# Patient Record
Sex: Female | Born: 1950
Health system: Southern US, Community
[De-identification: ages and names within clinical notes are randomized; demographics above are authoritative.]

## PROBLEM LIST (undated history)

## (undated) DIAGNOSIS — W540XXA Bitten by dog, initial encounter: Secondary | ICD-10-CM

## (undated) DIAGNOSIS — M8580 Other specified disorders of bone density and structure, unspecified site: Secondary | ICD-10-CM

## (undated) DIAGNOSIS — N12 Tubulo-interstitial nephritis, not specified as acute or chronic: Secondary | ICD-10-CM

## (undated) DIAGNOSIS — Z87442 Personal history of urinary calculi: Secondary | ICD-10-CM

## (undated) DIAGNOSIS — N2889 Other specified disorders of kidney and ureter: Secondary | ICD-10-CM

## (undated) DIAGNOSIS — S61559A Open bite of unspecified wrist, initial encounter: Secondary | ICD-10-CM

## (undated) DIAGNOSIS — Z923 Personal history of irradiation: Secondary | ICD-10-CM

## (undated) DIAGNOSIS — C419 Malignant neoplasm of bone and articular cartilage, unspecified: Secondary | ICD-10-CM

## (undated) DIAGNOSIS — N189 Chronic kidney disease, unspecified: Secondary | ICD-10-CM

## (undated) DIAGNOSIS — N390 Urinary tract infection, site not specified: Secondary | ICD-10-CM

## (undated) DIAGNOSIS — N159 Renal tubulo-interstitial disease, unspecified: Secondary | ICD-10-CM

## (undated) DIAGNOSIS — IMO0001 Reserved for inherently not codable concepts without codable children: Secondary | ICD-10-CM

## (undated) DIAGNOSIS — C3490 Malignant neoplasm of unspecified part of unspecified bronchus or lung: Secondary | ICD-10-CM

## (undated) DIAGNOSIS — M1990 Unspecified osteoarthritis, unspecified site: Secondary | ICD-10-CM

## (undated) HISTORY — PX: FINGER SURGERY: SHX640

## (undated) HISTORY — DX: Chronic kidney disease, unspecified: N18.9

## (undated) HISTORY — PX: KIDNEY STONE SURGERY: SHX686

---

## 2005-11-14 HISTORY — PX: LITHOTRIPSY: SUR834

## 2006-09-19 ENCOUNTER — Ambulatory Visit (HOSPITAL_COMMUNITY): Admission: RE | Admit: 2006-09-19 | Discharge: 2006-09-19 | Payer: Self-pay | Admitting: Urology

## 2006-10-26 ENCOUNTER — Ambulatory Visit (HOSPITAL_COMMUNITY): Admission: RE | Admit: 2006-10-26 | Discharge: 2006-10-26 | Payer: Self-pay | Admitting: Urology

## 2010-08-11 ENCOUNTER — Ambulatory Visit (HOSPITAL_BASED_OUTPATIENT_CLINIC_OR_DEPARTMENT_OTHER): Admission: RE | Admit: 2010-08-11 | Discharge: 2010-08-11 | Payer: Self-pay | Admitting: Urology

## 2011-01-27 LAB — POCT HEMOGLOBIN-HEMACUE: Hemoglobin: 14.4 g/dL (ref 12.0–15.0)

## 2012-08-16 ENCOUNTER — Telehealth: Payer: Self-pay

## 2012-08-16 NOTE — Telephone Encounter (Signed)
FORMER PT OF Alison Sullivan.  SHE IN THE PAST HAD CHRONIC UTI'S.  FOR A WHILE Alison WOULD PRESCRIBE HER AN ANTIBIOTIC TO TAKE WITH HER WHEN SHE WENT OUT OF TOWN JUST IN CASE SHE WOULD NEED ONE.  SHE IS GOING OUT OF THE COUNTRY IN TWO WEEKS AND WOULD LIKE TO KNOW IF SHE COULD GET ONE TO TAKE WITH HER AGAIN.  SAYS HER LAST COUPLE OF TRIPS SHE DIDN'T EVEN NEED THE PRESCRIPTIONS, BUT NOW THEY ARE OUTDATED.  ALSO SHE WOULD LIKE TO KNOW WHAT IS NECESSARY TO GET A PRESCRIPTION FOR A SHINGLE SHOT.  USES [facility] [address].  [phone]

## 2012-08-16 NOTE — Telephone Encounter (Signed)
Please pull chart.

## 2012-08-16 NOTE — Telephone Encounter (Signed)
I will pull chart, patient not in since we have changed to Epic, She is requesting ABX for travel to prevent UTI, also she is requesting order for shingles vaccine.

## 2012-08-17 NOTE — Telephone Encounter (Signed)
Chart in pile at [facility] desk

## 2012-08-17 NOTE — Telephone Encounter (Signed)
This patient hasn't been seen here in 2 years. Needs an OV for any type of medical treatment, including Rxs.

## 2012-08-17 NOTE — Telephone Encounter (Signed)
Explained to pt that we will need to see her for a check-up to make sure she is healthy in order to prescribe any Rxs for her. Pt stated that it was only an Abx and she didn't have time for an OV. Again, I explained that it is not safe/good medical practice for our providers to Rx anything for her when it has been so long since she has been evaluated.

## 2012-08-28 ENCOUNTER — Encounter: Payer: Self-pay | Admitting: Family Medicine

## 2012-11-20 ENCOUNTER — Ambulatory Visit: Payer: BC Managed Care – PPO | Admitting: Internal Medicine

## 2012-11-20 ENCOUNTER — Ambulatory Visit: Payer: BC Managed Care – PPO

## 2012-11-20 VITALS — BP 112/76 | HR 62 | Temp 97.6°F | Resp 16 | Ht 67.5 in | Wt 134.0 lb

## 2012-11-20 DIAGNOSIS — T148XXA Other injury of unspecified body region, initial encounter: Secondary | ICD-10-CM

## 2012-11-20 DIAGNOSIS — M25539 Pain in unspecified wrist: Secondary | ICD-10-CM

## 2012-11-20 DIAGNOSIS — W540XXA Bitten by dog, initial encounter: Secondary | ICD-10-CM

## 2012-11-20 DIAGNOSIS — S61531A Puncture wound without foreign body of right wrist, initial encounter: Secondary | ICD-10-CM

## 2012-11-20 DIAGNOSIS — S61509A Unspecified open wound of unspecified wrist, initial encounter: Secondary | ICD-10-CM

## 2012-11-20 DIAGNOSIS — Z23 Encounter for immunization: Secondary | ICD-10-CM

## 2012-11-20 MED ORDER — AMOXICILLIN-POT CLAVULANATE 875-125 MG PO TABS: 1.0000 | ORAL_TABLET | Freq: Two times a day (BID) | ORAL | Status: DC

## 2012-11-20 MED ORDER — HYDROCODONE-ACETAMINOPHEN 5-325 MG PO TABS: 1.0000 | ORAL_TABLET | Freq: Four times a day (QID) | ORAL | Status: DC | PRN

## 2012-11-20 NOTE — Progress Notes (Signed)
  Subjective:    Patient ID: Alison Sullivan, female    DOB: 1951-06-20, 62 y.o.   MRN: [id number]  HPI [place] [address], dog hit by a car, seriously injured and when she helped dog out of street dog bit her wrist. Wounds on top and bottom and both bled profusely. Dog utd on shots, dog died from wounds Wrist and hand has full function TD will be given/had tdap 5 yrs ago   Review of Systems healthy    Objective:   Physical Exam  Vitals reviewed. Constitutional: She is oriented to person, place, and time. She appears well-developed and well-nourished. No distress.  Pulmonary/Chest: Effort normal.  Musculoskeletal: She exhibits edema and tenderness.       Right wrist: She exhibits tenderness, swelling and laceration. She exhibits normal range of motion and no bony tenderness.       Arms: Neurological: She is alert and oriented to person, place, and time. No cranial nerve deficit or sensory deficit. She exhibits normal muscle tone. Coordination normal.  Skin: Abrasion, bruising, ecchymosis and laceration noted. There is erythema.     Psychiatric: She has a normal mood and affect.          Assessment & Plan:  Contusion wrist Wounds doral and volar wrist. Wrist splint Augmentin 875/HC

## 2012-11-20 NOTE — Patient Instructions (Signed)
Animal Bite An animal bite can result in a scratch on the skin, deep open cut, puncture of the skin, crush injury, or tearing away of the skin or a body part. Dogs are responsible for most animal bites. Children are bitten more often than adults. An animal bite can range from very mild to more serious. A small bite from your house pet is no cause for alarm. However, some animal bites can become infected or injure a bone or other tissue. You must seek medical care if:  The skin is broken and bleeding does not slow down or stop after 15 minutes.  The puncture is deep and difficult to clean (such as a cat bite).  Pain, warmth, redness, or pus develops around the wound.  The bite is from a stray animal or rodent. There may be a risk of rabies infection.  The bite is from a snake, raccoon, skunk, fox, coyote, or bat. There may be a risk of rabies infection.  The person bitten has a chronic illness such as diabetes, liver disease, or cancer, or the person takes medicine that lowers the immune system.  There is concern about the location and severity of the bite. It is important to clean and protect an animal bite wound right away to prevent infection. Follow these steps:  Clean the wound with plenty of water and soap.  Apply an antibiotic cream.  Apply gentle pressure over the wound with a clean towel or gauze to slow or stop bleeding.  Elevate the affected area above the heart to help stop any bleeding.  Seek medical care. Getting medical care within 8 hours of the animal bite leads to the best possible outcome. DIAGNOSIS  Your caregiver will most likely:  Take a detailed history of the animal and the bite injury.  Perform a wound exam.  Take your medical history. Blood tests or X-rays may be performed. Sometimes, infected bite wounds are cultured and sent to a lab to identify the infectious bacteria.  TREATMENT  Medical treatment will depend on the location and type of animal bite as  well as the patient's medical history. Treatment may include:  Wound care, such as cleaning and flushing the wound with saline solution, bandaging, and elevating the affected area.  Antibiotics.  Tetanus immunization.  Rabies immunization.  Leaving the wound open to heal. This is often done with animal bites, due to the high risk of infection. However, in certain cases, wound closure with stitches, wound adhesive, skin adhesive strips, or staples may be used. Infected bites that are left untreated may require intravenous (IV) antibiotics and surgical treatment in the hospital. HOME CARE INSTRUCTIONS  Follow your caregiver's instructions for wound care.  Take all medicines as directed.  If your caregiver prescribes antibiotics, take them as directed. Finish them even if you start to feel better.  Follow up with your caregiver for further exams or immunizations as directed. You may need a tetanus shot if:  You cannot remember when you had your last tetanus shot.  You have never had a tetanus shot.  The injury broke your skin. If you get a tetanus shot, your arm may swell, get red, and feel warm to the touch. This is common and not a problem. If you need a tetanus shot and you choose not to have one, there is a rare chance of getting tetanus. Sickness from tetanus can be serious. SEEK MEDICAL CARE IF:  You notice warmth, redness, soreness, swelling, pus discharge, or a bad   smell coming from the wound.  You have a red line on the skin coming from the wound.  You have a fever, chills, or a general ill feeling.  You have nausea or vomiting.  You have continued or worsening pain.  You have trouble moving the injured part.  You have other questions or concerns. MAKE SURE YOU:  Understand these instructions.  Will watch your condition.  Will get help right away if you are not doing well or get worse. Document Released: 07-19-202012 Document Revised: 01/23/2012 Document  Reviewed: 07-19-202012 [facility] Patient Information 2013 [place], [place]. Wound Care Wound care helps prevent pain and infection.  You may need a tetanus shot if:  You cannot remember when you had your last tetanus shot.  You have never had a tetanus shot.  The injury broke your skin. If you need a tetanus shot and you choose not to have one, you may get tetanus. Sickness from tetanus can be serious. HOME CARE   Only take medicine as told by your doctor.  Clean the wound daily with mild soap and water.  Change any bandages (dressings) as told by your doctor.  Put medicated cream and a bandage on the wound as told by your doctor.  Change the bandage if it gets wet, dirty, or starts to smell.  Take showers. Do not take baths, swim, or do anything that puts your wound under water.  Rest and raise (elevate) the wound until the pain and puffiness (swelling) are better.  Keep all doctor visits as told. GET HELP RIGHT AWAY IF:   Yellowish-white fluid (pus) comes from the wound.  Medicine does not lessen your pain.  There is a red streak going away from the wound.  You have a fever. MAKE SURE YOU:   Understand these instructions.  Will watch your condition.  Will get help right away if you are not doing well or get worse. Document Released: 08/09/2008 Document Revised: 01/23/2012 Document Reviewed: 03/06/2011 [facility] Patient Information 2013 [place], [place].

## 2012-11-20 NOTE — Progress Notes (Signed)
VCO. Wound cleaned with soap and water.  Superficial wound to right wrist. Edges loosely approximated with #2 steri-strips. Bandaged and splint applied. Patient tolerated well.

## 2012-11-22 NOTE — Progress Notes (Signed)
  Subjective:    Patient ID: Alison Sullivan, female    DOB: 03-08-1951, 62 y.o.   MRN: [id number]  HPI    Review of Systems     Objective:   Physical Exam   [facility] reading (PRIMARY) by  Dr.Azlee Monforte NAD.       Assessment & Plan:

## 2013-08-11 ENCOUNTER — Ambulatory Visit (INDEPENDENT_AMBULATORY_CARE_PROVIDER_SITE_OTHER): Payer: BC Managed Care – PPO | Admitting: Internal Medicine

## 2013-08-11 VITALS — BP 116/80 | HR 68 | Temp 97.7°F | Resp 18 | Ht 66.75 in | Wt 140.6 lb

## 2013-08-11 DIAGNOSIS — N200 Calculus of kidney: Secondary | ICD-10-CM

## 2013-08-11 DIAGNOSIS — N390 Urinary tract infection, site not specified: Secondary | ICD-10-CM

## 2013-08-11 DIAGNOSIS — Z23 Encounter for immunization: Secondary | ICD-10-CM

## 2013-08-11 DIAGNOSIS — R350 Frequency of micturition: Secondary | ICD-10-CM

## 2013-08-11 LAB — POCT URINALYSIS DIPSTICK
Bilirubin, UA: NEGATIVE
Ketones, UA: NEGATIVE
Protein, UA: NEGATIVE

## 2013-08-11 LAB — POCT UA - MICROSCOPIC ONLY

## 2013-08-11 MED ORDER — CIPROFLOXACIN HCL 500 MG PO TABS: 500.0000 mg | ORAL_TABLET | Freq: Two times a day (BID) | ORAL | Status: DC

## 2013-08-11 NOTE — Progress Notes (Signed)
  Subjective:    Patient ID: Alison Sullivan, female    DOB: 06/21/1951, 62 y.o.   MRN: [id number]  HPI think she has a urinary tract infection because she feels sluggish No dysuria or frequency Mild flank pain/aching/left greater than right  Has a history of multiple retained kidney stones UTIs have been frequent over the last 25 years She rarely has dysuria or frequency Last UTI was 2 years ago/after prolonged therapy at that point for 6 months by Dr. Patsi Sears she has been free of infections No recent stones  No chills or fever No abdominal pain    Review of Systems     Objective:   Physical Exam BP 116/80  Pulse 68  Temp(Src) 97.7 F (36.5 C) (Oral)  Resp 18  Ht 5' 6.75" (1.695 m)  Wt 140 lb 9.6 oz (63.776 kg)  BMI 22.2 kg/m2  SpO2 99% No acute distress No abdominal tenderness No CVA tenderness   Results for orders placed in visit on 08/11/13  POCT UA - MICROSCOPIC ONLY      Result Value Range   WBC, Ur, HPF, POC tntc     RBC, urine, microscopic 1-2     Bacteria, U Microscopic 3+     Mucus, UA neg     Epithelial cells, urine per micros 0-1     Crystals, Ur, HPF, POC neg     Casts, Ur, LPF, POC neg     Yeast, UA neg    POCT URINALYSIS DIPSTICK      Result Value Range   Color, UA yellow     Clarity, UA clear     Glucose, UA neg     Bilirubin, UA neg     Ketones, UA neg     Spec Grav, UA 1.015     Blood, UA small     pH, UA 7.0     Protein, UA neg     Urobilinogen, UA 0.2     Nitrite, UA neg     Leukocytes, UA moderate (2+)         Assessment & Plan:  Frequent urination - Plan: POCT UA - Microscopic Only, POCT urinalysis dipstick  Need for prophylactic vaccination and inoculation against influenza - Plan: Flu Vaccine QUAD 36+ mos PF IM (Fluarix)  UTI (urinary tract infection) - Plan: Urine culture  Meds ordered this encounter  Medications  . ciprofloxacin (CIPRO) 500 MG tablet    Sig: Take 1 tablet (500 mg total) by mouth 2 (two) times  daily.    Dispense:  20 tablet    Refill:  0   Urine culture

## 2013-08-14 ENCOUNTER — Encounter: Payer: Self-pay | Admitting: Internal Medicine

## 2014-09-30 ENCOUNTER — Ambulatory Visit (INDEPENDENT_AMBULATORY_CARE_PROVIDER_SITE_OTHER): Payer: 59 | Admitting: Physician Assistant

## 2014-09-30 VITALS — BP 116/72 | HR 75 | Temp 98.2°F | Resp 18 | Ht 67.0 in | Wt 151.0 lb

## 2014-09-30 DIAGNOSIS — Z23 Encounter for immunization: Secondary | ICD-10-CM

## 2014-09-30 NOTE — Progress Notes (Signed)
Needs Flu vaccine.  D/w pt indications for Prevnar 13 at 63 y/o due to her good health and no co-morbidities.

## 2014-11-05 ENCOUNTER — Ambulatory Visit (INDEPENDENT_AMBULATORY_CARE_PROVIDER_SITE_OTHER): Payer: 59 | Admitting: Family Medicine

## 2014-11-05 VITALS — BP 114/68 | HR 75 | Temp 98.1°F | Resp 16 | Ht 67.0 in | Wt 154.0 lb

## 2014-11-05 DIAGNOSIS — R300 Dysuria: Secondary | ICD-10-CM

## 2014-11-05 DIAGNOSIS — N3000 Acute cystitis without hematuria: Secondary | ICD-10-CM

## 2014-11-05 LAB — POCT UA - MICROSCOPIC ONLY
CASTS, UR, LPF, POC: NEGATIVE
Crystals, Ur, HPF, POC: NEGATIVE
Mucus, UA: NEGATIVE
YEAST UA: NEGATIVE

## 2014-11-05 LAB — POCT URINALYSIS DIPSTICK
BILIRUBIN UA: NEGATIVE
GLUCOSE UA: NEGATIVE
KETONES UA: NEGATIVE
Nitrite, UA: NEGATIVE
PH UA: 6.5
Protein, UA: NEGATIVE
Spec Grav, UA: <=1.005
Urobilinogen, UA: 0.2

## 2014-11-05 MED ORDER — CIPROFLOXACIN HCL 500 MG PO TABS: 500.0000 mg | ORAL_TABLET | Freq: Two times a day (BID) | ORAL | Status: DC

## 2014-11-05 NOTE — Progress Notes (Signed)
Subjective:  This chart was scribed for Alison Ray, MD by Alison Sullivan, Medical Scribe. This patient was seen in Room 14 and the patient's care was started at 12:30 PM.    Patient ID: Alison Sullivan, female    DOB: 06/12/1951, 63 y.o.   MRN: [id number]  Chief Complaint  Patient presents with  . Urinary Tract Infection    HPI HPI Comments: Alison Sullivan is a 63 y.o. female who presents to the [facility] complaining of a possible UTI. Pt was seen for a UTI on September 2014 by Dr. Laney Sullivan. Treated at that time with Cipro. Seen by Urologist Dr. Gaynelle Sullivan in the past. Has h/o kidney stones. Last UTI was proteus in 2014. Pt states she had an UTI in the past and she always knows her body and how she feels when she is getting one. She states she began to feel like she had a UTI yesterday. She denies any fever, urinary frequency, dysuria, hematuria, flank pain or any other urinary symptoms. Pt states the last time she had issues with her frequent kidney stone problem was around 3 years ago. She states she has not had any kidney stone movement for 3-4 months  Patient Active Problem List   Diagnosis Date Noted  . Nephrolithiasis 08/11/2013   Past Medical History  Diagnosis Date  . Chronic kidney disease    No past surgical history on file. No Known Allergies Prior to Admission medications   Not on File   History   Social History  . Marital Status: Single    Spouse Name: N/A    Number of Children: N/A  . Years of Education: N/A   Occupational History  . Not on file.   Social History Main Topics  . Smoking status: Current Every Day Smoker -- 0.50 packs/day    Types: Cigarettes  . Smokeless tobacco: Not on file  . Alcohol Use: Yes  . Drug Use: No  . Sexual Activity: Not on file   Other Topics Concern  . Not on file   Social History Narrative     Review of Systems  Constitutional: Negative for fever.  Genitourinary: Negative for  dysuria, frequency, hematuria, flank pain and difficulty urinating.  All other systems reviewed and are negative.      Objective:   Physical Exam  Constitutional: She is oriented to person, place, and time. She appears well-developed and well-nourished. No distress.  HENT:  Head: Normocephalic and atraumatic.  Eyes: Conjunctivae and EOM are normal.  Neck: Neck supple. No tracheal deviation present.  Cardiovascular: Normal rate.   Pulmonary/Chest: Effort normal. No respiratory distress.  Abdominal: Soft. There is no tenderness. There is no CVA tenderness.  Musculoskeletal: Normal range of motion.  Neurological: She is alert and oriented to person, place, and time.  Skin: Skin is warm and dry.  Psychiatric: She has a normal mood and affect. Her behavior is normal.  Nursing note and vitals reviewed.    Filed Vitals:   11/05/14 1132  BP: 114/68  Pulse: 75  Temp: 98.1 F (36.7 C)  TempSrc: Oral  Resp: 16  Height: 5\' 7"  (1.702 m)  Weight: 154 lb (69.854 kg)  SpO2: 96%   Results for orders placed or performed in visit on 11/05/14  POCT UA - Microscopic Only  Result Value Ref Range   WBC, Ur, HPF, POC 15-20    RBC, urine, microscopic 0-2    Bacteria, U Microscopic trace    Mucus,  UA neg    Epithelial cells, urine per micros 1-4    Crystals, Ur, HPF, POC neg    Casts, Ur, LPF, POC neg    Yeast, UA neg   POCT urinalysis dipstick  Result Value Ref Range   Color, UA yellow    Clarity, UA clear    Glucose, UA neg    Bilirubin, UA neg    Ketones, UA neg    Spec Grav, UA <=1.005    Blood, UA small    pH, UA 6.5    Protein, UA neg    Urobilinogen, UA 0.2    Nitrite, UA neg    Leukocytes, UA moderate (2+)       Assessment & Plan:   Alison Sullivan is a 63 y.o. female Dysuria - Plan: POCT UA - Microscopic Only, POCT urinalysis dipstick  Acute cystitis without hematuria - Plan: ciprofloxacin (CIPRO) 500 MG tablet, Urine culture  -start Cipro, urine culture, push  fluids and RTC precautions discussed.   Meds ordered this encounter  Medications  . ciprofloxacin (CIPRO) 500 MG tablet    Sig: Take 1 tablet (500 mg total) by mouth 2 (two) times daily.    Dispense:  20 tablet    Refill:  0   Patient Instructions  You should receive a call or letter about your lab results within the next week to 10 days.  Return to the clinic or go to the nearest emergency room if any of your symptoms worsen or new symptoms occur.  Urinary Tract Infection Urinary tract infections (UTIs) can develop anywhere along your urinary tract. Your urinary tract is your body's drainage system for removing wastes and extra water. Your urinary tract includes two kidneys, two ureters, a bladder, and a urethra. Your kidneys are a pair of bean-shaped organs. Each kidney is about the size of your fist. They are located below your ribs, one on each side of your spine. CAUSES Infections are caused by microbes, which are microscopic organisms, including fungi, viruses, and bacteria. These organisms are so small that they can only be seen through a microscope. Bacteria are the microbes that most commonly cause UTIs. SYMPTOMS  Symptoms of UTIs may vary by age and gender of the patient and by the location of the infection. Symptoms in young women typically include a frequent and intense urge to urinate and a painful, burning feeling in the bladder or urethra during urination. Older women and men are more likely to be tired, shaky, and weak and have muscle aches and abdominal pain. A fever may mean the infection is in your kidneys. Other symptoms of a kidney infection include pain in your back or sides below the ribs, nausea, and vomiting. DIAGNOSIS To diagnose a UTI, your caregiver will ask you about your symptoms. Your caregiver also will ask to provide a urine sample. The urine sample will be tested for bacteria and white blood cells. White blood cells are made by your body to help fight  infection. TREATMENT  Typically, UTIs can be treated with medication. Because most UTIs are caused by a bacterial infection, they usually can be treated with the use of antibiotics. The choice of antibiotic and length of treatment depend on your symptoms and the type of bacteria causing your infection. HOME CARE INSTRUCTIONS  If you were prescribed antibiotics, take them exactly as your caregiver instructs you. Finish the medication even if you feel better after you have only taken some of the medication.  Drink enough water  and fluids to keep your urine clear or pale yellow.  Avoid caffeine, tea, and carbonated beverages. They tend to irritate your bladder.  Empty your bladder often. Avoid holding urine for long periods of time.  Empty your bladder before and after sexual intercourse.  After a bowel movement, women should cleanse from front to back. Use each tissue only once. SEEK MEDICAL CARE IF:   You have back pain.  You develop a fever.  Your symptoms do not begin to resolve within 3 days. SEEK IMMEDIATE MEDICAL CARE IF:   You have severe back pain or lower abdominal pain.  You develop chills.  You have nausea or vomiting.  You have continued burning or discomfort with urination. MAKE SURE YOU:   Understand these instructions.  Will watch your condition.  Will get help right away if you are not doing well or get worse. Document Released: 08/10/2005 Document Revised: 05/01/2012 Document Reviewed: 12/09/2011 [facility] Patient Information 2015 [place], [place]. This information is not intended to replace advice given to you by your health care provider. Make sure you discuss any questions you have with your health care provider.     I personally performed the services described in this documentation, which was scribed in my presence. The recorded information has been reviewed and considered, and addended by me as needed.

## 2014-11-05 NOTE — Patient Instructions (Signed)
You should receive a call or letter about your lab results within the next week to 10 days.  Return to the clinic or go to the nearest emergency room if any of your symptoms worsen or new symptoms occur.  Urinary Tract Infection Urinary tract infections (UTIs) can develop anywhere along your urinary tract. Your urinary tract is your body's drainage system for removing wastes and extra water. Your urinary tract includes two kidneys, two ureters, a bladder, and a urethra. Your kidneys are a pair of bean-shaped organs. Each kidney is about the size of your fist. They are located below your ribs, one on each side of your spine. CAUSES Infections are caused by microbes, which are microscopic organisms, including fungi, viruses, and bacteria. These organisms are so small that they can only be seen through a microscope. Bacteria are the microbes that most commonly cause UTIs. SYMPTOMS  Symptoms of UTIs may vary by age and gender of the patient and by the location of the infection. Symptoms in young women typically include a frequent and intense urge to urinate and a painful, burning feeling in the bladder or urethra during urination. Older women and men are more likely to be tired, shaky, and weak and have muscle aches and abdominal pain. A fever may mean the infection is in your kidneys. Other symptoms of a kidney infection include pain in your back or sides below the ribs, nausea, and vomiting. DIAGNOSIS To diagnose a UTI, your caregiver will ask you about your symptoms. Your caregiver also will ask to provide a urine sample. The urine sample will be tested for bacteria and white blood cells. White blood cells are made by your body to help fight infection. TREATMENT  Typically, UTIs can be treated with medication. Because most UTIs are caused by a bacterial infection, they usually can be treated with the use of antibiotics. The choice of antibiotic and length of treatment depend on your symptoms and the type  of bacteria causing your infection. HOME CARE INSTRUCTIONS  If you were prescribed antibiotics, take them exactly as your caregiver instructs you. Finish the medication even if you feel better after you have only taken some of the medication.  Drink enough water and fluids to keep your urine clear or pale yellow.  Avoid caffeine, tea, and carbonated beverages. They tend to irritate your bladder.  Empty your bladder often. Avoid holding urine for long periods of time.  Empty your bladder before and after sexual intercourse.  After a bowel movement, women should cleanse from front to back. Use each tissue only once. SEEK MEDICAL CARE IF:   You have back pain.  You develop a fever.  Your symptoms do not begin to resolve within 3 days. SEEK IMMEDIATE MEDICAL CARE IF:   You have severe back pain or lower abdominal pain.  You develop chills.  You have nausea or vomiting.  You have continued burning or discomfort with urination. MAKE SURE YOU:   Understand these instructions.  Will watch your condition.  Will get help right away if you are not doing well or get worse. Document Released: 08/10/2005 Document Revised: 05/01/2012 Document Reviewed: 12/09/2011 [facility] Patient Information 2015 [place], [place]. This information is not intended to replace advice given to you by your health care provider. Make sure you discuss any questions you have with your health care provider.

## 2014-11-08 LAB — URINE CULTURE

## 2014-12-29 ENCOUNTER — Ambulatory Visit: Payer: Self-pay | Admitting: Internal Medicine

## 2015-01-01 ENCOUNTER — Encounter: Payer: Self-pay | Admitting: Internal Medicine

## 2015-01-01 ENCOUNTER — Other Ambulatory Visit (INDEPENDENT_AMBULATORY_CARE_PROVIDER_SITE_OTHER): Payer: 59

## 2015-01-01 ENCOUNTER — Ambulatory Visit (INDEPENDENT_AMBULATORY_CARE_PROVIDER_SITE_OTHER): Payer: 59 | Admitting: Internal Medicine

## 2015-01-01 VITALS — BP 118/76 | HR 80 | Temp 97.7°F | Resp 18 | Ht 66.0 in | Wt 157.0 lb

## 2015-01-01 DIAGNOSIS — R7989 Other specified abnormal findings of blood chemistry: Secondary | ICD-10-CM

## 2015-01-01 DIAGNOSIS — Z0000 Encounter for general adult medical examination without abnormal findings: Secondary | ICD-10-CM

## 2015-01-01 DIAGNOSIS — G4700 Insomnia, unspecified: Secondary | ICD-10-CM

## 2015-01-01 DIAGNOSIS — Z23 Encounter for immunization: Secondary | ICD-10-CM

## 2015-01-01 LAB — LIPID PANEL
CHOL/HDL RATIO: 6
CHOLESTEROL: 237 mg/dL — AB (ref 0–200)
HDL: 37.1 mg/dL — ABNORMAL LOW (ref >39.00)
NonHDL: 199.9
TRIGLYCERIDES: 242 mg/dL — AB (ref 0.0–149.0)
VLDL: 48.4 mg/dL — AB (ref 0.0–40.0)

## 2015-01-01 LAB — BASIC METABOLIC PANEL
BUN: 17 mg/dL (ref 6–23)
CALCIUM: 9.4 mg/dL (ref 8.4–10.5)
CO2: 30 mEq/L (ref 19–32)
CREATININE: 0.73 mg/dL (ref 0.40–1.20)
Chloride: 105 mEq/L (ref 96–112)
GFR: 85.38 mL/min (ref >60.00)
Glucose, Bld: 113 mg/dL — ABNORMAL HIGH (ref 70–99)
Potassium: 3.7 mEq/L (ref 3.5–5.1)
SODIUM: 140 meq/L (ref 135–145)

## 2015-01-01 LAB — CBC
HCT: 48.3 % — ABNORMAL HIGH (ref 36.0–46.0)
HEMOGLOBIN: 16.6 g/dL — AB (ref 12.0–15.0)
MCHC: 34.3 g/dL (ref 30.0–36.0)
MCV: 90.2 fl (ref 78.0–100.0)
PLATELETS: 314 10*3/uL (ref 150.0–400.0)
RBC: 5.36 Mil/uL — AB (ref 3.87–5.11)
RDW: 13 % (ref 11.5–15.5)
WBC: 9.7 10*3/uL (ref 4.0–10.5)

## 2015-01-01 LAB — LDL CHOLESTEROL, DIRECT: Direct LDL: 157 mg/dL

## 2015-01-01 NOTE — Progress Notes (Signed)
Pre visit review using our clinic review tool, if applicable. No additional management support is needed unless otherwise documented below in the visit note. 

## 2015-01-01 NOTE — Patient Instructions (Addendum)
We will check your blood work today to make sure we are not missing anything.  For the sleep we recommend melatonin as the most natural thing to try first. It helps to restore natural sleep and can take 1-2 weeks for you to feel the full effects. The other herbal medicine that seems to help with sleep is valerian.   We have also given you some information about sleep including ways to naturally help restore your own sleep.   Come back in about 1 year for a physical. If you have any problems or questions before then please call our office.   Think about getting a colon cancer screening to check for colon cancer. Also think about stopping smoking as this is one of the best things you can do for your heart, lungs and body to reduce your risk of high blood pressure, heart attack, stroke.   Health Maintenance Adopting a healthy lifestyle and getting preventive care can go a long way to promote health and wellness. Talk with your health care provider about what schedule of regular examinations is right for you. This is a good chance for you to check in with your provider about disease prevention and staying healthy. In between checkups, there are plenty of things you can do on your own. Experts have done a lot of research about which lifestyle changes and preventive measures are most likely to keep you healthy. Ask your health care provider for more information. WEIGHT AND DIET  Eat a healthy diet  Be sure to include plenty of vegetables, fruits, low-fat dairy products, and lean protein.  Do not eat a lot of foods high in solid fats, added sugars, or salt.  Get regular exercise. This is one of the most important things you can do for your health.  Most adults should exercise for at least 150 minutes each week. The exercise should increase your heart rate and make you sweat (moderate-intensity exercise).  Most adults should also do strengthening exercises at least twice a week. This is in addition  to the moderate-intensity exercise.  Maintain a healthy weight  Body mass index (BMI) is a measurement that can be used to identify possible weight problems. It estimates body fat based on height and weight. Your health care provider can help determine your BMI and help you achieve or maintain a healthy weight.  For females 2 years of age and older:   A BMI below 18.5 is considered underweight.  A BMI of 18.5 to 24.9 is normal.  A BMI of 25 to 29.9 is considered overweight.  A BMI of 30 and above is considered obese.  Watch levels of cholesterol and blood lipids  You should start having your blood tested for lipids and cholesterol at 64 years of age, then have this test every 5 years.  You may need to have your cholesterol levels checked more often if:  Your lipid or cholesterol levels are high.  You are older than 64 years of age.  You are at high risk for heart disease.  CANCER SCREENING   Lung Cancer  Lung cancer screening is recommended for adults 56-61 years old who are at high risk for lung cancer because of a history of smoking.  A yearly low-dose CT scan of the lungs is recommended for people who:  Currently smoke.  Have quit within the past 15 years.  Have at least a 30-pack-year history of smoking. A pack year is smoking an average of one pack of cigarettes  a day for 1 year.  Yearly screening should continue until it has been 15 years since you quit.  Yearly screening should stop if you develop a health problem that would prevent you from having lung cancer treatment.  Breast Cancer  Practice breast self-awareness. This means understanding how your breasts normally appear and feel.  It also means doing regular breast self-exams. Let your health care provider know about any changes, no matter how small.  If you are in your 20s or 30s, you should have a clinical breast exam (CBE) by a health care provider every 1-3 years as part of a regular health  exam.  If you are 86 or older, have a CBE every year. Also consider having a breast X-ray (mammogram) every year.  If you have a family history of breast cancer, talk to your health care provider about genetic screening.  If you are at high risk for breast cancer, talk to your health care provider about having an MRI and a mammogram every year.  Breast cancer gene (BRCA) assessment is recommended for women who have family members with BRCA-related cancers. BRCA-related cancers include:  Breast.  Ovarian.  Tubal.  Peritoneal cancers.  Results of the assessment will determine the need for genetic counseling and BRCA1 and BRCA2 testing. Cervical Cancer Routine pelvic examinations to screen for cervical cancer are no longer recommended for nonpregnant women who are considered low risk for cancer of the pelvic organs (ovaries, uterus, and vagina) and who do not have symptoms. A pelvic examination may be necessary if you have symptoms including those associated with pelvic infections. Ask your health care provider if a screening pelvic exam is right for you.   The Pap test is the screening test for cervical cancer for women who are considered at risk.  If you had a hysterectomy for a problem that was not cancer or a condition that could lead to cancer, then you no longer need Pap tests.  If you are older than 65 years, and you have had normal Pap tests for the past 10 years, you no longer need to have Pap tests.  If you have had past treatment for cervical cancer or a condition that could lead to cancer, you need Pap tests and screening for cancer for at least 20 years after your treatment.  If you no longer get a Pap test, assess your risk factors if they change (such as having a new sexual partner). This can affect whether you should start being screened again.  Some women have medical problems that increase their chance of getting cervical cancer. If this is the case for you, your health  care provider may recommend more frequent screening and Pap tests.  The human papillomavirus (HPV) test is another test that may be used for cervical cancer screening. The HPV test looks for the virus that can cause cell changes in the cervix. The cells collected during the Pap test can be tested for HPV.  The HPV test can be used to screen women 76 years of age and older. Getting tested for HPV can extend the interval between normal Pap tests from three to five years.  An HPV test also should be used to screen women of any age who have unclear Pap test results.  After 64 years of age, women should have HPV testing as often as Pap tests.  Colorectal Cancer  This type of cancer can be detected and often prevented.  Routine colorectal cancer screening usually begins at  64 years of age and continues through 64 years of age.  Your health care provider may recommend screening at an earlier age if you have risk factors for colon cancer.  Your health care provider may also recommend using home test kits to check for hidden blood in the stool.  A small camera at the end of a tube can be used to examine your colon directly (sigmoidoscopy or colonoscopy). This is done to check for the earliest forms of colorectal cancer.  Routine screening usually begins at age 54.  Direct examination of the colon should be repeated every 5-10 years through 64 years of age. However, you may need to be screened more often if early forms of precancerous polyps or small growths are found. Skin Cancer  Check your skin from head to toe regularly.  Tell your health care provider about any new moles or changes in moles, especially if there is a change in a mole's shape or color.  Also tell your health care provider if you have a mole that is larger than the size of a pencil eraser.  Always use sunscreen. Apply sunscreen liberally and repeatedly throughout the day.  Protect yourself by wearing long sleeves, pants, a  wide-brimmed hat, and sunglasses whenever you are outside. HEART DISEASE, DIABETES, AND HIGH BLOOD PRESSURE   Have your blood pressure checked at least every 1-2 years. High blood pressure causes heart disease and increases the risk of stroke.  If you are between 70 years and 79 years old, ask your health care provider if you should take aspirin to prevent strokes.  Have regular diabetes screenings. This involves taking a blood sample to check your fasting blood sugar level.  If you are at a normal weight and have a low risk for diabetes, have this test once every three years after 64 years of age.  If you are overweight and have a high risk for diabetes, consider being tested at a younger age or more often. PREVENTING INFECTION  Hepatitis B  If you have a higher risk for hepatitis B, you should be screened for this virus. You are considered at high risk for hepatitis B if:  You were born in a country where hepatitis B is common. Ask your health care provider which countries are considered high risk.  Your parents were born in a high-risk country, and you have not been immunized against hepatitis B (hepatitis B vaccine).  You have HIV or AIDS.  You use needles to inject street drugs.  You live with someone who has hepatitis B.  You have had sex with someone who has hepatitis B.  You get hemodialysis treatment.  You take certain medicines for conditions, including cancer, organ transplantation, and autoimmune conditions. Hepatitis C  Blood testing is recommended for:  Everyone born from [birth year] through 1965.  Anyone with known risk factors for hepatitis C. Sexually transmitted infections (STIs)  You should be screened for sexually transmitted infections (STIs) including gonorrhea and chlamydia if:  You are sexually active and are younger than 64 years of age.  You are older than 64 years of age and your health care provider tells you that you are at risk for this type of  infection.  Your sexual activity has changed since you were last screened and you are at an increased risk for chlamydia or gonorrhea. Ask your health care provider if you are at risk.  If you do not have HIV, but are at risk, it may be recommended that  you take a prescription medicine daily to prevent HIV infection. This is called pre-exposure prophylaxis (PrEP). You are considered at risk if:  You are sexually active and do not regularly use condoms or know the HIV status of your partner(s).  You take drugs by injection.  You are sexually active with a partner who has HIV. Talk with your health care provider about whether you are at high risk of being infected with HIV. If you choose to begin PrEP, you should first be tested for HIV. You should then be tested every 3 months for as long as you are taking PrEP.  PREGNANCY   If you are premenopausal and you may become pregnant, ask your health care provider about preconception counseling.  If you may become pregnant, take 400 to 800 micrograms (mcg) of folic acid every day.  If you want to prevent pregnancy, talk to your health care provider about birth control (contraception). OSTEOPOROSIS AND MENOPAUSE   Osteoporosis is a disease in which the bones lose minerals and strength with aging. This can result in serious bone fractures. Your risk for osteoporosis can be identified using a bone density scan.  If you are 41 years of age or older, or if you are at risk for osteoporosis and fractures, ask your health care provider if you should be screened.  Ask your health care provider whether you should take a calcium or vitamin D supplement to lower your risk for osteoporosis.  Menopause may have certain physical symptoms and risks.  Hormone replacement therapy may reduce some of these symptoms and risks. Talk to your health care provider about whether hormone replacement therapy is right for you.  HOME CARE INSTRUCTIONS   Schedule regular  health, dental, and eye exams.  Stay current with your immunizations.   Do not use any tobacco products including cigarettes, chewing tobacco, or electronic cigarettes.  If you are pregnant, do not drink alcohol.  If you are breastfeeding, limit how much and how often you drink alcohol.  Limit alcohol intake to no more than 1 drink per day for nonpregnant women. One drink equals 12 ounces of beer, 5 ounces of wine, or 1 ounces of hard liquor.  Do not use street drugs.  Do not share needles.  Ask your health care provider for help if you need support or information about quitting drugs.  Tell your health care provider if you often feel depressed.  Tell your health care provider if you have ever been abused or do not feel safe at home. Document Released: 05/16/2011 Document Revised: 03/17/2014 Document Reviewed: 10/02/2013 [facility] Patient Information 2015 [place], [place]. This information is not intended to replace advice given to you by your health care provider. Make sure you discuss any questions you have with your health care provider.

## 2015-01-02 ENCOUNTER — Encounter: Payer: Self-pay | Admitting: Internal Medicine

## 2015-01-02 DIAGNOSIS — Z0000 Encounter for general adult medical examination without abnormal findings: Secondary | ICD-10-CM | POA: Insufficient documentation

## 2015-01-02 DIAGNOSIS — G4700 Insomnia, unspecified: Secondary | ICD-10-CM | POA: Insufficient documentation

## 2015-01-02 NOTE — Assessment & Plan Note (Signed)
Likely due to circadian rhythm problem. Can trial melatonin first and given suggestions for sleep hygiene.

## 2015-01-02 NOTE — Assessment & Plan Note (Addendum)
Has not had colon cancer screening and she does not want as she is not having problems. Will talk to her about it again next visit. She does not want repeat mammogram right now but asked her to think about this. She is up to date on immunizations including flu, shingles, tetanus. Check blood work. Smoker and talked to her about quitting and reminded about the risks and harms. She is trying to cut back and wants to quit. She does not want help to try quitting.

## 2015-01-02 NOTE — Progress Notes (Signed)
   Subjective:    Patient ID: Alison Sullivan, female    DOB: 01-02-1951, 64 y.o.   MRN: [id number]  HPI The patient is a 64 YO female who is coming in for wellness. She has only mild insomnia which she attributes to being a night owl for most of her life. She does sleep during the day sometimes which messes up her sleeping at night time. She takes no medicines and has no allergies. She has past history of kidney stones and has UTIs sometimes which turn in pyelonephritis quickly in the past.   PMH, [facility], social history, allergies, medications, problem list reviewed and updated.   Review of Systems  Constitutional: Negative for fever, activity change, appetite change, fatigue and unexpected weight change.  HENT: Negative.   Eyes: Negative.   Respiratory: Negative for cough, chest tightness, shortness of breath and wheezing.   Cardiovascular: Negative for chest pain, palpitations and leg swelling.  Gastrointestinal: Negative for abdominal pain, diarrhea, constipation and abdominal distention.  Endocrine: Negative.   Musculoskeletal: Negative.   Skin: Negative.   Neurological: Negative.   Psychiatric/Behavioral: Positive for sleep disturbance.      Objective:   Physical Exam  Constitutional: She is oriented to person, place, and time. She appears well-developed and well-nourished.  HENT:  Head: Normocephalic and atraumatic.  Eyes: EOM are normal.  Neck: Normal range of motion.  Cardiovascular: Normal rate and regular rhythm.   Pulmonary/Chest: Effort normal and breath sounds normal. No respiratory distress. She has no wheezes. She has no rales.  Abdominal: Soft. Bowel sounds are normal. She exhibits no distension. There is no tenderness.  Musculoskeletal: She exhibits no edema.  Neurological: She is alert and oriented to person, place, and time. Coordination normal.  Skin: Skin is warm and dry.  Psychiatric: She has a normal mood and affect. Her behavior is normal.   Filed Vitals:     01/01/15 0928  BP: 118/76  Pulse: 80  Temp: 97.7 F (36.5 C)  TempSrc: Oral  Resp: 18  Height: 5\' 6"  (1.676 m)  Weight: 157 lb (71.215 kg)  SpO2: 94%      Assessment & Plan:  Given pneumonia 23 today.

## 2015-05-11 ENCOUNTER — Telehealth: Payer: Self-pay | Admitting: Internal Medicine

## 2015-05-11 DIAGNOSIS — N200 Calculus of kidney: Secondary | ICD-10-CM

## 2015-05-11 NOTE — Telephone Encounter (Signed)
Can you make sure this gets sent as soon as possible.

## 2015-05-11 NOTE — Telephone Encounter (Signed)
Patient is in pain and is sick.  States she believes she has a kidney stone.  Tried to get in with [facility] with Dr. Gaynelle Arabian.  Patient states she regularly sees Dr. Gaynelle Arabian for kidney issues.  [facility] will not see patient until they get a referral.  Patient is requesting a referral to be sent over to [facility].

## 2015-05-11 NOTE — Telephone Encounter (Signed)
Mary completed this referral.

## 2015-05-11 NOTE — Telephone Encounter (Signed)
Placed.

## 2015-07-07 ENCOUNTER — Telehealth: Payer: Self-pay

## 2015-07-07 NOTE — Telephone Encounter (Signed)
Pt requested that I call back.

## 2015-08-18 ENCOUNTER — Encounter: Payer: Self-pay | Admitting: Internal Medicine

## 2015-08-18 ENCOUNTER — Other Ambulatory Visit (INDEPENDENT_AMBULATORY_CARE_PROVIDER_SITE_OTHER): Payer: 59

## 2015-08-18 ENCOUNTER — Ambulatory Visit (INDEPENDENT_AMBULATORY_CARE_PROVIDER_SITE_OTHER): Payer: 59 | Admitting: Internal Medicine

## 2015-08-18 VITALS — BP 130/80 | HR 85 | Temp 98.7°F | Wt 154.0 lb

## 2015-08-18 DIAGNOSIS — N390 Urinary tract infection, site not specified: Secondary | ICD-10-CM | POA: Insufficient documentation

## 2015-08-18 DIAGNOSIS — N10 Acute tubulo-interstitial nephritis: Secondary | ICD-10-CM | POA: Diagnosis not present

## 2015-08-18 LAB — URINALYSIS, ROUTINE W REFLEX MICROSCOPIC
Bilirubin Urine: NEGATIVE
Ketones, ur: NEGATIVE
Nitrite: POSITIVE — AB
SPECIFIC GRAVITY, URINE: 1.02 (ref 1.000–1.030)
Total Protein, Urine: 30 — AB
UROBILINOGEN UA: 0.2 (ref 0.0–1.0)
Urine Glucose: NEGATIVE
pH: 6 (ref 5.0–8.0)

## 2015-08-18 MED ORDER — CIPROFLOXACIN HCL 500 MG PO TABS: 500.0000 mg | ORAL_TABLET | Freq: Two times a day (BID) | ORAL | Status: DC

## 2015-08-18 NOTE — Progress Notes (Signed)
Subjective:  Patient ID: Alison Sullivan, female    DOB: [birth year]  Age: 64 y.o. MRN: [id number]  CC: Flank Pain   HPI Alison Sullivan presents for a "kidney infection". H/o UTIs, stones.  No outpatient prescriptions prior to visit.   No facility-administered medications prior to visit.    ROS Review of Systems  Constitutional: Negative for chills, activity change, appetite change, fatigue and unexpected weight change.  HENT: Negative for congestion, mouth sores and sinus pressure.   Eyes: Negative for visual disturbance.  Respiratory: Negative for cough and chest tightness.   Gastrointestinal: Negative for nausea and abdominal pain.  Genitourinary: Positive for dysuria, urgency and flank pain. Negative for frequency, difficulty urinating and vaginal pain.  Musculoskeletal: Negative for back pain and gait problem.  Skin: Negative for pallor and rash.  Neurological: Negative for dizziness, tremors, weakness, numbness and headaches.  Psychiatric/Behavioral: Negative for confusion and sleep disturbance.    Objective:  BP 130/80 mmHg  Pulse 85  Temp(Src) 98.7 F (37.1 C) (Oral)  Wt 154 lb (69.854 kg)  SpO2 95%  BP Readings from Last 3 Encounters:  08/18/15 130/80  01/01/15 118/76  11/05/14 114/68    Wt Readings from Last 3 Encounters:  08/18/15 154 lb (69.854 kg)  01/01/15 157 lb (71.215 kg)  11/05/14 154 lb (69.854 kg)    Physical Exam  Constitutional: She appears well-developed. No distress.  HENT:  Head: Normocephalic.  Right Ear: External ear normal.  Left Ear: External ear normal.  Nose: Nose normal.  Mouth/Throat: Oropharynx is clear and moist.  Eyes: Conjunctivae are normal. Pupils are equal, round, and reactive to light. Right eye exhibits no discharge. Left eye exhibits no discharge.  Neck: Normal range of motion. Neck supple. No JVD present. No tracheal deviation present. No thyromegaly present.  Cardiovascular: Normal rate, regular rhythm and  normal heart sounds.   Pulmonary/Chest: No stridor. No respiratory distress. She has no wheezes.  Abdominal: Soft. Bowel sounds are normal. She exhibits no distension and no mass. There is no tenderness. There is no rebound and no guarding.  Musculoskeletal: She exhibits no edema or tenderness.  Lymphadenopathy:    She has no cervical adenopathy.  Neurological: She displays normal reflexes. No cranial nerve deficit. She exhibits normal muscle tone. Coordination normal.  Skin: No rash noted. No erythema.  Psychiatric: She has a normal mood and affect. Her behavior is normal. Judgment and thought content normal.    Lab Results  Component Value Date   WBC 9.7 01/01/2015   HGB 16.6* 01/01/2015   HCT 48.3* 01/01/2015   PLT 314.0 01/01/2015   GLUCOSE 113* 01/01/2015   CHOL 237* 01/01/2015   TRIG 242.0* 01/01/2015   HDL 37.10* 01/01/2015   LDLDIRECT 157.0 01/01/2015   NA 140 01/01/2015   K 3.7 01/01/2015   CL 105 01/01/2015   CREATININE 0.73 01/01/2015   BUN 17 01/01/2015   CO2 30 01/01/2015    No results found.  Assessment & Plan:   Sanika was seen today for flank pain.  Diagnoses and all orders for this visit:  Acute pyelonephritis -     Urinalysis; Future -     CULTURE, URINE COMPREHENSIVE; Future  Other orders -     ciprofloxacin (CIPRO) 500 MG tablet; Take 1 tablet (500 mg total) by mouth 2 (two) times daily.  I am having [name] start on ciprofloxacin.  Meds ordered this encounter  Medications  . ciprofloxacin (CIPRO) 500 MG tablet    Sig:  Take 1 tablet (500 mg total) by mouth 2 (two) times daily.    Dispense:  20 tablet    Refill:  0     Follow-up: Return in about 6 weeks (around 09/29/2015) for a follow-up visit.  Walker Kehr, MD

## 2015-08-18 NOTE — Assessment & Plan Note (Signed)
UA Cipro po F/u w/Dr Doug Sou

## 2015-08-18 NOTE — Progress Notes (Signed)
Pre visit review using our clinic review tool, if applicable. No additional management support is needed unless otherwise documented below in the visit note. 

## 2015-08-20 LAB — CULTURE, URINE COMPREHENSIVE

## 2016-01-04 ENCOUNTER — Encounter: Payer: Self-pay | Admitting: Internal Medicine

## 2016-01-04 ENCOUNTER — Ambulatory Visit (INDEPENDENT_AMBULATORY_CARE_PROVIDER_SITE_OTHER): Payer: BLUE CROSS/BLUE SHIELD | Admitting: Internal Medicine

## 2016-01-04 ENCOUNTER — Other Ambulatory Visit (INDEPENDENT_AMBULATORY_CARE_PROVIDER_SITE_OTHER): Payer: BLUE CROSS/BLUE SHIELD

## 2016-01-04 ENCOUNTER — Telehealth: Payer: Self-pay | Admitting: Internal Medicine

## 2016-01-04 VITALS — BP 122/84 | HR 85 | Temp 98.1°F | Resp 16 | Ht 66.0 in | Wt 157.2 lb

## 2016-01-04 DIAGNOSIS — Z23 Encounter for immunization: Secondary | ICD-10-CM | POA: Diagnosis not present

## 2016-01-04 DIAGNOSIS — R7989 Other specified abnormal findings of blood chemistry: Secondary | ICD-10-CM | POA: Diagnosis not present

## 2016-01-04 DIAGNOSIS — Z0000 Encounter for general adult medical examination without abnormal findings: Secondary | ICD-10-CM

## 2016-01-04 LAB — LIPID PANEL
CHOL/HDL RATIO: 7
Cholesterol: 237 mg/dL — ABNORMAL HIGH (ref 0–200)
HDL: 32.7 mg/dL — ABNORMAL LOW (ref >39.00)
NonHDL: 204.6
Triglycerides: 302 mg/dL — ABNORMAL HIGH (ref 0.0–149.0)
VLDL: 60.4 mg/dL — AB (ref 0.0–40.0)

## 2016-01-04 LAB — COMPREHENSIVE METABOLIC PANEL
ALT: 14 U/L (ref 0–35)
AST: 17 U/L (ref 0–37)
Albumin: 4 g/dL (ref 3.5–5.2)
Alkaline Phosphatase: 83 U/L (ref 39–117)
BUN: 16 mg/dL (ref 6–23)
CO2: 27 meq/L (ref 19–32)
Calcium: 9.1 mg/dL (ref 8.4–10.5)
Chloride: 107 mEq/L (ref 96–112)
Creatinine, Ser: 0.7 mg/dL (ref 0.40–1.20)
GFR: 89.33 mL/min (ref >60.00)
GLUCOSE: 102 mg/dL — AB (ref 70–99)
POTASSIUM: 4.1 meq/L (ref 3.5–5.1)
SODIUM: 140 meq/L (ref 135–145)
Total Bilirubin: 0.5 mg/dL (ref 0.2–1.2)
Total Protein: 7.1 g/dL (ref 6.0–8.3)

## 2016-01-04 LAB — HEPATITIS C ANTIBODY: HCV AB: NEGATIVE

## 2016-01-04 LAB — LDL CHOLESTEROL, DIRECT: Direct LDL: 153 mg/dL

## 2016-01-04 LAB — CBC
HCT: 45.2 % (ref 36.0–46.0)
HEMOGLOBIN: 15.6 g/dL — AB (ref 12.0–15.0)
MCHC: 34.4 g/dL (ref 30.0–36.0)
MCV: 89.8 fl (ref 78.0–100.0)
PLATELETS: 333 10*3/uL (ref 150.0–400.0)
RBC: 5.03 Mil/uL (ref 3.87–5.11)
RDW: 13 % (ref 11.5–15.5)
WBC: 10.9 10*3/uL — ABNORMAL HIGH (ref 4.0–10.5)

## 2016-01-04 LAB — VITAMIN B12: VITAMIN B 12: 245 pg/mL (ref 211–911)

## 2016-01-04 LAB — TSH: TSH: 1.28 u[IU]/mL (ref 0.35–4.50)

## 2016-01-04 MED ORDER — TERBINAFINE HCL 250 MG PO TABS: 250.0000 mg | ORAL_TABLET | Freq: Every day | ORAL | Status: DC

## 2016-01-04 NOTE — Telephone Encounter (Signed)
Pt request to speak to the assistant concern about thing that needed to be update on her chart. Please give her a call back (she wants to speak to the assistant).

## 2016-01-04 NOTE — Patient Instructions (Signed)
We have sent in lamisil which is the anti-fungal medicine that you will start taking after the nail grows out. Take 1 pill daily for 3 months to eradicate the fungus from the nail.  We are checking the labs and will call you back with the results.   We have given you the flu shot today.

## 2016-01-04 NOTE — Progress Notes (Signed)
Pre visit review using our clinic review tool, if applicable. No additional management support is needed unless otherwise documented below in the visit note. 

## 2016-01-05 ENCOUNTER — Encounter: Payer: Self-pay | Admitting: Internal Medicine

## 2016-01-05 NOTE — Telephone Encounter (Signed)
Spoke to patient. She went home and looked in her records. She had her flu shot when she came in to see Dr. Alain Marion on 08-18-15. It was not noted in her chart and when she came in to see Dr. Sharlet Salina on 01-04-16 she got another one. She was mad that we made this mistake. She is not mad that she had two flu shots. It's her concern that something was not noted in her chart and what if it would have been something more serious. I apologized and told her I would inform Dr. Sharlet Salina.

## 2016-01-05 NOTE — Assessment & Plan Note (Addendum)
Will treat the fungus with lamisil. Checking labs and adjust as needed. Flu shot given at visit to update immunizations. Mammogram and colonoscopy up to date.

## 2016-01-05 NOTE — Progress Notes (Signed)
   Subjective:    Patient ID: Alison Sullivan, female    DOB: [birth year], 65 y.o.   MRN: [id number]  HPI The patient is a 65 YO female coming in for wellness. She is using the melatonin and doing well. No new concerns except some nail fungus.   PMH, [facility], social history reviewed and updated.   Review of Systems  Constitutional: Negative for fever, activity change, appetite change, fatigue and unexpected weight change.  HENT: Negative.   Eyes: Negative.   Respiratory: Negative for cough, chest tightness, shortness of breath and wheezing.   Cardiovascular: Negative for chest pain, palpitations and leg swelling.  Gastrointestinal: Negative for abdominal pain, diarrhea, constipation and abdominal distention.  Endocrine: Negative.   Musculoskeletal: Negative.   Skin: Positive for color change.       Nail change  Neurological: Negative.   Psychiatric/Behavioral: Positive for sleep disturbance.       Better      Objective:   Physical Exam  Constitutional: She is oriented to person, place, and time. She appears well-developed and well-nourished.  HENT:  Head: Normocephalic and atraumatic.  Eyes: EOM are normal.  Neck: Normal range of motion.  Cardiovascular: Normal rate and regular rhythm.   Pulmonary/Chest: Effort normal and breath sounds normal. No respiratory distress. She has no wheezes. She has no rales.  Abdominal: Soft. Bowel sounds are normal. She exhibits no distension. There is no tenderness.  Musculoskeletal: She exhibits no edema.  Neurological: She is alert and oriented to person, place, and time. Coordination normal.  Skin: Skin is warm and dry.  Some fungus 3rd right finger  Psychiatric: She has a normal mood and affect. Her behavior is normal.   Filed Vitals:   01/04/16 1259  BP: 122/84  Pulse: 85  Temp: 98.1 F (36.7 C)  TempSrc: Oral  Resp: 16  Height: '5\' 6"'$  (1.676 m)  Weight: 157 lb 3.2 oz (71.305 kg)  SpO2: 96%      Assessment & Plan:  Flu shot given  at visit.

## 2016-03-17 ENCOUNTER — Other Ambulatory Visit: Payer: BLUE CROSS/BLUE SHIELD

## 2016-03-17 ENCOUNTER — Ambulatory Visit (INDEPENDENT_AMBULATORY_CARE_PROVIDER_SITE_OTHER): Payer: BLUE CROSS/BLUE SHIELD | Admitting: Family

## 2016-03-17 VITALS — BP 130/80 | HR 67 | Temp 97.9°F | Ht 66.0 in | Wt 156.5 lb

## 2016-03-17 DIAGNOSIS — R300 Dysuria: Secondary | ICD-10-CM

## 2016-03-17 DIAGNOSIS — R05 Cough: Secondary | ICD-10-CM

## 2016-03-17 DIAGNOSIS — R059 Cough, unspecified: Secondary | ICD-10-CM

## 2016-03-17 DIAGNOSIS — N3000 Acute cystitis without hematuria: Secondary | ICD-10-CM

## 2016-03-17 LAB — POCT URINALYSIS DIPSTICK
BILIRUBIN UA: NEGATIVE
Glucose, UA: NEGATIVE
Ketones, UA: NEGATIVE
NITRITE UA: NEGATIVE
PH UA: 6
Protein, UA: POSITIVE
RBC UA: POSITIVE
SPEC GRAV UA: 1.025
UROBILINOGEN UA: 0.2

## 2016-03-17 MED ORDER — CIPROFLOXACIN HCL 500 MG PO TABS: 500.0000 mg | ORAL_TABLET | Freq: Every day | ORAL | Status: DC

## 2016-03-17 NOTE — Progress Notes (Signed)
Pre visit review using our clinic review tool, if applicable. No additional management support is needed unless otherwise documented below in the visit note. 

## 2016-03-17 NOTE — Patient Instructions (Signed)
Mucinex and Flonase for postnasal drip and cough.  Drink plenty of water and take antibiotic as prescribed.   We are pending the urine culture to know the organism causing the infection and our office will call you with results. If the particular organism requires a different antibiotic than the on prescribed, we will place an order for a new prescription at that time.   If you symptoms worsen or you have new symptoms, please contact our office, or return to clinic for re evaluation.

## 2016-03-17 NOTE — Progress Notes (Signed)
Subjective:    Patient ID: Alison Sullivan, female    DOB: 08-10-1951, 65 y.o.   MRN: [id number]   Alison Sullivan is a 65 y.o. female who presents today for an acute visit.    HPI Comments: Also complains of productive cough for a couple of weeks. Endorses 'sniffles and sneezing'. Denies SOB, chest pain, wheezing. Tried OTC Robitussin. NO h/o allergies.   Dysuria  This is a new problem. The current episode started in the past 7 days. The problem occurs intermittently. The problem has been gradually worsening. The quality of the pain is described as burning. The pain is mild. There has been no fever. She is not sexually active. There is a history of pyelonephritis. Associated symptoms include frequency. Pertinent negatives include no chills, flank pain, nausea or vomiting. She has tried nothing for the symptoms. There is no history of kidney stones or recurrent UTIs.   Past Medical History  Diagnosis Date  . Chronic kidney disease   . UTI (urinary tract infection)    Allergies: Review of patient's allergies indicates no known allergies. Current Outpatient Prescriptions on File Prior to Visit  Medication Sig Dispense Refill  . terbinafine (LAMISIL) 250 MG tablet Take 1 tablet (250 mg total) by mouth daily. 90 tablet 0   No current facility-administered medications on file prior to visit.    Social History  Substance Use Topics  . Smoking status: Current Every Day Smoker -- 0.50 packs/day    Types: Cigarettes  . Smokeless tobacco: Not on file  . Alcohol Use: Yes    Review of Systems  Constitutional: Negative for fever and chills.  HENT: Positive for congestion, postnasal drip and rhinorrhea. Negative for sinus pressure.   Respiratory: Positive for cough. Negative for shortness of breath and wheezing.   Cardiovascular: Negative for chest pain and palpitations.  Gastrointestinal: Negative for nausea and vomiting.  Genitourinary: Positive for dysuria and frequency. Negative  for flank pain.      Objective:    BP 130/80 mmHg  Pulse 67  Temp(Src) 97.9 F (36.6 C) (Oral)  Ht '5\' 6"'$  (1.676 m)  Wt 156 lb 8 oz (70.988 kg)  BMI 25.27 kg/m2  SpO2 97%   Physical Exam  Constitutional: She appears well-developed and well-nourished.  HENT:  Head: Normocephalic and atraumatic.  Right Ear: Hearing, tympanic membrane, external ear and ear canal normal. No drainage, swelling or tenderness. No foreign bodies. Tympanic membrane is not erythematous and not bulging. No middle ear effusion. No decreased hearing is noted.  Left Ear: Hearing, tympanic membrane, external ear and ear canal normal. No drainage, swelling or tenderness. No foreign bodies. Tympanic membrane is not erythematous and not bulging.  No middle ear effusion. No decreased hearing is noted.  Nose: Nose normal. No rhinorrhea. Right sinus exhibits no maxillary sinus tenderness and no frontal sinus tenderness. Left sinus exhibits no maxillary sinus tenderness and no frontal sinus tenderness.  Mouth/Throat: Uvula is midline and mucous membranes are normal. Posterior oropharyngeal erythema present. No oropharyngeal exudate, posterior oropharyngeal edema or tonsillar abscesses.  Eyes: Conjunctivae are normal.  Cardiovascular: Normal rate, regular rhythm, normal heart sounds and normal pulses.   Pulmonary/Chest: Effort normal and breath sounds normal. She has no wheezes. She has no rhonchi. She has no rales.  Abdominal: There is no CVA tenderness.  Lymphadenopathy:       Head (right side): No submental, no submandibular, no tonsillar, no preauricular, no posterior auricular and no occipital adenopathy present.  Head (left side): No submental, no submandibular, no tonsillar, no preauricular, no posterior auricular and no occipital adenopathy present.    She has no cervical adenopathy.  Neurological: She is alert.  Skin: Skin is warm and dry.  Psychiatric: She has a normal mood and affect. Her speech is normal  and behavior is normal. Thought content normal.  Vitals reviewed.      Assessment & Plan:   1. Dysuria UA shows positive leukocytes protein and blood. Negative for nitrites. Urine culture from 10/ 2016 shows Klebsiella which was sensitive to ciprofloxacin. Will treat with ciprofloxacin again. Pending culture - Cipro - POCT urinalysis dipstick - CULTURE, URINE COMPREHENSIVE; Future  2. Cough Working diagnosis of viral URI and post nasal drip (?Allergic) as etiology of cough. No evidence of bacterial infection at this time. Patient and I agreed on conservative measures including Mucinex and Flonase over-the-counter.    I am having [name] maintain her terbinafine.   No orders of the defined types were placed in this encounter.     Start medications as prescribed and explained to patient on After Visit Summary ( AVS). Risks, benefits, and alternatives of the medications and treatment plan prescribed today were discussed, and patient expressed understanding.   Education regarding symptom management and diagnosis given to patient.   Follow-up:Plan follow-up as discussed or as needed if any worsening symptoms or change in condition. No Follow-up on file.   Continue to follow with [name], MD for routine health maintenance.   Alison Sullivan and I agreed with plan.   Mable Paris, FNP

## 2016-03-20 LAB — CULTURE, URINE COMPREHENSIVE

## 2016-09-02 ENCOUNTER — Ambulatory Visit (INDEPENDENT_AMBULATORY_CARE_PROVIDER_SITE_OTHER): Payer: Medicare Other

## 2016-09-02 DIAGNOSIS — Z23 Encounter for immunization: Secondary | ICD-10-CM | POA: Diagnosis not present

## 2016-09-26 ENCOUNTER — Ambulatory Visit (INDEPENDENT_AMBULATORY_CARE_PROVIDER_SITE_OTHER): Payer: Medicare Other

## 2016-09-26 DIAGNOSIS — Z23 Encounter for immunization: Secondary | ICD-10-CM | POA: Diagnosis not present

## 2017-01-05 ENCOUNTER — Ambulatory Visit (INDEPENDENT_AMBULATORY_CARE_PROVIDER_SITE_OTHER)
Admission: RE | Admit: 2017-01-05 | Discharge: 2017-01-05 | Disposition: A | Discharge status: Home / Self Care | Payer: Medicare Other | Source: Ambulatory Visit | Attending: Internal Medicine | Admitting: Internal Medicine

## 2017-01-05 ENCOUNTER — Encounter: Payer: Self-pay | Admitting: Internal Medicine

## 2017-01-05 ENCOUNTER — Other Ambulatory Visit (INDEPENDENT_AMBULATORY_CARE_PROVIDER_SITE_OTHER): Payer: Medicare Other

## 2017-01-05 ENCOUNTER — Ambulatory Visit (INDEPENDENT_AMBULATORY_CARE_PROVIDER_SITE_OTHER): Payer: Medicare Other | Admitting: Internal Medicine

## 2017-01-05 VITALS — BP 110/70 | HR 85 | Temp 97.6°F | Ht 66.0 in | Wt 152.0 lb

## 2017-01-05 DIAGNOSIS — Z1231 Encounter for screening mammogram for malignant neoplasm of breast: Secondary | ICD-10-CM | POA: Diagnosis not present

## 2017-01-05 DIAGNOSIS — E2839 Other primary ovarian failure: Secondary | ICD-10-CM

## 2017-01-05 DIAGNOSIS — R7989 Other specified abnormal findings of blood chemistry: Secondary | ICD-10-CM

## 2017-01-05 DIAGNOSIS — E1169 Type 2 diabetes mellitus with other specified complication: Secondary | ICD-10-CM

## 2017-01-05 DIAGNOSIS — E785 Hyperlipidemia, unspecified: Secondary | ICD-10-CM | POA: Diagnosis not present

## 2017-01-05 DIAGNOSIS — Z0000 Encounter for general adult medical examination without abnormal findings: Secondary | ICD-10-CM

## 2017-01-05 DIAGNOSIS — Z13220 Encounter for screening for lipoid disorders: Secondary | ICD-10-CM

## 2017-01-05 LAB — LDL CHOLESTEROL, DIRECT: LDL DIRECT: 175 mg/dL

## 2017-01-05 LAB — COMPREHENSIVE METABOLIC PANEL
ALBUMIN: 4 g/dL (ref 3.5–5.2)
ALK PHOS: 94 U/L (ref 39–117)
ALT: 15 U/L (ref 0–35)
AST: 20 U/L (ref 0–37)
BUN: 15 mg/dL (ref 6–23)
CHLORIDE: 107 meq/L (ref 96–112)
CO2: 30 mEq/L (ref 19–32)
Calcium: 9.2 mg/dL (ref 8.4–10.5)
Creatinine, Ser: 0.76 mg/dL (ref 0.40–1.20)
GFR: 80.99 mL/min (ref >60.00)
GLUCOSE: 104 mg/dL — AB (ref 70–99)
POTASSIUM: 3.7 meq/L (ref 3.5–5.1)
SODIUM: 141 meq/L (ref 135–145)
TOTAL PROTEIN: 7.2 g/dL (ref 6.0–8.3)
Total Bilirubin: 0.5 mg/dL (ref 0.2–1.2)

## 2017-01-05 LAB — CBC
HEMATOCRIT: 48.6 % — AB (ref 36.0–46.0)
Hemoglobin: 16.6 g/dL — ABNORMAL HIGH (ref 12.0–15.0)
MCHC: 34.1 g/dL (ref 30.0–36.0)
MCV: 90.2 fl (ref 78.0–100.0)
Platelets: 347 10*3/uL (ref 150.0–400.0)
RBC: 5.38 Mil/uL — AB (ref 3.87–5.11)
RDW: 13 % (ref 11.5–15.5)
WBC: 10 10*3/uL (ref 4.0–10.5)

## 2017-01-05 LAB — LIPID PANEL
CHOL/HDL RATIO: 8
CHOLESTEROL: 255 mg/dL — AB (ref 0–200)
HDL: 33.8 mg/dL — AB (ref >39.00)
NonHDL: 221.41
Triglycerides: 272 mg/dL — ABNORMAL HIGH (ref 0.0–149.0)
VLDL: 54.4 mg/dL — AB (ref 0.0–40.0)

## 2017-01-05 NOTE — Patient Instructions (Signed)
We have put in the the bone density and the mammogram.   Health Maintenance, Female Introduction Adopting a healthy lifestyle and getting preventive care can go a long way to promote health and wellness. Talk with your health care provider about what schedule of regular examinations is right for you. This is a good chance for you to check in with your provider about disease prevention and staying healthy. In between checkups, there are plenty of things you can do on your own. Experts have done a lot of research about which lifestyle changes and preventive measures are most likely to keep you healthy. Ask your health care provider for more information. Weight and diet Eat a healthy diet  Be sure to include plenty of vegetables, fruits, low-fat dairy products, and lean protein.  Do not eat a lot of foods high in solid fats, added sugars, or salt.  Get regular exercise. This is one of the most important things you can do for your health.  Most adults should exercise for at least 150 minutes each week. The exercise should increase your heart rate and make you sweat (moderate-intensity exercise).  Most adults should also do strengthening exercises at least twice a week. This is in addition to the moderate-intensity exercise. Maintain a healthy weight  Body mass index (BMI) is a measurement that can be used to identify possible weight problems. It estimates body fat based on height and weight. Your health care provider can help determine your BMI and help you achieve or maintain a healthy weight.  For females 32 years of age and older:  A BMI below 18.5 is considered underweight.  A BMI of 18.5 to 24.9 is normal.  A BMI of 25 to 29.9 is considered overweight.  A BMI of 30 and above is considered obese. Watch levels of cholesterol and blood lipids  You should start having your blood tested for lipids and cholesterol at 66 years of age, then have this test every 5 years.  You may need to  have your cholesterol levels checked more often if:  Your lipid or cholesterol levels are high.  You are older than 66 years of age.  You are at high risk for heart disease. Cancer screening Lung Cancer  Lung cancer screening is recommended for adults 57-67 years old who are at high risk for lung cancer because of a history of smoking.  A yearly low-dose CT scan of the lungs is recommended for people who:  Currently smoke.  Have quit within the past 15 years.  Have at least a 30-pack-year history of smoking. A pack year is smoking an average of one pack of cigarettes a day for 1 year.  Yearly screening should continue until it has been 15 years since you quit.  Yearly screening should stop if you develop a health problem that would prevent you from having lung cancer treatment. Breast Cancer  Practice breast self-awareness. This means understanding how your breasts normally appear and feel.  It also means doing regular breast self-exams. Let your health care provider know about any changes, no matter how small.  If you are in your 20s or 30s, you should have a clinical breast exam (CBE) by a health care provider every 1-3 years as part of a regular health exam.  If you are 64 or older, have a CBE every year. Also consider having a breast X-ray (mammogram) every year.  If you have a family history of breast cancer, talk to your health care provider  about genetic screening.  If you are at high risk for breast cancer, talk to your health care provider about having an MRI and a mammogram every year.  Breast cancer gene (BRCA) assessment is recommended for women who have family members with BRCA-related cancers. BRCA-related cancers include:  Breast.  Ovarian.  Tubal.  Peritoneal cancers.  Results of the assessment will determine the need for genetic counseling and BRCA1 and BRCA2 testing. Cervical Cancer  Your health care provider may recommend that you be screened  regularly for cancer of the pelvic organs (ovaries, uterus, and vagina). This screening involves a pelvic examination, including checking for microscopic changes to the surface of your cervix (Pap test). You may be encouraged to have this screening done every 3 years, beginning at age 78.  For women ages 50-65, health care providers may recommend pelvic exams and Pap testing every 3 years, or they may recommend the Pap and pelvic exam, combined with testing for human papilloma virus (HPV), every 5 years. Some types of HPV increase your risk of cervical cancer. Testing for HPV may also be done on women of any age with unclear Pap test results.  Other health care providers may not recommend any screening for nonpregnant women who are considered low risk for pelvic cancer and who do not have symptoms. Ask your health care provider if a screening pelvic exam is right for you.  If you have had past treatment for cervical cancer or a condition that could lead to cancer, you need Pap tests and screening for cancer for at least 20 years after your treatment. If Pap tests have been discontinued, your risk factors (such as having a new sexual partner) need to be reassessed to determine if screening should resume. Some women have medical problems that increase the chance of getting cervical cancer. In these cases, your health care provider may recommend more frequent screening and Pap tests. Colorectal Cancer  This type of cancer can be detected and often prevented.  Routine colorectal cancer screening usually begins at 66 years of age and continues through 66 years of age.  Your health care provider may recommend screening at an earlier age if you have risk factors for colon cancer.  Your health care provider may also recommend using home test kits to check for hidden blood in the stool.  A small camera at the end of a tube can be used to examine your colon directly (sigmoidoscopy or colonoscopy). This is  done to check for the earliest forms of colorectal cancer.  Routine screening usually begins at age 68.  Direct examination of the colon should be repeated every 5-10 years through 67 years of age. However, you may need to be screened more often if early forms of precancerous polyps or small growths are found. Skin Cancer  Check your skin from head to toe regularly.  Tell your health care provider about any new moles or changes in moles, especially if there is a change in a mole's shape or color.  Also tell your health care provider if you have a mole that is larger than the size of a pencil eraser.  Always use sunscreen. Apply sunscreen liberally and repeatedly throughout the day.  Protect yourself by wearing long sleeves, pants, a wide-brimmed hat, and sunglasses whenever you are outside. Heart disease, diabetes, and high blood pressure  High blood pressure causes heart disease and increases the risk of stroke. High blood pressure is more likely to develop in:  People who have  blood pressure in the high end of the normal range (130-139/85-89 mm Hg).  People who are overweight or obese.  People who are African American.  If you are 29-72 years of age, have your blood pressure checked every 3-5 years. If you are 88 years of age or older, have your blood pressure checked every year. You should have your blood pressure measured twice-once when you are at a hospital or clinic, and once when you are not at a hospital or clinic. Record the average of the two measurements. To check your blood pressure when you are not at a hospital or clinic, you can use:  An automated blood pressure machine at a pharmacy.  A home blood pressure monitor.  If you are between 55 years and 45 years old, ask your health care provider if you should take aspirin to prevent strokes.  Have regular diabetes screenings. This involves taking a blood sample to check your fasting blood sugar level.  If you are at a  normal weight and have a low risk for diabetes, have this test once every three years after 66 years of age.  If you are overweight and have a high risk for diabetes, consider being tested at a younger age or more often. Preventing infection Hepatitis B  If you have a higher risk for hepatitis B, you should be screened for this virus. You are considered at high risk for hepatitis B if:  You were born in a country where hepatitis B is common. Ask your health care provider which countries are considered high risk.  Your parents were born in a high-risk country, and you have not been immunized against hepatitis B (hepatitis B vaccine).  You have HIV or AIDS.  You use needles to inject street drugs.  You live with someone who has hepatitis B.  You have had sex with someone who has hepatitis B.  You get hemodialysis treatment.  You take certain medicines for conditions, including cancer, organ transplantation, and autoimmune conditions. Hepatitis C  Blood testing is recommended for:  Everyone born from [birth year] through 1965.  Anyone with known risk factors for hepatitis C. Sexually transmitted infections (STIs)  You should be screened for sexually transmitted infections (STIs) including gonorrhea and chlamydia if:  You are sexually active and are younger than 66 years of age.  You are older than 66 years of age and your health care provider tells you that you are at risk for this type of infection.  Your sexual activity has changed since you were last screened and you are at an increased risk for chlamydia or gonorrhea. Ask your health care provider if you are at risk.  If you do not have HIV, but are at risk, it may be recommended that you take a prescription medicine daily to prevent HIV infection. This is called pre-exposure prophylaxis (PrEP). You are considered at risk if:  You are sexually active and do not regularly use condoms or know the HIV status of your partner(s).  You  take drugs by injection.  You are sexually active with a partner who has HIV. Talk with your health care provider about whether you are at high risk of being infected with HIV. If you choose to begin PrEP, you should first be tested for HIV. You should then be tested every 3 months for as long as you are taking PrEP. Pregnancy  If you are premenopausal and you may become pregnant, ask your health care provider about preconception counseling.  If you may become pregnant, take 400 to 800 micrograms (mcg) of folic acid every day.  If you want to prevent pregnancy, talk to your health care provider about birth control (contraception). Osteoporosis and menopause  Osteoporosis is a disease in which the bones lose minerals and strength with aging. This can result in serious bone fractures. Your risk for osteoporosis can be identified using a bone density scan.  If you are 50 years of age or older, or if you are at risk for osteoporosis and fractures, ask your health care provider if you should be screened.  Ask your health care provider whether you should take a calcium or vitamin D supplement to lower your risk for osteoporosis.  Menopause may have certain physical symptoms and risks.  Hormone replacement therapy may reduce some of these symptoms and risks. Talk to your health care provider about whether hormone replacement therapy is right for you. Follow these instructions at home:  Schedule regular health, dental, and eye exams.  Stay current with your immunizations.  Do not use any tobacco products including cigarettes, chewing tobacco, or electronic cigarettes.  If you are pregnant, do not drink alcohol.  If you are breastfeeding, limit how much and how often you drink alcohol.  Limit alcohol intake to no more than 1 drink per day for nonpregnant women. One drink equals 12 ounces of beer, 5 ounces of wine, or 1 ounces of hard liquor.  Do not use street drugs.  Do not share  needles.  Ask your health care provider for help if you need support or information about quitting drugs.  Tell your health care provider if you often feel depressed.  Tell your health care provider if you have ever been abused or do not feel safe at home. This information is not intended to replace advice given to you by your health care provider. Make sure you discuss any questions you have with your health care provider. Document Released: 05/16/2011 Document Revised: 04/07/2016 Document Reviewed: 08/04/2015  2017 [employer]vier

## 2017-01-05 NOTE — Progress Notes (Signed)
Pre visit review using our clinic review tool, if applicable. No additional management support is needed unless otherwise documented below in the visit note. 

## 2017-01-05 NOTE — Progress Notes (Signed)
   Subjective:    Patient ID: Alison Sullivan, female    DOB: 10/21/1951, 66 y.o.   MRN: [id number]  HPI Here for new medicare physical, no new complaints. Please see A/P for status and treatment of chronic medical problems.   Diet: heart healthy Physical activity: sedentary Depression/mood screen: negative Hearing: intact to whispered voice Visual acuity: grossly normal with lens, performs annual eye exam  ADLs: capable Fall risk: none Home safety: good Cognitive evaluation: intact to orientation, naming, recall and repetition EOL planning: adv directives discussed  I have personally reviewed and have noted 1. The patient's medical and social history - reviewed today no changes 2. Their use of alcohol, tobacco or illicit drugs 3. Their current medications and supplements 4. The patient's functional ability including ADL's, fall risks, home safety risks and hearing or visual impairment. 5. Diet and physical activities 6. Evidence for depression or mood disorders 7. Care team reviewed and updated (available in snapshot)  Review of Systems  Constitutional: Negative.   HENT: Negative.   Eyes: Negative.   Respiratory: Negative for cough, chest tightness and shortness of breath.   Cardiovascular: Negative for chest pain, palpitations and leg swelling.  Gastrointestinal: Negative for abdominal distention, abdominal pain, constipation, diarrhea, nausea and vomiting.  Musculoskeletal: Negative.   Skin: Negative.   Neurological: Negative.   Psychiatric/Behavioral: Negative.       Objective:   Physical Exam  Constitutional: She is oriented to person, place, and time. She appears well-developed and well-nourished.  HENT:  Head: Normocephalic and atraumatic.  Eyes: EOM are normal.  Neck: Normal range of motion.  Cardiovascular: Normal rate and regular rhythm.   Pulmonary/Chest: Effort normal and breath sounds normal. No respiratory distress. She has no wheezes. She has no rales.    Abdominal: Soft. Bowel sounds are normal. She exhibits no distension. There is no tenderness. There is no rebound.  Musculoskeletal: She exhibits no edema.  Neurological: She is alert and oriented to person, place, and time. Coordination normal.  Skin: Skin is warm and dry.  Psychiatric: She has a normal mood and affect.   Vitals:   01/05/17 1259  BP: 110/70  Pulse: 85  Temp: 97.6 F (36.4 C)  TempSrc: Oral  SpO2: 96%  Weight: 152 lb (68.9 kg)  Height: '5\' 6"'$  (1.676 m)      Assessment & Plan:

## 2017-01-05 NOTE — Assessment & Plan Note (Signed)
Ordered mammogram and bone density. Cologuard ordered at today's visit. Needs labs today for screening. Counseled about sun safety and mole surveillance. Pneumonia series up to date. Flu shot done. Declines the need for HIV screening. Hep c screening done. Counseled about advanced directives. Given 10 year screening recommendations.

## 2017-01-11 ENCOUNTER — Other Ambulatory Visit: Payer: Self-pay | Admitting: Internal Medicine

## 2017-01-11 ENCOUNTER — Telehealth: Payer: Self-pay

## 2017-01-11 MED ORDER — SIMVASTATIN 20 MG PO TABS: 20.0000 mg | ORAL_TABLET | Freq: Every day | ORAL | 3 refills | Status: DC

## 2017-01-11 NOTE — Telephone Encounter (Signed)
Medication sent in to [facility]

## 2017-01-12 ENCOUNTER — Other Ambulatory Visit: Payer: Self-pay | Admitting: Internal Medicine

## 2017-01-12 DIAGNOSIS — Z1231 Encounter for screening mammogram for malignant neoplasm of breast: Secondary | ICD-10-CM

## 2017-01-12 MED ORDER — SIMVASTATIN 20 MG PO TABS: 20.0000 mg | ORAL_TABLET | Freq: Every day | ORAL | 3 refills | Status: DC

## 2017-01-12 MED ORDER — SIMVASTATIN 20 MG PO TABS
20.0000 mg | ORAL_TABLET | Freq: Every day | ORAL | 3 refills | Status: DC
Start: 2017-01-12 — End: 2018-01-25

## 2017-01-12 NOTE — Telephone Encounter (Signed)
Sent again

## 2017-01-12 NOTE — Telephone Encounter (Signed)
Patient states she called over to [facility] today and they do not have medication.  Can you please resend and follow back up with patient.

## 2017-01-12 NOTE — Telephone Encounter (Signed)
Alison Sullivan left msg on triage stating pt states we have been trying to send her cholesterol medication, and pharmacy not receiving. Per chart MD assistant refilled the pravastatin rx was printed did not go through electronically. Resending rx electronically to [facility]...[name]

## 2017-01-12 NOTE — Addendum Note (Signed)
Addended by: Loren Racer B on: 01/12/2017 01:24 PM   Modules accepted: Orders

## 2017-01-12 NOTE — Addendum Note (Signed)
Addended by: Earnstine Regal on: 01/12/2017 03:40 PM   Modules accepted: Orders

## 2017-01-31 ENCOUNTER — Ambulatory Visit
Admission: RE | Admit: 2017-01-31 | Discharge: 2017-01-31 | Disposition: A | Discharge status: Home / Self Care | Payer: Medicare Other | Source: Ambulatory Visit | Attending: Internal Medicine | Admitting: Internal Medicine

## 2017-01-31 DIAGNOSIS — Z1231 Encounter for screening mammogram for malignant neoplasm of breast: Secondary | ICD-10-CM | POA: Diagnosis not present

## 2017-06-02 ENCOUNTER — Ambulatory Visit (INDEPENDENT_AMBULATORY_CARE_PROVIDER_SITE_OTHER): Payer: Medicare Other | Admitting: Family Medicine

## 2017-06-02 ENCOUNTER — Encounter: Payer: Self-pay | Admitting: Family Medicine

## 2017-06-02 ENCOUNTER — Other Ambulatory Visit: Payer: Medicare Other

## 2017-06-02 VITALS — BP 128/86 | HR 86 | Temp 97.8°F | Resp 12 | Ht 66.0 in | Wt 153.0 lb

## 2017-06-02 DIAGNOSIS — M545 Low back pain: Secondary | ICD-10-CM | POA: Diagnosis not present

## 2017-06-02 DIAGNOSIS — N3091 Cystitis, unspecified with hematuria: Secondary | ICD-10-CM | POA: Diagnosis not present

## 2017-06-02 DIAGNOSIS — R300 Dysuria: Secondary | ICD-10-CM

## 2017-06-02 LAB — POC URINALSYSI DIPSTICK (AUTOMATED)
BILIRUBIN UA: NEGATIVE
GLUCOSE UA: NEGATIVE
Ketones, UA: NEGATIVE
NITRITE UA: NEGATIVE
Spec Grav, UA: 1.025 (ref 1.010–1.025)
Urobilinogen, UA: 0.2 E.U./dL
pH, UA: 6 (ref 5.0–8.0)

## 2017-06-02 MED ORDER — CIPROFLOXACIN HCL 250 MG PO TABS: 250.0000 mg | ORAL_TABLET | Freq: Two times a day (BID) | ORAL | 0 refills | Status: DC

## 2017-06-02 NOTE — Patient Instructions (Signed)
I have sent in a 10 day prescription of cipro- if you are better in 7 days, can stop Ciprofloxacin tablets What is this medicine? CIPROFLOXACIN (sip roe FLOX a sin) is a quinolone antibiotic. It is used to treat certain kinds of bacterial infections. It will not work for colds, flu, or other viral infections. This medicine may be used for other purposes; ask your health care provider or pharmacist if you have questions. COMMON BRAND NAME(S): Cipro What should I tell my health care provider before I take this medicine? They need to know if you have any of these conditions: -bone problems -history of low levels of potassium in the blood -joint problems -irregular heartbeat -kidney disease -myasthenia gravis -seizures -tendon problems -tingling of the fingers or toes, or other nerve disorder -an unusual or allergic reaction to ciprofloxacin, other antibiotics or medicines, foods, dyes, or preservatives -pregnant or trying to get pregnant -breast-feeding How should I use this medicine? Take this medicine by mouth with a glass of water. Follow the directions on the prescription label. Take your medicine at regular intervals. Do not take your medicine more often than directed. Take all of your medicine as directed even if you think your are better. Do not skip doses or stop your medicine early. You can take this medicine with food or on an empty stomach. It can be taken with a meal that contains dairy or calcium, but do not take it alone with a dairy product, like milk or yogurt or calcium-fortified juice. A special MedGuide will be given to you by the pharmacist with each prescription and refill. Be sure to read this information carefully each time. Talk to your pediatrician regarding the use of this medicine in children. Special care may be needed. Overdosage: If you think you have taken too much of this medicine contact a poison control center or emergency room at once. NOTE: This medicine is  only for you. Do not share this medicine with others. What if I miss a dose? If you miss a dose, take it as soon as you can. If it is almost time for your next dose, take only that dose. Do not take double or extra doses. What may interact with this medicine? Do not take this medicine with any of the following medications: -cisapride -dofetilide -dronedarone -flibanserin -lomitapide -pimozide -thioridazine -tizanidine -ziprasidone This medicine may also interact with the following medications: -antacids -birth control pills -caffeine -certain medicines for diabetes, like glipizide or glyburide -certain medicines that treat or prevent blood clots like warfarin -clozapine -cyclosporine -didanosine (ddI) buffered tablets or powder -duloxetine -lanthanum carbonate -lidocaine -methotrexate -multivitamins -NSAIDS, medicines for pain and inflammation, like ibuprofen or naproxen -olanzapine -omeprazole -other medicines that prolong the QT interval (cause an abnormal heart rhythm) -phenytoin -probenecid -ropinirole -sevelamer -sildenafil -sucralfate -theophylline -zolpidem This list may not describe all possible interactions. Give your health care provider a list of all the medicines, herbs, non-prescription drugs, or dietary supplements you use. Also tell them if you smoke, drink alcohol, or use illegal drugs. Some items may interact with your medicine. What should I watch for while using this medicine? Tell your doctor or health care professional if your symptoms do not improve. Do not treat diarrhea with over the counter products. Contact your doctor if you have diarrhea that lasts more than 2 days or if it is severe and watery. You may get drowsy or dizzy. Do not drive, use machinery, or do anything that needs mental alertness until you know how this  medicine affects you. Do not stand or sit up quickly, especially if you are an older patient. This reduces the risk of dizzy or  fainting spells. This medicine can make you more sensitive to the sun. Keep out of the sun. If you cannot avoid being in the sun, wear protective clothing and use sunscreen. Do not use sun lamps or tanning beds/booths. Avoid antacids, aluminum, calcium, iron, magnesium, and zinc products for 6 hours before and 2 hours after taking a dose of this medicine. What side effects may I notice from receiving this medicine? Side effects that you should report to your doctor or health care professional as soon as possible: -allergic reactions like skin rash or hives, swelling of the face, lips, or tongue -anxious -confusion -depressed mood -diarrhea -fast, irregular heartbeat -hallucination, loss of contact with reality -joint, muscle, or tendon pain or swelling -pain, tingling, numbness in the hands or feet -suicidal thoughts or other mood changes -sunburn -unusually weak or tired Side effects that usually do not require medical attention (report to your doctor or health care professional if they continue or are bothersome): -dry mouth -headache -nausea -trouble sleeping This list may not describe all possible side effects. Call your doctor for medical advice about side effects. You may report side effects to FDA at 1-800-FDA-1088. Where should I keep my medicine? Keep out of the reach of children. Store at room temperature below 30 degrees C (86 degrees F). Keep container tightly closed. Throw away any unused medicine after the expiration date. NOTE: This sheet is a summary. It may not cover all possible information. If you have questions about this medicine, talk to your doctor, pharmacist, or health care provider.  2018 Elsevier/Gold Standard (2016-06-10 14:42:02)   Urinary Tract Infection, Adult A urinary tract infection (UTI) is an infection of any part of the urinary tract, which includes the kidneys, ureters, bladder, and urethra. These organs make, store, and get rid of urine in the  body. UTI can be a bladder infection (cystitis) or kidney infection (pyelonephritis). What are the causes? This infection may be caused by fungi, viruses, or bacteria. Bacteria are the most common cause of UTIs. This condition can also be caused by repeated incomplete emptying of the bladder during urination. What increases the risk? This condition is more likely to develop if:  You ignore your need to urinate or hold urine for long periods of time.  You do not empty your bladder completely during urination.  You wipe back to front after urinating or having a bowel movement, if you are female.  You are uncircumcised, if you are female.  You are constipated.  You have a urinary catheter that stays in place (indwelling).  You have a weak defense (immune) system.  You have a medical condition that affects your bowels, kidneys, or bladder.  You have diabetes.  You take antibiotic medicines frequently or for long periods of time, and the antibiotics no longer work well against certain types of infections (antibiotic resistance).  You take medicines that irritate your urinary tract.  You are exposed to chemicals that irritate your urinary tract.  You are female.  What are the signs or symptoms? Symptoms of this condition include:  Fever.  Frequent urination or passing small amounts of urine frequently.  Needing to urinate urgently.  Pain or burning with urination.  Urine that smells bad or unusual.  Cloudy urine.  Pain in the lower abdomen or back.  Trouble urinating.  Blood in the urine.  Vomiting or being less hungry than normal.  Diarrhea or abdominal pain.  Vaginal discharge, if you are female.  How is this diagnosed? This condition is diagnosed with a medical history and physical exam. You will also need to provide a urine sample to test your urine. Other tests may be done, including:  Blood tests.  Sexually transmitted disease (STD) testing.  If you  have had more than one UTI, a cystoscopy or imaging studies may be done to determine the cause of the infections. How is this treated? Treatment for this condition often includes a combination of two or more of the following:  Antibiotic medicine.  Other medicines to treat less common causes of UTI.  Over-the-counter medicines to treat pain.  Drinking enough water to stay hydrated.  Follow these instructions at home:  Take over-the-counter and prescription medicines only as told by your health care provider.  If you were prescribed an antibiotic, take it as told by your health care provider. Do not stop taking the antibiotic even if you start to feel better.  Avoid alcohol, caffeine, tea, and carbonated beverages. They can irritate your bladder.  Drink enough fluid to keep your urine clear or pale yellow.  Keep all follow-up visits as told by your health care provider. This is important.  Make sure to: ? Empty your bladder often and completely. Do not hold urine for long periods of time. ? Empty your bladder before and after sex. ? Wipe from front to back after a bowel movement if you are female. Use each tissue one time when you wipe. Contact a health care provider if:  You have back pain.  You have a fever.  You feel nauseous or vomit.  Your symptoms do not get better after 3 days.  Your symptoms go away and then return. Get help right away if:  You have severe back pain or lower abdominal pain.  You are vomiting and cannot keep down any medicines or water. This information is not intended to replace advice given to you by your health care provider. Make sure you discuss any questions you have with your health care provider. Document Released: 08/10/2005 Document Revised: 04/13/2016 Document Reviewed: 09/21/2015 Elsevier Interactive Patient Education  2017 [employer].

## 2017-06-02 NOTE — Progress Notes (Signed)
Subjective:    Patient ID: Alison Sullivan, female    DOB: 09-17-1951, 66 y.o.   MRN: [id number]  HPI This is a 66 yo female who presents today with low left side back pain x 2 days. No fever or chills, never has burning with UTI. Had last UTI 4/17. No nausea or vomiting. Has been worked up by urology in the past. Has always used cipro. Has history of frequent UTI and kidney stones- this does not feel like her kidney stones.   Long time smoker.   Past Medical History:  Diagnosis Date  . Chronic kidney disease   . UTI (urinary tract infection)    Past Surgical History:  Procedure Laterality Date  . KIDNEY STONE SURGERY  2012  . LITHOTRIPSY  2007   Family History  Problem Relation Age of Onset  . Cancer Father        pancreatic   Social History  Substance Use Topics  . Smoking status: Current Every Day Smoker    Packs/day: 0.50    Types: Cigarettes  . Smokeless tobacco: Never Used  . Alcohol use Yes      Review of Systems Per HPI    Objective:   Physical Exam  Constitutional: She is oriented to person, place, and time. She appears well-developed and well-nourished. No distress.  HENT:  Head: Normocephalic and atraumatic.  Eyes: Conjunctivae are normal.  Cardiovascular: Normal rate, regular rhythm and normal heart sounds.   Pulmonary/Chest: Effort normal and breath sounds normal.  Abdominal: Soft. She exhibits no distension. There is no tenderness. There is no rebound, no guarding and no CVA tenderness.  Neurological: She is alert and oriented to person, place, and time.  Skin: No rash noted. She is not diaphoretic.  Psychiatric: She has a normal mood and affect. Her behavior is normal. Judgment and thought content normal.  Vitals reviewed.     BP 128/86 (BP Location: Left Arm, Patient Position: Sitting, Cuff Size: Normal)   Pulse 86   Temp 97.8 F (36.6 C) (Oral)   Resp 12   Ht 5\' 6"  (1.676 m)   Wt 153 lb (69.4 kg)   SpO2 98%   BMI 24.69 kg/m  Wt  Readings from Last 3 Encounters:  06/02/17 153 lb (69.4 kg)  01/05/17 152 lb (68.9 kg)  03/17/16 156 lb 8 oz (71 kg)   Results for orders placed or performed in visit on 06/02/17  POCT Urinalysis Dipstick (Automated)  Result Value Ref Range   Color, UA dark yellow    Clarity, UA cloudy    Glucose, UA neg    Bilirubin, UA neg    Ketones, UA neg    Spec Grav, UA 1.025 1.010 - 1.025   Blood, UA 1+    pH, UA 6.0 5.0 - 8.0   Protein, UA 1+    Urobilinogen, UA 0.2 0.2 or 1.0 E.U./dL   Nitrite, UA neg    Leukocytes, UA Large (3+) (A) Negative       Assessment & Plan:  1. Acute left-sided low back pain, with sciatica presence unspecified - POCT Urinalysis Dipstick (Automated)  2. Dysuria - Urine Culture; Future  3. Cystitis with hematuria - Provided written and verbal information regarding diagnosis and treatment. - provided cipro prescription, discussed potential side effects, she can stop if symptoms resolved in 5-7 days - Urine Culture; Future - ciprofloxacin (CIPRO) 250 MG tablet; Take 1 tablet (250 mg total) by mouth 2 (two) times daily.  Dispense: 20  tablet; Refill: 0 - Urinalysis, Routine w reflex microscopic; Future - follow up urinalysis in two weeks to see if hematuria resolved (she is at increased risk bladder cancer with smoking history)  Clarene Reamer, FNP-BC  [facility]  06/02/2017 2:08 PM

## 2017-06-04 LAB — URINE CULTURE

## 2017-07-05 ENCOUNTER — Encounter: Payer: Self-pay | Admitting: Family Medicine

## 2017-07-05 ENCOUNTER — Other Ambulatory Visit (INDEPENDENT_AMBULATORY_CARE_PROVIDER_SITE_OTHER): Payer: Medicare Other

## 2017-07-05 ENCOUNTER — Telehealth: Payer: Self-pay | Admitting: Family Medicine

## 2017-07-05 ENCOUNTER — Ambulatory Visit (INDEPENDENT_AMBULATORY_CARE_PROVIDER_SITE_OTHER): Payer: Medicare Other | Admitting: Family Medicine

## 2017-07-05 VITALS — BP 110/80 | HR 88 | Temp 97.5°F | Ht 66.0 in | Wt 153.0 lb

## 2017-07-05 DIAGNOSIS — N898 Other specified noninflammatory disorders of vagina: Secondary | ICD-10-CM | POA: Insufficient documentation

## 2017-07-05 DIAGNOSIS — L298 Other pruritus: Secondary | ICD-10-CM

## 2017-07-05 DIAGNOSIS — R300 Dysuria: Secondary | ICD-10-CM

## 2017-07-05 LAB — URINALYSIS, ROUTINE W REFLEX MICROSCOPIC
Bilirubin Urine: NEGATIVE
Ketones, ur: NEGATIVE
Nitrite: NEGATIVE
PH: 6 (ref 5.0–8.0)
SPECIFIC GRAVITY, URINE: 1.015 (ref 1.000–1.030)
TOTAL PROTEIN, URINE-UPE24: 100 — AB
Urine Glucose: NEGATIVE
Urobilinogen, UA: 0.2 (ref 0.0–1.0)

## 2017-07-05 MED ORDER — CIPROFLOXACIN HCL 500 MG PO TABS: 500.0000 mg | ORAL_TABLET | Freq: Two times a day (BID) | ORAL | 0 refills | Status: DC

## 2017-07-05 MED ORDER — FLUCONAZOLE 150 MG PO TABS: 150.0000 mg | ORAL_TABLET | Freq: Once | ORAL | 0 refills | Status: AC

## 2017-07-05 NOTE — Telephone Encounter (Signed)
Discussed UA results with patient.   Rosemarie Ax, MD [facility] 07/05/2017, 2:22 PM

## 2017-07-05 NOTE — Patient Instructions (Addendum)
Thank you for coming in,   We will call with results from today. I sent in Diflucan. If the urine is suspect for an infection then I can send an antibiotic. Have also obtaining a urine culture.   Please feel free to call with any questions or concerns at any time, at [phone]. --Dr. Raeford Razor

## 2017-07-05 NOTE — Progress Notes (Signed)
Alison Sullivan - 66 y.o. female MRN [id number]  Date of birth: [birth year]  SUBJECTIVE:  Including CC & ROS.  Chief Complaint  Patient presents with  . Urinary Tract Infection    Patient states slight discomfort when she urinates and has a pain in lower abdomen    Alison Sullivan is a 66 year old female is presenting with some dysuria. She has a significant history for previous cystitis, pyelonephritis, and nephrolithiasis. She is followed by urology. She reports frequent urinary problems. She reports her symptoms started recently. She denies any fever or chills. She denies any flank pain. She has some abdominal discomfort but nothing severe. She denies any chance of having an STD. The symptoms are mild in nature currently. She has been prescribed ciprofloxacin previously for previous infections. She will also reports taking antibiotics in July. She has had some vaginal itching has been associated with antibiotic use. She is not currently taking anything for this. She reports a prolonged course of ciprofloxacin and had some yeast in her mouth.   She has a history of a urine culture from 10/16 that shows growth of Klebsiella. Urine culture from 03/17/16 shows a growth of Escherichia coli.  Review of Systems  Constitutional: Negative for fever.  Endocrine: Negative for polyuria.  Genitourinary: Positive for dysuria and frequency. Negative for hematuria.  Skin: Negative for rash.  Neurological: Negative for weakness and numbness.    HISTORY: Past Medical, Surgical, Social, and Family History Reviewed & Updated per EMR.   Pertinent Historical Findings include:  Past Medical History:  Diagnosis Date  . Chronic kidney disease   . UTI (urinary tract infection)     Past Surgical History:  Procedure Laterality Date  . KIDNEY STONE SURGERY  2012  . LITHOTRIPSY  2007    No Known Allergies  Family History  Problem Relation Age of Onset  . Cancer Father        pancreatic     Social History     Social History  . Marital status: Single    Spouse name: N/A  . Number of children: N/A  . Years of education: N/A   Occupational History  . Not on file.   Social History Main Topics  . Smoking status: Current Every Day Smoker    Packs/day: 0.50    Types: Cigarettes  . Smokeless tobacco: Never Used  . Alcohol use Yes  . Drug use: No  . Sexual activity: Not on file   Other Topics Concern  . Not on file   Social History Narrative  . No narrative on file     PHYSICAL EXAM:  VS: BP 110/80 (BP Location: Left Arm, Patient Position: Sitting, Cuff Size: Normal)   Pulse 88   Temp (!) 97.5 F (36.4 C) (Oral)   Ht 5\' 6"  (1.676 m)   Wt 153 lb (69.4 kg)   SpO2 99%   BMI 24.69 kg/m  Physical Exam Gen: NAD, alert, cooperative with exam, well-appearing ENT: normal lips, normal nasal mucosa,  Eye: normal EOM, normal conjunctiva and lids CV:  no edema, +2 pedal pulses, regular rate and rhythm, S1-S2   Resp: no accessory muscle use, non-labored, clear to auscultation bilaterally GI: no masses or tenderness, no hernia, soft, nontender, nondistended, no suprapubic tenderness  Skin: no rashes, no areas of induration  Neuro: normal tone, normal sensation to touch Psych:  normal insight, alert and oriented MSK: Normal gait, normal strength      ASSESSMENT & PLAN:   Dysuria Significant history  of cystitis and pyelonephritis. Was to catch it before there is a chance of having worsening infection. - Urinalysis and urine culture. - If urinalysis suspect of an infection then we'll send in Cipro  Vaginal itching Reports symptoms suggestive of yeast infection. - Diflucan sent that she can take if antibiotics are needed area

## 2017-07-05 NOTE — Assessment & Plan Note (Signed)
Reports symptoms suggestive of yeast infection. - Diflucan sent that she can take if antibiotics are needed area

## 2017-07-05 NOTE — Assessment & Plan Note (Signed)
Significant history of cystitis and pyelonephritis. Was to catch it before there is a chance of having worsening infection. - Urinalysis and urine culture. - If urinalysis suspect of an infection then we'll send in Cipro

## 2017-07-06 LAB — URINE CULTURE

## 2017-09-11 ENCOUNTER — Ambulatory Visit (INDEPENDENT_AMBULATORY_CARE_PROVIDER_SITE_OTHER): Payer: Medicare Other

## 2017-09-11 DIAGNOSIS — Z23 Encounter for immunization: Secondary | ICD-10-CM | POA: Diagnosis not present

## 2017-12-09 ENCOUNTER — Ambulatory Visit (INDEPENDENT_AMBULATORY_CARE_PROVIDER_SITE_OTHER): Payer: Medicare Other | Admitting: Adult Health

## 2017-12-09 ENCOUNTER — Other Ambulatory Visit (HOSPITAL_COMMUNITY)
Admit: 2017-12-09 | Discharge: 2017-12-09 | Disposition: A | Discharge status: Home / Self Care | Payer: Medicare Other | Source: Other Acute Inpatient Hospital | Attending: Adult Health | Admitting: Adult Health

## 2017-12-09 ENCOUNTER — Encounter: Payer: Self-pay | Admitting: Adult Health

## 2017-12-09 VITALS — BP 116/84 | HR 76 | Temp 97.8°F | Resp 16 | Wt 158.0 lb

## 2017-12-09 DIAGNOSIS — N3001 Acute cystitis with hematuria: Secondary | ICD-10-CM | POA: Diagnosis not present

## 2017-12-09 LAB — POCT URINALYSIS DIPSTICK
Bilirubin, UA: NEGATIVE
Glucose, UA: NEGATIVE
KETONES UA: NEGATIVE
Nitrite, UA: NEGATIVE
PH UA: 6 (ref 5.0–8.0)
Spec Grav, UA: 1.025 (ref 1.010–1.025)
UROBILINOGEN UA: 0.2 U/dL

## 2017-12-09 MED ORDER — CIPROFLOXACIN HCL 500 MG PO TABS: 500.0000 mg | ORAL_TABLET | Freq: Two times a day (BID) | ORAL | 0 refills | Status: DC

## 2017-12-09 NOTE — Progress Notes (Signed)
Subjective:    Patient ID: Alison Sullivan, female    DOB: [birth year], 67 y.o.   MRN: [id number]  Urinary Tract Infection   This is a recurrent problem. The current episode started yesterday. The quality of the pain is described as shooting and burning. The pain is moderate. There has been no fever. Associated symptoms include flank pain (left ). Pertinent negatives include no chills, frequency, hematuria, nausea, urgency or vomiting. She has tried nothing for the symptoms. Her past medical history is significant for kidney stones and recurrent UTIs.     Review of Systems  Constitutional: Negative for chills.  Gastrointestinal: Negative for nausea and vomiting.  Genitourinary: Positive for decreased urine volume, difficulty urinating and flank pain (left ). Negative for frequency, hematuria and urgency.   Past Medical History:  Diagnosis Date  . Chronic kidney disease   . UTI (urinary tract infection)     Social History   Socioeconomic History  . Marital status: Single    Spouse name: Not on file  . Number of children: Not on file  . Years of education: Not on file  . Highest education level: Not on file  Social Needs  . Financial resource strain: Not on file  . Food insecurity - worry: Not on file  . Food insecurity - inability: Not on file  . Transportation needs - medical: Not on file  . Transportation needs - non-medical: Not on file  Occupational History  . Not on file  Tobacco Use  . Smoking status: Current Every Day Smoker    Packs/day: 0.50    Types: Cigarettes  . Smokeless tobacco: Never Used  Substance and Sexual Activity  . Alcohol use: Yes  . Drug use: No  . Sexual activity: Not on file  Other Topics Concern  . Not on file  Social History Narrative  . Not on file    Past Surgical History:  Procedure Laterality Date  . KIDNEY STONE SURGERY  2012  . LITHOTRIPSY  2007    Family History  Problem Relation Age of Onset  . Cancer Father    pancreatic    No Known Allergies  Current Outpatient Medications on File Prior to Visit  Medication Sig Dispense Refill  . simvastatin (ZOCOR) 20 MG tablet Take 1 tablet (20 mg total) by mouth at bedtime. 90 tablet 3   No current facility-administered medications on file prior to visit.     BP 116/84   Pulse 76   Temp 97.8 F (36.6 C) (Oral)   Resp 16   Wt 158 lb (71.7 kg)   SpO2 97%   BMI 25.50 kg/m       Objective:   Physical Exam  Constitutional: She is oriented to person, place, and time. She appears well-developed and well-nourished. No distress.  Cardiovascular: Normal rate, regular rhythm, normal heart sounds and intact distal pulses. Exam reveals no gallop and no friction rub.  No murmur heard. Pulmonary/Chest: Effort normal and breath sounds normal. No respiratory distress. She has no wheezes. She has no rales. She exhibits no tenderness.  Abdominal: Normal appearance and bowel sounds are normal. There is no hepatosplenomegaly, splenomegaly or hepatomegaly. There is no tenderness. There is CVA tenderness.  Neurological: She is alert and oriented to person, place, and time.  Skin: Skin is warm and dry. No rash noted. She is not diaphoretic. No erythema. No pallor.  Psychiatric: She has a normal mood and affect. Her behavior is normal. Judgment and thought content normal.  Nursing note and vitals reviewed.      Assessment & Plan:  1. Acute cystitis with hematuria - POCT urinalysis dipstick + leuks, blood, protein - Urine Culture - ciprofloxacin (CIPRO) 500 MG tablet; Take 1 tablet (500 mg total) by mouth 2 (two) times daily.  Dispense: 6 tablet; Refill: 0 - stay hydrated  - Follow up with PCP if needed  Dorothyann Peng, NP

## 2017-12-09 NOTE — Addendum Note (Signed)
Addended by: Terence Lux B on: 12/09/2017 12:55 PM   Modules accepted: Orders

## 2017-12-09 NOTE — Addendum Note (Signed)
Addended by: Terence Lux B on: 12/09/2017 12:56 PM   Modules accepted: Orders

## 2017-12-10 LAB — URINE CULTURE: Culture: >=100000 — AB

## 2017-12-18 ENCOUNTER — Telehealth: Payer: Self-pay | Admitting: Internal Medicine

## 2017-12-18 NOTE — Telephone Encounter (Unsigned)
Copied from [place] [id number]. Topic: Inquiry >> Dec 18, 2017  3:56 PM Neva Seat wrote: [facility] -  att:  Dr. Irving Burton - Fax  [phone]  They need visit notes, lab and culture from 12-09-17 visit before pt's appt tomorrow morning at 10 am

## 2017-12-18 NOTE — Telephone Encounter (Signed)
Information faxed to [facility]

## 2017-12-19 DIAGNOSIS — R8271 Bacteriuria: Secondary | ICD-10-CM | POA: Diagnosis not present

## 2017-12-19 DIAGNOSIS — R3121 Asymptomatic microscopic hematuria: Secondary | ICD-10-CM | POA: Diagnosis not present

## 2017-12-19 DIAGNOSIS — N201 Calculus of ureter: Secondary | ICD-10-CM | POA: Diagnosis not present

## 2017-12-19 DIAGNOSIS — N132 Hydronephrosis with renal and ureteral calculous obstruction: Secondary | ICD-10-CM | POA: Diagnosis not present

## 2017-12-20 ENCOUNTER — Other Ambulatory Visit: Payer: Self-pay | Admitting: Urology

## 2017-12-22 ENCOUNTER — Encounter (HOSPITAL_BASED_OUTPATIENT_CLINIC_OR_DEPARTMENT_OTHER): Payer: Self-pay

## 2017-12-25 ENCOUNTER — Other Ambulatory Visit: Payer: Self-pay

## 2017-12-25 ENCOUNTER — Encounter (HOSPITAL_BASED_OUTPATIENT_CLINIC_OR_DEPARTMENT_OTHER): Payer: Self-pay

## 2017-12-25 NOTE — Progress Notes (Signed)
Spoke with:  Alison Sullivan  NPO:  After Midnight, no gum, candy, or mints   Arrival time: 0530AM Labs:  Hemoglobin AM medications: None Pre op orders: Yes Ride home: Shea Stakes (son) [phone]

## 2017-12-27 ENCOUNTER — Other Ambulatory Visit: Payer: Self-pay | Admitting: Urology

## 2017-12-27 DIAGNOSIS — N201 Calculus of ureter: Secondary | ICD-10-CM | POA: Diagnosis not present

## 2017-12-27 NOTE — Anesthesia Preprocedure Evaluation (Addendum)
Anesthesia Evaluation  Patient identified by MRN, date of birth, ID band Patient awake    Reviewed: Allergy & Precautions, NPO status , Patient's Chart, lab work & pertinent test results  Airway Mallampati: II  TM Distance: >3 FB Neck ROM: Full    Dental no notable dental hx. (+) Implants   Pulmonary neg pulmonary ROS, Current Smoker,    Pulmonary exam normal breath sounds clear to auscultation       Cardiovascular negative cardio ROS Normal cardiovascular exam Rhythm:Regular Rate:Normal     Neuro/Psych negative neurological ROS  negative psych ROS   GI/Hepatic negative GI ROS, Neg liver ROS,   Endo/Other  negative endocrine ROS  Renal/GU negative Renal ROS  negative genitourinary   Musculoskeletal negative musculoskeletal ROS (+)   Abdominal   Peds  Hematology negative hematology ROS (+)   Anesthesia Other Findings   Reproductive/Obstetrics                            Anesthesia Physical Anesthesia Plan  ASA: II  Anesthesia Plan: General   Post-op Pain Management:    Induction:   PONV Risk Score and Plan: Treatment may vary due to age or medical condition, Ondansetron and Dexamethasone  Airway Management Planned: LMA  Additional Equipment:   Intra-op Plan:   Post-operative Plan:   Informed Consent: I have reviewed the patients History and Physical, chart, labs and discussed the procedure including the risks, benefits and alternatives for the proposed anesthesia with the patient or authorized representative who has indicated his/her understanding and acceptance.     Plan Discussed with: CRNA and Anesthesiologist  Anesthesia Plan Comments:         Anesthesia Quick Evaluation

## 2017-12-27 NOTE — H&P (Signed)
Office Visit Report     12/19/2017   --------------------------------------------------------------------------------   Jobe Marker. Silvera  MRN: [id number]  PRIMARY CARE:  Pricilla Holm  DOB: December 17, 1950, 67 year old Female  REFERRING:    SSN: [id number]  PROVIDER:  Festus Aloe, M.D.    LOCATION:  [facility] [phone]   --------------------------------------------------------------------------------   CC: I have kidney stones.  HPI: Alison Sullivan is a 67 year-old female established patient who is here for renal calculi.  The problem is on the left side. She first stated noticing pain on 12/05/2017. This is not her first kidney stone. She is currently having flank pain and groin pain. She denies having back pain, nausea, vomiting, fever, and chills. She has not caught a stone in her urine strainer since her symptoms began.   She has had ureteroscopy for treatment of her stones in the past.   Patient has a history of calcium phosphate and calcium oxalate stones. She underwent ureteroscopy in 2011. Her last imaging was a CT in 2016 which revealed 12 right renal stones (2-6 mm), and 9 left stones (2-7 mm). She was never able to take potassium citrate due to the size of the pill.   She developed left flank pain December 09, 2017. This seemed to be a stone on the move. She now has discomfort LLQ. Abx have not changed this pain. She had cysts in the kidneys.   She underwent CT scan today and unofficially this shows a 4 mm distal stone, above this about 11 mm conglomerate of stones, and in the left proximal ureter conglomerate of about 2.1 cm of stone and then 8 mm stone in the left lower pole with severe left hydronephrosis. Right kidney has multiple stones the largest of which is 6 mm in the right lower pole.     CC: Urinary Tract Infections  HPI: The patient occasionally gets a number of urinary tract infections. When the patient has a UTI she reports having the  following symptoms: back pain and cloudy urine. When asked how many UTI's the patient gets per year her response was 1-2. The patient's symptoms do respond favorably to antibiotics.   She took Cipro prophylaxis. She took Cipro recently and urine cleared.      ALLERGIES: Vitamin C TABS - Skin Rash    MEDICATIONS: Simvastatin 20 mg tablet     GU PSH: Cysto Uretero Lithotripsy - 2011 Cystoscopy Insert Stent - 2011 ESWL - 2011      [place] Notes: Cystoscopy With Ureteroscopy With Lithotripsy, Cystoscopy With Insertion Of Ureteral Stent Bilateral, Lithotripsy   NON-GU PSH: None   GU PMH: Abdominal Pain Unspec, Acute left flank pain - 2016 Hemorrhagic cystitis, Acute cystitis with hematuria - 2016 Hemorrhagic cystitis (w/o hematuria), Cystitis without hematuria - 2016 Other microscopic hematuria, Microscopic hematuria - 2016 History of urolithiasis, Nephrolithiasis - 2014 Renal calculus, Bilateral kidney stones - 2014 Ureteral calculus, Calculus of right ureter - 2014, Calculus of left ureter, - 2014    NON-GU PMH: Encounter for general adult medical examination without abnormal findings, Encounter for preventive health examination - 2016    FAMILY HISTORY: Cancer - Father Family Health Status - Mother's Age - Runs In Family Father Deceased At Age35 ___ - Runs In Family Urologic Disorder - Father   SOCIAL HISTORY: Marital Status: Single Preferred Language: English; Ethnicity: Not Hispanic Or Latino; Race: White Current Smoking Status: Patient smokes.   Tobacco Use Assessment Completed: Used Tobacco in last 30 days?  REVIEW OF SYSTEMS:    GU Review Female:   Patient denies frequent urination, hard to postpone urination, burning /pain with urination, get up at night to urinate, leakage of urine, stream starts and stops, trouble starting your stream, have to strain to urinate, and being pregnant.  Gastrointestinal (Upper):   Patient denies nausea, vomiting, and indigestion/  heartburn.  Gastrointestinal (Lower):   Patient denies diarrhea and constipation.  Constitutional:   Patient denies fever, night sweats, weight loss, and fatigue.  Skin:   Patient denies skin rash/ lesion and itching.  Eyes:   Patient denies blurred vision and double vision.  Ears/ Nose/ Throat:   Patient denies sore throat and sinus problems.  Hematologic/Lymphatic:   Patient denies swollen glands and easy bruising.  Cardiovascular:   Patient denies leg swelling and chest pains.  Respiratory:   Patient denies shortness of breath and cough.  Endocrine:   Patient denies excessive thirst.  Musculoskeletal:   Patient reports back pain and joint pain.   Neurological:   Patient denies headaches and dizziness.  Psychologic:   Patient denies depression and anxiety.   VITAL SIGNS:      12/19/2017 10:18 AM  Weight 156 lb / 70.76 kg  Height 66 in / 167.64 cm  BP 144/98 mmHg  Pulse 100 /min  BMI 25.2 kg/m   MULTI-SYSTEM PHYSICAL EXAMINATION:    Constitutional: Well-nourished. No physical deformities. Normally developed. Good grooming.  Neck: Neck symmetrical, not swollen. Normal tracheal position.  Respiratory: No labored breathing, no use of accessory muscles.   Cardiovascular: Normal temperature, normal extremity pulses, no swelling, no varicosities.  Neurologic / Psychiatric: Oriented to time, oriented to place, oriented to person. No depression, no anxiety, no agitation.  Gastrointestinal: No mass, no tenderness, no rigidity, non obese abdomen.     PAST DATA REVIEWED:  Source Of History:  Patient  X-Ray Review: C.T. Abdomen/Pelvis: Reviewed Films. today and 05/2015    PROCEDURES:         C.T. Urogram - [id number]               Urinalysis w/Scope Dipstick Dipstick Cont'd Micro  Color: Yellow Bilirubin: Neg WBC/hpf: >60/hpf  Appearance: Cloudy Ketones: Neg RBC/hpf: 3 - 10/hpf  Specific Gravity: 1.025 Blood: 2+ Bacteria: Rare (0-9/hpf)  pH: 6.0 Protein: 1+ Cystals: NS (Not Seen)   Glucose: Neg Urobilinogen: 0.2 Casts: NS (Not Seen)    Nitrites: Neg Trichomonas: Not Present    Leukocyte Esterase: 3+ Mucous: Present      Epithelial Cells: 0 - 5/hpf      Yeast: NS (Not Seen)      Sperm: Not Present         Ketoralac 30mg  - 24268, [id number] Qty: 30 Adm. By: Geoffery Spruce  Unit: mg Lot No [id number]  Route: IM Exp. Date 05/15/2019  Freq: None Mfgr.:   Site: Right Buttock   ASSESSMENT:      ICD-10 Details  1 GU:   Renal calculus - N20.0   2   Microscopic hematuria - R31.21   4   Ureteral calculus - N20.1   3 NON-GU:   Bacteriuria - R82.71    PLAN:            Medications New Meds: Oxycodone-Acetaminophen 5 mg-325 mg tablet 1 tablet PO Q 6 H   #15  0 Refill(s)            Orders Labs Urine Culture  X-Rays: C.T. Stone Protocol Without Contrast  X-Ray Notes: History:  Hematuria: Yes/No  Patient to see MD after exam: Yes/No  Previous exam: CT / IVP/ US/ KUB/ None  When:  Where:  Diabetic: Yes/ No  BUN/ Creatinine:  Date of last BUN Creatinine:  Weight in pounds:  Allergy- IV Contrast: Yes/ No  Conflicting diabetic meds: Yes/ No  Diabetic Meds:  Prior Authorization #: [id number]           Schedule Return Visit/Planned Activity: Next Available Appointment - Schedule Surgery          Document Letter(s):  Created for Patient: Clinical Summary         Notes:   left ureteral stones - discussed significant stone burden and hydronephrosis and the nature risks and benefits of continued stone passage, off label use of alpha blockers, shockwave lithotripsy, PCNL or ureteroscopy. All questions answered. She'll proceed with LEFT URS and understands she may need a staged procedure.   cc: Dr. Sharlet Salina     ** Signed by Festus Aloe, M.D. on 12/19/17 at 5:06 PM (EST)**     The information contained in this medical record document is considered private and confidential patient information. This information can only be used for the medical diagnosis and/or  medical services that are being provided by the patient's selected caregivers. This information can only be distributed outside of the patient's care if the patient agrees and signs waivers of authorization for this information to be sent to an outside source or route.  Addendum: Her urine culture grew mixed growth.  She was pain-free and came back in for a KUB.  KUB shows several distal left stones are stable and continued left proximal stone.

## 2017-12-28 ENCOUNTER — Ambulatory Visit (HOSPITAL_BASED_OUTPATIENT_CLINIC_OR_DEPARTMENT_OTHER): Payer: Medicare Other | Admitting: Anesthesiology

## 2017-12-28 ENCOUNTER — Encounter (HOSPITAL_BASED_OUTPATIENT_CLINIC_OR_DEPARTMENT_OTHER): Payer: Self-pay

## 2017-12-28 ENCOUNTER — Ambulatory Visit (HOSPITAL_BASED_OUTPATIENT_CLINIC_OR_DEPARTMENT_OTHER)
Admission: RE | Admit: 2017-12-28 | Discharge: 2017-12-28 | Disposition: A | Discharge status: Home / Self Care | Payer: Medicare Other | Source: Ambulatory Visit | Attending: Urology | Admitting: Urology

## 2017-12-28 ENCOUNTER — Encounter (HOSPITAL_BASED_OUTPATIENT_CLINIC_OR_DEPARTMENT_OTHER)
Admission: RE | Disposition: A | Discharge status: Home / Self Care | Payer: Self-pay | Source: Ambulatory Visit | Attending: Urology

## 2017-12-28 DIAGNOSIS — Z87440 Personal history of urinary (tract) infections: Secondary | ICD-10-CM | POA: Insufficient documentation

## 2017-12-28 DIAGNOSIS — R300 Dysuria: Secondary | ICD-10-CM | POA: Diagnosis not present

## 2017-12-28 DIAGNOSIS — R8271 Bacteriuria: Secondary | ICD-10-CM | POA: Insufficient documentation

## 2017-12-28 DIAGNOSIS — R3129 Other microscopic hematuria: Secondary | ICD-10-CM | POA: Insufficient documentation

## 2017-12-28 DIAGNOSIS — N201 Calculus of ureter: Secondary | ICD-10-CM | POA: Diagnosis not present

## 2017-12-28 DIAGNOSIS — N132 Hydronephrosis with renal and ureteral calculous obstruction: Secondary | ICD-10-CM | POA: Diagnosis not present

## 2017-12-28 DIAGNOSIS — Z841 Family history of disorders of kidney and ureter: Secondary | ICD-10-CM | POA: Diagnosis not present

## 2017-12-28 DIAGNOSIS — N281 Cyst of kidney, acquired: Secondary | ICD-10-CM | POA: Insufficient documentation

## 2017-12-28 DIAGNOSIS — F17200 Nicotine dependence, unspecified, uncomplicated: Secondary | ICD-10-CM | POA: Insufficient documentation

## 2017-12-28 DIAGNOSIS — N200 Calculus of kidney: Secondary | ICD-10-CM

## 2017-12-28 HISTORY — DX: Renal tubulo-interstitial disease, unspecified: N15.9

## 2017-12-28 HISTORY — DX: Open bite of unspecified wrist, initial encounter: S61.559A

## 2017-12-28 HISTORY — DX: Unspecified osteoarthritis, unspecified site: M19.90

## 2017-12-28 HISTORY — DX: Other specified disorders of bone density and structure, unspecified site: M85.80

## 2017-12-28 HISTORY — DX: Urinary tract infection, site not specified: N39.0

## 2017-12-28 HISTORY — PX: HOLMIUM LASER APPLICATION: SHX5852

## 2017-12-28 HISTORY — DX: Tubulo-interstitial nephritis, not specified as acute or chronic: N12

## 2017-12-28 HISTORY — PX: CYSTOSCOPY WITH RETROGRADE PYELOGRAM, URETEROSCOPY AND STENT PLACEMENT: SHX5789

## 2017-12-28 HISTORY — DX: Bitten by dog, initial encounter: W54.0XXA

## 2017-12-28 HISTORY — DX: Personal history of urinary calculi: Z87.442

## 2017-12-28 HISTORY — DX: Other specified disorders of kidney and ureter: N28.89

## 2017-12-28 SURGERY — CYSTOURETEROSCOPY, WITH RETROGRADE PYELOGRAM AND STENT INSERTION
Anesthesia: General | Laterality: Left

## 2017-12-28 MED ORDER — ONDANSETRON HCL 4 MG/2ML IJ SOLN
INTRAMUSCULAR | Status: AC
  Filled 2017-12-28: qty 2

## 2017-12-28 MED ORDER — PROMETHAZINE HCL 25 MG/ML IJ SOLN
6.2500 mg | INTRAMUSCULAR | Status: DC | PRN
  Filled 2017-12-28: qty 1

## 2017-12-28 MED ORDER — HYDROCODONE-ACETAMINOPHEN 7.5-325 MG PO TABS
1.0000 | ORAL_TABLET | Freq: Once | ORAL | Status: DC | PRN
  Filled 2017-12-28: qty 1

## 2017-12-28 MED ORDER — LACTATED RINGERS IV SOLN
INTRAVENOUS | Status: DC | PRN
  Administered 2017-12-28: 08:00:00 via INTRAVENOUS

## 2017-12-28 MED ORDER — KETOROLAC TROMETHAMINE 30 MG/ML IJ SOLN
INTRAMUSCULAR | Status: DC | PRN
  Administered 2017-12-28: 15 mg via INTRAVENOUS

## 2017-12-28 MED ORDER — PHENYLEPHRINE 40 MCG/ML (10ML) SYRINGE FOR IV PUSH (FOR BLOOD PRESSURE SUPPORT)
PREFILLED_SYRINGE | INTRAVENOUS | Status: AC
  Filled 2017-12-28: qty 10

## 2017-12-28 MED ORDER — LIDOCAINE 2% (20 MG/ML) 5 ML SYRINGE
INTRAMUSCULAR | Status: AC
  Filled 2017-12-28: qty 5

## 2017-12-28 MED ORDER — ACETAMINOPHEN 10 MG/ML IV SOLN
1000.0000 mg | Freq: Once | INTRAVENOUS | Status: DC | PRN
  Filled 2017-12-28: qty 100

## 2017-12-28 MED ORDER — SODIUM CHLORIDE 0.9 % IV SOLN
INTRAVENOUS | Status: DC
  Administered 2017-12-28: 900 mL via INTRAVENOUS
  Administered 2017-12-28: 1000 mL via INTRAVENOUS
  Filled 2017-12-28: qty 1000

## 2017-12-28 MED ORDER — KETOROLAC TROMETHAMINE 30 MG/ML IJ SOLN
INTRAMUSCULAR | Status: AC
  Filled 2017-12-28: qty 1

## 2017-12-28 MED ORDER — MEPERIDINE HCL 25 MG/ML IJ SOLN
6.2500 mg | INTRAMUSCULAR | Status: DC | PRN
  Filled 2017-12-28: qty 1

## 2017-12-28 MED ORDER — FENTANYL CITRATE (PF) 100 MCG/2ML IJ SOLN
INTRAMUSCULAR | Status: DC | PRN
  Administered 2017-12-28 (×2): 50 ug via INTRAVENOUS

## 2017-12-28 MED ORDER — HYDROMORPHONE HCL 1 MG/ML IJ SOLN
0.2500 mg | INTRAMUSCULAR | Status: DC | PRN
  Filled 2017-12-28: qty 0.5

## 2017-12-28 MED ORDER — WHITE PETROLATUM EX OINT
TOPICAL_OINTMENT | CUTANEOUS | Status: AC
  Filled 2017-12-28: qty 5

## 2017-12-28 MED ORDER — IOHEXOL 300 MG/ML  SOLN
INTRAMUSCULAR | Status: DC | PRN
  Administered 2017-12-28: 15 mL via INTRAVENOUS

## 2017-12-28 MED ORDER — PROPOFOL 10 MG/ML IV BOLUS
INTRAVENOUS | Status: DC | PRN
  Administered 2017-12-28: 150 mg via INTRAVENOUS
  Administered 2017-12-28: 50 mg via INTRAVENOUS

## 2017-12-28 MED ORDER — FENTANYL CITRATE (PF) 100 MCG/2ML IJ SOLN
INTRAMUSCULAR | Status: AC
  Filled 2017-12-28: qty 2

## 2017-12-28 MED ORDER — ONDANSETRON HCL 4 MG/2ML IJ SOLN
INTRAMUSCULAR | Status: DC | PRN
  Administered 2017-12-28: 4 mg via INTRAVENOUS

## 2017-12-28 MED ORDER — SODIUM CHLORIDE 0.9 % IR SOLN
Status: DC | PRN
  Administered 2017-12-28: 1000 mL via INTRAVESICAL
  Administered 2017-12-28: 3000 mL via INTRAVESICAL

## 2017-12-28 MED ORDER — CEFAZOLIN SODIUM-DEXTROSE 2-4 GM/100ML-% IV SOLN
INTRAVENOUS | Status: AC
  Filled 2017-12-28: qty 100

## 2017-12-28 MED ORDER — DEXAMETHASONE SODIUM PHOSPHATE 10 MG/ML IJ SOLN
INTRAMUSCULAR | Status: DC | PRN
  Administered 2017-12-28: 10 mg via INTRAVENOUS

## 2017-12-28 MED ORDER — PROPOFOL 10 MG/ML IV BOLUS
INTRAVENOUS | Status: AC
  Filled 2017-12-28: qty 40

## 2017-12-28 MED ORDER — PHENYLEPHRINE 40 MCG/ML (10ML) SYRINGE FOR IV PUSH (FOR BLOOD PRESSURE SUPPORT)
PREFILLED_SYRINGE | INTRAVENOUS | Status: DC | PRN
  Administered 2017-12-28: 40 ug via INTRAVENOUS
  Administered 2017-12-28 (×3): 80 ug via INTRAVENOUS

## 2017-12-28 MED ORDER — CEPHALEXIN 500 MG PO CAPS: 500.0000 mg | ORAL_CAPSULE | Freq: Two times a day (BID) | ORAL | 0 refills | Status: DC

## 2017-12-28 MED ORDER — CEFAZOLIN SODIUM-DEXTROSE 2-4 GM/100ML-% IV SOLN
2.0000 g | Freq: Once | INTRAVENOUS | Status: AC
  Administered 2017-12-28: 2 g via INTRAVENOUS
  Filled 2017-12-28: qty 100

## 2017-12-28 MED ORDER — LIDOCAINE 2% (20 MG/ML) 5 ML SYRINGE
INTRAMUSCULAR | Status: DC | PRN
  Administered 2017-12-28: 100 mg via INTRAVENOUS

## 2017-12-28 MED ORDER — DEXAMETHASONE SODIUM PHOSPHATE 10 MG/ML IJ SOLN
INTRAMUSCULAR | Status: AC
  Filled 2017-12-28: qty 1

## 2017-12-28 SURGICAL SUPPLY — 41 items
BAG DRAIN URO-CYSTO SKYTR STRL (DRAIN) ×3 IMPLANT
BAG DRN UROCATH (DRAIN) ×1
BASKET LASER NITINOL 1.9FR (BASKET) IMPLANT
BASKET STNLS GEMINI 4WIRE 3FR (BASKET) IMPLANT
BASKET ZERO TIP NITINOL 2.4FR (BASKET) ×2 IMPLANT
BSKT STON RTRVL 120 1.9FR (BASKET)
BSKT STON RTRVL GEM 120X11 3FR (BASKET)
BSKT STON RTRVL ZERO TP 2.4FR (BASKET) ×1
CATH INTERMIT  6FR 70CM (CATHETERS) IMPLANT
CATH URET 5FR 28IN CONE TIP (BALLOONS)
CATH URET 5FR 28IN OPEN ENDED (CATHETERS) ×2 IMPLANT
CATH URET 5FR 70CM CONE TIP (BALLOONS) IMPLANT
CATH URET DUAL LUMEN 6-10FR 50 (CATHETERS) IMPLANT
CLOTH BEACON ORANGE TIMEOUT ST (SAFETY) ×3 IMPLANT
ELECT REM PT RETURN 9FT ADLT (ELECTROSURGICAL)
ELECTRODE REM PT RTRN 9FT ADLT (ELECTROSURGICAL) IMPLANT
FIBER LASER TRAC TIP (UROLOGICAL SUPPLIES) ×2 IMPLANT
GLOVE BIO SURGEON STRL SZ7.5 (GLOVE) ×3 IMPLANT
GOWN STRL REUS W/ TWL LRG LVL3 (GOWN DISPOSABLE) ×1 IMPLANT
GOWN STRL REUS W/ TWL XL LVL3 (GOWN DISPOSABLE) ×1 IMPLANT
GOWN STRL REUS W/TWL LRG LVL3 (GOWN DISPOSABLE) ×3
GOWN STRL REUS W/TWL XL LVL3 (GOWN DISPOSABLE) ×3
GUIDEWIRE 0.038 PTFE COATED (WIRE) ×3 IMPLANT
GUIDEWIRE ANG ZIPWIRE 038X150 (WIRE) ×2 IMPLANT
GUIDEWIRE STR DUAL SENSOR (WIRE) ×3 IMPLANT
INFUSOR MANOMETER BAG 3000ML (MISCELLANEOUS) ×3 IMPLANT
IV NS IRRIG 3000ML ARTHROMATIC (IV SOLUTION) ×6 IMPLANT
KIT BALLIN UROMAX 15FX10 (LABEL) IMPLANT
KIT BALLN UROMAX 15FX4 (MISCELLANEOUS) IMPLANT
KIT BALLN UROMAX 26 75X4 (MISCELLANEOUS)
KIT RM TURNOVER CYSTO AR (KITS) ×3 IMPLANT
MANIFOLD NEPTUNE II (INSTRUMENTS) IMPLANT
NS IRRIG 500ML POUR BTL (IV SOLUTION) ×3 IMPLANT
PACK CYSTO (CUSTOM PROCEDURE TRAY) ×3 IMPLANT
SET HIGH PRES BAL DIL (LABEL)
SHEATH ACCESS URETERAL 38CM (SHEATH) IMPLANT
SHEATH URETERAL 12FRX35CM (MISCELLANEOUS) ×2 IMPLANT
STENT URET 6FRX26 CONTOUR (STENTS) ×2 IMPLANT
SYRINGE IRR TOOMEY STRL 70CC (SYRINGE) IMPLANT
TUBE CONNECTING 12'X1/4 (SUCTIONS)
TUBE CONNECTING 12X1/4 (SUCTIONS) IMPLANT

## 2017-12-28 NOTE — Discharge Instructions (Signed)
Ureteral Stent Implantation, Care After Refer to this sheet in the next few weeks. These instructions provide you with information about caring for yourself after your procedure. Your health care provider may also give you more specific instructions. Your treatment has been planned according to current medical practices, but problems sometimes occur. Call your health care provider if you have any problems or questions after your procedure.  Removal of the stent: Remove the stent by pulling the string Monday morning, January 01, 2018.  What can I expect after the procedure? After the procedure, it is common to have:  Nausea.  Mild pain when you urinate. You may feel this pain in your lower back or lower abdomen. Pain should stop within a few minutes after you urinate. This may last for up to 1 week.  A small amount of blood in your urine for several days.  Follow these instructions at home:  Medicines  Take over-the-counter and prescription medicines only as told by your health care provider.  If you were prescribed an antibiotic medicine, take it as told by your health care provider. Do not stop taking the antibiotic even if you start to feel better.  Do not drive for 24 hours if you received a sedative.  Do not drive or operate heavy machinery while taking prescription pain medicines.  Do not take any nonsteroidal anti inflammatories (Ibuprofen, Advil, Aloeve, Motrin) until after 1:30 pm. Activity  Return to your normal activities as told by your health care provider. Ask your health care provider what activities are safe for you.  Do not lift anything that is heavier than 10 lb (4.5 kg). Follow this limit for 1 week after your procedure, or for as long as told by your health care provider. General instructions  Watch for any blood in your urine. Call your health care provider if the amount of blood in your urine increases.  If you have a catheter: ? Follow instructions from  your health care provider about taking care of your catheter and collection bag. ? Do not take baths, swim, or use a hot tub until your health care provider approves.  Drink enough fluid to keep your urine clear or pale yellow.  Keep all follow-up visits as told by your health care provider. This is important. Contact a health care provider if:  You have pain that gets worse or does not get better with medicine, especially pain when you urinate.  You have difficulty urinating.  You feel nauseous or you vomit repeatedly during a period of more than 2 days after the procedure. Get help right away if:  Your urine is dark red or has blood clots in it.  You are leaking urine (have incontinence).  The end of the stent comes out of your urethra.  You cannot urinate.  You have sudden, sharp, or severe pain in your abdomen or lower back.  You have a fever. This information is not intended to replace advice given to you by your health care provider. Make sure you discuss any questions you have with your health care provider. Document Released: 07/03/2013 Document Revised: 04/07/2016 Document Reviewed: 05/15/2015 Elsevier Interactive Patient Education  2018 [facility] Care Instructions  Activity: Get plenty of rest for the remainder of the day. A responsible individual must stay with you for 24 hours following the procedure.  For the next 24 hours, DO NOT: -Drive a car -Paediatric nurse -Drink alcoholic beverages -Take any medication unless  instructed by your physician -Make any legal decisions or sign important papers.  Meals: Start with liquid foods such as gelatin or soup. Progress to regular foods as tolerated. Avoid greasy, spicy, heavy foods. If nausea and/or vomiting occur, drink only clear liquids until the nausea and/or vomiting subsides. Call your physician if vomiting continues.  Special Instructions/Symptoms: Your throat may feel dry or  sore from the anesthesia or the breathing tube placed in your throat during surgery. If this causes discomfort, gargle with warm salt water. The discomfort should disappear within 24 hours.

## 2017-12-28 NOTE — Progress Notes (Signed)
Dr. Junious Silk by to speak with patient and see red splotches.  Pt instructed to watch red splotches on left upper arm and report any worsening.

## 2017-12-28 NOTE — Anesthesia Postprocedure Evaluation (Signed)
Anesthesia Post Note  Patient: Alison Sullivan  Procedure(s) Performed: CYSTOSCOPY WITH RETROGRADE PYELOGRAM, URETEROSCOPY AND STENT PLACEMENT, STONE BASKETRY (Left ) HOLMIUM LASER APPLICATION (Left )     Patient location during evaluation: PACU Anesthesia Type: General Level of consciousness: awake and alert Pain management: pain level controlled Vital Signs Assessment: post-procedure vital signs reviewed and stable Respiratory status: spontaneous breathing, nonlabored ventilation, respiratory function stable and patient connected to nasal cannula oxygen Cardiovascular status: blood pressure returned to baseline and stable Postop Assessment: no apparent nausea or vomiting Anesthetic complications: no    Last Vitals:  Vitals:   12/28/17 1041 12/28/17 1120  BP: (!) 108/59 (!) 107/59  Pulse: 74 76  Resp: 17 16  Temp:  36.6 C  SpO2: 94% 94%    Last Pain:  Vitals:   12/28/17 1120  TempSrc: Oral  PainSc:                  Barnet Glasgow

## 2017-12-28 NOTE — Op Note (Signed)
Preoperative diagnosis: Left ureteral and renal stones Postoperative diagnosis: Same  Procedure: Cystoscopy, left retrograde pyelogram, left ureteroscopy with holmium laser lithotripsy, stone basket extraction and left ureteral stent placement  Surgeon: Junious Silk  Anesthesia: General  Indication for procedure: 67 year old female with multiple left ureteral stones, renal stones and severe left hydroureteronephrosis.  Findings: On cystoscopy there was no stone or foreign body in the bladder.  There were stones crowning at the left ureteral orifice.  Cystoscopy otherwise unremarkable.  On left ureteroscopy there were 3 stones in the left distal ureter which were fragmented and removed, one large stone in the left proximal ureter fragmented and removed, 2 smaller stones above this removed and a tortuous dilated ureter just under the renal pelvis, a small upper and mid pole stone removed from the kidney, 2 larger stone fragments from the lower pole removed.  There was quite a bit of edema of the distal ureter and proximal ureter where the stones had been sitting.  Left retrograde pyelogram-this outlined a filling defect in the left distal ureter consistent with the stones and the filling defect in the left proximal ureter a few centimeters above the iliac consistent with the known stone in the ureter with severe dilation of the renal pelvis and calyces.   Description of procedure: After consent was obtained the patient was brought to the operating room.  After adequate anesthesia she was placed in lithotomy position and prepped and draped in the usual sterile fashion.  A timeout was performed to confirm the patient and procedure.  The cystoscope was passed per urethra and the bladder inspected.  Irrigated the bladder several times to clear it.  The urine was clear but concentrated.  I slipped a 5 [place] open-ended catheter adjacent to the stone at the left ureteral orifice and retrograde injection of  contrast was performed.  I then passed a sensor wire and coiled this in the upper pole.  I then passed the 6 French up into the proximal ureter and removed the wire there was a brisk hydronephrotic drip.  Gentle retrograde continued to ensure opacification of the collecting system which was confirmed.  The sensor wire was then coiled in the upper pole and the 5 [place] open-ended catheter removed.  A semirigid ureteroscope was passed after the bladder was drained.  The intramural tunnel was quite dilated on the left and the stone was encountered and fragmented and removed.  I used a 200 m laser fiber with the left paddle at 0.4 and 53 in the right pedicle at 0.8 and 8.  I then encountered 2 more stones in the left distal ureter which were fragmented and sequentially removed.  I then made my way up toward the proximal stone but resistance developed is then made the turn over the iliacs therefore thought it was best to place a ureteral access sheath which would also provide some drainage.  The semirigid was backed out and the cystoscope replaced guiding a Glidewire into the left ureteral orifice and coiled this in the collecting system as well.  Now over the sensor wire ureteral access sheath went without difficulty to the proximal stone.  The digital ureteroscope was advanced to the stone and the stone was fragmented nicely.  The pieces washed up into the dilated area under the left UPJ where 2 other small stones were located.  All the fragments were sequentially removed as well as the stones.  If the stone was too large I dropped it back and continued lithotripsy.  Also  it worked well to rotate the stone in the basket and laser it while holding it at the end of the ureteroscope.  Having cleared the ureter I was able to advance the ureteroscope through the tortuosity up into the collecting system which was massively dilated.  I then removed the ureteroscope and reinserted the ureteral access sheath inner cannula and  passed the sensor wire back through it.  I then advanced the ureter access sheath up to the ureteropelvic junction and remove the wire and inner sheath.  The collecting system drained through the sheath like a bladder.  The ureteroscope was then advanced in the collecting system inspected.  Because it was so massively dilated it was difficult to get the scope to advance and not simply flex and slide around the collecting system.  I was able to get to the lower pole stones but they were off to the edge of the calyx but were able to grab 2 large pieces and remove the sequentially fragmenting when necessary.  No other stones were noted there.  A small stone was noted adherent to a calyx in the midpole and one in the upper pole.  Further inspection noted there to be no other stones although the system was a dilated and there was a lot of debris.  The urine was not cloudy but a lot of debris.  Having sufficiently inspected and clear the collecting system I checked the renal pelvis which was normal as well as the UPJ normal without injury but dilated.  The tortuosity and dilation in the proximal ureter was inspected and noted to be without injury or other stone fragment as I backed out the sheath and the ureteroscope together.  The remainder of the ureter inspected and noted to be without significant fragment or injury.  The Glidewire was backloaded on the cystoscope and a 626 stent was advanced.  The wire was removed with a good coil seen in the kidney and a good coil in the bladder.  Her bladder was drained and the scope removed.  She was awakened taken the recovery room in stable condition.  Complications: None  Blood loss: Minimal  Specimens: Stone fragments to patient  Drains: 6 x 26 cm left ureteral stent with string  Disposition: Patient stable to PACU

## 2017-12-28 NOTE — Transfer of Care (Signed)
Immediate Anesthesia Transfer of Care Note  Patient: Alison Sullivan  Procedure(s) Performed: CYSTOSCOPY WITH RETROGRADE PYELOGRAM, URETEROSCOPY AND STENT PLACEMENT, STONE BASKETRY (Left ) HOLMIUM LASER APPLICATION (Left )  Patient Location: PACU  Anesthesia Type:General  Level of Consciousness: drowsy  Airway & Oxygen Therapy: Patient Spontanous Breathing and Patient connected to nasal cannula oxygen  Post-op Assessment: Report given to RN  Post vital signs: Reviewed and stable  Last Vitals:  Vitals:   12/28/17 0533 12/28/17 0925  BP: 124/72 (!) (P) 105/58  Pulse: 100 80  Resp: 16 11  Temp: 36.7 C (P) 36.8 C  SpO2: 96% 95%    Last Pain:  Vitals:   12/28/17 0533  TempSrc: Oral      Patients Stated Pain Goal: 7 (16/10/96 0454)  Complications: No apparent anesthesia complications

## 2017-12-28 NOTE — Interval H&P Note (Signed)
History and Physical Interval Note:  12/28/2017 7:25 AM  [name]  has presented today for surgery, with the diagnosis of LEFT RENAL AND URETERAL STONES  The various methods of treatment have been discussed with the patient and family. After consideration of risks, benefits and other options for treatment, the patient has consented to  Procedure(s): CYSTOSCOPY WITH RETROGRADE PYELOGRAM, URETEROSCOPY AND STENT PLACEMENT (Left) HOLMIUM LASER APPLICATION (Left) as a surgical intervention . Pt has not passed a stone. No fever, dysuria or gross hematuria. I discussed with the patient and her son the nature, potential benefits, risks and alternatives to cystoscopy, left RGP, left URS and laser lithotripsy with stent placement, including side effects of the proposed treatment, the likelihood of the patient achieving the goals of the procedure, and any potential problems that might occur during the procedure or recuperation. Discussed she may need a staged procedure. The patient's history has been reviewed, patient examined, no change in status, stable for surgery.  I have reviewed the patient's chart and labs.  Questions were answered to the patient's satisfaction. She elects to proceed.    Festus Aloe

## 2017-12-28 NOTE — Anesthesia Procedure Notes (Signed)
Procedure Name: LMA Insertion Date/Time: 12/28/2017 7:30 AM Performed by: Bonney Aid, CRNA Pre-anesthesia Checklist: Patient identified, Emergency Drugs available, Suction available and Patient being monitored Patient Re-evaluated:Patient Re-evaluated prior to induction Oxygen Delivery Method: Circle system utilized Preoxygenation: Pre-oxygenation with 100% oxygen Induction Type: IV induction Ventilation: Mask ventilation without difficulty LMA: LMA inserted LMA Size: 4.0 Number of attempts: 1 Airway Equipment and Method: Bite block Placement Confirmation: positive ETCO2 Tube secured with: Tape Dental Injury: Teeth and Oropharynx as per pre-operative assessment

## 2017-12-29 ENCOUNTER — Encounter (HOSPITAL_BASED_OUTPATIENT_CLINIC_OR_DEPARTMENT_OTHER): Payer: Self-pay | Admitting: Urology

## 2017-12-29 LAB — POCT HEMOGLOBIN-HEMACUE: Hemoglobin: 15.5 g/dL — ABNORMAL HIGH (ref 12.0–15.0)

## 2017-12-29 NOTE — Progress Notes (Signed)
Describes constant leak of urine. Instructed to call Dr. Junious Silk for potential stent dislodgement and removal.

## 2018-01-02 ENCOUNTER — Ambulatory Visit (HOSPITAL_BASED_OUTPATIENT_CLINIC_OR_DEPARTMENT_OTHER): Admission: RE | Admit: 2018-01-02 | Payer: Medicare Other | Source: Ambulatory Visit | Admitting: Urology

## 2018-01-02 ENCOUNTER — Encounter (HOSPITAL_BASED_OUTPATIENT_CLINIC_OR_DEPARTMENT_OTHER): Admission: RE | Payer: Self-pay | Source: Ambulatory Visit

## 2018-01-02 SURGERY — CYSTOSCOPY/URETEROSCOPY/HOLMIUM LASER/STENT PLACEMENT
Anesthesia: General | Laterality: Left

## 2018-01-05 ENCOUNTER — Telehealth: Payer: Self-pay | Admitting: Internal Medicine

## 2018-01-05 NOTE — Telephone Encounter (Unsigned)
Copied from [place]. Topic: Quick Communication - See Telephone Encounter >> Jan 05, 2018  3:09 PM Hewitt Shorts wrote: CRM for notification. See Telephone encounter for:   01/05/18.pt has been given antibiotics a lot lately for surgery and a recent ov and is needing something for a yeast infection   Best number [phone] [facility] [address]

## 2018-01-06 NOTE — Telephone Encounter (Signed)
LOV with Dr. Sharlet Salina on02/22/18.  Patient has had some acute visits with other providers / Patient is requesting something for yeast d/t antibiotic use /

## 2018-01-08 ENCOUNTER — Encounter: Payer: Self-pay | Admitting: Internal Medicine

## 2018-01-08 ENCOUNTER — Ambulatory Visit (INDEPENDENT_AMBULATORY_CARE_PROVIDER_SITE_OTHER): Payer: Medicare Other | Admitting: Internal Medicine

## 2018-01-08 ENCOUNTER — Other Ambulatory Visit (INDEPENDENT_AMBULATORY_CARE_PROVIDER_SITE_OTHER): Payer: Medicare Other

## 2018-01-08 VITALS — BP 110/80 | HR 78 | Temp 97.9°F | Ht 67.0 in | Wt 151.0 lb

## 2018-01-08 DIAGNOSIS — E785 Hyperlipidemia, unspecified: Secondary | ICD-10-CM

## 2018-01-08 DIAGNOSIS — Z0000 Encounter for general adult medical examination without abnormal findings: Secondary | ICD-10-CM | POA: Diagnosis not present

## 2018-01-08 DIAGNOSIS — F5101 Primary insomnia: Secondary | ICD-10-CM

## 2018-01-08 LAB — COMPREHENSIVE METABOLIC PANEL
ALK PHOS: 104 U/L (ref 39–117)
ALT: 15 U/L (ref 0–35)
AST: 18 U/L (ref 0–37)
Albumin: 3.8 g/dL (ref 3.5–5.2)
BUN: 19 mg/dL (ref 6–23)
CHLORIDE: 105 meq/L (ref 96–112)
CO2: 26 mEq/L (ref 19–32)
Calcium: 9.7 mg/dL (ref 8.4–10.5)
Creatinine, Ser: 0.84 mg/dL (ref 0.40–1.20)
GFR: 71.94 mL/min (ref >60.00)
GLUCOSE: 123 mg/dL — AB (ref 70–99)
POTASSIUM: 3.9 meq/L (ref 3.5–5.1)
SODIUM: 142 meq/L (ref 135–145)
TOTAL PROTEIN: 7.5 g/dL (ref 6.0–8.3)
Total Bilirubin: 0.7 mg/dL (ref 0.2–1.2)

## 2018-01-08 LAB — LIPID PANEL
CHOL/HDL RATIO: 6
Cholesterol: 166 mg/dL (ref 0–200)
HDL: 28.7 mg/dL — AB (ref >39.00)
NONHDL: 137.24
TRIGLYCERIDES: 259 mg/dL — AB (ref 0.0–149.0)
VLDL: 51.8 mg/dL — AB (ref 0.0–40.0)

## 2018-01-08 LAB — CBC
HCT: 47.1 % — ABNORMAL HIGH (ref 36.0–46.0)
HEMOGLOBIN: 15.8 g/dL — AB (ref 12.0–15.0)
MCHC: 33.7 g/dL (ref 30.0–36.0)
MCV: 89.8 fl (ref 78.0–100.0)
PLATELETS: 457 10*3/uL — AB (ref 150.0–400.0)
RBC: 5.24 Mil/uL — AB (ref 3.87–5.11)
RDW: 13.1 % (ref 11.5–15.5)
WBC: 12.8 10*3/uL — AB (ref 4.0–10.5)

## 2018-01-08 LAB — LDL CHOLESTEROL, DIRECT: Direct LDL: 99 mg/dL

## 2018-01-08 MED ORDER — FLUCONAZOLE 150 MG PO TABS: 150.0000 mg | ORAL_TABLET | Freq: Once | ORAL | 0 refills | Status: AC

## 2018-01-08 NOTE — Patient Instructions (Addendum)
We will check the labs today and call you back about the results.  The diflucan for the yeast infection is at your pharmacy.  We will check the circulation in a couple of months just to make sure that this is not causing the numbness.   Health Maintenance, Female Adopting a healthy lifestyle and getting preventive care can go a long way to promote health and wellness. Talk with your health care provider about what schedule of regular examinations is right for you. This is a good chance for you to check in with your provider about disease prevention and staying healthy. In between checkups, there are plenty of things you can do on your own. Experts have done a lot of research about which lifestyle changes and preventive measures are most likely to keep you healthy. Ask your health care provider for more information. Weight and diet Eat a healthy diet  Be sure to include plenty of vegetables, fruits, low-fat dairy products, and lean protein.  Do not eat a lot of foods high in solid fats, added sugars, or salt.  Get regular exercise. This is one of the most important things you can do for your health. ? Most adults should exercise for at least 150 minutes each week. The exercise should increase your heart rate and make you sweat (moderate-intensity exercise). ? Most adults should also do strengthening exercises at least twice a week. This is in addition to the moderate-intensity exercise.  Maintain a healthy weight  Body mass index (BMI) is a measurement that can be used to identify possible weight problems. It estimates body fat based on height and weight. Your health care provider can help determine your BMI and help you achieve or maintain a healthy weight.  For females 48 years of age and older: ? A BMI below 18.5 is considered underweight. ? A BMI of 18.5 to 24.9 is normal. ? A BMI of 25 to 29.9 is considered overweight. ? A BMI of 30 and above is considered obese.  Watch levels of  cholesterol and blood lipids  You should start having your blood tested for lipids and cholesterol at 67 years of age, then have this test every 5 years.  You may need to have your cholesterol levels checked more often if: ? Your lipid or cholesterol levels are high. ? You are older than 67 years of age. ? You are at high risk for heart disease.  Cancer screening Lung Cancer  Lung cancer screening is recommended for adults 69-53 years old who are at high risk for lung cancer because of a history of smoking.  A yearly low-dose CT scan of the lungs is recommended for people who: ? Currently smoke. ? Have quit within the past 15 years. ? Have at least a 30-pack-year history of smoking. A pack year is smoking an average of one pack of cigarettes a day for 1 year.  Yearly screening should continue until it has been 15 years since you quit.  Yearly screening should stop if you develop a health problem that would prevent you from having lung cancer treatment.  Breast Cancer  Practice breast self-awareness. This means understanding how your breasts normally appear and feel.  It also means doing regular breast self-exams. Let your health care provider know about any changes, no matter how small.  If you are in your 20s or 30s, you should have a clinical breast exam (CBE) by a health care provider every 1-3 years as part of a regular health  exam.  If you are 40 or older, have a CBE every year. Also consider having a breast X-ray (mammogram) every year.  If you have a family history of breast cancer, talk to your health care provider about genetic screening.  If you are at high risk for breast cancer, talk to your health care provider about having an MRI and a mammogram every year.  Breast cancer gene (BRCA) assessment is recommended for women who have family members with BRCA-related cancers. BRCA-related cancers include: ? Breast. ? Ovarian. ? Tubal. ? Peritoneal cancers.  Results  of the assessment will determine the need for genetic counseling and BRCA1 and BRCA2 testing.  Cervical Cancer Your health care provider may recommend that you be screened regularly for cancer of the pelvic organs (ovaries, uterus, and vagina). This screening involves a pelvic examination, including checking for microscopic changes to the surface of your cervix (Pap test). You may be encouraged to have this screening done every 3 years, beginning at age 69.  For women ages 14-65, health care providers may recommend pelvic exams and Pap testing every 3 years, or they may recommend the Pap and pelvic exam, combined with testing for human papilloma virus (HPV), every 5 years. Some types of HPV increase your risk of cervical cancer. Testing for HPV may also be done on women of any age with unclear Pap test results.  Other health care providers may not recommend any screening for nonpregnant women who are considered low risk for pelvic cancer and who do not have symptoms. Ask your health care provider if a screening pelvic exam is right for you.  If you have had past treatment for cervical cancer or a condition that could lead to cancer, you need Pap tests and screening for cancer for at least 20 years after your treatment. If Pap tests have been discontinued, your risk factors (such as having a new sexual partner) need to be reassessed to determine if screening should resume. Some women have medical problems that increase the chance of getting cervical cancer. In these cases, your health care provider may recommend more frequent screening and Pap tests.  Colorectal Cancer  This type of cancer can be detected and often prevented.  Routine colorectal cancer screening usually begins at 67 years of age and continues through 67 years of age.  Your health care provider may recommend screening at an earlier age if you have risk factors for colon cancer.  Your health care provider may also recommend using  home test kits to check for hidden blood in the stool.  A small camera at the end of a tube can be used to examine your colon directly (sigmoidoscopy or colonoscopy). This is done to check for the earliest forms of colorectal cancer.  Routine screening usually begins at age 20.  Direct examination of the colon should be repeated every 5-10 years through 67 years of age. However, you may need to be screened more often if early forms of precancerous polyps or small growths are found.  Skin Cancer  Check your skin from head to toe regularly.  Tell your health care provider about any new moles or changes in moles, especially if there is a change in a mole's shape or color.  Also tell your health care provider if you have a mole that is larger than the size of a pencil eraser.  Always use sunscreen. Apply sunscreen liberally and repeatedly throughout the day.  Protect yourself by wearing long sleeves, pants, a wide-brimmed  hat, and sunglasses whenever you are outside.  Heart disease, diabetes, and high blood pressure  High blood pressure causes heart disease and increases the risk of stroke. High blood pressure is more likely to develop in: ? People who have blood pressure in the high end of the normal range (130-139/85-89 mm Hg). ? People who are overweight or obese. ? People who are African American.  If you are 75-31 years of age, have your blood pressure checked every 3-5 years. If you are 62 years of age or older, have your blood pressure checked every year. You should have your blood pressure measured twice-once when you are at a hospital or clinic, and once when you are not at a hospital or clinic. Record the average of the two measurements. To check your blood pressure when you are not at a hospital or clinic, you can use: ? An automated blood pressure machine at a pharmacy. ? A home blood pressure monitor.  If you are between 53 years and 66 years old, ask your health care provider  if you should take aspirin to prevent strokes.  Have regular diabetes screenings. This involves taking a blood sample to check your fasting blood sugar level. ? If you are at a normal weight and have a low risk for diabetes, have this test once every three years after 67 years of age. ? If you are overweight and have a high risk for diabetes, consider being tested at a younger age or more often. Preventing infection Hepatitis B  If you have a higher risk for hepatitis B, you should be screened for this virus. You are considered at high risk for hepatitis B if: ? You were born in a country where hepatitis B is common. Ask your health care provider which countries are considered high risk. ? Your parents were born in a high-risk country, and you have not been immunized against hepatitis B (hepatitis B vaccine). ? You have HIV or AIDS. ? You use needles to inject street drugs. ? You live with someone who has hepatitis B. ? You have had sex with someone who has hepatitis B. ? You get hemodialysis treatment. ? You take certain medicines for conditions, including cancer, organ transplantation, and autoimmune conditions.  Hepatitis C  Blood testing is recommended for: ? Everyone born from [birth year] through 1965. ? Anyone with known risk factors for hepatitis C.  Sexually transmitted infections (STIs)  You should be screened for sexually transmitted infections (STIs) including gonorrhea and chlamydia if: ? You are sexually active and are younger than 67 years of age. ? You are older than 67 years of age and your health care provider tells you that you are at risk for this type of infection. ? Your sexual activity has changed since you were last screened and you are at an increased risk for chlamydia or gonorrhea. Ask your health care provider if you are at risk.  If you do not have HIV, but are at risk, it may be recommended that you take a prescription medicine daily to prevent HIV infection. This  is called pre-exposure prophylaxis (PrEP). You are considered at risk if: ? You are sexually active and do not regularly use condoms or know the HIV status of your partner(s). ? You take drugs by injection. ? You are sexually active with a partner who has HIV.  Talk with your health care provider about whether you are at high risk of being infected with HIV. If you choose to begin  PrEP, you should first be tested for HIV. You should then be tested every 3 months for as long as you are taking PrEP. Pregnancy  If you are premenopausal and you may become pregnant, ask your health care provider about preconception counseling.  If you may become pregnant, take 400 to 800 micrograms (mcg) of folic acid every day.  If you want to prevent pregnancy, talk to your health care provider about birth control (contraception). Osteoporosis and menopause  Osteoporosis is a disease in which the bones lose minerals and strength with aging. This can result in serious bone fractures. Your risk for osteoporosis can be identified using a bone density scan.  If you are 47 years of age or older, or if you are at risk for osteoporosis and fractures, ask your health care provider if you should be screened.  Ask your health care provider whether you should take a calcium or vitamin D supplement to lower your risk for osteoporosis.  Menopause may have certain physical symptoms and risks.  Hormone replacement therapy may reduce some of these symptoms and risks. Talk to your health care provider about whether hormone replacement therapy is right for you. Follow these instructions at home:  Schedule regular health, dental, and eye exams.  Stay current with your immunizations.  Do not use any tobacco products including cigarettes, chewing tobacco, or electronic cigarettes.  If you are pregnant, do not drink alcohol.  If you are breastfeeding, limit how much and how often you drink alcohol.  Limit alcohol intake  to no more than 1 drink per day for nonpregnant women. One drink equals 12 ounces of beer, 5 ounces of wine, or 1 ounces of hard liquor.  Do not use street drugs.  Do not share needles.  Ask your health care provider for help if you need support or information about quitting drugs.  Tell your health care provider if you often feel depressed.  Tell your health care provider if you have ever been abused or do not feel safe at home. This information is not intended to replace advice given to you by your health care provider. Make sure you discuss any questions you have with your health care provider. Document Released: 05/16/2011 Document Revised: 04/07/2016 Document Reviewed: 08/04/2015 Elsevier Interactive Patient Education  [employer].

## 2018-01-08 NOTE — Telephone Encounter (Signed)
Have sent in diflucan to take.

## 2018-01-08 NOTE — Progress Notes (Signed)
   Subjective:    Patient ID: Alison Sullivan, female    DOB: 1951/07/21, 67 y.o.   MRN: [id number]  HPI Here for medicare wellness, no new complaints. Please see A/P for status and treatment of chronic medical problems.   HPI #2: Here for follow up of cholesterol (taking simvastatin daily, denies side effects, denies chest pains or SOB or muscle aches) and her kidney stones (has had some flares recently and following with urology for stones, currently doing okay, she did have a poor experience in the office with Saturday clinic and felt dismissed).  Diet: heart healthy Physical activity: sedentary Depression/mood screen: negative Hearing: intact to whispered voice Visual acuity: grossly normal, performs annual eye exam  ADLs: capable Fall risk: none Home safety: good Cognitive evaluation: intact to orientation, naming, recall and repetition EOL planning: adv directives discussed  I have personally reviewed and have noted 1. The patient's medical and social history - reviewed today no changes 2. Their use of alcohol, tobacco or illicit drugs 3. Their current medications and supplements 4. The patient's functional ability including ADL's, fall risks, home safety risks and hearing or visual impairment. 5. Diet and physical activities 6. Evidence for depression or mood disorders 7. Care team reviewed and updated (available in snapshot)  Review of Systems  Constitutional: Negative.   HENT: Negative.   Eyes: Negative.   Respiratory: Negative for cough, chest tightness and shortness of breath.   Cardiovascular: Negative for chest pain, palpitations and leg swelling.  Gastrointestinal: Negative for abdominal distention, abdominal pain, constipation, diarrhea, nausea and vomiting.  Musculoskeletal: Negative.   Skin: Negative.   Neurological: Negative.   Psychiatric/Behavioral: Negative.       Objective:   Physical Exam  Constitutional: She is oriented to person, place, and time.  She appears well-developed and well-nourished.  HENT:  Head: Normocephalic and atraumatic.  Eyes: EOM are normal.  Neck: Normal range of motion.  Cardiovascular: Normal rate and regular rhythm.  Pulmonary/Chest: Effort normal and breath sounds normal. No respiratory distress. She has no wheezes. She has no rales.  Abdominal: Soft. Bowel sounds are normal. She exhibits no distension. There is no tenderness. There is no rebound.  Musculoskeletal: She exhibits no edema.  Neurological: She is alert and oriented to person, place, and time. Coordination normal.  Skin: Skin is warm and dry.  Psychiatric: She has a normal mood and affect.   Vitals:   01/08/18 1014  BP: 110/80  Pulse: 78  Temp: 97.9 F (36.6 C)  TempSrc: Oral  SpO2: 96%  Weight: 151 lb (68.5 kg)  Height: 5\' 7"  (1.702 m)      Assessment & Plan:

## 2018-01-08 NOTE — Telephone Encounter (Signed)
LVM informing patient Rx was sent to [facility]

## 2018-01-10 DIAGNOSIS — E785 Hyperlipidemia, unspecified: Secondary | ICD-10-CM | POA: Insufficient documentation

## 2018-01-10 NOTE — Assessment & Plan Note (Signed)
Taking simvastatin and checking lipid panel and adjust as needed.

## 2018-01-10 NOTE — Assessment & Plan Note (Signed)
Mammogram and pneumonia up to date. Tetanus and flu up to date. Counseled about shingrix and she wants to think about it. With recent illness has not gotten around to colon cancer screening and declines today. Counseled about sun safety and mole surveillance. Given 10 year screening recommendations.

## 2018-01-10 NOTE — Assessment & Plan Note (Signed)
Taking melatonin and doing well.

## 2018-01-23 ENCOUNTER — Telehealth: Payer: Self-pay | Admitting: Internal Medicine

## 2018-01-23 NOTE — Telephone Encounter (Signed)
Fine with me

## 2018-01-23 NOTE — Telephone Encounter (Signed)
Spoke to pt regarding her Vascular US and she would like to wait till she has recovered from her surgery. Are you ok with this? Please advise

## 2018-01-25 ENCOUNTER — Other Ambulatory Visit: Payer: Self-pay | Admitting: Internal Medicine

## 2018-02-09 ENCOUNTER — Ambulatory Visit (INDEPENDENT_AMBULATORY_CARE_PROVIDER_SITE_OTHER)
Admission: RE | Admit: 2018-02-09 | Discharge: 2018-02-09 | Disposition: A | Discharge status: Home / Self Care | Payer: Medicare Other | Source: Ambulatory Visit | Attending: Internal Medicine | Admitting: Internal Medicine

## 2018-02-09 ENCOUNTER — Ambulatory Visit (INDEPENDENT_AMBULATORY_CARE_PROVIDER_SITE_OTHER): Payer: Medicare Other | Admitting: Internal Medicine

## 2018-02-09 ENCOUNTER — Encounter: Payer: Self-pay | Admitting: Internal Medicine

## 2018-02-09 ENCOUNTER — Telehealth: Payer: Self-pay | Admitting: Internal Medicine

## 2018-02-09 VITALS — BP 118/68 | HR 106 | Temp 98.5°F | Ht 67.0 in

## 2018-02-09 DIAGNOSIS — M1611 Unilateral primary osteoarthritis, right hip: Secondary | ICD-10-CM | POA: Diagnosis not present

## 2018-02-09 DIAGNOSIS — S99912A Unspecified injury of left ankle, initial encounter: Secondary | ICD-10-CM | POA: Diagnosis not present

## 2018-02-09 DIAGNOSIS — W19XXXA Unspecified fall, initial encounter: Secondary | ICD-10-CM | POA: Insufficient documentation

## 2018-02-09 DIAGNOSIS — G8929 Other chronic pain: Secondary | ICD-10-CM

## 2018-02-09 DIAGNOSIS — S82832A Other fracture of upper and lower end of left fibula, initial encounter for closed fracture: Secondary | ICD-10-CM

## 2018-02-09 DIAGNOSIS — S99911A Unspecified injury of right ankle, initial encounter: Secondary | ICD-10-CM | POA: Diagnosis not present

## 2018-02-09 DIAGNOSIS — M25572 Pain in left ankle and joints of left foot: Secondary | ICD-10-CM

## 2018-02-09 DIAGNOSIS — M545 Low back pain, unspecified: Secondary | ICD-10-CM | POA: Insufficient documentation

## 2018-02-09 DIAGNOSIS — M25571 Pain in right ankle and joints of right foot: Secondary | ICD-10-CM | POA: Diagnosis not present

## 2018-02-09 DIAGNOSIS — M7989 Other specified soft tissue disorders: Secondary | ICD-10-CM | POA: Diagnosis not present

## 2018-02-09 MED ORDER — TRAMADOL HCL 50 MG PO TABS: 50.0000 mg | ORAL_TABLET | Freq: Four times a day (QID) | ORAL | 0 refills | Status: DC | PRN

## 2018-02-09 NOTE — Telephone Encounter (Signed)
Spoke with pt, informed her Dr. Jenny Reichmann was still in clinic and has not had a chance to review results yet. I told her I will call her once I have an update.

## 2018-02-09 NOTE — Telephone Encounter (Signed)
Copied from [place] [phone] [phone]. Topic: Inquiry >> Feb 09, 2018  3:37 PM Margot Ables wrote: Reason for CRM: pt calling about xray results. She said xray tech told her to call if she hadn't heard back by 4pm. Pt wants results before the weekend.

## 2018-02-09 NOTE — Progress Notes (Signed)
Subjective:    Patient ID: Alison Sullivan, female    DOB: 19-Feb-1951, 67 y.o.   MRN: [id number]  HPI  Here to fu with c/o bilat ankle pain; 2 days ago had a sudden kind of unexplained fall as "something went out" with the right leg and fell in parking lot walking out of the store, she thinks more of an "impact injury" to the right, but more of a serious sprain on the left ankle that she used to try to "catch herself" by twisting.  Right ankle already improved, just sore and achy, but left ankle swelling and pain still severe, and ambulates in severe pain, and both wrapped for support. Tried rest, ice, elevate  Had right ankle swelling as well but now much improved, so stopped the ACE to the right today  Could not walk yesterday except to let the dog out, decided she needed to be looked at. Also has recurring right hip pain for a year that is now worse with the fall as well but not clear if related, and no clear RLE numb/weakness/radicular pain. Currently lives alone, but son coming to her today.  Though she managed to walk in, she needs wheelchair to get to xray and car after.  No other hx or new complaints Past Medical History:  Diagnosis Date  . Arthritis   . Chronic kidney disease   . Dog bite of wrist   . History of kidney stones   . Kidney infection   . Left renal mass   . Osteopenia   . Pyelonephritis   . Recurrent UTI (urinary tract infection)    Past Surgical History:  Procedure Laterality Date  . CYSTOSCOPY WITH RETROGRADE PYELOGRAM, URETEROSCOPY AND STENT PLACEMENT Left 12/28/2017   Procedure: CYSTOSCOPY WITH RETROGRADE PYELOGRAM, URETEROSCOPY AND STENT PLACEMENT, STONE BASKETRY;  Surgeon: Festus Aloe, MD;  Location: [facility];  Service: Urology;  Laterality: Left;  . FINGER SURGERY     index, growth removal  . HOLMIUM LASER APPLICATION Left [id number]/[id number]   Procedure: HOLMIUM LASER APPLICATION;  Surgeon: Festus Aloe, MD;  Location: [facility];  Service: Urology;  Laterality: Left;  . KIDNEY STONE SURGERY  2011 or 2012  . LITHOTRIPSY  2007    reports that she has been smoking cigarettes.  She has a 15.00 pack-year smoking history. She has never used smokeless tobacco. She reports that she drinks alcohol. She reports that she does not use drugs. family history includes Cancer in her father. No Known Allergies Current Outpatient Medications on File Prior to Visit  Medication Sig Dispense Refill  . Melatonin 10 MG TABS Take by mouth at bedtime.    . simvastatin (ZOCOR) 20 MG tablet TAKE 1 TABLET(20 MG) BY MOUTH AT BEDTIME 90 tablet 3   No current facility-administered medications on file prior to visit.    Review of Systems  Constitutional: Negative for other unusual diaphoresis or sweats HENT: Negative for ear discharge or swelling Eyes: Negative for other worsening visual disturbances Respiratory: Negative for stridor or other swelling  Gastrointestinal: Negative for worsening distension or other blood Genitourinary: Negative for retention or other urinary change Musculoskeletal: Negative for other MSK pain or swelling Skin: Negative for color change or other new lesions Neurological: Negative for worsening tremors and other numbness  Psychiatric/Behavioral: Negative for worsening agitation or other fatigue All other system neg per pt    Objective:   Physical Exam BP 118/68   Pulse (!) 106   Temp 98.5  F (36.9 C) (Oral)   Ht 5\' 7"  (1.702 m)   SpO2 96%   BMI 23.65 kg/m  VS noted,  Constitutional: Pt appears in NAD HENT: Head: NCAT.  Right Ear: External ear normal.  Left Ear: External ear normal.  Eyes: . Pupils are equal, round, and reactive to light. Conjunctivae and EOM are normal Nose: without d/c or deformity Neck: Neck supple. Gross normal ROM Cardiovascular: Normal rate and regular rhythm.   Pulmonary/Chest: Effort normal and breath sounds without rales or wheezing.  Right ankle with mild to mod  tender soft tissue swelling about the right lateral malleous without bruising and foot o/w neurovascular intact Left ankle with rather dramatic severe tender swelling to area just proximal to the left lateral malleolus with marked bruising to the area and distal lateral foot, has what seems to be distal leg lateral bony deformity, leg and foot o/w neurovasc intact Neurological: Pt is alert. At baseline orientation, motor grossly intact Skin: Skin is warm. No rashes, other new lesions, no LE edema Psychiatric: Pt behavior is normal without agitation  No other exam findings    Assessment & Plan:

## 2018-02-09 NOTE — Telephone Encounter (Signed)
The xrays turned out just as we expected  Right hip has some mild arthritis  Right ankle is OK  Left ankle area shows a distal fibular fracture (mild to mod)  Pt was counseled at visit to avoid wt bearing for the weekend; I would buy a crutch to use from a pharmacy or medical supply store  I will refer orthopedic - urgent

## 2018-02-09 NOTE — Telephone Encounter (Signed)
Pt has been informed and expressed understanding.  

## 2018-02-09 NOTE — Patient Instructions (Signed)
Please take all new medication as prescribed - the tramadol for pain  Please continue all other medications as before, and refills have been done if requested.  Please have the pharmacy call with any other refills you may need.  Please keep your appointments with your specialists as you may have planned  Please go to the XRAY Department in the Basement (go straight as you get off the elevator) for the x-ray testing  You will be contacted by phone if any changes need to be made immediately.  Otherwise, you will receive a letter about your results with an explanation, but please check with MyChart first.  Please remember to sign up for MyChart if you have not done so, as this will be important to you in the future with finding out test results, communicating by private email, and scheduling acute appointments online when needed.  If the films show fracture, you will need to see orthopedic soon  Otherwise, we will refer to Sports Medicine in this office regarding the right back and hip pain

## 2018-02-11 NOTE — Assessment & Plan Note (Signed)
Also for lumbar films given fall, asked her to see sports medicine or ortho as well for this

## 2018-02-11 NOTE — Assessment & Plan Note (Signed)
Etiology unclear - ? RLE giveaway due to lumbar disease

## 2018-02-11 NOTE — Assessment & Plan Note (Signed)
Very high suspicion fracture present, for film today, pain control, non wt bearing only, though she is allowed to drive home to be helped with son there, will likely need ortho attention

## 2018-02-11 NOTE — Assessment & Plan Note (Signed)
With lateral malleolar tender swelling only, for film but much less likely fracture

## 2018-02-13 ENCOUNTER — Ambulatory Visit (INDEPENDENT_AMBULATORY_CARE_PROVIDER_SITE_OTHER): Payer: Medicare Other

## 2018-02-13 ENCOUNTER — Ambulatory Visit (INDEPENDENT_AMBULATORY_CARE_PROVIDER_SITE_OTHER): Payer: Medicare Other | Admitting: Orthopaedic Surgery

## 2018-02-13 ENCOUNTER — Encounter (INDEPENDENT_AMBULATORY_CARE_PROVIDER_SITE_OTHER): Payer: Self-pay | Admitting: Orthopaedic Surgery

## 2018-02-13 DIAGNOSIS — S8265XA Nondisplaced fracture of lateral malleolus of left fibula, initial encounter for closed fracture: Secondary | ICD-10-CM

## 2018-02-13 DIAGNOSIS — M25572 Pain in left ankle and joints of left foot: Secondary | ICD-10-CM | POA: Insufficient documentation

## 2018-02-13 MED ORDER — HYDROCODONE-ACETAMINOPHEN 5-325 MG PO TABS: 1.0000 | ORAL_TABLET | Freq: Three times a day (TID) | ORAL | 0 refills | Status: DC | PRN

## 2018-02-13 NOTE — Progress Notes (Signed)
Office Visit Note   Patient: Alison Sullivan           Date of Birth: January 01, 1951           MRN: [id number] Visit Date: 02/13/2018              Requested by: Biagio Borg, MD [place], [place] [address] PCP: [name], MD   Assessment & Plan: Visit Diagnoses:  1. Closed nondisplaced fracture of lateral malleolus of left fibula, initial encounter     Plan: At this point, I believe this fracture is amenable to conservative treatment.  We will place the patient in a Cam Walker nonweightbearing for the next 5 weeks.  A prescription for a knee scooter was given as she has crutches at home.  She is really unsure that she will be able to be nonweightbearing.  I have given her prescription for Norco.  She will follow-up with [place] in 3 weeks time for repeat evaluation and x-ray.  She will call with concerns or questions in the meantime. Total face to face encounter time was greater than 45 minutes and over half of this time was spent in counseling and/or coordination of care.  Follow-Up Instructions: Return in about 3 weeks (around 03/06/2018).   Orders:  Orders Placed This Encounter  Procedures  . XR Ankle 2 Views Left   Meds ordered this encounter  Medications  . HYDROcodone-acetaminophen (NORCO) 5-325 MG tablet    Sig: Take 1 tablet by mouth 3 (three) times daily as needed for moderate pain.    Dispense:  15 tablet    Refill:  0      Procedures: No procedures performed   Clinical Data: No additional findings.   Subjective: Chief Complaint  Patient presents with  . Left Ankle - Pain    HPI patient is a pleasant 67 year old female who presents our clinic today with a left ankle fracture.  On 02/07/2018 she tripped over a curb twisting her left ankle.  She waited a few days as she felt she just had a sprain.  She was then seen by her primary care on 02/09/2018 where x-rays were obtained.  This showed Weber B left ankle fracture.  She was told to  minimize the weight she put on the left leg.  She comes in today for follow-up.  She is wearing an Ace bandage around her ankle and is weightbearing.  She has moderate pain to the lateral aspect.  Worse with weightbearing and inversion of the ankle.  She is taking over-the-counter medications for this.  Review of Systems as detailed in HPI.  All others reviewed and are negative.   Objective: Vital Signs: There were no vitals taken for this visit.  Physical Exam well-developed well-nourished female in no acute distress.  Alert and oriented x3.  Ortho Exam examination of the left ankle reveals moderate swelling.  Minimal ecchymosis.  Marked tenderness over the distal fibula.  Negative anterior drawer and talar tilt.  Specialty Comments:  No specialty comments available.  Imaging: Xr Ankle 2 Views Left  Result Date: 02/13/2018 X-rays show oblique Weber B fracture to the distal fibula.  Stress view shows no widening     PMFS History: Patient Active Problem List   Diagnosis Date Noted  . Pain in left ankle and joints of left foot 02/13/2018  . Right low back pain 02/09/2018  . Fall 02/09/2018  . Right ankle pain 02/09/2018  . Left ankle  pain 02/09/2018  . Hyperlipidemia 01/10/2018  . Routine general medical examination at a health care facility 01/02/2015  . Insomnia 01/02/2015  . Nephrolithiasis 08/11/2013   Past Medical History:  Diagnosis Date  . Arthritis   . Chronic kidney disease   . Dog bite of wrist   . History of kidney stones   . Kidney infection   . Left renal mass   . Osteopenia   . Pyelonephritis   . Recurrent UTI (urinary tract infection)     Family History  Problem Relation Age of Onset  . Cancer Father        pancreatic    Past Surgical History:  Procedure Laterality Date  . CYSTOSCOPY WITH RETROGRADE PYELOGRAM, URETEROSCOPY AND STENT PLACEMENT Left 12/28/2017   Procedure: CYSTOSCOPY WITH RETROGRADE PYELOGRAM, URETEROSCOPY AND STENT PLACEMENT, STONE  BASKETRY;  Surgeon: Festus Aloe, MD;  Location: [facility];  Service: Urology;  Laterality: Left;  . FINGER SURGERY     index, growth removal  . HOLMIUM LASER APPLICATION Left 4/50/3888   Procedure: HOLMIUM LASER APPLICATION;  Surgeon: Festus Aloe, MD;  Location: [facility];  Service: Urology;  Laterality: Left;  . KIDNEY STONE SURGERY  2011 or 2012  . LITHOTRIPSY  2007   Social History   Occupational History  . Not on file  Tobacco Use  . Smoking status: Current Every Day Smoker    Packs/day: 0.50    Years: 30.00    Pack years: 15.00    Types: Cigarettes  . Smokeless tobacco: Never Used  Substance and Sexual Activity  . Alcohol use: Yes    Comment: occ  . Drug use: No  . Sexual activity: Not on file

## 2018-03-06 ENCOUNTER — Ambulatory Visit (INDEPENDENT_AMBULATORY_CARE_PROVIDER_SITE_OTHER): Payer: Medicare Other | Admitting: Physician Assistant

## 2018-03-06 ENCOUNTER — Ambulatory Visit (INDEPENDENT_AMBULATORY_CARE_PROVIDER_SITE_OTHER): Payer: Medicare Other

## 2018-03-06 ENCOUNTER — Encounter (INDEPENDENT_AMBULATORY_CARE_PROVIDER_SITE_OTHER): Payer: Self-pay | Admitting: Physician Assistant

## 2018-03-06 DIAGNOSIS — S8265XA Nondisplaced fracture of lateral malleolus of left fibula, initial encounter for closed fracture: Secondary | ICD-10-CM | POA: Diagnosis not present

## 2018-03-06 MED ORDER — VITAMIN D (ERGOCALCIFEROL) 1.25 MG (50000 UNIT) PO CAPS: 50000.0000 [IU] | ORAL_CAPSULE | ORAL | 0 refills | Status: AC

## 2018-03-06 NOTE — Progress Notes (Signed)
Post-Op Visit Note   Patient: Alison Sullivan           Date of Birth: 07-19-1951           MRN: [id number] Visit Date: 03/06/2018 PCP: [name], MD   Assessment & Plan:  Chief Complaint:  Chief Complaint  Patient presents with  . Left Ankle - Pain, Follow-up   Visit Diagnoses:  1. Closed nondisplaced fracture of lateral malleolus of left fibula, initial encounter     Plan: patient is about 4 weeks status post nondisplaced fracture lateral malleolus of the left ankle.  She has been noncompliant with nonweightbearing in her cam walker.  She does state that she has been elevating as much as possible.  Minimal pain.  Examination of the left ankle reveals minimal swelling.  Marked tenderness over the lateral malleolus.  She is neurovascularly intact distally.  This point, reinforced to her that she really needs to be nonweightbearing for another 2 weeks.  She is to wear the cam walker at all times.  She will continue to elevate for swelling.  Follow-up with [place] in 2 weeks time for repeat evaluation and x-ray.  Hopefully at that point, we will be able to send her to outpatient physical therapy to work on range of motion.  Call with concerns or questions in the meantime.  Follow-Up Instructions: Return in about 2 weeks (around 03/20/2018).   Orders:  Orders Placed This Encounter  Procedures  . XR Ankle Complete Left   Meds ordered this encounter  Medications  . Vitamin D, Ergocalciferol, (DRISDOL) 50000 units CAPS capsule    Sig: Take 1 capsule (50,000 Units total) by mouth every 7 (seven) days for 5 doses.    Dispense:  5 capsule    Refill:  0    Imaging: Xr Ankle Complete Left  Result Date: 03/06/2018 Stable alignment of the fracture   PMFS History: Patient Active Problem List   Diagnosis Date Noted  . Closed nondisplaced fracture of lateral malleolus of left fibula 03/06/2018  . Pain in left ankle and joints of left foot 02/13/2018  . Right low back pain  02/09/2018  . Fall 02/09/2018  . Right ankle pain 02/09/2018  . Left ankle pain 02/09/2018  . Hyperlipidemia 01/10/2018  . Routine general medical examination at a health care facility 01/02/2015  . Insomnia 01/02/2015  . Nephrolithiasis 08/11/2013   Past Medical History:  Diagnosis Date  . Arthritis   . Chronic kidney disease   . Dog bite of wrist   . History of kidney stones   . Kidney infection   . Left renal mass   . Osteopenia   . Pyelonephritis   . Recurrent UTI (urinary tract infection)     Family History  Problem Relation Age of Onset  . Cancer Father        pancreatic    Past Surgical History:  Procedure Laterality Date  . CYSTOSCOPY WITH RETROGRADE PYELOGRAM, URETEROSCOPY AND STENT PLACEMENT Left 12/28/2017   Procedure: CYSTOSCOPY WITH RETROGRADE PYELOGRAM, URETEROSCOPY AND STENT PLACEMENT, STONE BASKETRY;  Surgeon: Festus Aloe, MD;  Location: [facility];  Service: Urology;  Laterality: Left;  . FINGER SURGERY     index, growth removal  . HOLMIUM LASER APPLICATION Left 6/38/9373   Procedure: HOLMIUM LASER APPLICATION;  Surgeon: Festus Aloe, MD;  Location: [facility];  Service: Urology;  Laterality: Left;  . KIDNEY STONE SURGERY  2011 or 2012  . LITHOTRIPSY  2007  Social History   Occupational History  . Not on file  Tobacco Use  . Smoking status: Current Every Day Smoker    Packs/day: 0.50    Years: 30.00    Pack years: 15.00    Types: Cigarettes  . Smokeless tobacco: Never Used  Substance and Sexual Activity  . Alcohol use: Yes    Comment: occ  . Drug use: No  . Sexual activity: Not on file

## 2018-03-12 DIAGNOSIS — N200 Calculus of kidney: Secondary | ICD-10-CM | POA: Diagnosis not present

## 2018-03-16 ENCOUNTER — Ambulatory Visit (INDEPENDENT_AMBULATORY_CARE_PROVIDER_SITE_OTHER): Payer: Medicare Other | Admitting: Internal Medicine

## 2018-03-16 ENCOUNTER — Encounter: Payer: Self-pay | Admitting: Internal Medicine

## 2018-03-16 ENCOUNTER — Other Ambulatory Visit: Payer: Medicare Other

## 2018-03-16 VITALS — BP 112/74 | HR 100 | Temp 97.9°F | Ht 67.0 in

## 2018-03-16 DIAGNOSIS — R399 Unspecified symptoms and signs involving the genitourinary system: Secondary | ICD-10-CM

## 2018-03-16 DIAGNOSIS — S8265XS Nondisplaced fracture of lateral malleolus of left fibula, sequela: Secondary | ICD-10-CM | POA: Diagnosis not present

## 2018-03-16 DIAGNOSIS — N200 Calculus of kidney: Secondary | ICD-10-CM

## 2018-03-16 LAB — POCT URINALYSIS DIPSTICK
Bilirubin, UA: NEGATIVE
Glucose, UA: NEGATIVE
Ketones, UA: NEGATIVE
Spec Grav, UA: 1.02
Urobilinogen, UA: 0.2 U/dL
pH, UA: 6

## 2018-03-16 MED ORDER — CIPROFLOXACIN HCL 500 MG PO TABS: 500.0000 mg | ORAL_TABLET | Freq: Two times a day (BID) | ORAL | 0 refills | Status: AC

## 2018-03-16 NOTE — Patient Instructions (Signed)
Please take all new medication as prescribed - the antibiotic  Please continue all other medications as before, and refills have been done if requested.  Please have the pharmacy call with any other refills you may need.  Please keep your appointments with your specialists as you may have planned   

## 2018-03-16 NOTE — Progress Notes (Signed)
Subjective:    Patient ID: Alison Sullivan, female    DOB: 1950/12/24, 67 y.o.   MRN: [id number]  HPI  Here to f/u with acute visit with long hx of recurrent UTI and pyelonephritis, c/o 2-3 days onset rapidly worsening UTI symptoms of cloudy, bad odor, and frequency.  Denies urinary symptoms such as urgency, flank pain, hematuria or n/v, though has had recent renal stones as well 2 /2019, followed by [facility].  Pt denies chest pain, increased sob or doe, wheezing, orthopnea, PND, increased LE swelling, palpitations, dizziness or syncope.   Pt denies polydipsia, polyuria. Due for f/u ortho next wk for LLE which is slow healing per pt.   No other new complaints or interval change Past Medical History:  Diagnosis Date  . Arthritis   . Chronic kidney disease   . Dog bite of wrist   . History of kidney stones   . Kidney infection   . Left renal mass   . Osteopenia   . Pyelonephritis   . Recurrent UTI (urinary tract infection)    Past Surgical History:  Procedure Laterality Date  . CYSTOSCOPY WITH RETROGRADE PYELOGRAM, URETEROSCOPY AND STENT PLACEMENT Left 12/28/2017   Procedure: CYSTOSCOPY WITH RETROGRADE PYELOGRAM, URETEROSCOPY AND STENT PLACEMENT, STONE BASKETRY;  Surgeon: Festus Aloe, MD;  Location: [facility];  Service: Urology;  Laterality: Left;  . FINGER SURGERY     index, growth removal  . HOLMIUM LASER APPLICATION Left 04/10/7823   Procedure: HOLMIUM LASER APPLICATION;  Surgeon: Festus Aloe, MD;  Location: [facility];  Service: Urology;  Laterality: Left;  . KIDNEY STONE SURGERY  2011 or 2012  . LITHOTRIPSY  2007    reports that she has been smoking cigarettes.  She has a 15.00 pack-year smoking history. She has never used smokeless tobacco. She reports that she drinks alcohol. She reports that she does not use drugs. family history includes Cancer in her father. No Known Allergies Current Outpatient Medications on File Prior  to Visit  Medication Sig Dispense Refill  . HYDROcodone-acetaminophen (NORCO) 5-325 MG tablet Take 1 tablet by mouth 3 (three) times daily as needed for moderate pain. 15 tablet 0  . Melatonin 10 MG TABS Take by mouth at bedtime.    . simvastatin (ZOCOR) 20 MG tablet TAKE 1 TABLET(20 MG) BY MOUTH AT BEDTIME 90 tablet 3  . traMADol (ULTRAM) 50 MG tablet Take 1 tablet (50 mg total) by mouth every 6 (six) hours as needed. 60 tablet 0  . Vitamin D, Ergocalciferol, (DRISDOL) 50000 units CAPS capsule Take 1 capsule (50,000 Units total) by mouth every 7 (seven) days for 5 doses. 5 capsule 0   No current facility-administered medications on file prior to visit.    Review of Systems  Constitutional: Negative for other unusual diaphoresis or sweats HENT: Negative for ear discharge or swelling Eyes: Negative for other worsening visual disturbances Respiratory: Negative for stridor or other swelling  Gastrointestinal: Negative for worsening distension or other blood Genitourinary: Negative for retention or other urinary change Musculoskeletal: Negative for other MSK pain or swelling Skin: Negative for color change or other new lesions Neurological: Negative for worsening tremors and other numbness  Psychiatric/Behavioral: Negative for worsening agitation or other fatigue All other system neg per pt    Objective:   Physical Exam BP 112/74   Pulse 100   Temp 97.9 F (36.6 C) (Oral)   Ht 5\' 7"  (1.702 m)   SpO2 96%   BMI 23.65  kg/m  VS noted, mild ill appearing Constitutional: Pt appears in NAD HENT: Head: NCAT.  Right Ear: External ear normal.  Left Ear: External ear normal.  Eyes: . Pupils are equal, round, and reactive to light. Conjunctivae and EOM are normal Nose: without d/c or deformity Neck: Neck supple. Gross normal ROM Cardiovascular: Normal rate and regular rhythm.   Pulmonary/Chest: Effort normal and breath sounds without rales or wheezing.  Abd:  Soft, ND, + BS, no  organomegaly, with mild to mod low mid abd tender, no guarding or rebound Neurological: Pt is alert. At baseline orientation, motor grossly intact Skin: Skin is warm. No rashes, other new lesions, no LE edema Psychiatric: Pt behavior is normal without agitation  No other exam findings  POCT Urinalysis Dipstick  Order: [id number]  Status:  Final result Visible to patient:  No (Not Released) Dx:  UTI symptoms   Ref Range & Units 15:30 63mo ago  Color, UA  yellow  yellow   Clarity, UA  cloudy  cloudy   Glucose, UA  neg  neg   Bilirubin, UA  neg  neg   Ketones, UA  neg  neg   Spec Grav, UA 1.010 - 1.025 1.020  1.025   Blood, UA  1+  2+   pH, UA 5.0 - 8.0 6.0  6.0   Protein, UA  1+  1+   Urobilinogen, UA 0.2 or 1.0 E.U./dL 0.2  0.2   Nitrite, UA  +  neg   Leukocytes, UA Negative Moderate (2+)Abnormal   Large (3+)Abnormal    Appearance     Odor  strong              Assessment & Plan:

## 2018-03-17 DIAGNOSIS — R399 Unspecified symptoms and signs involving the genitourinary system: Secondary | ICD-10-CM | POA: Insufficient documentation

## 2018-03-17 NOTE — Assessment & Plan Note (Signed)
Mild to mod, for urine studies, also for antibx course pending culture,  to f/u any worsening symptoms or concerns

## 2018-03-17 NOTE — Assessment & Plan Note (Signed)
Improving per pt though very slow, for ortho f/u as planned

## 2018-03-17 NOTE — Assessment & Plan Note (Addendum)
Will hold on imaging for now but low threshold for doing so if not quickly improved, to f/u urology as planned

## 2018-03-19 ENCOUNTER — Encounter: Payer: Self-pay | Admitting: Internal Medicine

## 2018-03-19 LAB — URINE CULTURE
MICRO NUMBER:: 90542814
SPECIMEN QUALITY:: ADEQUATE

## 2018-03-20 ENCOUNTER — Ambulatory Visit (INDEPENDENT_AMBULATORY_CARE_PROVIDER_SITE_OTHER): Payer: Medicare Other | Admitting: Orthopaedic Surgery

## 2018-03-20 ENCOUNTER — Encounter (INDEPENDENT_AMBULATORY_CARE_PROVIDER_SITE_OTHER): Payer: Self-pay | Admitting: Orthopaedic Surgery

## 2018-03-20 ENCOUNTER — Ambulatory Visit (INDEPENDENT_AMBULATORY_CARE_PROVIDER_SITE_OTHER): Payer: Medicare Other

## 2018-03-20 DIAGNOSIS — S8265XA Nondisplaced fracture of lateral malleolus of left fibula, initial encounter for closed fracture: Secondary | ICD-10-CM

## 2018-03-20 NOTE — Addendum Note (Signed)
Addended by: Marlyne Beards on: 03/20/2018 02:42 PM   Modules accepted: Orders

## 2018-03-20 NOTE — Progress Notes (Signed)
   Post-Op Visit Note   Patient: Alison Sullivan           Date of Birth: 06/18/1951           MRN: [id number] Visit Date: 03/20/2018 PCP: [name], MD   Assessment & Plan:  Chief Complaint:  Chief Complaint  Patient presents with  . Left Ankle - Pain   Visit Diagnoses:  1. Closed nondisplaced fracture of lateral malleolus of left fibula, initial encounter     Plan: Patient is 6 weeks status post nonoperative treatment of nondisplaced fibula fracture.  She has been Weightbearing and ambulating in a Cam walker.  Her exam is essentially unremarkable.  She has minimal swelling.  X-rays demonstrate good bony healing and callus formation.  At this point we will discontinue cam walker and transition her to an ASO brace.  Referral for physical therapy was made today.  Follow-up in 6 weeks with three-view x-rays of the left ankle.  Follow-Up Instructions: Return in about 6 weeks (around 05/01/2018).   Orders:  Orders Placed This Encounter  Procedures  . XR Ankle Complete Left   No orders of the defined types were placed in this encounter.   Imaging: Xr Ankle Complete Left  Result Date: 03/20/2018 Disuse osteopenia.  Evidence of callus formation.  Alignment of fibula fracture is anatomic.   PMFS History: Patient Active Problem List   Diagnosis Date Noted  . UTI symptoms 03/17/2018  . Closed nondisplaced fracture of lateral malleolus of left fibula 03/06/2018  . Pain in left ankle and joints of left foot 02/13/2018  . Right low back pain 02/09/2018  . Fall 02/09/2018  . Right ankle pain 02/09/2018  . Left ankle pain 02/09/2018  . Hyperlipidemia 01/10/2018  . Routine general medical examination at a health care facility 01/02/2015  . Insomnia 01/02/2015  . Nephrolithiasis 08/11/2013   Past Medical History:  Diagnosis Date  . Arthritis   . Chronic kidney disease   . Dog bite of wrist   . History of kidney stones   . Kidney infection   . Left renal mass   .  Osteopenia   . Pyelonephritis   . Recurrent UTI (urinary tract infection)     Family History  Problem Relation Age of Onset  . Cancer Father        pancreatic    Past Surgical History:  Procedure Laterality Date  . CYSTOSCOPY WITH RETROGRADE PYELOGRAM, URETEROSCOPY AND STENT PLACEMENT Left 12/28/2017   Procedure: CYSTOSCOPY WITH RETROGRADE PYELOGRAM, URETEROSCOPY AND STENT PLACEMENT, STONE BASKETRY;  Surgeon: Festus Aloe, MD;  Location: [facility];  Service: Urology;  Laterality: Left;  . FINGER SURGERY     index, growth removal  . HOLMIUM LASER APPLICATION Left 3/74/8270   Procedure: HOLMIUM LASER APPLICATION;  Surgeon: Festus Aloe, MD;  Location: [facility];  Service: Urology;  Laterality: Left;  . KIDNEY STONE SURGERY  2011 or 2012  . LITHOTRIPSY  2007   Social History   Occupational History  . Not on file  Tobacco Use  . Smoking status: Current Every Day Smoker    Packs/day: 0.50    Years: 30.00    Pack years: 15.00    Types: Cigarettes  . Smokeless tobacco: Never Used  Substance and Sexual Activity  . Alcohol use: Yes    Comment: occ  . Drug use: No  . Sexual activity: Not on file

## 2018-03-27 DIAGNOSIS — M25572 Pain in left ankle and joints of left foot: Secondary | ICD-10-CM | POA: Diagnosis not present

## 2018-03-27 DIAGNOSIS — M25672 Stiffness of left ankle, not elsewhere classified: Secondary | ICD-10-CM | POA: Diagnosis not present

## 2018-03-27 DIAGNOSIS — Z4789 Encounter for other orthopedic aftercare: Secondary | ICD-10-CM | POA: Diagnosis not present

## 2018-03-27 DIAGNOSIS — R262 Difficulty in walking, not elsewhere classified: Secondary | ICD-10-CM | POA: Diagnosis not present

## 2018-03-29 DIAGNOSIS — R262 Difficulty in walking, not elsewhere classified: Secondary | ICD-10-CM | POA: Diagnosis not present

## 2018-03-29 DIAGNOSIS — M25672 Stiffness of left ankle, not elsewhere classified: Secondary | ICD-10-CM | POA: Diagnosis not present

## 2018-03-29 DIAGNOSIS — Z4789 Encounter for other orthopedic aftercare: Secondary | ICD-10-CM | POA: Diagnosis not present

## 2018-03-29 DIAGNOSIS — M25572 Pain in left ankle and joints of left foot: Secondary | ICD-10-CM | POA: Diagnosis not present

## 2018-04-02 DIAGNOSIS — R262 Difficulty in walking, not elsewhere classified: Secondary | ICD-10-CM | POA: Diagnosis not present

## 2018-04-02 DIAGNOSIS — M25572 Pain in left ankle and joints of left foot: Secondary | ICD-10-CM | POA: Diagnosis not present

## 2018-04-02 DIAGNOSIS — Z4789 Encounter for other orthopedic aftercare: Secondary | ICD-10-CM | POA: Diagnosis not present

## 2018-04-02 DIAGNOSIS — M25672 Stiffness of left ankle, not elsewhere classified: Secondary | ICD-10-CM | POA: Diagnosis not present

## 2018-04-04 DIAGNOSIS — M25672 Stiffness of left ankle, not elsewhere classified: Secondary | ICD-10-CM | POA: Diagnosis not present

## 2018-04-04 DIAGNOSIS — R262 Difficulty in walking, not elsewhere classified: Secondary | ICD-10-CM | POA: Diagnosis not present

## 2018-04-04 DIAGNOSIS — M25572 Pain in left ankle and joints of left foot: Secondary | ICD-10-CM | POA: Diagnosis not present

## 2018-04-04 DIAGNOSIS — Z4789 Encounter for other orthopedic aftercare: Secondary | ICD-10-CM | POA: Diagnosis not present

## 2018-04-06 DIAGNOSIS — Z4789 Encounter for other orthopedic aftercare: Secondary | ICD-10-CM | POA: Diagnosis not present

## 2018-04-06 DIAGNOSIS — M25572 Pain in left ankle and joints of left foot: Secondary | ICD-10-CM | POA: Diagnosis not present

## 2018-04-06 DIAGNOSIS — R262 Difficulty in walking, not elsewhere classified: Secondary | ICD-10-CM | POA: Diagnosis not present

## 2018-04-06 DIAGNOSIS — M25672 Stiffness of left ankle, not elsewhere classified: Secondary | ICD-10-CM | POA: Diagnosis not present

## 2018-04-10 DIAGNOSIS — Z4789 Encounter for other orthopedic aftercare: Secondary | ICD-10-CM | POA: Diagnosis not present

## 2018-04-10 DIAGNOSIS — R262 Difficulty in walking, not elsewhere classified: Secondary | ICD-10-CM | POA: Diagnosis not present

## 2018-04-10 DIAGNOSIS — M25672 Stiffness of left ankle, not elsewhere classified: Secondary | ICD-10-CM | POA: Diagnosis not present

## 2018-04-10 DIAGNOSIS — M25572 Pain in left ankle and joints of left foot: Secondary | ICD-10-CM | POA: Diagnosis not present

## 2018-04-11 DIAGNOSIS — R262 Difficulty in walking, not elsewhere classified: Secondary | ICD-10-CM | POA: Diagnosis not present

## 2018-04-11 DIAGNOSIS — M25572 Pain in left ankle and joints of left foot: Secondary | ICD-10-CM | POA: Diagnosis not present

## 2018-04-11 DIAGNOSIS — Z4789 Encounter for other orthopedic aftercare: Secondary | ICD-10-CM | POA: Diagnosis not present

## 2018-04-11 DIAGNOSIS — M25672 Stiffness of left ankle, not elsewhere classified: Secondary | ICD-10-CM | POA: Diagnosis not present

## 2018-04-13 DIAGNOSIS — M25672 Stiffness of left ankle, not elsewhere classified: Secondary | ICD-10-CM | POA: Diagnosis not present

## 2018-04-13 DIAGNOSIS — Z4789 Encounter for other orthopedic aftercare: Secondary | ICD-10-CM | POA: Diagnosis not present

## 2018-04-13 DIAGNOSIS — M25572 Pain in left ankle and joints of left foot: Secondary | ICD-10-CM | POA: Diagnosis not present

## 2018-04-13 DIAGNOSIS — R262 Difficulty in walking, not elsewhere classified: Secondary | ICD-10-CM | POA: Diagnosis not present

## 2018-04-16 ENCOUNTER — Telehealth (INDEPENDENT_AMBULATORY_CARE_PROVIDER_SITE_OTHER): Payer: Self-pay | Admitting: Orthopaedic Surgery

## 2018-04-16 DIAGNOSIS — M25672 Stiffness of left ankle, not elsewhere classified: Secondary | ICD-10-CM | POA: Diagnosis not present

## 2018-04-16 DIAGNOSIS — M25572 Pain in left ankle and joints of left foot: Secondary | ICD-10-CM | POA: Diagnosis not present

## 2018-04-16 DIAGNOSIS — Z4789 Encounter for other orthopedic aftercare: Secondary | ICD-10-CM | POA: Diagnosis not present

## 2018-04-16 DIAGNOSIS — R262 Difficulty in walking, not elsewhere classified: Secondary | ICD-10-CM | POA: Diagnosis not present

## 2018-04-16 NOTE — Telephone Encounter (Signed)
Patient called asked if she is suppose to continue taking the vitamin D tabs. Patient said she finished the Rx on May 22nd. Patient said there were only 5 tabs in the Rx. Patient said [facility] is calling to refill the Rx. The number to contact patient is [phone]

## 2018-04-16 NOTE — Telephone Encounter (Signed)
Called patient to let her know

## 2018-04-16 NOTE — Telephone Encounter (Signed)
She doesn't need to take it anymore.

## 2018-04-16 NOTE — Telephone Encounter (Signed)
See message below °

## 2018-04-18 DIAGNOSIS — Z4789 Encounter for other orthopedic aftercare: Secondary | ICD-10-CM | POA: Diagnosis not present

## 2018-04-18 DIAGNOSIS — M25672 Stiffness of left ankle, not elsewhere classified: Secondary | ICD-10-CM | POA: Diagnosis not present

## 2018-04-18 DIAGNOSIS — M25572 Pain in left ankle and joints of left foot: Secondary | ICD-10-CM | POA: Diagnosis not present

## 2018-04-18 DIAGNOSIS — R262 Difficulty in walking, not elsewhere classified: Secondary | ICD-10-CM | POA: Diagnosis not present

## 2018-04-20 DIAGNOSIS — M25672 Stiffness of left ankle, not elsewhere classified: Secondary | ICD-10-CM | POA: Diagnosis not present

## 2018-04-20 DIAGNOSIS — R262 Difficulty in walking, not elsewhere classified: Secondary | ICD-10-CM | POA: Diagnosis not present

## 2018-04-20 DIAGNOSIS — Z4789 Encounter for other orthopedic aftercare: Secondary | ICD-10-CM | POA: Diagnosis not present

## 2018-04-20 DIAGNOSIS — M25572 Pain in left ankle and joints of left foot: Secondary | ICD-10-CM | POA: Diagnosis not present

## 2018-04-24 DIAGNOSIS — R262 Difficulty in walking, not elsewhere classified: Secondary | ICD-10-CM | POA: Diagnosis not present

## 2018-04-24 DIAGNOSIS — Z4789 Encounter for other orthopedic aftercare: Secondary | ICD-10-CM | POA: Diagnosis not present

## 2018-04-24 DIAGNOSIS — M25572 Pain in left ankle and joints of left foot: Secondary | ICD-10-CM | POA: Diagnosis not present

## 2018-04-24 DIAGNOSIS — M25672 Stiffness of left ankle, not elsewhere classified: Secondary | ICD-10-CM | POA: Diagnosis not present

## 2018-04-26 DIAGNOSIS — R262 Difficulty in walking, not elsewhere classified: Secondary | ICD-10-CM | POA: Diagnosis not present

## 2018-04-26 DIAGNOSIS — M25572 Pain in left ankle and joints of left foot: Secondary | ICD-10-CM | POA: Diagnosis not present

## 2018-04-26 DIAGNOSIS — Z4789 Encounter for other orthopedic aftercare: Secondary | ICD-10-CM | POA: Diagnosis not present

## 2018-04-26 DIAGNOSIS — M25672 Stiffness of left ankle, not elsewhere classified: Secondary | ICD-10-CM | POA: Diagnosis not present

## 2018-05-01 DIAGNOSIS — M25572 Pain in left ankle and joints of left foot: Secondary | ICD-10-CM | POA: Diagnosis not present

## 2018-05-01 DIAGNOSIS — Z4789 Encounter for other orthopedic aftercare: Secondary | ICD-10-CM | POA: Diagnosis not present

## 2018-05-01 DIAGNOSIS — M25672 Stiffness of left ankle, not elsewhere classified: Secondary | ICD-10-CM | POA: Diagnosis not present

## 2018-05-01 DIAGNOSIS — R262 Difficulty in walking, not elsewhere classified: Secondary | ICD-10-CM | POA: Diagnosis not present

## 2018-05-03 DIAGNOSIS — R262 Difficulty in walking, not elsewhere classified: Secondary | ICD-10-CM | POA: Diagnosis not present

## 2018-05-03 DIAGNOSIS — M25572 Pain in left ankle and joints of left foot: Secondary | ICD-10-CM | POA: Diagnosis not present

## 2018-05-03 DIAGNOSIS — M25672 Stiffness of left ankle, not elsewhere classified: Secondary | ICD-10-CM | POA: Diagnosis not present

## 2018-05-03 DIAGNOSIS — Z4789 Encounter for other orthopedic aftercare: Secondary | ICD-10-CM | POA: Diagnosis not present

## 2018-05-08 ENCOUNTER — Ambulatory Visit (INDEPENDENT_AMBULATORY_CARE_PROVIDER_SITE_OTHER): Payer: Medicare Other | Admitting: Orthopaedic Surgery

## 2018-05-08 ENCOUNTER — Ambulatory Visit (INDEPENDENT_AMBULATORY_CARE_PROVIDER_SITE_OTHER): Payer: Medicare Other

## 2018-05-08 DIAGNOSIS — S8265XA Nondisplaced fracture of lateral malleolus of left fibula, initial encounter for closed fracture: Secondary | ICD-10-CM | POA: Diagnosis not present

## 2018-05-08 DIAGNOSIS — Z4789 Encounter for other orthopedic aftercare: Secondary | ICD-10-CM | POA: Diagnosis not present

## 2018-05-08 DIAGNOSIS — M25672 Stiffness of left ankle, not elsewhere classified: Secondary | ICD-10-CM | POA: Diagnosis not present

## 2018-05-08 DIAGNOSIS — M25572 Pain in left ankle and joints of left foot: Secondary | ICD-10-CM | POA: Diagnosis not present

## 2018-05-08 DIAGNOSIS — R262 Difficulty in walking, not elsewhere classified: Secondary | ICD-10-CM | POA: Diagnosis not present

## 2018-05-08 NOTE — Progress Notes (Signed)
Post-Op Visit Note   Patient: Alison Sullivan           Date of Birth: [birth year]           MRN: [id number] Visit Date: 05/08/2018 PCP: [name], MD   Assessment & Plan:  Chief Complaint:  Chief Complaint  Patient presents with  . Left Ankle - Follow-up    02/07/18 closed nondisplaced fracture lateral malleolus   Visit Diagnoses:  1. Closed nondisplaced fracture of lateral malleolus of left fibula, initial encounter     Plan: Patient is a pleasant 67 year old female who presents to our clinic today 3 months status post nondisplaced left fibula fracture, date of injury 02/07/2018.  She has recently finished physical therapy where she has regained near full range of motion and strength.  She is still getting occasional morning stiffness but this is resolved with activity.  Overall doing well.  Examination of her left ankle reveals very minimal tenderness to the distal fibula.  Full range of motion and strength throughout.  She is neurovascularly intact distally.  At this point, she is clinically healed and we will release her to full activity.  She will follow-up with [place] as needed.  Call with concerns or questions in the meantime.  Follow-Up Instructions: Return if symptoms worsen or fail to improve.   Orders:  Orders Placed This Encounter  Procedures  . XR Ankle Complete Left   No orders of the defined types were placed in this encounter.   Imaging: Xr Ankle Complete Left  Result Date: 05/08/2018 X-rays of the left ankle show well aligned fibula fracture with great callus formation   PMFS History: Patient Active Problem List   Diagnosis Date Noted  . UTI symptoms 03/17/2018  . Closed nondisplaced fracture of lateral malleolus of left fibula 03/06/2018  . Pain in left ankle and joints of left foot 02/13/2018  . Right low back pain 02/09/2018  . Fall 02/09/2018  . Right ankle pain 02/09/2018  . Left ankle pain 02/09/2018  . Hyperlipidemia 01/10/2018  .  Routine general medical examination at a health care facility 01/02/2015  . Insomnia 01/02/2015  . Nephrolithiasis 08/11/2013   Past Medical History:  Diagnosis Date  . Arthritis   . Chronic kidney disease   . Dog bite of wrist   . History of kidney stones   . Kidney infection   . Left renal mass   . Osteopenia   . Pyelonephritis   . Recurrent UTI (urinary tract infection)     Family History  Problem Relation Age of Onset  . Cancer Father        pancreatic    Past Surgical History:  Procedure Laterality Date  . CYSTOSCOPY WITH RETROGRADE PYELOGRAM, URETEROSCOPY AND STENT PLACEMENT Left 12/28/2017   Procedure: CYSTOSCOPY WITH RETROGRADE PYELOGRAM, URETEROSCOPY AND STENT PLACEMENT, STONE BASKETRY;  Surgeon: Festus Aloe, MD;  Location: [facility];  Service: Urology;  Laterality: Left;  . FINGER SURGERY     index, growth removal  . HOLMIUM LASER APPLICATION Left 02/20/8118   Procedure: HOLMIUM LASER APPLICATION;  Surgeon: Festus Aloe, MD;  Location: [facility];  Service: Urology;  Laterality: Left;  . KIDNEY STONE SURGERY  2011 or 2012  . LITHOTRIPSY  2007   Social History   Occupational History  . Not on file  Tobacco Use  . Smoking status: Current Every Day Smoker    Packs/day: 0.50    Years: 30.00    Pack years:  15.00    Types: Cigarettes  . Smokeless tobacco: Never Used  Substance and Sexual Activity  . Alcohol use: Yes    Comment: occ  . Drug use: No  . Sexual activity: Not on file

## 2018-06-26 DIAGNOSIS — R1084 Generalized abdominal pain: Secondary | ICD-10-CM | POA: Diagnosis not present

## 2018-06-26 DIAGNOSIS — N200 Calculus of kidney: Secondary | ICD-10-CM | POA: Diagnosis not present

## 2018-07-05 ENCOUNTER — Ambulatory Visit (INDEPENDENT_AMBULATORY_CARE_PROVIDER_SITE_OTHER): Payer: Medicare Other | Admitting: Internal Medicine

## 2018-07-05 ENCOUNTER — Encounter: Payer: Self-pay | Admitting: Internal Medicine

## 2018-07-05 DIAGNOSIS — M546 Pain in thoracic spine: Secondary | ICD-10-CM

## 2018-07-05 MED ORDER — PREDNISONE 20 MG PO TABS
40.0000 mg | ORAL_TABLET | Freq: Every day | ORAL | 0 refills | Status: DC
Start: 2018-07-05 — End: 2018-08-06

## 2018-07-05 NOTE — Assessment & Plan Note (Signed)
Rx for prednisone burst and counseled about anti-inflammatories for pain. No known trigger. Kidney stones ruled out by urology.

## 2018-07-05 NOTE — Progress Notes (Signed)
   Subjective:    Patient ID: Alison Sullivan, female    DOB: June 06, 1951, 67 y.o.   MRN: [id number]  HPI The patient is a 67 YO female coming in for left mid back pain. Started with urologist as she has had a long history of urinary infections and kidney stones. She did have [place], urine culture and CT renal without findings of new stones or hydronephrosis. She did not have infection. She is still having the pain. Worse with bending sideways and direct pressure. She is not able to sleep through this due to pain. Denies fevers or chills. Denies diarrhea or constipation. Pain does not radiate. Pain is 7/10. Has taken hydrocodone left over from broken ankle and this helped her sleep but she only took it 2-3 nights. Started about 3 weeks ago and not improving. Not worsening. Denies trying anything else for pain. Not worse with walking. Feels like a deep bruise or like she was kicked but no injury or overuse.   Review of Systems  Constitutional: Positive for activity change.  HENT: Negative.   Eyes: Negative.   Respiratory: Negative for cough, chest tightness and shortness of breath.   Cardiovascular: Negative for chest pain, palpitations and leg swelling.  Gastrointestinal: Negative for abdominal distention, abdominal pain, constipation, diarrhea, nausea and vomiting.  Musculoskeletal: Positive for back pain and myalgias.  Skin: Negative.   Neurological: Negative.   Psychiatric/Behavioral: Negative.       Objective:   Physical Exam  Constitutional: She is oriented to person, place, and time. She appears well-developed and well-nourished.  HENT:  Head: Normocephalic and atraumatic.  Eyes: EOM are normal.  Neck: Normal range of motion.  Cardiovascular: Normal rate and regular rhythm.  Pulmonary/Chest: Effort normal and breath sounds normal. No respiratory distress. She has no wheezes. She has no rales.  Abdominal: Soft. Bowel sounds are normal. She exhibits no distension. There is no tenderness.  There is no rebound.  Musculoskeletal: She exhibits tenderness. She exhibits no edema.  Pain left thoracic paraspinal region, worse with twisting, not worse with deep breathing.  Neurological: She is alert and oriented to person, place, and time. Coordination normal.  Skin: Skin is warm and dry.   Vitals:   07/05/18 0756  BP: 104/80  Pulse: 82  Temp: 97.8 F (36.6 C)  TempSrc: Oral  SpO2: 96%  Weight: 152 lb (68.9 kg)  Height: 5\' 7"  (1.702 m)      Assessment & Plan:

## 2018-07-05 NOTE — Patient Instructions (Signed)
We have sent in prednisone to take 2 pills daily for 4 days.

## 2018-08-06 ENCOUNTER — Ambulatory Visit (INDEPENDENT_AMBULATORY_CARE_PROVIDER_SITE_OTHER): Payer: Medicare Other | Admitting: Internal Medicine

## 2018-08-06 ENCOUNTER — Ambulatory Visit (INDEPENDENT_AMBULATORY_CARE_PROVIDER_SITE_OTHER)
Admission: RE | Admit: 2018-08-06 | Discharge: 2018-08-06 | Disposition: A | Discharge status: Home / Self Care | Payer: Medicare Other | Source: Ambulatory Visit | Attending: Internal Medicine | Admitting: Internal Medicine

## 2018-08-06 ENCOUNTER — Encounter: Payer: Self-pay | Admitting: Internal Medicine

## 2018-08-06 VITALS — BP 122/78 | HR 82 | Temp 98.9°F | Resp 16 | Ht 67.0 in | Wt 151.8 lb

## 2018-08-06 DIAGNOSIS — M549 Dorsalgia, unspecified: Secondary | ICD-10-CM

## 2018-08-06 DIAGNOSIS — M5136 Other intervertebral disc degeneration, lumbar region: Secondary | ICD-10-CM | POA: Diagnosis not present

## 2018-08-06 DIAGNOSIS — R918 Other nonspecific abnormal finding of lung field: Secondary | ICD-10-CM | POA: Diagnosis not present

## 2018-08-06 DIAGNOSIS — R0781 Pleurodynia: Secondary | ICD-10-CM | POA: Insufficient documentation

## 2018-08-06 MED ORDER — PREDNISONE 10 MG PO TABS: ORAL_TABLET | ORAL | 0 refills | Status: DC

## 2018-08-06 NOTE — Progress Notes (Signed)
Subjective:    Patient ID: Alison Sullivan, female    DOB: 04/24/1951, 67 y.o.   MRN: [id number]  HPI The patient is here for an acute visit.  Left sided back pain:  She saw her PCP last month for this and was diagnosed with acute thoracic back pain for unknown reason.  She was prescribed prednisone 40 mg daily for 4 days  The pain resolved, but recurred after a week.  The pain has been intense for the past week.  The past few days she has not been able to sleep or lay down.  It is only on the left side of her back.  It initially started in the lower back and is now more centered in the middle back and it come around along the rib cage to the left lateral side.    She denies radiation to the hips or legs.  She denies new numbness/tingling from this pain, but does have some chronic tingling in her toes.  She denies any cough, wheeze or shortness of breath.  She does use a heating pad at night and that helps.  Medications and allergies reviewed with patient and updated if appropriate.  Patient Active Problem List   Diagnosis Date Noted  . Left-sided back pain 08/06/2018  . Rib pain on left side 08/06/2018  . Acute thoracic back pain 07/05/2018  . UTI symptoms 03/17/2018  . Closed nondisplaced fracture of lateral malleolus of left fibula 03/06/2018  . Pain in left ankle and joints of left foot 02/13/2018  . Right low back pain 02/09/2018  . Fall 02/09/2018  . Right ankle pain 02/09/2018  . Left ankle pain 02/09/2018  . Hyperlipidemia 01/10/2018  . Routine general medical examination at a health care facility 01/02/2015  . Insomnia 01/02/2015  . Nephrolithiasis 08/11/2013    Current Outpatient Medications on File Prior to Visit  Medication Sig Dispense Refill  . Melatonin 10 MG TABS Take by mouth at bedtime.    . simvastatin (ZOCOR) 20 MG tablet TAKE 1 TABLET(20 MG) BY MOUTH AT BEDTIME 90 tablet 3   No current facility-administered medications on file prior to visit.      Past Medical History:  Diagnosis Date  . Arthritis   . Chronic kidney disease   . Dog bite of wrist   . History of kidney stones   . Kidney infection   . Left renal mass   . Osteopenia   . Pyelonephritis   . Recurrent UTI (urinary tract infection)     Past Surgical History:  Procedure Laterality Date  . CYSTOSCOPY WITH RETROGRADE PYELOGRAM, URETEROSCOPY AND STENT PLACEMENT Left 12/28/2017   Procedure: CYSTOSCOPY WITH RETROGRADE PYELOGRAM, URETEROSCOPY AND STENT PLACEMENT, STONE BASKETRY;  Surgeon: Festus Aloe, MD;  Location: [facility];  Service: Urology;  Laterality: Left;  . FINGER SURGERY     index, growth removal  . HOLMIUM LASER APPLICATION Left 2/70/3500   Procedure: HOLMIUM LASER APPLICATION;  Surgeon: Festus Aloe, MD;  Location: [facility];  Service: Urology;  Laterality: Left;  . KIDNEY STONE SURGERY  2011 or 2012  . LITHOTRIPSY  2007    Social History   Socioeconomic History  . Marital status: Single    Spouse name: Not on file  . Number of children: Not on file  . Years of education: Not on file  . Highest education level: Not on file  Occupational History  . Not on file  Social Needs  . Emergency planning/management officer  strain: Not on file  . Food insecurity:    Worry: Not on file    Inability: Not on file  . Transportation needs:    Medical: Not on file    Non-medical: Not on file  Tobacco Use  . Smoking status: Current Every Day Smoker    Packs/day: 0.50    Years: 30.00    Pack years: 15.00    Types: Cigarettes  . Smokeless tobacco: Never Used  Substance and Sexual Activity  . Alcohol use: Yes    Comment: occ  . Drug use: No  . Sexual activity: Not on file  Lifestyle  . Physical activity:    Days per week: Not on file    Minutes per session: Not on file  . Stress: Not on file  Relationships  . Social connections:    Talks on phone: Not on file    Gets together: Not on file    Attends religious service: Not  on file    Active member of club or organization: Not on file    Attends meetings of clubs or organizations: Not on file    Relationship status: Not on file  Other Topics Concern  . Not on file  Social History Narrative  . Not on file    Family History  Problem Relation Age of Onset  . Cancer Father        pancreatic    Review of Systems  Constitutional: Negative for chills and fever.  Respiratory: Negative for cough, shortness of breath and wheezing.   Musculoskeletal: Positive for back pain (Left mid-lower).       Left posterior and lateral rib pain  Neurological: Negative for weakness and numbness (No new numbness/tingling-has chronic tingling in toes-unchanged).       Objective:   Vitals:   08/06/18 1335  BP: 122/78  Pulse: 82  Resp: 16  Temp: 98.9 F (37.2 C)  SpO2: 95%   BP Readings from Last 3 Encounters:  08/06/18 122/78  07/05/18 104/80  03/16/18 112/74   Wt Readings from Last 3 Encounters:  08/06/18 151 lb 12.8 oz (68.9 kg)  07/05/18 152 lb (68.9 kg)  01/08/18 151 lb (68.5 kg)   Body mass index is 23.78 kg/m.   Physical Exam  Constitutional: She appears well-developed and well-nourished. No distress.  HENT:  Head: Normocephalic and atraumatic.  Musculoskeletal:  Left posterior and lateral rib pain with palpation, no obvious deformity; no thoracic or lumbar spine tenderness, mild left SI joint tenderness and left lower back tenderness with palpation  Neurological: No sensory deficit.  Skin: Skin is warm and dry. No rash noted. She is not diaphoretic.           Assessment & Plan:    See Problem List for Assessment and Plan of chronic medical problems.

## 2018-08-06 NOTE — Assessment & Plan Note (Signed)
Pain left lower lateral and posterior ribs with palpation, no obvious deformity, lungs clear Rib x-ray, thoracic spine x-ray ?  Need for adjustment Will refer to sports medicine for further evaluation She had very good response to prednisone-we do not want to keep treating her with this, but given severity of pain and will prescribe a prednisone taper Continue heat

## 2018-08-06 NOTE — Patient Instructions (Addendum)
Have x-rays done today.  Tests ordered today. Your results will be released to [place] (or called to you) after review, usually within 72hours after test completion. If any changes need to be made, you will be notified at that same time.   Medications reviewed and updated.  Changes include :   Prednisone taper  Your prescription(s) have been submitted to your pharmacy. Please take as directed and contact our office if you believe you are having problem(s) with the medication(s).  A referral was ordered for sports medicine

## 2018-08-06 NOTE — Assessment & Plan Note (Signed)
Left lower-mid back pain Occurred 1 month ago and improved/resolved with steroids X-rays of lumbar spine, thoracic spine and ribs Given severity of pain will do a prednisone taper to give her some relief and this was effective last month Referral to sports medicine for further evaluation and treatment

## 2018-08-07 ENCOUNTER — Telehealth: Payer: Self-pay | Admitting: Internal Medicine

## 2018-08-07 DIAGNOSIS — R911 Solitary pulmonary nodule: Secondary | ICD-10-CM

## 2018-08-07 NOTE — Telephone Encounter (Signed)
Opal Sidles form [facility] called report: Report read back and verified with caller. Routing to provider.  IMPRESSION: 1. Interval development of an approximately 1.3 cm nodular opacity within the left mid lung with progressive linear heterogeneous opacities within the right upper lung. Correlation with more recent outside examinations (if available) is recommended. Otherwise, further evaluation with contrast-enhanced chest CT is recommended. 2. No definite displaced left-sided rib fractures special attention paid to the area demarcated by the radiopaque BB  Report called to [place].

## 2018-08-07 NOTE — Telephone Encounter (Signed)
Please call her regarding recently xrays   Lower back xray shows mild arthritis.  No significant findings in middle back xray or ribs.   Her chest xray shows a nodule in the left mid lung.  - this should be evaluated further by a ct scan. If she agrees I will order this.

## 2018-08-07 NOTE — Telephone Encounter (Signed)
Please advise per Dr. Crawford's absence. Thank you  

## 2018-08-08 NOTE — Telephone Encounter (Signed)
Dr. Quay Burow addressed- recommended CT;

## 2018-08-08 NOTE — Telephone Encounter (Signed)
Pt aware of results. She is ok with doing the CT scan.

## 2018-08-08 NOTE — Telephone Encounter (Signed)
Ct scan ordered.

## 2018-08-09 ENCOUNTER — Ambulatory Visit (INDEPENDENT_AMBULATORY_CARE_PROVIDER_SITE_OTHER): Payer: Medicare Other | Admitting: Family Medicine

## 2018-08-09 ENCOUNTER — Encounter: Payer: Self-pay | Admitting: Family Medicine

## 2018-08-09 VITALS — BP 126/77 | HR 66 | Ht 67.0 in | Wt 151.0 lb

## 2018-08-09 DIAGNOSIS — M546 Pain in thoracic spine: Secondary | ICD-10-CM

## 2018-08-09 DIAGNOSIS — Z23 Encounter for immunization: Secondary | ICD-10-CM | POA: Diagnosis not present

## 2018-08-09 NOTE — Patient Instructions (Signed)
Nice to meet you  Please finish the steroids  Please try heat on the area  Please rub aspercreme with lidocaine on the area  Physical therapy will call you to set up an appointment.

## 2018-08-09 NOTE — Assessment & Plan Note (Signed)
Possible that she does have some pain associated with facet disease.  Has a history of kidney stones but was seen by her urologist and found to have no findings on imaging. -Continue the prednisone. -We will refer to physical therapy to help with any strengthening and imbalance. -Could consider trigger point injections or further imaging to consider facet injections.

## 2018-08-09 NOTE — Progress Notes (Signed)
Alison Sullivan - 67 y.o. female MRN [id number]  Date of birth: 04-02-1951  SUBJECTIVE:  Including CC & ROS.  Chief Complaint  Patient presents with  . Back Pain    Alison Sullivan is a 67 y.o. female that is presenting with back pain.  She was seen on 08/05/18 for same symptoms, completed a course of prednisone with no improvement. Located on her left lateral side. Pain is worse laying down or putting pressure on her back. Denies radiation to the hips or legs.  She denies new numbness or tingling from this pain. Denies prior surgeries or trauma. Currently taking Prednisone 10mg .She has been applying heat daily.   Independent review of the lumbar x-ray from 9/22 shows facet hypertrophy of the L4-L5.  Does show loss of disc height between L5-S1. Review of the ribs from 9/23 does not show any fracture. Independent review of the thoracic spine x-ray from 9/23 shows mild scoliotic curvature.   Review of Systems  Constitutional: Negative for fever.  HENT: Negative for congestion.   Respiratory: Negative for cough.   Cardiovascular: Negative for chest pain.  Gastrointestinal: Negative for abdominal pain.  Musculoskeletal: Positive for back pain.  Skin: Negative for color change.  Hematological: Negative for adenopathy.  Psychiatric/Behavioral: Negative for agitation.    HISTORY: Past Medical, Surgical, Social, and Family History Reviewed & Updated per EMR.   Pertinent Historical Findings include:  Past Medical History:  Diagnosis Date  . Arthritis   . Chronic kidney disease   . Dog bite of wrist   . History of kidney stones   . Kidney infection   . Left renal mass   . Osteopenia   . Pyelonephritis   . Recurrent UTI (urinary tract infection)     Past Surgical History:  Procedure Laterality Date  . CYSTOSCOPY WITH RETROGRADE PYELOGRAM, URETEROSCOPY AND STENT PLACEMENT Left 12/28/2017   Procedure: CYSTOSCOPY WITH RETROGRADE PYELOGRAM, URETEROSCOPY AND STENT PLACEMENT, STONE  BASKETRY;  Surgeon: Festus Aloe, MD;  Location: [facility];  Service: Urology;  Laterality: Left;  . FINGER SURGERY     index, growth removal  . HOLMIUM LASER APPLICATION Left 2/42/3536   Procedure: HOLMIUM LASER APPLICATION;  Surgeon: Festus Aloe, MD;  Location: [facility];  Service: Urology;  Laterality: Left;  . KIDNEY STONE SURGERY  2011 or 2012  . LITHOTRIPSY  2007    No Known Allergies  Family History  Problem Relation Age of Onset  . Cancer Father        pancreatic     Social History   Socioeconomic History  . Marital status: Single    Spouse name: Not on file  . Number of children: Not on file  . Years of education: Not on file  . Highest education level: Not on file  Occupational History  . Not on file  Social Needs  . Financial resource strain: Not on file  . Food insecurity:    Worry: Not on file    Inability: Not on file  . Transportation needs:    Medical: Not on file    Non-medical: Not on file  Tobacco Use  . Smoking status: Current Every Day Smoker    Packs/day: 0.50    Years: 30.00    Pack years: 15.00    Types: Cigarettes  . Smokeless tobacco: Never Used  Substance and Sexual Activity  . Alcohol use: Yes    Comment: occ  . Drug use: No  . Sexual activity: Not on  file  Lifestyle  . Physical activity:    Days per week: Not on file    Minutes per session: Not on file  . Stress: Not on file  Relationships  . Social connections:    Talks on phone: Not on file    Gets together: Not on file    Attends religious service: Not on file    Active member of club or organization: Not on file    Attends meetings of clubs or organizations: Not on file    Relationship status: Not on file  . Intimate partner violence:    Fear of current or ex partner: Not on file    Emotionally abused: Not on file    Physically abused: Not on file    Forced sexual activity: Not on file  Other Topics Concern  . Not on file    Social History Narrative  . Not on file     PHYSICAL EXAM:  VS: BP 126/77   Pulse 66   Ht 5\' 7"  (1.702 m)   Wt 151 lb (68.5 kg)   SpO2 98%   BMI 23.65 kg/m  Physical Exam Gen: NAD, alert, cooperative with exam, well-appearing ENT: normal lips, normal nasal mucosa,  Eye: normal EOM, normal conjunctiva and lids CV:  no edema, +2 pedal pulses   Resp: no accessory muscle use, non-labored,  Skin: no rashes, no areas of induration  Neuro: normal tone, normal sensation to touch Psych:  normal insight, alert and oriented MSK:  Back: No tenderness to palpation of the midline lumbar spine or thoracic spine. Some tenderness palpation over the lumbar left-sided paraspinal muscles. Normal internal and external rotation of the hips. Normal strength resistance with hip flexion, knee flexion extension. Negative straight leg raise bilaterally. Has an imbalance with single leg standing. Neurovascular intact     ASSESSMENT & PLAN:   Acute thoracic back pain Possible that she does have some pain associated with facet disease.  Has a history of kidney stones but was seen by her urologist and found to have no findings on imaging. -Continue the prednisone. -We will refer to physical therapy to help with any strengthening and imbalance. -Could consider trigger point injections or further imaging to consider facet injections.

## 2018-08-10 ENCOUNTER — Other Ambulatory Visit: Payer: Self-pay | Admitting: Internal Medicine

## 2018-08-10 DIAGNOSIS — E785 Hyperlipidemia, unspecified: Secondary | ICD-10-CM

## 2018-08-13 ENCOUNTER — Other Ambulatory Visit (INDEPENDENT_AMBULATORY_CARE_PROVIDER_SITE_OTHER): Payer: Medicare Other

## 2018-08-13 DIAGNOSIS — E785 Hyperlipidemia, unspecified: Secondary | ICD-10-CM | POA: Diagnosis not present

## 2018-08-13 LAB — BASIC METABOLIC PANEL
BUN: 20 mg/dL (ref 6–23)
CALCIUM: 9.3 mg/dL (ref 8.4–10.5)
CO2: 27 mEq/L (ref 19–32)
Chloride: 104 mEq/L (ref 96–112)
Creatinine, Ser: 0.79 mg/dL (ref 0.40–1.20)
GFR: 77.08 mL/min (ref >60.00)
GLUCOSE: 111 mg/dL — AB (ref 70–99)
Potassium: 3.7 mEq/L (ref 3.5–5.1)
SODIUM: 140 meq/L (ref 135–145)

## 2018-08-21 ENCOUNTER — Ambulatory Visit (INDEPENDENT_AMBULATORY_CARE_PROVIDER_SITE_OTHER)
Admission: RE | Admit: 2018-08-21 | Discharge: 2018-08-21 | Disposition: A | Discharge status: Home / Self Care | Payer: Medicare Other | Source: Ambulatory Visit | Attending: Internal Medicine | Admitting: Internal Medicine

## 2018-08-21 DIAGNOSIS — J439 Emphysema, unspecified: Secondary | ICD-10-CM | POA: Diagnosis not present

## 2018-08-21 DIAGNOSIS — J189 Pneumonia, unspecified organism: Secondary | ICD-10-CM | POA: Diagnosis not present

## 2018-08-21 DIAGNOSIS — R911 Solitary pulmonary nodule: Secondary | ICD-10-CM

## 2018-08-21 MED ORDER — IOPAMIDOL (ISOVUE-300) INJECTION 61%
80.0000 mL | Freq: Once | INTRAVENOUS | Status: AC | PRN
  Administered 2018-08-21: 80 mL via INTRAVENOUS

## 2018-08-22 ENCOUNTER — Telehealth: Payer: Self-pay | Admitting: *Deleted

## 2018-08-22 DIAGNOSIS — R918 Other nonspecific abnormal finding of lung field: Secondary | ICD-10-CM

## 2018-08-22 NOTE — Telephone Encounter (Signed)
'  Alison Sullivan' from [facility] calling with results of Chest CT:  IMPRESSION: 1. Spiculated left upper lobe nodule with ipsilateral mediastinal adenopathy, findings most consistent with primary bronchogenic carcinoma. 2. Patchy ground-glass, consolidation, bronchiectasis and volume loss in the right upper lobe, and to a lesser extent right lower lobe, are likely due to pneumonia. Follow-up CT chest without contrast in 3-4 weeks is recommended in further evaluation, as a synchronous primary bronchogenic carcinoma cannot be excluded. 3. These results will be called to the ordering clinician or representative by the Radiologist Assistant, and communication documented in the PACS or zVision Dashboard. 4. Small left adrenal nodule, indeterminate. 5.  Aortic atherosclerosis (ICD10-170.0). 6.  Emphysema (ICD10-J43.9).  NT alerted practice; spoke with '[name].'

## 2018-08-22 NOTE — Telephone Encounter (Signed)
Pt aware of results and expressed understanding.  

## 2018-08-22 NOTE — Telephone Encounter (Signed)
Please call Alison Sullivan regarding her Ct scan.  She does have a nodule in her lung that is concerning for lung cancer.  I have referred her to a lung doctor upstairs to help evaluate this further - they should call her in the next couple of days to schedule an appointment.  She may need a biopsy.  They will review her ct scan and further evaluation and treatment with her when they see her.

## 2018-08-22 NOTE — Addendum Note (Signed)
Addended by: Binnie Rail on: 08/22/2018 12:44 PM   Modules accepted: Orders

## 2018-08-28 DIAGNOSIS — M546 Pain in thoracic spine: Secondary | ICD-10-CM | POA: Diagnosis not present

## 2018-09-05 DIAGNOSIS — M546 Pain in thoracic spine: Secondary | ICD-10-CM | POA: Diagnosis not present

## 2018-09-11 ENCOUNTER — Encounter: Payer: Self-pay | Admitting: Internal Medicine

## 2018-09-11 ENCOUNTER — Ambulatory Visit (INDEPENDENT_AMBULATORY_CARE_PROVIDER_SITE_OTHER): Payer: Medicare Other | Admitting: Internal Medicine

## 2018-09-11 ENCOUNTER — Other Ambulatory Visit (INDEPENDENT_AMBULATORY_CARE_PROVIDER_SITE_OTHER): Payer: Medicare Other

## 2018-09-11 VITALS — BP 118/76 | HR 110 | Ht 67.0 in | Wt 152.0 lb

## 2018-09-11 DIAGNOSIS — R911 Solitary pulmonary nodule: Secondary | ICD-10-CM | POA: Insufficient documentation

## 2018-09-11 DIAGNOSIS — J479 Bronchiectasis, uncomplicated: Secondary | ICD-10-CM

## 2018-09-11 DIAGNOSIS — J449 Chronic obstructive pulmonary disease, unspecified: Secondary | ICD-10-CM | POA: Diagnosis not present

## 2018-09-11 DIAGNOSIS — F17210 Nicotine dependence, cigarettes, uncomplicated: Secondary | ICD-10-CM | POA: Diagnosis not present

## 2018-09-11 LAB — CBC WITH DIFFERENTIAL/PLATELET
Basophils Absolute: 0.1 10*3/uL (ref 0.0–0.1)
Basophils Relative: 0.9 % (ref 0.0–3.0)
EOS PCT: 2.2 % (ref 0.0–5.0)
Eosinophils Absolute: 0.2 10*3/uL (ref 0.0–0.7)
HCT: 49.6 % — ABNORMAL HIGH (ref 36.0–46.0)
HEMOGLOBIN: 16.9 g/dL — AB (ref 12.0–15.0)
LYMPHS PCT: 25.6 % (ref 12.0–46.0)
Lymphs Abs: 2.5 10*3/uL (ref 0.7–4.0)
MCHC: 34.1 g/dL (ref 30.0–36.0)
MCV: 91.2 fl (ref 78.0–100.0)
MONOS PCT: 10.2 % (ref 3.0–12.0)
Monocytes Absolute: 1 10*3/uL (ref 0.1–1.0)
Neutro Abs: 6.1 10*3/uL (ref 1.4–7.7)
Neutrophils Relative %: 61.1 % (ref 43.0–77.0)
Platelets: 383 10*3/uL (ref 150.0–400.0)
RBC: 5.44 Mil/uL — AB (ref 3.87–5.11)
RDW: 13.4 % (ref 11.5–15.5)
WBC: 9.9 10*3/uL (ref 4.0–10.5)

## 2018-09-11 LAB — SEDIMENTATION RATE: SED RATE: 35 mm/h — AB (ref 0–30)

## 2018-09-11 NOTE — Progress Notes (Signed)
Alison Sullivan, female    DOB: 1950/12/17,    MRN: [id number]   Brief patient profile:  35 yowf active smoker with abrupt onset  of L lower/ back/ above L pelvis onset mid July 2019 "felt like a kidney stone"   > migrated up to the L post rib cage so had L rib cxr and found to have LUL nodule on plain film > CT 08/22/18>  1. Spiculated left upper lobe nodule with ipsilateral mediastinal adenopathy, findings most consistent with primary bronchogenic carcinoma. 2. Patchy ground-glass, consolidation, bronchiectasis and volume loss in the right upper lobe, and to a lesser extent right lower lobe, are likely due to pneumonia.      09/11/2018  Pulmonary / 1st office eval  Chief Complaint  Patient presents with  . Pulnmonary Consult    Referred by Dr. Billey Gosling for incidental pulmonary nodule. Ct done to eval back pain that started in July 2019.    Dyspnea:  Lots of hiking/ hills Cough: none nor fever, ns/ wt loss and no longer having any chest or rib pain  Sleep: on side typically on R/ horizontal SABA use:  Prednisone helped pain temporarily in the L lower back   Has h/o hiking in [place] 2009/ [place] and returned 2013 hiking again but never felt sick while there     No   obvious day to day or daytime variability or assoc excess/ purulent sputum or mucus plugs or hemoptysis or   chest tightness, subjective wheeze or overt sinus or hb symptoms.   Sleeping as above  without nocturnal  or early am exacerbation  of respiratory  c/o's or need for noct saba. Also denies any obvious fluctuation of symptoms with weather or environmental changes or other aggravating or alleviating factors except as outlined above   No unusual exposure hx or h/o childhood pna/ asthma or knowledge of premature birth.  Current Allergies, Complete Past Medical History, Past Surgical History, Family History, and Social History were reviewed in [employer] record.  ROS  The following are not  active complaints unless bolded Hoarseness, sore throat, dysphagia, dental problems, itching, sneezing,  nasal congestion or discharge of excess mucus or purulent secretions, ear ache,   fever, chills, sweats, unintended wt loss or wt gain, classically pleuritic or exertional cp,  orthopnea pnd or arm/hand swelling  or leg swelling, presyncope, palpitations, abdominal pain, anorexia, nausea, vomiting, diarrhea  or change in bowel habits or change in bladder habits, change in stools or change in urine, dysuria, hematuria,  rash, arthralgias/positional Low back pain on L persist, better p naprosyn, visual complaints, headache, numbness, weakness or ataxia or problems with walking or coordination,  change in mood or  memory.             Past Medical History:  Diagnosis Date  . Arthritis   . Chronic kidney disease   . Dog bite of wrist   . History of kidney stones   . Kidney infection   . Left renal mass   . Osteopenia   . Pyelonephritis   . Recurrent UTI (urinary tract infection)     Outpatient Medications Prior to Visit  Medication Sig Dispense Refill  . Melatonin 10 MG TABS Take by mouth at bedtime.    . simvastatin (ZOCOR) 20 MG tablet TAKE 1 TABLET(20 MG) BY MOUTH AT BEDTIME 90 tablet 3  . predniSONE (DELTASONE) 10 MG tablet Take 4 tabs po qd x 3 days, then 3 tabs po  qd x 3 days, then 2 tabs po qd x 3 days, then 1 tab po qd x 3 days 30 tablet 0              Objective:     BP 118/76 (BP Location: Left Arm, Cuff Size: Normal)   Pulse (!) 110   Ht '5\' 7"'  (1.702 m)   Wt 152 lb (68.9 kg)   SpO2 94%   BMI 23.81 kg/m   SpO2: 94 %  RA   HEENT: nl dentition, turbinates bilaterally, and oropharynx. Nl external ear canals without cough reflex   NECK :  without JVD/Nodes/TM/ nl carotid upstrokes bilaterally   LUNGS: no acc muscle use,  Nl contour chest which is clear to A and P bilaterally without cough on insp or exp maneuvers   CV:  RRR  no s3 or murmur or increase in  P2, and no edema   ABD:  soft and nontender with nl inspiratory excursion in the supine position. No bruits or organomegaly appreciated, bowel sounds nl  MS:  Nl gait/ ext warm without deformities, calf tenderness, cyanosis or clubbing No obvious joint restrictions   SKIN: warm and dry without lesions    NEURO:  alert, approp, nl sensorium with  no motor or cerebellar deficits apparent.       Assessment   Pulmonary nodule CT 08/22/18 1. Spiculated left upper lobe nodule with ipsilateral mediastinal adenopathy, findings most consistent with primary bronchogenic Carcinoma. - Cocci titers sent 09/11/2018 as exp to [place]/ [place] hiking x 2 last in 2014    Given smoking hx, despite tantalizing hx of cocci exposure and bronchiectatic changes on R (see sep a/p) this is bronchogenic ca until proven o/w and I fear the pain in the upper pelvis, lower back may be met dz in absence of alternative explanation.  >>>> rec w/u with PET then either bx the most accessible lesion or proceed to excisional bx if resectable (AP window nodes are not usually accessed by ebus and are quite smalle)   Discussed in detail all the  indications, usual  risks and alternatives  relative to the benefits with patient who agrees to proceed with w/u as outlined.       Bronchiectasis without complication ([name]) Ct 25/3/66 Patchy ground-glass, consolidation, bronchiectasis and volume loss in the right upper lobe, and to a lesser extent right lower Lobe .    >>> Most likely this is low grade MAI but will send serology for cocci since the has been exposed in remote past.   The former  is an extremely common benign condition in the elderly and does not warrant aggressive eval/ rx at this point unless there is a clinical correlation suggesting unaddressed pulmonary infection (purulent sputum, night sweats, unintended wt loss, doe) or evolution of  obvious changes on plain cxr (as opposed to serial CT, which is way over  sensitive to make clinical decisions re intervention and treatment in the elderly, who tend to tolerate both dx and treatment poorly) .    If she does undergo fob then the airway can be sampled at that time but no need to eval for now and certainly has no symptoms related to it.    COPD GOLD ? II  Still smoking Spirometry 09/11/2018  FEV1 1.6 (61%)  Ratio 63 with poor f/v loop s prior rx    >>> No symptoms at present, will need to stop smoking if at all prior to any surgery but no need for  prior rx and could undergo LULobectomy if needed     Cigarette smoker 4-5 min discussion re active cigarette smoking in addition to office E&M  Ask about tobacco use:   ongoing Advise quitting    I reviewed the Fletcher curve with the patient that basically indicates  if you quit smoking when your best day FEV1 is still well preserved (as is clearly  the case here)  it is highly unlikely you will progress to severe disease and informed the patient there was  no medication on the market that has proven to alter the curve/ its downward trajectory  or the likelihood of progression of their disease(unlike other chronic medical conditions such as atheroclerosis where we do think we can change the natural hx with risk reducing meds)    Therefore stopping smoking and maintaining abstinence are  the most important aspects of her care, not choice of inhalers or for that matter, doctors.  >>>Treatment other than smoking cessation  is entirely directed by severity of symptoms and focused also on reducing exacerbations, not attempting to change the natural history of the disease. Since she has no symptoms or tendency to aecod no rx needed   Assess willingness:  Not committed at this point Assist in quit attempt:  Per PCP when ready Arrange follow up:   Follow up per Primary Care planned       Total time devoted to counseling  > 50 % of initial 60 min office visit:  review case with pt/ discussion of  options/alternatives/ personally creating written customized instructions  in presence of pt  then going over those specific  Instructions directly with the pt including how to use all of the meds but in particular covering each new medication in detail and the difference between the maintenance= "automatic" meds and the prns using an action plan format for the latter (If this problem/symptom => do that organization reading Left to right).  Please see AVS from this visit for a full list of these instructions which I personally wrote for this pt and  are unique to this visit.          Christinia Gully, MD 09/11/2018

## 2018-09-11 NOTE — Patient Instructions (Addendum)
Stop smoking if at all possible    Please see patient coordinator before you leave today  to schedule PET scan and I will call results   Please remember to go to the lab department downstairs in the basement  for your tests - we will call you with the results when they are available.

## 2018-09-12 ENCOUNTER — Encounter: Payer: Self-pay | Admitting: Internal Medicine

## 2018-09-12 DIAGNOSIS — J479 Bronchiectasis, uncomplicated: Secondary | ICD-10-CM | POA: Insufficient documentation

## 2018-09-12 DIAGNOSIS — F17210 Nicotine dependence, cigarettes, uncomplicated: Secondary | ICD-10-CM | POA: Insufficient documentation

## 2018-09-12 DIAGNOSIS — J449 Chronic obstructive pulmonary disease, unspecified: Secondary | ICD-10-CM | POA: Insufficient documentation

## 2018-09-12 DIAGNOSIS — M546 Pain in thoracic spine: Secondary | ICD-10-CM | POA: Diagnosis not present

## 2018-09-12 NOTE — Assessment & Plan Note (Addendum)
4-5 min discussion re active cigarette smoking in addition to office E&M  Ask about tobacco use:   ongoing Advise quitting    I reviewed the Fletcher curve with the patient that basically indicates  if you quit smoking when your best day FEV1 is still well preserved (as is clearly  the case here)  it is highly unlikely you will progress to severe disease and informed the patient there was  no medication on the market that has proven to alter the curve/ its downward trajectory  or the likelihood of progression of their disease(unlike other chronic medical conditions such as atheroclerosis where we do think we can change the natural hx with risk reducing meds)    Therefore stopping smoking and maintaining abstinence are  the most important aspects of her care, not choice of inhalers or for that matter, doctors.  >>>Treatment other than smoking cessation  is entirely directed by severity of symptoms and focused also on reducing exacerbations, not attempting to change the natural history of the disease. Since she has no symptoms or tendency to aecod no rx needed   Assess willingness:  Not committed at this point Assist in quit attempt:  Per PCP when ready Arrange follow up:   Follow up per Primary Care planned       Total time devoted to counseling  > 50 % of initial 60 min office visit:  review case with pt/ discussion of options/alternatives/ personally creating written customized instructions  in presence of pt  then going over those specific  Instructions directly with the pt including how to use all of the meds but in particular covering each new medication in detail and the difference between the maintenance= "automatic" meds and the prns using an action plan format for the latter (If this problem/symptom => do that organization reading Left to right).  Please see AVS from this visit for a full list of these instructions which I personally wrote for this pt and  are unique to this visit.

## 2018-09-12 NOTE — Assessment & Plan Note (Addendum)
Ct 08/22/18 Patchy ground-glass, consolidation, bronchiectasis and volume loss in the right upper lobe, and to a lesser extent right lower Lobe .    >>> Most likely this is low grade MAI but will send serology for cocci since the has been exposed in remote past.   The former  is an extremely common benign condition in the elderly and does not warrant aggressive eval/ rx at this point unless there is a clinical correlation suggesting unaddressed pulmonary infection (purulent sputum, night sweats, unintended wt loss, doe) or evolution of  obvious changes on plain cxr (as opposed to serial CT, which is way over sensitive to make clinical decisions re intervention and treatment in the elderly, who tend to tolerate both dx and treatment poorly) .    If she does undergo fob then the airway can be sampled at that time but no need to eval for now and certainly has no symptoms related to it.

## 2018-09-12 NOTE — Assessment & Plan Note (Addendum)
Spirometry 09/11/2018  FEV1 1.6 (61%)  Ratio 63 with poor f/v loop s prior rx    >>> No symptoms at present, will need to stop smoking if at all prior to any surgery but no need for prior rx and could undergo LULobectomy if needed

## 2018-09-12 NOTE — Assessment & Plan Note (Signed)
CT 08/22/18 1. Spiculated left upper lobe nodule with ipsilateral mediastinal adenopathy, findings most consistent with primary bronchogenic Carcinoma. - Cocci titers sent 09/11/2018 as exp to [place]/ [place] x 2 last in 2014    Given smoking hx, despite tantalizing hx of cocci exposure and bronchiectatic changes on R (see sep a/p) this is bronchogenic ca until proven o/w and I fear the pain in the upper pelvis, lower back may be met dz in absence of alternative explanation.  >>>> rec w/u with PET then either bx the most accessible lesion or proceed to excisional bx if resectable (AP window nodes are not usually accessed by ebus and are quite smalle)   Discussed in detail all the  indications, usual  risks and alternatives  relative to the benefits with patient who agrees to proceed with w/u as outlined.

## 2018-09-14 DIAGNOSIS — N200 Calculus of kidney: Secondary | ICD-10-CM | POA: Diagnosis not present

## 2018-09-15 LAB — COCCIDIOIDES ANTIBODIES

## 2018-09-18 ENCOUNTER — Encounter (HOSPITAL_COMMUNITY)
Admission: RE | Admit: 2018-09-18 | Discharge: 2018-09-18 | Disposition: A | Discharge status: Home / Self Care | Payer: Medicare Other | Source: Ambulatory Visit | Attending: Internal Medicine | Admitting: Internal Medicine

## 2018-09-18 DIAGNOSIS — R911 Solitary pulmonary nodule: Secondary | ICD-10-CM | POA: Diagnosis not present

## 2018-09-18 LAB — GLUCOSE, CAPILLARY: GLUCOSE-CAPILLARY: 113 mg/dL — AB (ref 70–99)

## 2018-09-18 MED ORDER — FLUDEOXYGLUCOSE F - 18 (FDG) INJECTION
7.5300 | Freq: Once | INTRAVENOUS | Status: AC | PRN
  Administered 2018-09-18: 7.53 via INTRAVENOUS

## 2018-09-20 ENCOUNTER — Other Ambulatory Visit: Payer: Self-pay | Admitting: Internal Medicine

## 2018-09-20 DIAGNOSIS — R911 Solitary pulmonary nodule: Secondary | ICD-10-CM

## 2018-09-20 NOTE — Progress Notes (Signed)
Ct bx ord

## 2018-09-24 ENCOUNTER — Other Ambulatory Visit: Payer: Self-pay | Admitting: Radiology

## 2018-09-25 ENCOUNTER — Encounter (HOSPITAL_COMMUNITY): Payer: Self-pay

## 2018-09-25 ENCOUNTER — Ambulatory Visit (HOSPITAL_COMMUNITY)
Admission: RE | Admit: 2018-09-25 | Discharge: 2018-09-25 | Disposition: A | Discharge status: Home / Self Care | Payer: Medicare Other | Source: Ambulatory Visit | Attending: Internal Medicine | Admitting: Internal Medicine

## 2018-09-25 ENCOUNTER — Telehealth: Payer: Self-pay | Admitting: Internal Medicine

## 2018-09-25 DIAGNOSIS — Z79899 Other long term (current) drug therapy: Secondary | ICD-10-CM | POA: Diagnosis not present

## 2018-09-25 DIAGNOSIS — Z87442 Personal history of urinary calculi: Secondary | ICD-10-CM | POA: Diagnosis not present

## 2018-09-25 DIAGNOSIS — C3490 Malignant neoplasm of unspecified part of unspecified bronchus or lung: Secondary | ICD-10-CM | POA: Diagnosis not present

## 2018-09-25 DIAGNOSIS — I700 Atherosclerosis of aorta: Secondary | ICD-10-CM | POA: Insufficient documentation

## 2018-09-25 DIAGNOSIS — J479 Bronchiectasis, uncomplicated: Secondary | ICD-10-CM | POA: Insufficient documentation

## 2018-09-25 DIAGNOSIS — N189 Chronic kidney disease, unspecified: Secondary | ICD-10-CM | POA: Diagnosis not present

## 2018-09-25 DIAGNOSIS — R911 Solitary pulmonary nodule: Secondary | ICD-10-CM

## 2018-09-25 DIAGNOSIS — F17210 Nicotine dependence, cigarettes, uncomplicated: Secondary | ICD-10-CM | POA: Insufficient documentation

## 2018-09-25 DIAGNOSIS — M8580 Other specified disorders of bone density and structure, unspecified site: Secondary | ICD-10-CM | POA: Diagnosis not present

## 2018-09-25 DIAGNOSIS — C7951 Secondary malignant neoplasm of bone: Secondary | ICD-10-CM | POA: Insufficient documentation

## 2018-09-25 DIAGNOSIS — R222 Localized swelling, mass and lump, trunk: Secondary | ICD-10-CM | POA: Diagnosis not present

## 2018-09-25 DIAGNOSIS — C7A1 Malignant poorly differentiated neuroendocrine tumors: Secondary | ICD-10-CM | POA: Diagnosis not present

## 2018-09-25 DIAGNOSIS — M1990 Unspecified osteoarthritis, unspecified site: Secondary | ICD-10-CM | POA: Insufficient documentation

## 2018-09-25 DIAGNOSIS — C7B8 Other secondary neuroendocrine tumors: Secondary | ICD-10-CM | POA: Diagnosis not present

## 2018-09-25 LAB — CBC WITH DIFFERENTIAL/PLATELET
ABS IMMATURE GRANULOCYTES: 0.04 10*3/uL (ref 0.00–0.07)
BASOS ABS: 0.1 10*3/uL (ref 0.0–0.1)
BASOS PCT: 1 %
EOS ABS: 0.4 10*3/uL (ref 0.0–0.5)
Eosinophils Relative: 5 %
HEMATOCRIT: 49.8 % — AB (ref 36.0–46.0)
Hemoglobin: 16.3 g/dL — ABNORMAL HIGH (ref 12.0–15.0)
IMMATURE GRANULOCYTES: 0 %
LYMPHS ABS: 2.3 10*3/uL (ref 0.7–4.0)
LYMPHS PCT: 24 %
MCH: 30.8 pg (ref 26.0–34.0)
MCHC: 32.7 g/dL (ref 30.0–36.0)
MCV: 94.1 fL (ref 80.0–100.0)
MONOS PCT: 10 %
Monocytes Absolute: 0.9 10*3/uL (ref 0.1–1.0)
Neutro Abs: 5.9 10*3/uL (ref 1.7–7.7)
Neutrophils Relative %: 60 %
PLATELETS: 342 10*3/uL (ref 150–400)
RBC: 5.29 MIL/uL — ABNORMAL HIGH (ref 3.87–5.11)
RDW: 13.1 % (ref 11.5–15.5)
WBC: 9.7 10*3/uL (ref 4.0–10.5)
nRBC: 0 % (ref 0.0–0.2)

## 2018-09-25 LAB — PROTIME-INR
INR: 0.9
Prothrombin Time: 12.1 seconds (ref 11.4–15.2)

## 2018-09-25 LAB — BASIC METABOLIC PANEL
Anion gap: 10 (ref 5–15)
BUN: 17 mg/dL (ref 8–23)
CHLORIDE: 109 mmol/L (ref 98–111)
CO2: 23 mmol/L (ref 22–32)
Calcium: 9 mg/dL (ref 8.9–10.3)
Creatinine, Ser: 0.79 mg/dL (ref 0.44–1.00)
GFR calc Af Amer: >60 mL/min (ref >60)
Glucose, Bld: 125 mg/dL — ABNORMAL HIGH (ref 70–99)
Potassium: 3.7 mmol/L (ref 3.5–5.1)
SODIUM: 142 mmol/L (ref 135–145)

## 2018-09-25 MED ORDER — SODIUM CHLORIDE 0.9 % IV SOLN
INTRAVENOUS | Status: DC
  Administered 2018-09-25: 08:00:00 via INTRAVENOUS

## 2018-09-25 MED ORDER — MIDAZOLAM HCL 2 MG/2ML IJ SOLN
INTRAMUSCULAR | Status: AC
  Filled 2018-09-25: qty 4

## 2018-09-25 MED ORDER — TRAMADOL HCL 50 MG PO TABS: ORAL_TABLET | ORAL | 2 refills | Status: DC

## 2018-09-25 MED ORDER — FENTANYL CITRATE (PF) 100 MCG/2ML IJ SOLN
INTRAMUSCULAR | Status: DC | PRN
  Administered 2018-09-25 (×2): 50 ug via INTRAVENOUS

## 2018-09-25 MED ORDER — FENTANYL CITRATE (PF) 100 MCG/2ML IJ SOLN
INTRAMUSCULAR | Status: AC
  Filled 2018-09-25: qty 2

## 2018-09-25 MED ORDER — LIDOCAINE HCL (PF) 1 % IJ SOLN
INTRAMUSCULAR | Status: DC | PRN
  Administered 2018-09-25: 5 mL

## 2018-09-25 MED ORDER — MIDAZOLAM HCL 2 MG/2ML IJ SOLN
INTRAMUSCULAR | Status: DC | PRN
  Administered 2018-09-25 (×2): 1 mg via INTRAVENOUS

## 2018-09-25 NOTE — Discharge Instructions (Signed)
Leave dressing to left flank area intact for 24 hours.  May remove at 10 am 09/26/18.   Per MD: okay to shower tomorrow after dressing is removed and do not submerge area in water for 7 days.   Results will take 2-3 days.   Moderate Conscious Sedation, Adult, Care After These instructions provide you with information about caring for yourself after your procedure. Your health care provider may also give you more specific instructions. Your treatment has been planned according to current medical practices, but problems sometimes occur. Call your health care provider if you have any problems or questions after your procedure. What can I expect after the procedure? After your procedure, it is common:  To feel sleepy for several hours.  To feel clumsy and have poor balance for several hours.  To have poor judgment for several hours.  To vomit if you eat too soon.  Follow these instructions at home: For at least 24 hours after the procedure:   Do not: ? Participate in activities where you could fall or become injured. ? Drive. ? Use heavy machinery. ? Drink alcohol. ? Take sleeping pills or medicines that cause drowsiness. ? Make important decisions or sign legal documents. ? Take care of children on your own.  Rest. Eating and drinking  Follow the diet recommended by your health care provider.  If you vomit: ? Drink water, juice, or soup when you can drink without vomiting. ? Make sure you have little or no nausea before eating solid foods. General instructions  Have a responsible adult stay with you until you are awake and alert.  Take over-the-counter and prescription medicines only as told by your health care provider.  If you smoke, do not smoke without supervision.  Keep all follow-up visits as told by your health care provider. This is important. Contact a health care provider if:  You keep feeling nauseous or you keep vomiting.  You feel light-headed.  You  develop a rash.  You have a fever. Get help right away if:  You have trouble breathing. This information is not intended to replace advice given to you by your health care provider. Make sure you discuss any questions you have with your health care provider. Document Released: 08/21/2013 Document Revised: 04/04/2016 Document Reviewed: 02/20/2016 Elsevier Interactive Patient Education  2018 [employer]. Needle Biopsy of the Bone, Care After Refer to this sheet in the next few weeks. These instructions provide you with information about caring for yourself after your procedure. Your health care provider may also give you more specific instructions. Your treatment has been planned according to current medical practices, but problems sometimes occur. Call your health care provider if you have any problems or questions after your procedure. What can I expect after the procedure? After your procedure, it is common to have soreness or tenderness at the puncture site. Follow these instructions at home:  Take over-the-counter and prescription medicines only as told by your health care provider.  Bathe and shower as told by your health care provider.  Follow instructions from your health care provider about: ? How to take care of your puncture site. ? When and how you should change your bandage (dressing). ? When you should remove your dressing.  Check your puncture site every day for signs of infection. Watch for: ? Redness, swelling, or worsening pain. ? Fluid, blood, or pus.  Return to your normal activities as told by your health care provider.  Keep all follow-up visits as  told by your health care provider. This is important. Contact a health care provider if:  You have redness, swelling, or worsening pain at the site of your puncture.  You have fluid, blood, or pus coming from your puncture site.  You have a fever.  You have persistent nausea or vomiting. Get help right away  if:  You develop a rash.  You have difficulty breathing. This information is not intended to replace advice given to you by your health care provider. Make sure you discuss any questions you have with your health care provider. Document Released: 05/20/2005 Document Revised: 04/07/2016 Document Reviewed: 12/08/2014 Elsevier Interactive Patient Education  [employer].

## 2018-09-25 NOTE — Telephone Encounter (Signed)
Called and spoke with patient regarding increase of pain in left side of back. Pt had a biopsy this morning, she feels it is not related She is requesting something be called in for her increase pain.  MW please advise.

## 2018-09-25 NOTE — Telephone Encounter (Signed)
Called and spoke with pt letting her know that MW said it would be okay for [place] to send Rx of tramadol to pt's pharmacy. Pt expressed understanding.   Called pt's pharmacy and gave a verbal Rx of pt's tramadol to [place]. Nothing further needed.

## 2018-09-25 NOTE — Consult Note (Signed)
Chief Complaint: Patient was seen in consultation today for CT-guided left 10th rib lesion biopsy  Referring Physician(s): Wert,Michael B  Supervising Physician: Corrie Mckusick  Patient Status: Alison Sullivan  History of Present Illness: Alison Sullivan is a 67 y.o. female smoker with history of nephrolithiasis and persistent left-sided flank/abdominal discomfort and recent imaging revealing left upper lobe lung nodule with ipsilateral mediastinal adenopathy concerning for primary bronchogenic carcinoma.  Also noted was a small left adrenal nodule,pleural/extrapleural/hemidiaphragm nodules and pathologic left 10th rib fracture/soft tissue mass.  She presents today for CT-guided left 10th rib mass biopsy for further evaluation.   Past Medical History:  Diagnosis Date  . Arthritis   . Chronic kidney disease   . Dog bite of wrist   . History of kidney stones   . Kidney infection   . Left renal mass   . Osteopenia   . Pyelonephritis   . Recurrent UTI (urinary tract infection)     Past Surgical History:  Procedure Laterality Date  . CYSTOSCOPY WITH RETROGRADE PYELOGRAM, URETEROSCOPY AND STENT PLACEMENT Left 12/28/2017   Procedure: CYSTOSCOPY WITH RETROGRADE PYELOGRAM, URETEROSCOPY AND STENT PLACEMENT, STONE BASKETRY;  Surgeon: Festus Aloe, MD;  Location: [facility];  Service: Urology;  Laterality: Left;  . FINGER SURGERY     index, growth removal  . HOLMIUM LASER APPLICATION Left 9/56/3875   Procedure: HOLMIUM LASER APPLICATION;  Surgeon: Festus Aloe, MD;  Location: [facility];  Service: Urology;  Laterality: Left;  . KIDNEY STONE SURGERY  2011 or 2012  . LITHOTRIPSY  2007    Allergies: Patient has no known allergies.  Medications: Prior to Admission medications   Medication Sig Start Date End Date Taking? Authorizing Provider  Melatonin 10 MG TABS Take by mouth at bedtime.   Yes [provider]  simvastatin (ZOCOR) 20  MG tablet TAKE 1 TABLET(20 MG) BY MOUTH AT BEDTIME 01/25/18  Yes [name], MD     Family History  Problem Relation Age of Onset  . Cancer Father        pancreatic    Social History   Socioeconomic History  . Marital status: Single    Spouse name: Not on file  . Number of children: Not on file  . Years of education: Not on file  . Highest education level: Not on file  Occupational History  . Not on file  Social Needs  . Financial resource strain: Not on file  . Food insecurity:    Worry: Not on file    Inability: Not on file  . Transportation needs:    Medical: Not on file    Non-medical: Not on file  Tobacco Use  . Smoking status: Current Every Day Smoker    Packs/day: 0.50    Years: 30.00    Pack years: 15.00    Types: Cigarettes  . Smokeless tobacco: Never Used  Substance and Sexual Activity  . Alcohol use: Yes    Comment: occ  . Drug use: No  . Sexual activity: Not on file  Lifestyle  . Physical activity:    Days per week: Not on file    Minutes per session: Not on file  . Stress: Not on file  Relationships  . Social connections:    Talks on phone: Not on file    Gets together: Not on file    Attends religious service: Not on file    Active member of club or organization: Not on file  Attends meetings of clubs or organizations: Not on file    Relationship status: Not on file  Other Topics Concern  . Not on file  Social History Narrative  . Not on file      Review of Systems see above; denies fever, headache, chest pain, dyspnea, cough, nausea, vomiting or bleeding  Vital Signs: Blood pressure 130/81, temperature 98.4, heart rate 90, respirations 18, O2 sat 93% RA   Physical Exam awake, alert.  Chest with distant but clear breath sounds bilaterally.  Heart with regular rate and rhythm.  Abdomen soft, positive bowel sounds, tender left lateral abdominal/ left flank region; no lower extremity edema  Imaging: Nm Pet Image Initial (pi)  Skull Base To Thigh  Addendum Date: 09/19/2018   ADDENDUM REPORT: 09/19/2018 10:06 ADDENDUM: The following was annotated on CT PET images but not included in the original report: Prepericardiac lymph node measures 1.7 cm with an SUV max of 16.3. Electronically Signed   By: Lorin Picket M.D.   On: 09/19/2018 10:06   Result Date: 09/19/2018 CLINICAL DATA:  Initial treatment strategy for pulmonary nodule. EXAM: NUCLEAR MEDICINE PET SKULL BASE TO THIGH TECHNIQUE: 7.5 mCi F-18 FDG was injected intravenously. Full-ring PET imaging was performed from the skull base to thigh after the radiotracer. CT data was obtained and used for attenuation correction and anatomic localization. Fasting blood glucose: 113 mg/dl COMPARISON:  CT chest 08/21/2018, CT abdomen pelvis 06/26/2018. FINDINGS: Mediastinal blood pool activity: SUV max 2.7 NECK: No abnormal hypermetabolism. Incidental CT findings: Negative. CHEST: Prevascular lymph nodes measure up to 1.3 cm (CT image 58) with an SUV max 20.4. No hypermetabolic hilar or axillary adenopathy. Spiculated left upper lobe nodule measures 1.8 x 1.9 cm (series 8, image 25) with an SUV max of 19.0. There is amorphous ground-glass in the subpleural aspect of the superior segment right lower lobe (series 8, image 21), with an SUV max 12.3. Low level hypermetabolism within mixed ground-glass and consolidation in the apical segment right upper lobe (SUV max 2.7). There is associated bronchiectasis and volume loss. A 1.0 x 1.8 cm extrapleural nodule in the inferomedial left hemithorax (series 4, image 96) has an SUV max of 11.2. Additional hypermetabolic nodules are seen along the inferior left pleura/left hemidiaphragm. Incidental CT findings: Atherosclerotic calcification of the arterial vasculature. No pericardial or pleural effusion. Centrilobular emphysema. ABDOMEN/PELVIS: No abnormal hypermetabolism in the liver, adrenal glands, spleen or pancreas. No hypermetabolic lymph nodes.  Incidental CT findings: Subcentimeter low-attenuation lesions in the liver are too small to characterize. Liver, gallbladder and adrenal glands are otherwise unremarkable. Multiple stones in the kidneys bilaterally. Low-attenuation lesions in the kidneys are difficult to further characterize without post-contrast imaging. Visualized portions of the spleen, pancreas, stomach and bowel are grossly unremarkable. Atherosclerotic calcification of the arterial vasculature without abdominal aortic aneurysm. SKELETON: Intense hypermetabolism associated with the left tenth posterolateral rib, where there is a fracture and soft tissue mass, measuring 2.8 x 5.0 cm (series 4, image 113), with an SUV max of 19 6. No additional abnormal osseous hypermetabolism. Hypermetabolism is seen in association with the cartilage of the anterior left sixth rib (image 97) with a soft tissue nodule measuring 9 x 14 mm. Lucency within the lateral aspect of the left second rib (image 53) does not show abnormal hypermetabolism. Incidental CT findings: None. IMPRESSION: 1. Findings are most consistent with stage IV lung cancer as evidenced by hypermetabolism associated with a left upper lobe nodule, prevascular adenopathy, pleural/extrapleural/hemidiaphragm nodules and pathologic left rib  fracture. 2. Amorphous ground-glass in the superior segment right lower lobe persists from 08/21/2018 and shows abnormal hypermetabolism. While this may be infectious or inflammatory in etiology, underlying adenocarcinoma cannot be excluded. Continued attention on follow-up exams is warranted. 3. Bilateral renal stones. 4.  Aortic atherosclerosis (ICD10-170.0). Electronically Signed: By: Lorin Picket M.D. On: 09/19/2018 08:30    Labs:  CBC: Recent Labs    12/28/17 0643 01/08/18 1050 09/11/18 1703 09/25/18 0730  WBC  --  12.8* 9.9 9.7  HGB 15.5* 15.8* 16.9* 16.3*  HCT  --  47.1* 49.6* 49.8*  PLT  --  457.0* 383.0 342    COAGS: Recent Labs     09/25/18 0730  INR 0.90    BMP: Recent Labs    01/08/18 1050 08/13/18 1257 09/25/18 0730  NA 142 140 142  K 3.9 3.7 3.7  CL 105 104 109  CO2 26 27 23   GLUCOSE 123* 111* 125*  BUN 19 20 17   CALCIUM 9.7 9.3 9.0  CREATININE 0.84 0.79 0.79  GFRNONAA  --   --  >60  GFRAA  --   --  >60    LIVER FUNCTION TESTS: Recent Labs    01/08/18 1050  BILITOT 0.7  AST 18  ALT 15  ALKPHOS 104  PROT 7.5  ALBUMIN 3.8    TUMOR MARKERS: No results for input(s): AFPTM, CEA, CA199, CHROMGRNA in the last 8760 hours.  Assessment and Plan:  67 y.o. female smoker with history of nephrolithiasis and persistent left-sided flank/abdominal discomfort and recent imaging revealing left upper lobe lung nodule with ipsilateral mediastinal adenopathy concerning for primary bronchogenic carcinoma.  Also noted was a small left adrenal nodule,pleural/extrapleural/hemidiaphragm nodules and pathologic left 10th rib fracture/soft tissue mass.  She presents today for CT-guided left 10th rib mass biopsy for further evaluation.Risks and benefits discussed with the patient including, but not limited to bleeding, infection, damage to adjacent structures or low yield requiring additional tests.  All of the patient's questions were answered, patient is agreeable to proceed. Consent signed and in chart.     Thank you for this interesting consult.  I greatly enjoyed meeting Alison Sullivan and look forward to participating in their care.  A copy of this report was sent to the requesting provider on this date.  Electronically Signed: D. Rowe Robert, PA-C 09/25/2018, 8:30 AM   I spent a total of  25 minutes   in face to face in clinical consultation, greater than 50% of which was counseling/coordinating care for CT-guided left 10th rib lesion biopsy

## 2018-09-25 NOTE — Progress Notes (Signed)
Spoke with Son, Hadley Pen via phone.  Son is at pt's home awaiting call to come pick pt up.  Informed pt's son it would probably be around 7 when we call him back.  Son voiced understanding.

## 2018-09-25 NOTE — Telephone Encounter (Signed)
Tramadol 50 mg 1-2 every 4 hours if needed  #40  Refill x 2

## 2018-09-25 NOTE — Procedures (Signed)
Interventional Radiology Procedure Note  Procedure: Image guided left rib mass. Mx core biopsy. .  Complications: None Recommendations:  - Ok to shower tomorrow - Do not submerge for 7 days - Routine care   Signed,  Dulcy Fanny. Earleen Newport, DO

## 2018-10-01 ENCOUNTER — Ambulatory Visit (HOSPITAL_COMMUNITY): Payer: Medicare Other

## 2018-10-02 ENCOUNTER — Other Ambulatory Visit: Payer: Self-pay | Admitting: Internal Medicine

## 2018-10-02 ENCOUNTER — Telehealth: Payer: Self-pay | Admitting: Internal Medicine

## 2018-10-02 DIAGNOSIS — R911 Solitary pulmonary nodule: Secondary | ICD-10-CM

## 2018-10-02 MED ORDER — OXYCODONE HCL 5 MG PO TABS: ORAL_TABLET | ORAL | 0 refills | Status: DC

## 2018-10-02 NOTE — Progress Notes (Incomplete)
Back pain related to met ca, not better on tramadol

## 2018-10-02 NOTE — Progress Notes (Signed)
See telephone note dated 10/02/18

## 2018-10-02 NOTE — Telephone Encounter (Signed)
Can't do on epic, sending oxyir 5 mg 1-2 q4h for pain from met ca

## 2018-10-02 NOTE — Addendum Note (Signed)
Addended by: Christinia Gully B on: 10/02/2018 05:21 PM   Modules accepted: Orders

## 2018-10-02 NOTE — Telephone Encounter (Signed)
Spoke with pt, she advised me that Dr. Melvyn Novas just called her to give the results. Pt was upset about her calls not coming through but I explained to her Friday that we moved into the new building and that may have held up the calls. Pt understood and nothing further is needed.

## 2018-10-02 NOTE — Telephone Encounter (Signed)
Per result note you wanted her to take Oxy IR Can you send to her pharm with the thumbprint app or will she have to come and pick this up  This med can not be called in or faxed I went ahead and referred her to oncology

## 2018-10-04 ENCOUNTER — Ambulatory Visit: Payer: Medicare Other | Admitting: Internal Medicine

## 2018-10-04 NOTE — Progress Notes (Signed)
Thoracic Location of Tumor / Histology:  PET 09/18/18 hypermetabolism associated with a left upper lobe nodule, prevascular adenopathy, pleural/extrapleural/hemidiaphragm nodules and pathologic left rib fracture Amorphous ground-glass in the superior segment right lower lobe persists from 08/21/2018 and shows abnormal hypermetabolism. While this may be infectious or inflammatory in etiology, underlying adenocarcinoma cannot be excluded.  Patient presented months ago with symptoms of: She reported left sided lower back back for 2-3 weeks and imaging was performed.   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 She is smoking about 3-5 cigarettes daily. She is decreasing her cigarette use.   Past/Anticipated interventions by cardiothoracic surgery, if any: N/A  Past/Anticipated interventions by medical oncology, if any:  Dr. Julien Nordmann 10/18/18 Thoracic clinic  Sites of Visceral and Bony Metastatic Disease: left lower rib.   Signs/Symptoms  Weight changes, if any: She has gained about 3-4 lbs since diagnosis   Respiratory complaints, if any: She denies.   Hemoptysis, if any: She denies.   Pain issues, if any:  Yes, she reports pain to her mid to lower back. She is taking oxycodone for pain, but has many questions regarding its use.   SAFETY ISSUES:  Prior radiation? No  Pacemaker/ICD? No  Possible current pregnancy? No  Is the patient on methotrexate? No  Current Complaints / other details:    BP 114/69 (BP Location: Left Arm, Patient Position: Sitting)   Pulse 77   Temp 98.3 F (36.8 C) (Oral)   Resp 20   Ht 5\' 7"  (1.702 m)   Wt 152 lb 9.6 oz (69.2 kg)   SpO2 96%   BMI 23.90 kg/m    Wt Readings from Last 3 Encounters:  10/10/18 152 lb 9.6 oz (69.2 kg)   09/11/18 152 lb (68.9 kg)  08/09/18 151 lb (68.5 kg)

## 2018-10-05 ENCOUNTER — Telehealth: Payer: Self-pay | Admitting: *Deleted

## 2018-10-05 DIAGNOSIS — R918 Other nonspecific abnormal finding of lung field: Secondary | ICD-10-CM

## 2018-10-05 NOTE — Telephone Encounter (Signed)
Oncology Nurse Navigator Documentation  Oncology Nurse Navigator Flowsheets 10/05/2018  Navigator Location [facility]  Referral date to RadOnc/MedOnc 10/05/2018  Navigator Encounter Type Telephone/I received referral on Ms. Blasingame today.  I called and spoke with her.  She is scheduled to see Dr. Julien Nordmann on 10/18/18 at thoracic clinic.  She verbalized understanding of appt time and place.   Telephone Outgoing Call  Treatment Phase Pre-Tx/Tx Discussion  Barriers/Navigation Needs Education;Coordination of Care  Education Other  Interventions Coordination of Care;Education  Coordination of Care Appts  Education Method Verbal  Acuity Level 2  Time Spent with Patient 30

## 2018-10-10 ENCOUNTER — Ambulatory Visit
Admission: RE | Admit: 2018-10-10 | Discharge: 2018-10-10 | Disposition: A | Discharge status: Home / Self Care | Payer: Medicare Other | Source: Ambulatory Visit | Attending: Radiation Oncology | Admitting: Radiation Oncology

## 2018-10-10 ENCOUNTER — Encounter: Payer: Self-pay | Admitting: Radiation Oncology

## 2018-10-10 ENCOUNTER — Other Ambulatory Visit: Payer: Self-pay

## 2018-10-10 VITALS — BP 114/69 | HR 77 | Temp 98.3°F | Resp 20 | Ht 67.0 in | Wt 152.6 lb

## 2018-10-10 DIAGNOSIS — C7A00 Malignant carcinoid tumor of unspecified site: Secondary | ICD-10-CM | POA: Diagnosis not present

## 2018-10-10 DIAGNOSIS — Z79899 Other long term (current) drug therapy: Secondary | ICD-10-CM | POA: Diagnosis not present

## 2018-10-10 DIAGNOSIS — Z716 Tobacco abuse counseling: Secondary | ICD-10-CM | POA: Diagnosis not present

## 2018-10-10 DIAGNOSIS — R911 Solitary pulmonary nodule: Secondary | ICD-10-CM | POA: Diagnosis not present

## 2018-10-10 DIAGNOSIS — C7B8 Other secondary neuroendocrine tumors: Principal | ICD-10-CM

## 2018-10-10 DIAGNOSIS — F17210 Nicotine dependence, cigarettes, uncomplicated: Secondary | ICD-10-CM | POA: Diagnosis not present

## 2018-10-10 DIAGNOSIS — C7951 Secondary malignant neoplasm of bone: Secondary | ICD-10-CM | POA: Diagnosis not present

## 2018-10-10 DIAGNOSIS — C7A8 Other malignant neuroendocrine tumors: Secondary | ICD-10-CM

## 2018-10-10 NOTE — Progress Notes (Signed)
Radiation Oncology         [phone]) [phone] ________________________________  Initial outpatient Consultation  Name: Alison Sullivan MRN: [id number]  Date: 10/10/2018  DOB: 12/09/1950  [id number], Real Cons, MD  Tanda Rockers, MD   REFERRING PHYSICIAN: Tanda Rockers, MD  DIAGNOSIS:    ICD-10-CM   1. Pulmonary nodule R91.1 Ambulatory referral to Social Work  2. 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 female who presented with  left sided lower back back 4 months ago. It took a while to arrive at a diagnosis.     I've reviewed her CT 08-21-18 that showed a left upper lobe nodule with ipsilateral mediastinal adenopathy, findings most consistent with primary bronchogenic Carcinoma. I reviewed her PET 09/18/18 showing hypermetabolism associated with a left upper lobe nodule, prevascular adenopathy, pleural/extrapleural/hemidiaphragm nodules and pathologic left rib fracture amorphous ground-glass in the superior segment right lower lobe persists from 08/21/2018 and shows abnormal hypermetabolism.   Biopsies of left lower rib mass revealed:   09/25/18 Diagnosis Soft Tissue Needle Core Biopsy, chest wall mass, left lower rib - METASTATIC HIGH GRADE NEUROENDOCRINE CARCINOMA WITH NECROSIS, CONSISTENT WITH LUNG PRIMARY  Tobacco/Marijuana/Snuff/ETOH use: She is a current smoker. She is smoking about 3-5 cigarettes daily. She is decreasing her cigarette use.   Past/Anticipated interventions by cardiothoracic surgery, if any: N/A  Past/Anticipated interventions by medical oncology, if any:  Dr. Julien Nordmann 10/18/18 Thoracic clinic   Weight changes, if any: She has gained about 3-4 lbs since diagnosis   Respiratory complaints, if any: She denies.   Hemoptysis, if any: She denies.   Pain issues, if any:  Yes, she  reports pain to her mid to lower back. She is taking oxycodone for pain, but has many questions regarding its use.   SAFETY ISSUES:  Prior radiation? No  Pacemaker/ICD? No  Possible current pregnancy? No  Is the patient on methotrexate? No  She has a slight occasional cough.  PREVIOUS RADIATION THERAPY: No  PAST MEDICAL HISTORY:  has a past medical history of Arthritis, Chronic kidney disease, Dog bite of wrist, History of kidney stones, Kidney infection, Left renal mass, Osteopenia, Pyelonephritis, and Recurrent UTI (urinary tract infection).    PAST SURGICAL HISTORY: Past Surgical History:  Procedure Laterality Date  . CYSTOSCOPY WITH RETROGRADE PYELOGRAM, URETEROSCOPY AND STENT PLACEMENT Left 12/28/2017   Procedure: CYSTOSCOPY WITH RETROGRADE PYELOGRAM, URETEROSCOPY AND STENT PLACEMENT, STONE BASKETRY;  Surgeon: Festus Aloe, MD;  Location: [facility];  Service: Urology;  Laterality: Left;  . FINGER SURGERY     index, growth removal  . HOLMIUM LASER APPLICATION Left 5/88/5027   Procedure: HOLMIUM LASER APPLICATION;  Surgeon: Festus Aloe, MD;  Location: [facility];  Service: Urology;  Laterality: Left;  . KIDNEY STONE SURGERY  2011 or 2012  . LITHOTRIPSY  2007    FAMILY HISTORY: family history includes Cancer in her father.  SOCIAL HISTORY:  reports that she has been smoking cigarettes. She has a 7.50 pack-year smoking history. She has never used smokeless tobacco. She reports that she drinks alcohol. She reports that she does not use drugs.  ALLERGIES: Patient has no known allergies.  MEDICATIONS:  Current Outpatient Medications  Medication Sig Dispense Refill  . Melatonin 10 MG TABS Take by mouth at bedtime.    Marland Kitchen oxyCODONE (OXY IR/ROXICODONE)  5 MG immediate release tablet 1-2 every 4 hours if needed for pain 60 tablet 0  . simvastatin (ZOCOR) 20 MG tablet TAKE 1 TABLET(20 MG) BY MOUTH AT BEDTIME 90 tablet 3  . traMADol (ULTRAM)  50 MG tablet Take 1-2 tabs every 4 hours if needed. (Patient not taking: Reported on 10/10/2018) 40 tablet 2   No current facility-administered medications for this encounter.     REVIEW OF SYSTEMS:  A 10+ POINT REVIEW OF SYSTEMS WAS OBTAINED including neurology, dermatology, psychiatry, cardiac, respiratory, lymph, extremities, GI, GU, Musculoskeletal, constitutional, breasts, reproductive, HEENT.  All pertinent positives are noted in the HPI.  All others are negative.   PHYSICAL EXAM:  height is 5\' 7"  (1.702 m) and weight is 152 lb 9.6 oz (69.2 kg). Her oral temperature is 98.3 F (36.8 C). Her blood pressure is 114/69 and her pulse is 77. Her respiration is 20 and oxygen saturation is 96%.   General: Alert and oriented, in no acute distress HEENT: Head is normocephalic. Extraocular movements are intact. Oropharynx is clear. Neck: Neck is supple, no palpable cervical or supraclavicular lymphadenopathy. Heart: Regular in rate and rhythm with no murmurs, rubs, or gallops. Chest: Clear to auscultation bilaterally, with no rhonchi, wheezes, or rales. Abdomen: Soft, nontender, nondistended, with no rigidity or guarding. Extremities: No cyanosis or edema. Lymphatics: see Neck Exam Skin: No concerning lesions. Musculoskeletal: symmetric strength and muscle tone throughout.  Swelling in region of left lower rib mass.  Tenderness to palpation in this area. Neurologic: Cranial nerves II through XII are grossly intact. No obvious focalities. Speech is fluent. Coordination is intact. Psychiatric: Judgment and insight are intact. Affect is appropriate.   ECOG = 1  0 - Asymptomatic (Fully active, able to carry on all predisease activities without restriction)  1 - Symptomatic but completely ambulatory (Restricted in physically strenuous activity but ambulatory and able to carry out work of a light or sedentary nature. For example, light housework, office work)  2 - Symptomatic, <50% in bed during  the day (Ambulatory and capable of all self care but unable to carry out any work activities. Up and about more than 50% of waking hours)  3 - Symptomatic, >50% in bed, but not bedbound (Capable of only limited self-care, confined to bed or chair 50% or more of waking hours)  4 - Bedbound (Completely disabled. Cannot carry on any self-care. Totally confined to bed or chair)  5 - Death   Eustace Pen MM, Creech RH, Tormey DC, et al. [phone]). "Toxicity and response criteria of the [facility]". [place] Oncol. 5 (6): 649-55   LABORATORY DATA:  Lab Results  Component Value Date   WBC 9.7 09/25/2018   HGB 16.3 (H) 09/25/2018   HCT 49.8 (H) 09/25/2018   MCV 94.1 09/25/2018   PLT 342 09/25/2018   CMP     Component Value Date/Time   NA 142 09/25/2018 0730   K 3.7 09/25/2018 0730   CL 109 09/25/2018 0730   CO2 23 09/25/2018 0730   GLUCOSE 125 (H) 09/25/2018 0730   BUN 17 09/25/2018 0730   CREATININE 0.79 09/25/2018 0730   CALCIUM 9.0 09/25/2018 0730   PROT 7.5 01/08/2018 1050   ALBUMIN 3.8 01/08/2018 1050   AST 18 01/08/2018 1050   ALT 15 01/08/2018 1050   ALKPHOS 104 01/08/2018 1050   BILITOT 0.7 01/08/2018 1050   GFRNONAA >60 09/25/2018 0730   GFRAA >60 09/25/2018 0730  RADIOGRAPHY: Nm Pet Image Initial (pi) Skull Base To Thigh  Addendum Date: 09/19/2018   ADDENDUM REPORT: 09/19/2018 10:06 ADDENDUM: The following was annotated on CT PET images but not included in the original report: Prepericardiac lymph node measures 1.7 cm with an SUV max of 16.3. Electronically Signed   By: Lorin Picket M.D.   On: 09/19/2018 10:06   Result Date: 09/19/2018 CLINICAL DATA:  Initial treatment strategy for pulmonary nodule. EXAM: NUCLEAR MEDICINE PET SKULL BASE TO THIGH TECHNIQUE: 7.5 mCi F-18 FDG was injected intravenously. Full-ring PET imaging was performed from the skull base to thigh after the radiotracer. CT data was obtained and used for attenuation  correction and anatomic localization. Fasting blood glucose: 113 mg/dl COMPARISON:  CT chest 08/21/2018, CT abdomen pelvis 06/26/2018. FINDINGS: Mediastinal blood pool activity: SUV max 2.7 NECK: No abnormal hypermetabolism. Incidental CT findings: Negative. CHEST: Prevascular lymph nodes measure up to 1.3 cm (CT image 58) with an SUV max 20.4. No hypermetabolic hilar or axillary adenopathy. Spiculated left upper lobe nodule measures 1.8 x 1.9 cm (series 8, image 25) with an SUV max of 19.0. There is amorphous ground-glass in the subpleural aspect of the superior segment right lower lobe (series 8, image 21), with an SUV max 12.3. Low level hypermetabolism within mixed ground-glass and consolidation in the apical segment right upper lobe (SUV max 2.7). There is associated bronchiectasis and volume loss. A 1.0 x 1.8 cm extrapleural nodule in the inferomedial left hemithorax (series 4, image 96) has an SUV max of 11.2. Additional hypermetabolic nodules are seen along the inferior left pleura/left hemidiaphragm. Incidental CT findings: Atherosclerotic calcification of the arterial vasculature. No pericardial or pleural effusion. Centrilobular emphysema. ABDOMEN/PELVIS: No abnormal hypermetabolism in the liver, adrenal glands, spleen or pancreas. No hypermetabolic lymph nodes. Incidental CT findings: Subcentimeter low-attenuation lesions in the liver are too small to characterize. Liver, gallbladder and adrenal glands are otherwise unremarkable. Multiple stones in the kidneys bilaterally. Low-attenuation lesions in the kidneys are difficult to further characterize without post-contrast imaging. Visualized portions of the spleen, pancreas, stomach and bowel are grossly unremarkable. Atherosclerotic calcification of the arterial vasculature without abdominal aortic aneurysm. SKELETON: Intense hypermetabolism associated with the left tenth posterolateral rib, where there is a fracture and soft tissue mass, measuring 2.8 x  5.0 cm (series 4, image 113), with an SUV max of 19 6. No additional abnormal osseous hypermetabolism. Hypermetabolism is seen in association with the cartilage of the anterior left sixth rib (image 97) with a soft tissue nodule measuring 9 x 14 mm. Lucency within the lateral aspect of the left second rib (image 53) does not show abnormal hypermetabolism. Incidental CT findings: None. IMPRESSION: 1. Findings are most consistent with stage IV lung cancer as evidenced by hypermetabolism associated with a left upper lobe nodule, prevascular adenopathy, pleural/extrapleural/hemidiaphragm nodules and pathologic left rib fracture. 2. Amorphous ground-glass in the superior segment right lower lobe persists from 08/21/2018 and shows abnormal hypermetabolism. While this may be infectious or inflammatory in etiology, underlying adenocarcinoma cannot be excluded. Continued attention on follow-up exams is warranted. 3. Bilateral renal stones. 4.  Aortic atherosclerosis (ICD10-170.0). Electronically Signed: By: Lorin Picket M.D. On: 09/19/2018 08:30   Ct Biopsy  Result Date: 09/25/2018 INDICATION: 67 year old female with a new lung mass concerning for primary lung carcinoma and multiple regions of metastases. She presents today for biopsy of chest wall mass. EXAM: CT BIOPSY MEDICATIONS: None. ANESTHESIA/SEDATION: Moderate (conscious) sedation was employed during this procedure. A total of Versed 2.0 mg  and Fentanyl 100 mcg was administered intravenously. Moderate Sedation Time: 10 minutes. The patient's level of consciousness and vital signs were monitored continuously by radiology nursing throughout the procedure under my direct supervision. FLUOROSCOPY TIME:  CT COMPLICATIONS: None PROCEDURE: Informed written consent was obtained from the patient after a thorough discussion of the procedural risks, benefits and alternatives. All questions were addressed. Maximal Sterile Barrier Technique was utilized including caps,  mask, sterile gowns, sterile gloves, sterile drape, hand hygiene and skin antiseptic. A timeout was performed prior to the initiation of the procedure. Patient position prone position on the CT gantry. Scout CT images were acquired. The patient was then prepped and draped in the usual sterile fashion. 1% lidocaine was used for local anesthesia. Using CT guidance, guide needle was advanced into the soft tissue mass of the left lower rib, rib 10. Multiple 18 gauge core biopsy were acquired. Patient tolerated the procedure well and remained hemodynamically stable throughout. No complications were encountered and no significant blood loss. IMPRESSION: Status post CT-guided biopsy of left tenth rib soft tissue lesion. Tissue specimen sent to pathology for complete histopathologic analysis. Signed, Dulcy Fanny. Dellia Nims, RPVI [facility] Radiology Electronically Signed   By: Corrie Mckusick D.O.   On: 09/25/2018 11:51      IMPRESSION/PLAN: Metastatic lung cancer  Today, I talked to the patient and her son about the findings and work-up thus far.We reviewed her imaging together We discussed the patient's diagnosis of metastatic lung cancer to bone and general treatment for this, highlighting the role of radiotherapy in the management. We discussed the available radiation techniques, and focused on the details of logistics and delivery.  They understand systemic therapy will be necessary to treat most of the lesions on her imaging.  Radiotherapy to the painful left lower rib mass could provide pain alleviation.  We discussed a 1 fraction vs 10 fraction course - including pros and cons of each. She would like a 10 fraction course of palliative RT.  We discussed the risks, benefits, and side effects of radiotherapy. Side effects may include but not necessarily be limited to: skin irritation, fatigue, rare kidney injury which I'll take care to decrease the risk of with my  techniques. No guarantees of treatment were given. A consent form was signed and placed in the patient's medical record. The patient was encouraged to ask questions that I answered to the best of my ability.   Simulation will occur next week.  MRI brain will be ordered for staging.   I asked the patient today about tobacco use. The patient uses tobacco.  I advised the patient to quit. Services were offered by me today including outpatient counseling and pharmacotherapy. I assessed for the willingness to attempt to quit and provided encouragement and demonstrated willingness to make referrals and/or prescriptions to help the patient attempt to quit. The patient has follow-up with the oncologic team to touch base on their tobacco use and /or cessation efforts.  Over 3 minutes were spent on this issue. She will continue to taper on her own, and plans a quit date of 11-14-18.   __________________________________________   [name], MD

## 2018-10-12 ENCOUNTER — Encounter: Payer: Self-pay | Admitting: General Practice

## 2018-10-12 NOTE — Progress Notes (Signed)
[place] Psychosocial Distress Screening Clinical Social Work  Clinical Social Work was referred by distress screening protocol.  The patient scored a 8 on the Psychosocial Distress Thermometer which indicates moderate distress. Clinical Social Worker contacted patient by phone to assess for distress and other psychosocial needs. "Im OK, the consultation w Dr Isidore Moos helped a lot."  Worries were heightened due to lack of information, appreciated consultation and chance to ask questions and learn more about her diagnosis and treatment plan.  No needs for help at this time, but "I could forsee issues in the future."  May need help w transportation.  Does not have family in town, son lives out of town (hour away).  Is an avid hiker, has completed hikes in 3/4 of the state parks in [place].  Likes to hike on her own, is used to being self sufficient and independent in all aspects of life.  CSW will mail information packet on resources available through [facility], encouraged patient to contact as needed.     ONCBCN DISTRESS SCREENING 10/10/2018  Screening Type Initial Screening  Distress experienced in past week (1-10) 8  Emotional problem type Depression;Nervousness/Anxiety  Information Concerns Type Lack of info about diagnosis;Lack of info about treatment;Lack of info about complementary therapy choices;Lack of info about maintaining fitness  Physical Problem type Pain;Sleep/insomnia    Clinical Social Worker follow up needed: No.  If yes, follow up plan:  Beverely Pace, [place], LCSW Clinical Social Worker Phone:  [phone]

## 2018-10-15 ENCOUNTER — Ambulatory Visit: Payer: Medicare Other | Admitting: Radiation Oncology

## 2018-10-16 ENCOUNTER — Other Ambulatory Visit: Payer: Self-pay | Admitting: Radiation Therapy

## 2018-10-16 ENCOUNTER — Other Ambulatory Visit: Payer: Self-pay | Admitting: Radiation Oncology

## 2018-10-16 ENCOUNTER — Telehealth: Payer: Self-pay | Admitting: Radiation Therapy

## 2018-10-16 ENCOUNTER — Ambulatory Visit
Admission: RE | Admit: 2018-10-16 | Discharge: 2018-10-16 | Disposition: A | Discharge status: Home / Self Care | Payer: Medicare Other | Source: Ambulatory Visit | Attending: Radiation Oncology | Admitting: Radiation Oncology

## 2018-10-16 DIAGNOSIS — C7951 Secondary malignant neoplasm of bone: Secondary | ICD-10-CM

## 2018-10-16 DIAGNOSIS — C3490 Malignant neoplasm of unspecified part of unspecified bronchus or lung: Secondary | ICD-10-CM

## 2018-10-16 DIAGNOSIS — Z923 Personal history of irradiation: Secondary | ICD-10-CM | POA: Insufficient documentation

## 2018-10-16 MED ORDER — OXYCODONE HCL 5 MG PO TABS: ORAL_TABLET | ORAL | 0 refills | Status: DC

## 2018-10-16 NOTE — Telephone Encounter (Signed)
Called to let Alison Sullivan know about her upcoming brain MRI on 12/9. While on the phone she said that she was waiting on a response for a pain med request as well. I let Anderson Malta, Dr. Pearlie Oyster nurse know. She will follow-up with Dr. Isidore Moos about this.   Mont Dutton R.T.(R)(T)

## 2018-10-17 DIAGNOSIS — C7951 Secondary malignant neoplasm of bone: Secondary | ICD-10-CM | POA: Diagnosis not present

## 2018-10-17 DIAGNOSIS — Z923 Personal history of irradiation: Secondary | ICD-10-CM | POA: Diagnosis not present

## 2018-10-17 NOTE — Progress Notes (Signed)
  Radiation Oncology         [phone] ________________________________  Name: Alison Sullivan MRN: [id number]  Date: 10/16/2018  DOB: March 02, 1951  SIMULATION AND TREATMENT PLANNING NOTE  Outpatient  DIAGNOSIS:     ICD-10-CM   1. Bone metastases ([place]) C79.51     NARRATIVE:  The patient was brought to the [facility].  Identity was confirmed.  All relevant records and images related to the planned course of therapy were reviewed.  The patient freely provided informed written consent to proceed with treatment after reviewing the details related to the planned course of therapy. The consent form was witnessed and verified by the simulation staff.    Then, the patient was set-up in a stable reproducible supine position for radiation therapy.  CT images were obtained.  Surface markings were placed.  The CT images were loaded into the planning software.    TREATMENT PLANNING NOTE: Treatment planning then occurred.  The radiation prescription was entered and confirmed.   A total of 2 medically necessary complex treatment devices were fabricated and supervised by me, in the form of 2 fields with MLCs to block left kidney and spinal cord. MORE FIELDS WITH MLCs MAY BE ADDED IN DOSIMETRY for dose homogeneity.  I have requested : 3D Simulation  I have requested a DVH of the following structures: cord, kidney, CTV/target.    The patient will receive 30 Gy in 10 fractions to the dominant disease in the left lower rib cage. -----------------------------------  [name], MD

## 2018-10-18 ENCOUNTER — Inpatient Hospital Stay: Payer: Medicare Other | Attending: Internal Medicine | Admitting: Internal Medicine

## 2018-10-18 ENCOUNTER — Inpatient Hospital Stay: Payer: Medicare Other

## 2018-10-18 ENCOUNTER — Encounter: Payer: Self-pay | Admitting: Internal Medicine

## 2018-10-18 VITALS — BP 132/86 | HR 99 | Temp 98.3°F | Resp 18 | Ht 67.0 in | Wt 150.0 lb

## 2018-10-18 DIAGNOSIS — R918 Other nonspecific abnormal finding of lung field: Secondary | ICD-10-CM

## 2018-10-18 DIAGNOSIS — Z79899 Other long term (current) drug therapy: Secondary | ICD-10-CM | POA: Insufficient documentation

## 2018-10-18 DIAGNOSIS — R5382 Chronic fatigue, unspecified: Secondary | ICD-10-CM

## 2018-10-18 DIAGNOSIS — N390 Urinary tract infection, site not specified: Secondary | ICD-10-CM | POA: Diagnosis not present

## 2018-10-18 DIAGNOSIS — C7A1 Malignant poorly differentiated neuroendocrine tumors: Secondary | ICD-10-CM | POA: Insufficient documentation

## 2018-10-18 DIAGNOSIS — Z7189 Other specified counseling: Secondary | ICD-10-CM

## 2018-10-18 DIAGNOSIS — R5383 Other fatigue: Secondary | ICD-10-CM | POA: Insufficient documentation

## 2018-10-18 DIAGNOSIS — N189 Chronic kidney disease, unspecified: Secondary | ICD-10-CM | POA: Diagnosis not present

## 2018-10-18 DIAGNOSIS — I700 Atherosclerosis of aorta: Secondary | ICD-10-CM | POA: Insufficient documentation

## 2018-10-18 DIAGNOSIS — C7B09 Secondary carcinoid tumors of other sites: Secondary | ICD-10-CM | POA: Diagnosis not present

## 2018-10-18 DIAGNOSIS — Z5111 Encounter for antineoplastic chemotherapy: Secondary | ICD-10-CM | POA: Insufficient documentation

## 2018-10-18 DIAGNOSIS — Z5112 Encounter for antineoplastic immunotherapy: Secondary | ICD-10-CM | POA: Insufficient documentation

## 2018-10-18 DIAGNOSIS — G8929 Other chronic pain: Secondary | ICD-10-CM

## 2018-10-18 DIAGNOSIS — M8589 Other specified disorders of bone density and structure, multiple sites: Secondary | ICD-10-CM | POA: Diagnosis not present

## 2018-10-18 DIAGNOSIS — F17210 Nicotine dependence, cigarettes, uncomplicated: Secondary | ICD-10-CM | POA: Diagnosis not present

## 2018-10-18 DIAGNOSIS — M545 Low back pain: Secondary | ICD-10-CM

## 2018-10-18 DIAGNOSIS — C3492 Malignant neoplasm of unspecified part of left bronchus or lung: Secondary | ICD-10-CM | POA: Insufficient documentation

## 2018-10-18 LAB — CBC WITH DIFFERENTIAL (CANCER CENTER ONLY)
Abs Immature Granulocytes: 0.03 10*3/uL (ref 0.00–0.07)
BASOS PCT: 1 %
Basophils Absolute: 0.1 10*3/uL (ref 0.0–0.1)
EOS ABS: 0.4 10*3/uL (ref 0.0–0.5)
EOS PCT: 4 %
HCT: 48 % — ABNORMAL HIGH (ref 36.0–46.0)
Hemoglobin: 16.2 g/dL — ABNORMAL HIGH (ref 12.0–15.0)
Immature Granulocytes: 0 %
Lymphocytes Relative: 31 %
Lymphs Abs: 3.3 10*3/uL (ref 0.7–4.0)
MCH: 30.5 pg (ref 26.0–34.0)
MCHC: 33.8 g/dL (ref 30.0–36.0)
MCV: 90.2 fL (ref 80.0–100.0)
MONO ABS: 0.8 10*3/uL (ref 0.1–1.0)
MONOS PCT: 7 %
Neutro Abs: 6.1 10*3/uL (ref 1.7–7.7)
Neutrophils Relative %: 57 %
Platelet Count: 362 10*3/uL (ref 150–400)
RBC: 5.32 MIL/uL — ABNORMAL HIGH (ref 3.87–5.11)
RDW: 12.8 % (ref 11.5–15.5)
WBC Count: 10.7 10*3/uL — ABNORMAL HIGH (ref 4.0–10.5)
nRBC: 0 % (ref 0.0–0.2)

## 2018-10-18 LAB — CMP (CANCER CENTER ONLY)
ALK PHOS: 94 U/L (ref 38–126)
ALT: 16 U/L (ref 0–44)
AST: 22 U/L (ref 15–41)
Albumin: 3.7 g/dL (ref 3.5–5.0)
Anion gap: 13 (ref 5–15)
BILIRUBIN TOTAL: 0.6 mg/dL (ref 0.3–1.2)
BUN: 13 mg/dL (ref 8–23)
CALCIUM: 9.4 mg/dL (ref 8.9–10.3)
CO2: 21 mmol/L — ABNORMAL LOW (ref 22–32)
CREATININE: 0.86 mg/dL (ref 0.44–1.00)
Chloride: 108 mmol/L (ref 98–111)
GFR, Est AFR Am: >60 mL/min (ref >60)
GFR, Estimated: >60 mL/min (ref >60)
Glucose, Bld: 128 mg/dL — ABNORMAL HIGH (ref 70–99)
Potassium: 3.5 mmol/L (ref 3.5–5.1)
Sodium: 142 mmol/L (ref 135–145)
TOTAL PROTEIN: 7.3 g/dL (ref 6.5–8.1)

## 2018-10-18 MED ORDER — PROCHLORPERAZINE MALEATE 10 MG PO TABS: 10.0000 mg | ORAL_TABLET | Freq: Four times a day (QID) | ORAL | 0 refills | Status: DC | PRN

## 2018-10-18 MED ORDER — LIDOCAINE-PRILOCAINE 2.5-2.5 % EX CREA: 1.0000 "application " | TOPICAL_CREAM | CUTANEOUS | 0 refills | Status: AC | PRN

## 2018-10-18 MED ORDER — METHYLPREDNISOLONE 4 MG PO TBPK: ORAL_TABLET | ORAL | 0 refills | Status: DC

## 2018-10-18 NOTE — Progress Notes (Signed)
[place] Telephone:([phone]) [phone]   [phone] Multidisciplinary thoracic oncology clinic  CONSULT NOTE  REFERRING PHYSICIAN: Dr. Christinia Gully  REASON FOR CONSULTATION:  67 years old white female recently diagnosed with lung cancer.  HPI CERINITY ZYNDA is a 67 y.o. female with past medical history significant for osteoarthritis, recurrent urinary tract infection, kidney stone, osteoporosis as well as chronic kidney disease, osteoarthritis and long history of smoking.  The patient mentioned that since July 2019 she has been complaining of pain and the lower and mid back more towards the left side.  She was seen initially by her primary care physician for evaluation and she was treated with prednisone for muscle spasm.  Her pain did not significantly improved and she had x-ray of the left ribs on 08/06/2018 that showed interval development of 1.3 cm nodular opacity in the left midlung as well as opacities in the right upper lobe.  This was followed by CT scan of the chest on 08/21/2018 and that showed spiculated peripheral left upper lobe nodule measuring 1.3 x 1.9 cm.  There was patchy consolidation, groundglass and bronchiectasis in the right upper lobe with associated volume loss.  The scan also showed mediastinal lymphadenopathy measuring up to 1.3 cm in the AP window as well as 0.8 cm left hilar lymph node.  There was also a left peri-pericardiac lymph node measuring 1.6 cm.  The patient was referred to Dr. Melvyn Novas and a PET scan was performed on September 18, 2018 and that showed spiculated left upper lobe nodule measuring 1.8 x 1.9 cm with an SUV max of 19.0.  There was amorphus groundglass in the subpleural aspect of the superior segment right lower lobe with SUV max of 12.3.  There was also 1.0 x 1.8 cm extrapleural nodule in the inferior medial left hemithorax with SUV max of 11.2 and additional hypermetabolic nodule seen along the inferior left pleural/left  hemidiaphragm.  The scan also showed prevascular lymph node measuring 1.3 cm with SUV max of 20.4 and no hypermetabolic hilar or axillary lymph node.  There was also intense hypermetabolism associated with the left 10th posterior lateral rib where there is a fracture and soft tissue mass measuring 2.8 x 5.0 cm with an SUV max of 19.  There was also hypermetabolism associated with the cartilage of the anterior left sixth rib with a soft tissue nodule measuring 0.9 x 1.4 cm and lucency within the lateral aspect of the left second rib with no abnormal hypermetabolism. On 09/25/2018 the patient underwent CT-guided core biopsy of the left dense rib soft tissue lesion by interventional radiology. 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  The immunohistochemical stains showed the metastatic carcinoma cells are positive for synaptophysin, CD 56 and TTF-1 and they are negative for p63, CK 5/6 and Napsin A.  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 Dr. were kindly referred the patient to the multidisciplinary thoracic oncology clinic today for evaluation and recommendation regarding treatment of her condition.  The patient was also referred to Dr. Isidore Moos for consideration of palliative radiotherapy to the painful left 10th rib. When seen today the patient is feeling fine except for the pain on the left side of the chest and she is currently on  oxycodone every 4 hours.  She denied having any significant shortness of breath, cough or hemoptysis.  She denied having any nausea, vomiting, diarrhea or constipation.  She has no weight loss or night sweats.  She has no headache or visual changes. Family history  significant for father died from pancreatic cancer at age 27 and mother had congestive heart failure. The patient is single and has 1 son.  She was accompanied by her son Shea Stakes.  She used to work as a Radio broadcast assistant and currently retired.  She has a history for smoking more than 0.5 pack/day for around 47 years and unfortunately she continues to smoke few cigarettes every day.  She drinks alcohol occasionally and no history of drug abuse.  HPI  Past Medical History:  Diagnosis Date  . Arthritis   . Chronic kidney disease   . Dog bite of wrist   . History of kidney stones   . Kidney infection   . Left renal mass   . Osteopenia   . Pyelonephritis   . Recurrent UTI (urinary tract infection)     Past Surgical History:  Procedure Laterality Date  . CYSTOSCOPY WITH RETROGRADE PYELOGRAM, URETEROSCOPY AND STENT PLACEMENT Left 12/28/2017   Procedure: CYSTOSCOPY WITH RETROGRADE PYELOGRAM, URETEROSCOPY AND STENT PLACEMENT, STONE BASKETRY;  Surgeon: Festus Aloe, MD;  Location: [facility];  Service: Urology;  Laterality: Left;  . FINGER SURGERY     index, growth removal  . HOLMIUM LASER APPLICATION Left 6/38/7564   Procedure: HOLMIUM LASER APPLICATION;  Surgeon: Festus Aloe, MD;  Location: [facility];  Service: Urology;  Laterality: Left;  . KIDNEY STONE SURGERY  2011 or 2012  . LITHOTRIPSY  2007    Family History  Problem Relation Age of Onset  . Cancer Father        pancreatic    Social History Social History   Tobacco Use  . Smoking status: Current Every Day Smoker    Packs/day: 0.25    Years: 30.00    Pack years: 7.50    Types: Cigarettes  . Smokeless tobacco: Never Used  . Tobacco comment: she is smoking 3-6 cigarrettes daily- 10/10/18  Substance Use Topics  . Alcohol use: Yes    Comment: occ  . Drug use: No    No Known Allergies  Current Outpatient Medications  Medication Sig Dispense Refill  . Melatonin 10 MG TABS Take by mouth  at bedtime.    Marland Kitchen oxyCODONE (OXY IR/ROXICODONE) 5 MG immediate release tablet 1-2 every 4 hours if needed for pain 60 tablet 0  . simvastatin (ZOCOR) 20 MG tablet TAKE 1 TABLET(20 MG) BY MOUTH AT BEDTIME 90 tablet 3  . traMADol (ULTRAM) 50 MG tablet Take 1-2 tabs every 4 hours if needed. (Patient not taking: Reported on 10/10/2018) 40 tablet 2   No current facility-administered medications for this visit.     Review of Systems  Constitutional: positive for fatigue Eyes: negative Ears, nose, mouth, throat, and face: negative Respiratory: positive for pleurisy/chest pain Cardiovascular: negative Gastrointestinal: negative Genitourinary:negative Integument/breast: negative Hematologic/lymphatic: negative Musculoskeletal:positive for bone pain Neurological: negative Behavioral/Psych: negative Endocrine: negative Allergic/Immunologic: negative  Physical Exam  PPI:RJJOA, healthy, no distress, well nourished, well developed and anxious SKIN: skin color, texture, turgor are normal, no rashes or significant lesions HEAD: Normocephalic, No masses, lesions, tenderness or abnormalities EYES: normal, PERRLA, Conjunctiva are pink and non-injected EARS: External ears normal, Canals clear OROPHARYNX:no exudate, no erythema and lips, buccal mucosa, and tongue normal  NECK:  supple, no adenopathy, no JVD LYMPH:  no palpable lymphadenopathy, no hepatosplenomegaly BREAST:not examined LUNGS: clear to auscultation , and palpation HEART: regular rate & rhythm, no murmurs and no gallops ABDOMEN:abdomen soft, non-tender, normal bowel sounds and no masses or organomegaly BACK: Back symmetric, no curvature., No CVA tenderness EXTREMITIES:no joint deformities, effusion, or inflammation, no edema  NEURO: alert & oriented x 3 with fluent speech, no focal motor/sensory deficits  PERFORMANCE STATUS: ECOG 1  LABORATORY DATA: Lab Results  Component Value Date   WBC 10.7 (H) 10/18/2018   HGB 16.2 (H)  10/18/2018   HCT 48.0 (H) 10/18/2018   MCV 90.2 10/18/2018   PLT 362 10/18/2018      Chemistry      Component Value Date/Time   NA 142 09/25/2018 0730   K 3.7 09/25/2018 0730   CL 109 09/25/2018 0730   CO2 23 09/25/2018 0730   BUN 17 09/25/2018 0730   CREATININE 0.79 09/25/2018 0730      Component Value Date/Time   CALCIUM 9.0 09/25/2018 0730   ALKPHOS 104 01/08/2018 1050   AST 18 01/08/2018 1050   ALT 15 01/08/2018 1050   BILITOT 0.7 01/08/2018 1050       RADIOGRAPHIC STUDIES: Nm Pet Image Initial (pi) Skull Base To Thigh  Addendum Date: 09/19/2018   ADDENDUM REPORT: 09/19/2018 10:06 ADDENDUM: The following was annotated on CT PET images but not included in the original report: Prepericardiac lymph node measures 1.7 cm with an SUV max of 16.3. Electronically Signed   By: Lorin Picket M.D.   On: 09/19/2018 10:06   Result Date: 09/19/2018 CLINICAL DATA:  Initial treatment strategy for pulmonary nodule. EXAM: NUCLEAR MEDICINE PET SKULL BASE TO THIGH TECHNIQUE: 7.5 mCi F-18 FDG was injected intravenously. Full-ring PET imaging was performed from the skull base to thigh after the radiotracer. CT data was obtained and used for attenuation correction and anatomic localization. Fasting blood glucose: 113 mg/dl COMPARISON:  CT chest 08/21/2018, CT abdomen pelvis 06/26/2018. FINDINGS: Mediastinal blood pool activity: SUV max 2.7 NECK: No abnormal hypermetabolism. Incidental CT findings: Negative. CHEST: Prevascular lymph nodes measure up to 1.3 cm (CT image 58) with an SUV max 20.4. No hypermetabolic hilar or axillary adenopathy. Spiculated left upper lobe nodule measures 1.8 x 1.9 cm (series 8, image 25) with an SUV max of 19.0. There is amorphous ground-glass in the subpleural aspect of the superior segment right lower lobe (series 8, image 21), with an SUV max 12.3. Low level hypermetabolism within mixed ground-glass and consolidation in the apical segment right upper lobe (SUV max 2.7).  There is associated bronchiectasis and volume loss. A 1.0 x 1.8 cm extrapleural nodule in the inferomedial left hemithorax (series 4, image 96) has an SUV max of 11.2. Additional hypermetabolic nodules are seen along the inferior left pleura/left hemidiaphragm. Incidental CT findings: Atherosclerotic calcification of the arterial vasculature. No pericardial or pleural effusion. Centrilobular emphysema. ABDOMEN/PELVIS: No abnormal hypermetabolism in the liver, adrenal glands, spleen or pancreas. No hypermetabolic lymph nodes. Incidental CT findings: Subcentimeter low-attenuation lesions in the liver are too small to characterize. Liver, gallbladder and adrenal glands are otherwise unremarkable. Multiple stones in the kidneys bilaterally. Low-attenuation lesions in the kidneys are difficult to further characterize without post-contrast imaging. Visualized portions of the spleen, pancreas, stomach and bowel are grossly unremarkable. Atherosclerotic calcification of the arterial vasculature without abdominal aortic aneurysm. SKELETON: Intense hypermetabolism associated with the left tenth posterolateral rib, where there is a fracture and soft tissue mass, measuring 2.8  x 5.0 cm (series 4, image 113), with an SUV max of 19 6. No additional abnormal osseous hypermetabolism. Hypermetabolism is seen in association with the cartilage of the anterior left sixth rib (image 97) with a soft tissue nodule measuring 9 x 14 mm. Lucency within the lateral aspect of the left second rib (image 53) does not show abnormal hypermetabolism. Incidental CT findings: None. IMPRESSION: 1. Findings are most consistent with stage IV lung cancer as evidenced by hypermetabolism associated with a left upper lobe nodule, prevascular adenopathy, pleural/extrapleural/hemidiaphragm nodules and pathologic left rib fracture. 2. Amorphous ground-glass in the superior segment right lower lobe persists from 08/21/2018 and shows abnormal hypermetabolism.  While this may be infectious or inflammatory in etiology, underlying adenocarcinoma cannot be excluded. Continued attention on follow-up exams is warranted. 3. Bilateral renal stones. 4.  Aortic atherosclerosis (ICD10-170.0). Electronically Signed: By: Lorin Picket M.D. On: 09/19/2018 08:30   Ct Biopsy  Result Date: 09/25/2018 INDICATION: 67 year old female with a new lung mass concerning for primary lung carcinoma and multiple regions of metastases. She presents today for biopsy of chest wall mass. EXAM: CT BIOPSY MEDICATIONS: None. ANESTHESIA/SEDATION: Moderate (conscious) sedation was employed during this procedure. A total of Versed 2.0 mg and Fentanyl 100 mcg was administered intravenously. Moderate Sedation Time: 10 minutes. The patient's level of consciousness and vital signs were monitored continuously by radiology nursing throughout the procedure under my direct supervision. FLUOROSCOPY TIME:  CT COMPLICATIONS: None PROCEDURE: Informed written consent was obtained from the patient after a thorough discussion of the procedural risks, benefits and alternatives. All questions were addressed. Maximal Sterile Barrier Technique was utilized including caps, mask, sterile gowns, sterile gloves, sterile drape, hand hygiene and skin antiseptic. A timeout was performed prior to the initiation of the procedure. Patient position prone position on the CT gantry. Scout CT images were acquired. The patient was then prepped and draped in the usual sterile fashion. 1% lidocaine was used for local anesthesia. Using CT guidance, guide needle was advanced into the soft tissue mass of the left lower rib, rib 10. Multiple 18 gauge core biopsy were acquired. Patient tolerated the procedure well and remained hemodynamically stable throughout. No complications were encountered and no significant blood loss. IMPRESSION: Status post CT-guided biopsy of left tenth rib soft tissue lesion. Tissue specimen sent to pathology for  complete histopathologic analysis. Signed, Dulcy Fanny. Dellia Nims, RPVI [facility] Radiology Electronically Signed   By: Corrie Mckusick D.O.   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  She presented with left upper lobe lung nodule in addition to mediastinal lymphadenopathy as well as right upper lobe as well as metastatic bone disease to the sixth and 10th rib.  The patient has significant pain from her metastatic rib lesions.   PLAN: I had a lengthy discussion with the patient and her son today about her current disease stage, prognosis and treatment options.  I personally and independently reviewed the scan images and discussed the result with the patient and her son. I explained to the patient that she has incurable condition and all the treatment will be of palliative nature. I give the patient the option of palliative care versus palliative systemic chemotherapy with a small cell regimen because of the high-grade neuroendocrine carcinoma.  The patient is interested  in proceeding with systemic therapy. She will be treated with systemic chemotherapy in the form of carboplatin for AUC of 5 on day 1, etoposide 100 mg/M2 on days 1, 2 and 3 as well as Tecentriq 1200 mg IV every 3 weeks with Neulasta support. She is expected to start the first cycle of this treatment on October 29, 2018. I discussed with the patient the adverse effect of this treatment including but not limited to alopecia, myelosuppression, nausea and vomiting, peripheral neuropathy, liver or renal dysfunction as well as the adverse effect of the immunotherapy. The patient will have a chemotherapy education class before the first dose of her treatment. For pain management she is expected to start  palliative radiotherapy to the metastatic rib lesions on October 23, 2018.  She will continue her current treatment with oxycodone and I also started the patient on Medrol Dosepak for pain relief for now. She is also scheduled for MRI of the brain on October 22, 2018 to complete the staging work-up of her disease. I will also refer the patient to interventional radiology for Port-A-Cath placement. I will call her pharmacy with prescription for Compazine 10 mg p.o. every 6 hours as needed for nausea, EMLA cream to be applied to the Port-A-Cath site before treatment in addition to the [place]. For smoking cessation, I strongly encouraged the patient to quit smoking and she will try to get nicotine patch from 1 800 quit now. The patient will come back for follow-up visit in 1 week after her treatment for evaluation and management of any adverse effect of her treatment. She was advised to call immediately if she has any concerning symptoms in the interval. The patient voices understanding of current disease status and treatment options and is in agreement with the current care plan.  All questions were answered. The patient knows to call the clinic with any problems, questions or concerns. We can certainly see the patient much sooner if necessary.  Thank you so much for allowing me to participate in the care of [name]. I will continue to follow up the patient with you and assist in her care.  I spent 55 minutes counseling the patient face to face. The total time spent in the appointment was 80 minutes.  Disclaimer: This note was dictated with voice recognition software. Similar sounding words can inadvertently be transcribed and may not be corrected upon review.   Eilleen Kempf October 18, 2018, 3:37 PM

## 2018-10-18 NOTE — Progress Notes (Signed)
START ON PATHWAY REGIMEN - Small Cell Lung     Cycles 1 through 4, every 21 days:     Atezolizumab      Carboplatin      Etoposide    Cycles 5 and beyond, every 21 days:     Atezolizumab   **Always confirm dose/schedule in your pharmacy ordering system**  Patient Characteristics: Newly Diagnosed, Preoperative or Nonsurgical Candidate (Clinical Staging), First Line, Extensive Stage Therapeutic Status: Newly Diagnosed, Preoperative or Nonsurgical Candidate (Clinical Staging) AJCC T Category: cT1c AJCC N Category: cN2 AJCC M Category: pM1c AJCC 8 Stage Grouping: IVB Stage Classification: Extensive Intent of Therapy: Non-Curative / Palliative Intent, Discussed with Patient

## 2018-10-19 ENCOUNTER — Other Ambulatory Visit: Payer: Self-pay | Admitting: *Deleted

## 2018-10-19 NOTE — Progress Notes (Signed)
Thoracic cancer conference 10/18/18 discussion documented for communication purpose.  Patient was not seen or examine at cancer conference.

## 2018-10-22 ENCOUNTER — Ambulatory Visit (HOSPITAL_COMMUNITY)
Admission: RE | Admit: 2018-10-22 | Discharge: 2018-10-22 | Disposition: A | Discharge status: Home / Self Care | Payer: Medicare Other | Source: Ambulatory Visit | Attending: Radiation Oncology | Admitting: Radiation Oncology

## 2018-10-22 DIAGNOSIS — C3490 Malignant neoplasm of unspecified part of unspecified bronchus or lung: Secondary | ICD-10-CM

## 2018-10-22 MED ORDER — GADOBUTROL 1 MMOL/ML IV SOLN
7.0000 mL | Freq: Once | INTRAVENOUS | Status: AC | PRN
  Administered 2018-10-22: 7 mL via INTRAVENOUS

## 2018-10-23 ENCOUNTER — Ambulatory Visit
Admission: RE | Admit: 2018-10-23 | Discharge: 2018-10-23 | Disposition: A | Discharge status: Home / Self Care | Payer: Medicare Other | Source: Ambulatory Visit | Attending: Radiation Oncology | Admitting: Radiation Oncology

## 2018-10-23 ENCOUNTER — Telehealth: Payer: Self-pay | Admitting: Internal Medicine

## 2018-10-23 ENCOUNTER — Inpatient Hospital Stay: Payer: Medicare Other

## 2018-10-23 DIAGNOSIS — C7951 Secondary malignant neoplasm of bone: Secondary | ICD-10-CM | POA: Diagnosis not present

## 2018-10-23 DIAGNOSIS — Z923 Personal history of irradiation: Secondary | ICD-10-CM | POA: Diagnosis not present

## 2018-10-23 NOTE — Telephone Encounter (Signed)
Added on appts per 12/5 los - pt to get an updated schedule after rad onc appt today -

## 2018-10-24 ENCOUNTER — Ambulatory Visit
Admission: RE | Admit: 2018-10-24 | Discharge: 2018-10-24 | Disposition: A | Discharge status: Home / Self Care | Payer: Medicare Other | Source: Ambulatory Visit | Attending: Radiation Oncology | Admitting: Radiation Oncology

## 2018-10-24 ENCOUNTER — Encounter: Payer: Self-pay | Admitting: Radiation Oncology

## 2018-10-24 ENCOUNTER — Other Ambulatory Visit: Payer: Self-pay | Admitting: Radiation Oncology

## 2018-10-24 DIAGNOSIS — C7951 Secondary malignant neoplasm of bone: Secondary | ICD-10-CM

## 2018-10-24 DIAGNOSIS — Z923 Personal history of irradiation: Secondary | ICD-10-CM | POA: Diagnosis not present

## 2018-10-24 MED ORDER — RADIAPLEXRX EX GEL
Freq: Once | CUTANEOUS | Status: AC
  Administered 2018-10-24: 16:00:00 via TOPICAL

## 2018-10-24 MED ORDER — IBUPROFEN 800 MG PO TABS: 800.0000 mg | ORAL_TABLET | Freq: Three times a day (TID) | ORAL | 0 refills | Status: DC | PRN

## 2018-10-24 MED ORDER — GABAPENTIN 300 MG PO CAPS: 300.0000 mg | ORAL_CAPSULE | Freq: Three times a day (TID) | ORAL | 0 refills | Status: DC

## 2018-10-24 MED ORDER — OXYCODONE HCL 5 MG PO TABS: ORAL_TABLET | ORAL | 0 refills | Status: DC

## 2018-10-24 NOTE — Progress Notes (Addendum)
Ms. Whipkey is a pleasant patient of Dr. Lanell Persons with a known history of large cell lung cancer who is currently being treated for a metastatic bone lesion in her left rib.  Based on her prior pet imaging this area was approximately 5 cm in greatest dimension.  She is undergone biopsy to confirm disease several weeks ago, and is receiving palliative radiation.  He her plan is to receive 30 Gy in 10 fractions and she is completed 2 fractions to date.  She has been on steroid medication to help alleviate her symptoms of pain in this location with minimal relief, and was restarted on another Dosepak recently.  She was also given a prescription for oxycodone 5 mg 1 to 2 tablets every 4-6 hours.  She has been using her oxycodone without significant improvement, and describes persistent dull aching and sometimes shooting pain in her left mid back radiating around towards her flank and occasionally radiating down the left chest wall.  In reviewing her recent blood work from last week, her creatinine is within normal range.  I discussed options to add ibuprofen 800 mg every 8 hours as needed, and gave her GI prophylaxis instructions.  We also discussed the utility of Neurontin off label for neuralgia, and discussed the side effect profile as well.  She is interested in trying both, I encouraged her to go home and try using ibuprofen for the next 8 hours to see if this would improve her symptoms, and then if this has not significantly helped, to consider the Neurontin.  I believe this would be helpful for her to achieve some rest as she has not been able to sleep well at all for the last week.  She will continue her radiation which ultimately should be beneficial in terms of treating her bone pain, and be followed by Dr. Isidore Moos through the remainder of her course.  She is also been using a heating pad which I encouraged her to avoid as well as avoiding hot baths.  We have given her radioplex cream to use on her skin if she  wants to try and use light massage to alleviate some of her discomfort as well.     Carola Rhine, PAC

## 2018-10-25 ENCOUNTER — Inpatient Hospital Stay: Payer: Medicare Other

## 2018-10-25 ENCOUNTER — Ambulatory Visit
Admission: RE | Admit: 2018-10-25 | Discharge: 2018-10-25 | Disposition: A | Discharge status: Home / Self Care | Payer: Medicare Other | Source: Ambulatory Visit | Attending: Radiation Oncology | Admitting: Radiation Oncology

## 2018-10-25 ENCOUNTER — Encounter: Payer: Self-pay | Admitting: Internal Medicine

## 2018-10-25 DIAGNOSIS — C7951 Secondary malignant neoplasm of bone: Secondary | ICD-10-CM | POA: Diagnosis not present

## 2018-10-25 DIAGNOSIS — Z923 Personal history of irradiation: Secondary | ICD-10-CM | POA: Diagnosis not present

## 2018-10-25 NOTE — Progress Notes (Signed)
Met with patient after chemo ed class to introduce myself as Arboriculturist and to offer available resources.  Patient has 2 insurances therefore copay assistance will not be needed.  Discussed the one-time $700 Engineer, drilling. Advised of what is needed to apply. She verbalized understanding.   She has no additional questions or concerns at this time. She has my card for any additional financial questions or concerns.

## 2018-10-26 ENCOUNTER — Ambulatory Visit
Admission: RE | Admit: 2018-10-26 | Discharge: 2018-10-26 | Disposition: A | Discharge status: Home / Self Care | Payer: Medicare Other | Source: Ambulatory Visit | Attending: Radiation Oncology | Admitting: Radiation Oncology

## 2018-10-26 ENCOUNTER — Telehealth: Payer: Self-pay | Admitting: *Deleted

## 2018-10-26 DIAGNOSIS — C7951 Secondary malignant neoplasm of bone: Secondary | ICD-10-CM | POA: Diagnosis not present

## 2018-10-26 DIAGNOSIS — Z923 Personal history of irradiation: Secondary | ICD-10-CM | POA: Diagnosis not present

## 2018-10-26 NOTE — Telephone Encounter (Signed)
Oncology Nurse Navigator Documentation  Oncology Nurse Navigator Flowsheets 10/26/2018  Navigator Location [facility]  Navigator Encounter Type Telephone/I followed up with Ms. Alison Sullivan schedule.  She is set for her 1st chemo on Monday.  I called to see if she had any questions or concerns about her upcoming chemo appt but she states she did not at this time.  I offered support and encouragement.    Telephone Outgoing Call  Abnormal Finding Date 08/21/2018  Confirmed Diagnosis Date 09/25/2018  Treatment Initiated Date 10/16/2018  Treatment Phase Treatment  Barriers/Navigation Needs Education  Education Other  Interventions Education  Education Method Verbal  Acuity Level 1  Time Spent with Patient 15

## 2018-10-29 ENCOUNTER — Other Ambulatory Visit: Payer: Self-pay | Admitting: Radiology

## 2018-10-29 ENCOUNTER — Ambulatory Visit
Admission: RE | Admit: 2018-10-29 | Discharge: 2018-10-29 | Disposition: A | Discharge status: Home / Self Care | Payer: Medicare Other | Source: Ambulatory Visit | Attending: Radiation Oncology | Admitting: Radiation Oncology

## 2018-10-29 ENCOUNTER — Inpatient Hospital Stay: Payer: Medicare Other

## 2018-10-29 VITALS — BP 116/67 | HR 72 | Temp 98.0°F | Resp 18

## 2018-10-29 DIAGNOSIS — Z5111 Encounter for antineoplastic chemotherapy: Secondary | ICD-10-CM | POA: Diagnosis not present

## 2018-10-29 DIAGNOSIS — C3492 Malignant neoplasm of unspecified part of left bronchus or lung: Secondary | ICD-10-CM

## 2018-10-29 DIAGNOSIS — R5382 Chronic fatigue, unspecified: Secondary | ICD-10-CM

## 2018-10-29 DIAGNOSIS — N189 Chronic kidney disease, unspecified: Secondary | ICD-10-CM | POA: Diagnosis not present

## 2018-10-29 DIAGNOSIS — C7951 Secondary malignant neoplasm of bone: Secondary | ICD-10-CM | POA: Diagnosis not present

## 2018-10-29 DIAGNOSIS — F17210 Nicotine dependence, cigarettes, uncomplicated: Secondary | ICD-10-CM | POA: Diagnosis not present

## 2018-10-29 DIAGNOSIS — Z5112 Encounter for antineoplastic immunotherapy: Secondary | ICD-10-CM | POA: Diagnosis not present

## 2018-10-29 DIAGNOSIS — Z923 Personal history of irradiation: Secondary | ICD-10-CM | POA: Diagnosis not present

## 2018-10-29 DIAGNOSIS — C7A1 Malignant poorly differentiated neuroendocrine tumors: Secondary | ICD-10-CM | POA: Diagnosis not present

## 2018-10-29 DIAGNOSIS — C7B09 Secondary carcinoid tumors of other sites: Secondary | ICD-10-CM | POA: Diagnosis not present

## 2018-10-29 LAB — CBC WITH DIFFERENTIAL (CANCER CENTER ONLY)
Abs Immature Granulocytes: 0.05 10*3/uL (ref 0.00–0.07)
Basophils Absolute: 0.1 10*3/uL (ref 0.0–0.1)
Basophils Relative: 1 %
EOS ABS: 0.3 10*3/uL (ref 0.0–0.5)
Eosinophils Relative: 3 %
HCT: 46.2 % — ABNORMAL HIGH (ref 36.0–46.0)
Hemoglobin: 15.5 g/dL — ABNORMAL HIGH (ref 12.0–15.0)
IMMATURE GRANULOCYTES: 1 %
Lymphocytes Relative: 20 %
Lymphs Abs: 2 10*3/uL (ref 0.7–4.0)
MCH: 30.5 pg (ref 26.0–34.0)
MCHC: 33.5 g/dL (ref 30.0–36.0)
MCV: 90.9 fL (ref 80.0–100.0)
Monocytes Absolute: 0.9 10*3/uL (ref 0.1–1.0)
Monocytes Relative: 9 %
NEUTROS PCT: 66 %
Neutro Abs: 6.5 10*3/uL (ref 1.7–7.7)
Platelet Count: 299 10*3/uL (ref 150–400)
RBC: 5.08 MIL/uL (ref 3.87–5.11)
RDW: 12.7 % (ref 11.5–15.5)
WBC Count: 9.8 10*3/uL (ref 4.0–10.5)
nRBC: 0 % (ref 0.0–0.2)

## 2018-10-29 LAB — CMP (CANCER CENTER ONLY)
ALT: 13 U/L (ref 0–44)
ANION GAP: 12 (ref 5–15)
AST: 15 U/L (ref 15–41)
Albumin: 3.4 g/dL — ABNORMAL LOW (ref 3.5–5.0)
Alkaline Phosphatase: 79 U/L (ref 38–126)
BUN: 18 mg/dL (ref 8–23)
CO2: 21 mmol/L — ABNORMAL LOW (ref 22–32)
Calcium: 9.2 mg/dL (ref 8.9–10.3)
Chloride: 107 mmol/L (ref 98–111)
Creatinine: 0.93 mg/dL (ref 0.44–1.00)
GFR, Est AFR Am: >60 mL/min (ref >60)
GFR, Estimated: >60 mL/min (ref >60)
Glucose, Bld: 121 mg/dL — ABNORMAL HIGH (ref 70–99)
Potassium: 3.5 mmol/L (ref 3.5–5.1)
Sodium: 140 mmol/L (ref 135–145)
TOTAL PROTEIN: 6.6 g/dL (ref 6.5–8.1)
Total Bilirubin: 0.4 mg/dL (ref 0.3–1.2)

## 2018-10-29 LAB — TSH: TSH: 0.995 u[IU]/mL (ref 0.308–3.960)

## 2018-10-29 MED ORDER — PALONOSETRON HCL INJECTION 0.25 MG/5ML
INTRAVENOUS | Status: AC
  Filled 2018-10-29: qty 5

## 2018-10-29 MED ORDER — SODIUM CHLORIDE 0.9 % IV SOLN
1200.0000 mg | Freq: Once | INTRAVENOUS | Status: AC
  Administered 2018-10-29: 1200 mg via INTRAVENOUS
  Filled 2018-10-29: qty 20

## 2018-10-29 MED ORDER — SODIUM CHLORIDE 0.9 % IV SOLN
Freq: Once | INTRAVENOUS | Status: AC
  Administered 2018-10-29: 14:00:00 via INTRAVENOUS
  Filled 2018-10-29: qty 250

## 2018-10-29 MED ORDER — SODIUM CHLORIDE 0.9 % IV SOLN
Freq: Once | INTRAVENOUS | Status: AC
  Administered 2018-10-29: 14:00:00 via INTRAVENOUS
  Filled 2018-10-29: qty 5

## 2018-10-29 MED ORDER — SODIUM CHLORIDE 0.9 % IV SOLN
418.0000 mg | Freq: Once | INTRAVENOUS | Status: AC
  Administered 2018-10-29: 420 mg via INTRAVENOUS
  Filled 2018-10-29: qty 42

## 2018-10-29 MED ORDER — SODIUM CHLORIDE 0.9 % IV SOLN
100.0000 mg/m2 | Freq: Once | INTRAVENOUS | Status: AC
  Administered 2018-10-29: 180 mg via INTRAVENOUS
  Filled 2018-10-29: qty 9

## 2018-10-29 MED ORDER — PALONOSETRON HCL INJECTION 0.25 MG/5ML
0.2500 mg | Freq: Once | INTRAVENOUS | Status: AC
  Administered 2018-10-29: 0.25 mg via INTRAVENOUS

## 2018-10-29 NOTE — Patient Instructions (Signed)
[facility] Discharge Instructions for Patients Receiving Chemotherapy  Today you received the following chemotherapy agents atezolizumab (Tecentriq), carboplatin (Paraplatin), etoposide (Vepesid)  To help prevent nausea and vomiting after your treatment, we encourage you to take your nausea medication as directed by your doctor.   If you develop nausea and vomiting that is not controlled by your nausea medication, call the clinic.   BELOW ARE SYMPTOMS THAT SHOULD BE REPORTED IMMEDIATELY:  *FEVER GREATER THAN 100.5 F  *CHILLS WITH OR WITHOUT FEVER  NAUSEA AND VOMITING THAT IS NOT CONTROLLED WITH YOUR NAUSEA MEDICATION  *UNUSUAL SHORTNESS OF BREATH  *UNUSUAL BRUISING OR BLEEDING  TENDERNESS IN MOUTH AND THROAT WITH OR WITHOUT PRESENCE OF ULCERS  *URINARY PROBLEMS  *BOWEL PROBLEMS  UNUSUAL RASH Items with * indicate a potential emergency and should be followed up as soon as possible.  Feel free to call the clinic should you have any questions or concerns. The clinic phone number is [phone].  Please show the [facility] at check-in to the Emergency Department and triage nurse.  Atezolizumab injection What is this medicine? ATEZOLIZUMAB (a te zoe LIZ ue mab) is a monoclonal antibody. It is used to treat bladder cancer (urothelial cancer) and non-small cell lung cancer. This medicine may be used for other purposes; ask your health care provider or pharmacist if you have questions. COMMON BRAND NAME(S): Tecentriq What should I tell my health care provider before I take this medicine? They need to know if you have any of these conditions: -diabetes -immune system problems -infection -inflammatory bowel disease -liver disease -lung or breathing disease -lupus -nervous system problems like myasthenia gravis or Guillain-Barre syndrome -organ transplant -an unusual or allergic reaction to atezolizumab, other medicines, foods, dyes, or  preservatives -pregnant or trying to get pregnant -breast-feeding How should I use this medicine? This medicine is for infusion into a vein. It is given by a health care professional in a hospital or clinic setting. A special MedGuide will be given to you before each treatment. Be sure to read this information carefully each time. Talk to your pediatrician regarding the use of this medicine in children. Special care may be needed. Overdosage: If you think you have taken too much of this medicine contact a poison control center or emergency room at once. NOTE: This medicine is only for you. Do not share this medicine with others. What if I miss a dose? It is important not to miss your dose. Call your doctor or health care professional if you are unable to keep an appointment. What may interact with this medicine? Interactions have not been studied. This list may not describe all possible interactions. Give your health care provider a list of all the medicines, herbs, non-prescription drugs, or dietary supplements you use. Also tell them if you smoke, drink alcohol, or use illegal drugs. Some items may interact with your medicine. What should I watch for while using this medicine? Your condition will be monitored carefully while you are receiving this medicine. You may need blood work done while you are taking this medicine. Do not become pregnant while taking this medicine or for at least 5 months after stopping it. Women should inform their doctor if they wish to become pregnant or think they might be pregnant. There is a potential for serious side effects to an unborn child. Talk to your health care professional or pharmacist for more information. Do not breast-feed an infant while taking this medicine or for at least  5 months after the last dose. What side effects may I notice from receiving this medicine? Side effects that you should report to your doctor or health care professional as soon as  possible: -allergic reactions like skin rash, itching or hives, swelling of the face, lips, or tongue -black, tarry stools -bloody or watery diarrhea -breathing problems -changes in vision -chest pain or chest tightness -chills -facial flushing -fever -headache -signs and symptoms of high blood sugar such as dizziness; dry mouth; dry skin; fruity breath; nausea; stomach pain; increased hunger or thirst; increased urination -signs and symptoms of liver injury like dark yellow or brown urine; general ill feeling or flu-like symptoms; light-colored stools; loss of appetite; nausea; right upper belly pain; unusually weak or tired; yellowing of the eyes or skin -stomach pain -trouble passing urine or change in the amount of urine Side effects that usually do not require medical attention (report to your doctor or health care professional if they continue or are bothersome): -cough -diarrhea -joint pain -muscle pain -muscle weakness -tiredness -weight loss This list may not describe all possible side effects. Call your doctor for medical advice about side effects. You may report side effects to FDA at 1-800-FDA-1088. Where should I keep my medicine? This drug is given in a hospital or clinic and will not be stored at home. NOTE: This sheet is a summary. It may not cover all possible information. If you have questions about this medicine, talk to your doctor, pharmacist, or health care provider.  2018 Elsevier/Gold Standard (2015-12-02 17:54:14)  Carboplatin injection What is this medicine? CARBOPLATIN (KAR boe pla tin) is a chemotherapy drug. It targets fast dividing cells, like cancer cells, and causes these cells to die. This medicine is used to treat ovarian cancer and many other cancers. This medicine may be used for other purposes; ask your health care provider or pharmacist if you have questions. COMMON BRAND NAME(S): Paraplatin What should I tell my health care provider before I  take this medicine? They need to know if you have any of these conditions: -blood disorders -hearing problems -kidney disease -recent or ongoing radiation therapy -an unusual or allergic reaction to carboplatin, cisplatin, other chemotherapy, other medicines, foods, dyes, or preservatives -pregnant or trying to get pregnant -breast-feeding How should I use this medicine? This drug is usually given as an infusion into a vein. It is administered in a hospital or clinic by a specially trained health care professional. Talk to your pediatrician regarding the use of this medicine in children. Special care may be needed. Overdosage: If you think you have taken too much of this medicine contact a poison control center or emergency room at once. NOTE: This medicine is only for you. Do not share this medicine with others. What if I miss a dose? It is important not to miss a dose. Call your doctor or health care professional if you are unable to keep an appointment. What may interact with this medicine? -medicines for seizures -medicines to increase blood counts like filgrastim, pegfilgrastim, sargramostim -some antibiotics like amikacin, gentamicin, neomycin, streptomycin, tobramycin -vaccines Talk to your doctor or health care professional before taking any of these medicines: -acetaminophen -aspirin -ibuprofen -ketoprofen -naproxen This list may not describe all possible interactions. Give your health care provider a list of all the medicines, herbs, non-prescription drugs, or dietary supplements you use. Also tell them if you smoke, drink alcohol, or use illegal drugs. Some items may interact with your medicine. What should I watch for while  using this medicine? Your condition will be monitored carefully while you are receiving this medicine. You will need important blood work done while you are taking this medicine. This drug may make you feel generally unwell. This is not uncommon, as  chemotherapy can affect healthy cells as well as cancer cells. Report any side effects. Continue your course of treatment even though you feel ill unless your doctor tells you to stop. In some cases, you may be given additional medicines to help with side effects. Follow all directions for their use. Call your doctor or health care professional for advice if you get a fever, chills or sore throat, or other symptoms of a cold or flu. Do not treat yourself. This drug decreases your body's ability to fight infections. Try to avoid being around people who are sick. This medicine may increase your risk to bruise or bleed. Call your doctor or health care professional if you notice any unusual bleeding. Be careful brushing and flossing your teeth or using a toothpick because you may get an infection or bleed more easily. If you have any dental work done, tell your dentist you are receiving this medicine. Avoid taking products that contain aspirin, acetaminophen, ibuprofen, naproxen, or ketoprofen unless instructed by your doctor. These medicines may hide a fever. Do not become pregnant while taking this medicine. Women should inform their doctor if they wish to become pregnant or think they might be pregnant. There is a potential for serious side effects to an unborn child. Talk to your health care professional or pharmacist for more information. Do not breast-feed an infant while taking this medicine. What side effects may I notice from receiving this medicine? Side effects that you should report to your doctor or health care professional as soon as possible: -allergic reactions like skin rash, itching or hives, swelling of the face, lips, or tongue -signs of infection - fever or chills, cough, sore throat, pain or difficulty passing urine -signs of decreased platelets or bleeding - bruising, pinpoint red spots on the skin, black, tarry stools, nosebleeds -signs of decreased red blood cells - unusually weak or  tired, fainting spells, lightheadedness -breathing problems -changes in hearing -changes in vision -chest pain -high blood pressure -low blood counts - This drug may decrease the number of white blood cells, red blood cells and platelets. You may be at increased risk for infections and bleeding. -nausea and vomiting -pain, swelling, redness or irritation at the injection site -pain, tingling, numbness in the hands or feet -problems with balance, talking, walking -trouble passing urine or change in the amount of urine Side effects that usually do not require medical attention (report to your doctor or health care professional if they continue or are bothersome): -hair loss -loss of appetite -metallic taste in the mouth or changes in taste This list may not describe all possible side effects. Call your doctor for medical advice about side effects. You may report side effects to FDA at 1-800-FDA-1088. Where should I keep my medicine? This drug is given in a hospital or clinic and will not be stored at home. NOTE: This sheet is a summary. It may not cover all possible information. If you have questions about this medicine, talk to your doctor, pharmacist, or health care provider.  2018 Elsevier/Gold Standard (2008-02-05 14:38:05)  Etoposide, VP-16 injection What is this medicine? ETOPOSIDE, VP-16 (e toe POE side) is a chemotherapy drug. It is used to treat testicular cancer, lung cancer, and other cancers. This medicine may  be used for other purposes; ask your health care provider or pharmacist if you have questions. COMMON BRAND NAME(S): Etopophos, Toposar, VePesid What should I tell my health care provider before I take this medicine? They need to know if you have any of these conditions: -infection -kidney disease -liver disease -low blood counts, like low white cell, platelet, or red cell counts -an unusual or allergic reaction to etoposide, other medicines, foods, dyes, or  preservatives -pregnant or trying to get pregnant -breast-feeding How should I use this medicine? This medicine is for infusion into a vein. It is administered in a hospital or clinic by a specially trained health care professional. Talk to your pediatrician regarding the use of this medicine in children. Special care may be needed. Overdosage: If you think you have taken too much of this medicine contact a poison control center or emergency room at once. NOTE: This medicine is only for you. Do not share this medicine with others. What if I miss a dose? It is important not to miss your dose. Call your doctor or health care professional if you are unable to keep an appointment. What may interact with this medicine? -aspirin -certain medications for seizures like carbamazepine, phenobarbital, phenytoin, valproic acid -cyclosporine -levamisole -warfarin This list may not describe all possible interactions. Give your health care provider a list of all the medicines, herbs, non-prescription drugs, or dietary supplements you use. Also tell them if you smoke, drink alcohol, or use illegal drugs. Some items may interact with your medicine. What should I watch for while using this medicine? Visit your doctor for checks on your progress. This drug may make you feel generally unwell. This is not uncommon, as chemotherapy can affect healthy cells as well as cancer cells. Report any side effects. Continue your course of treatment even though you feel ill unless your doctor tells you to stop. In some cases, you may be given additional medicines to help with side effects. Follow all directions for their use. Call your doctor or health care professional for advice if you get a fever, chills or sore throat, or other symptoms of a cold or flu. Do not treat yourself. This drug decreases your body's ability to fight infections. Try to avoid being around people who are sick. This medicine may increase your risk to  bruise or bleed. Call your doctor or health care professional if you notice any unusual bleeding. Talk to your doctor about your risk of cancer. You may be more at risk for certain types of cancers if you take this medicine. Do not become pregnant while taking this medicine or for at least 6 months after stopping it. Women should inform their doctor if they wish to become pregnant or think they might be pregnant. Women of child-bearing potential will need to have a negative pregnancy test before starting this medicine. There is a potential for serious side effects to an unborn child. Talk to your health care professional or pharmacist for more information. Do not breast-feed an infant while taking this medicine. Men must use a latex condom during sexual contact with a woman while taking this medicine and for at least 4 months after stopping it. A latex condom is needed even if you have had a vasectomy. Contact your doctor right away if your partner becomes pregnant. Do not donate sperm while taking this medicine and for at least 4 months after you stop taking this medicine. Men should inform their doctors if they wish to father a child.  This medicine may lower sperm counts. What side effects may I notice from receiving this medicine? Side effects that you should report to your doctor or health care professional as soon as possible: -allergic reactions like skin rash, itching or hives, swelling of the face, lips, or tongue -low blood counts - this medicine may decrease the number of white blood cells, red blood cells and platelets. You may be at increased risk for infections and bleeding. -signs of infection - fever or chills, cough, sore throat, pain or difficulty passing urine -signs of decreased platelets or bleeding - bruising, pinpoint red spots on the skin, black, tarry stools, blood in the urine -signs of decreased red blood cells - unusually weak or tired, fainting spells,  lightheadedness -breathing problems -changes in vision -mouth or throat sores or ulcers -pain, redness, swelling or irritation at the injection site -pain, tingling, numbness in the hands or feet -redness, blistering, peeling or loosening of the skin, including inside the mouth -seizures -vomiting Side effects that usually do not require medical attention (report to your doctor or health care professional if they continue or are bothersome): -diarrhea -hair loss -loss of appetite -nausea -stomach pain This list may not describe all possible side effects. Call your doctor for medical advice about side effects. You may report side effects to FDA at 1-800-FDA-1088. Where should I keep my medicine? This drug is given in a hospital or clinic and will not be stored at home. NOTE: This sheet is a summary. It may not cover all possible information. If you have questions about this medicine, talk to your doctor, pharmacist, or health care provider.  2018 Elsevier/Gold Standard (2015-10-23 11:53:23)

## 2018-10-30 ENCOUNTER — Inpatient Hospital Stay: Payer: Medicare Other

## 2018-10-30 ENCOUNTER — Ambulatory Visit
Admission: RE | Admit: 2018-10-30 | Discharge: 2018-10-30 | Disposition: A | Discharge status: Home / Self Care | Payer: Medicare Other | Source: Ambulatory Visit | Attending: Radiation Oncology | Admitting: Radiation Oncology

## 2018-10-30 ENCOUNTER — Other Ambulatory Visit: Payer: Self-pay | Admitting: Radiology

## 2018-10-30 VITALS — BP 117/67 | HR 54 | Temp 98.6°F | Resp 16

## 2018-10-30 DIAGNOSIS — C7951 Secondary malignant neoplasm of bone: Secondary | ICD-10-CM | POA: Diagnosis not present

## 2018-10-30 DIAGNOSIS — Z923 Personal history of irradiation: Secondary | ICD-10-CM | POA: Diagnosis not present

## 2018-10-30 DIAGNOSIS — C7B09 Secondary carcinoid tumors of other sites: Secondary | ICD-10-CM | POA: Diagnosis not present

## 2018-10-30 DIAGNOSIS — Z5112 Encounter for antineoplastic immunotherapy: Secondary | ICD-10-CM | POA: Diagnosis not present

## 2018-10-30 DIAGNOSIS — F17210 Nicotine dependence, cigarettes, uncomplicated: Secondary | ICD-10-CM | POA: Diagnosis not present

## 2018-10-30 DIAGNOSIS — C3492 Malignant neoplasm of unspecified part of left bronchus or lung: Secondary | ICD-10-CM

## 2018-10-30 DIAGNOSIS — Z5111 Encounter for antineoplastic chemotherapy: Secondary | ICD-10-CM | POA: Diagnosis not present

## 2018-10-30 DIAGNOSIS — C7A1 Malignant poorly differentiated neuroendocrine tumors: Secondary | ICD-10-CM | POA: Diagnosis not present

## 2018-10-30 DIAGNOSIS — N189 Chronic kidney disease, unspecified: Secondary | ICD-10-CM | POA: Diagnosis not present

## 2018-10-30 MED ORDER — SODIUM CHLORIDE 0.9 % IV SOLN: 10.0000 mg | Freq: Once | INTRAVENOUS | Status: DC

## 2018-10-30 MED ORDER — SODIUM CHLORIDE 0.9 % IV SOLN
Freq: Once | INTRAVENOUS | Status: AC
  Administered 2018-10-30: 13:00:00 via INTRAVENOUS
  Filled 2018-10-30: qty 250

## 2018-10-30 MED ORDER — DEXAMETHASONE SODIUM PHOSPHATE 10 MG/ML IJ SOLN
10.0000 mg | Freq: Once | INTRAMUSCULAR | Status: AC
  Administered 2018-10-30: 10 mg via INTRAVENOUS

## 2018-10-30 MED ORDER — SODIUM CHLORIDE 0.9 % IV SOLN
100.0000 mg/m2 | Freq: Once | INTRAVENOUS | Status: AC
  Administered 2018-10-30: 180 mg via INTRAVENOUS
  Filled 2018-10-30: qty 9

## 2018-10-30 MED ORDER — DEXAMETHASONE SODIUM PHOSPHATE 10 MG/ML IJ SOLN
INTRAMUSCULAR | Status: AC
  Filled 2018-10-30: qty 1

## 2018-10-30 NOTE — Patient Instructions (Signed)
[facility] Discharge Instructions for Patients Receiving Chemotherapy  Today you received the following chemotherapy agents: Etoposide  To help prevent nausea and vomiting after your treatment, we encourage you to take your nausea medication as directed. If you develop nausea and vomiting that is not controlled by your nausea medication, call the clinic.   BELOW ARE SYMPTOMS THAT SHOULD BE REPORTED IMMEDIATELY:  *FEVER GREATER THAN 100.5 F  *CHILLS WITH OR WITHOUT FEVER  NAUSEA AND VOMITING THAT IS NOT CONTROLLED WITH YOUR NAUSEA MEDICATION  *UNUSUAL SHORTNESS OF BREATH  *UNUSUAL BRUISING OR BLEEDING  TENDERNESS IN MOUTH AND THROAT WITH OR WITHOUT PRESENCE OF ULCERS  *URINARY PROBLEMS  *BOWEL PROBLEMS  UNUSUAL RASH Items with * indicate a potential emergency and should be followed up as soon as possible.  Feel free to call the clinic should you have any questions or concerns. The clinic phone number is [phone].  Please show the [facility] at check-in to the Emergency Department and triage nurse.

## 2018-10-31 ENCOUNTER — Encounter (HOSPITAL_COMMUNITY): Payer: Self-pay

## 2018-10-31 ENCOUNTER — Inpatient Hospital Stay: Payer: Medicare Other

## 2018-10-31 ENCOUNTER — Ambulatory Visit (HOSPITAL_COMMUNITY)
Admission: RE | Admit: 2018-10-31 | Discharge: 2018-10-31 | Disposition: A | Discharge status: Home / Self Care | Payer: Medicare Other | Source: Ambulatory Visit | Attending: Internal Medicine | Admitting: Internal Medicine

## 2018-10-31 ENCOUNTER — Other Ambulatory Visit: Payer: Self-pay | Admitting: Internal Medicine

## 2018-10-31 ENCOUNTER — Ambulatory Visit
Admission: RE | Admit: 2018-10-31 | Discharge: 2018-10-31 | Disposition: A | Discharge status: Home / Self Care | Payer: Medicare Other | Source: Ambulatory Visit | Attending: Radiation Oncology | Admitting: Radiation Oncology

## 2018-10-31 VITALS — BP 108/65 | HR 55 | Temp 97.6°F | Resp 18 | Wt 154.5 lb

## 2018-10-31 DIAGNOSIS — M8580 Other specified disorders of bone density and structure, unspecified site: Secondary | ICD-10-CM | POA: Insufficient documentation

## 2018-10-31 DIAGNOSIS — Z79899 Other long term (current) drug therapy: Secondary | ICD-10-CM | POA: Diagnosis not present

## 2018-10-31 DIAGNOSIS — C3492 Malignant neoplasm of unspecified part of left bronchus or lung: Secondary | ICD-10-CM | POA: Insufficient documentation

## 2018-10-31 DIAGNOSIS — C7B09 Secondary carcinoid tumors of other sites: Secondary | ICD-10-CM | POA: Diagnosis not present

## 2018-10-31 DIAGNOSIS — C3490 Malignant neoplasm of unspecified part of unspecified bronchus or lung: Secondary | ICD-10-CM | POA: Diagnosis not present

## 2018-10-31 DIAGNOSIS — N189 Chronic kidney disease, unspecified: Secondary | ICD-10-CM | POA: Diagnosis not present

## 2018-10-31 DIAGNOSIS — M1990 Unspecified osteoarthritis, unspecified site: Secondary | ICD-10-CM | POA: Insufficient documentation

## 2018-10-31 DIAGNOSIS — F17210 Nicotine dependence, cigarettes, uncomplicated: Secondary | ICD-10-CM | POA: Diagnosis not present

## 2018-10-31 DIAGNOSIS — Z87891 Personal history of nicotine dependence: Secondary | ICD-10-CM | POA: Insufficient documentation

## 2018-10-31 DIAGNOSIS — Z5112 Encounter for antineoplastic immunotherapy: Secondary | ICD-10-CM | POA: Diagnosis not present

## 2018-10-31 DIAGNOSIS — Z5111 Encounter for antineoplastic chemotherapy: Secondary | ICD-10-CM | POA: Diagnosis not present

## 2018-10-31 DIAGNOSIS — C7951 Secondary malignant neoplasm of bone: Secondary | ICD-10-CM | POA: Diagnosis not present

## 2018-10-31 DIAGNOSIS — C7A1 Malignant poorly differentiated neuroendocrine tumors: Secondary | ICD-10-CM | POA: Diagnosis not present

## 2018-10-31 DIAGNOSIS — Z923 Personal history of irradiation: Secondary | ICD-10-CM | POA: Diagnosis not present

## 2018-10-31 HISTORY — PX: IR IMAGING GUIDED PORT INSERTION: IMG5740

## 2018-10-31 LAB — CBC WITH DIFFERENTIAL/PLATELET
Abs Immature Granulocytes: 0.09 10*3/uL — ABNORMAL HIGH (ref 0.00–0.07)
Basophils Absolute: 0 10*3/uL (ref 0.0–0.1)
Basophils Relative: 0 %
Eosinophils Absolute: 0 10*3/uL (ref 0.0–0.5)
Eosinophils Relative: 0 %
HCT: 42.5 % (ref 36.0–46.0)
HEMOGLOBIN: 13.8 g/dL (ref 12.0–15.0)
Immature Granulocytes: 1 %
LYMPHS PCT: 5 %
Lymphs Abs: 0.7 10*3/uL (ref 0.7–4.0)
MCH: 31 pg (ref 26.0–34.0)
MCHC: 32.5 g/dL (ref 30.0–36.0)
MCV: 95.5 fL (ref 80.0–100.0)
MONO ABS: 0.5 10*3/uL (ref 0.1–1.0)
Monocytes Relative: 4 %
Neutro Abs: 11.6 10*3/uL — ABNORMAL HIGH (ref 1.7–7.7)
Neutrophils Relative %: 90 %
Platelets: 299 10*3/uL (ref 150–400)
RBC: 4.45 MIL/uL (ref 3.87–5.11)
RDW: 12.9 % (ref 11.5–15.5)
WBC: 12.9 10*3/uL — ABNORMAL HIGH (ref 4.0–10.5)
nRBC: 0 % (ref 0.0–0.2)

## 2018-10-31 LAB — PROTIME-INR
INR: 0.88
Prothrombin Time: 11.9 seconds (ref 11.4–15.2)

## 2018-10-31 MED ORDER — SODIUM CHLORIDE 0.9 % IV SOLN
Freq: Once | INTRAVENOUS | Status: AC
  Administered 2018-10-31: 14:00:00 via INTRAVENOUS
  Filled 2018-10-31: qty 250

## 2018-10-31 MED ORDER — SODIUM CHLORIDE 0.9 % IV SOLN
100.0000 mg/m2 | Freq: Once | INTRAVENOUS | Status: AC
  Administered 2018-10-31: 180 mg via INTRAVENOUS
  Filled 2018-10-31: qty 9

## 2018-10-31 MED ORDER — CEFAZOLIN SODIUM-DEXTROSE 2-4 GM/100ML-% IV SOLN
2.0000 g | INTRAVENOUS | Status: AC
  Administered 2018-10-31: 2 g via INTRAVENOUS

## 2018-10-31 MED ORDER — SODIUM CHLORIDE 0.9 % IV SOLN
INTRAVENOUS | Status: DC
  Administered 2018-10-31: 09:00:00 via INTRAVENOUS

## 2018-10-31 MED ORDER — CEFAZOLIN SODIUM-DEXTROSE 2-4 GM/100ML-% IV SOLN
INTRAVENOUS | Status: AC
  Administered 2018-10-31: 2 g via INTRAVENOUS
  Filled 2018-10-31: qty 100

## 2018-10-31 MED ORDER — HEPARIN SOD (PORK) LOCK FLUSH 100 UNIT/ML IV SOLN
500.0000 [IU] | Freq: Once | INTRAVENOUS | Status: AC | PRN
  Administered 2018-10-31: 500 [IU]
  Filled 2018-10-31: qty 5

## 2018-10-31 MED ORDER — DEXAMETHASONE SODIUM PHOSPHATE 10 MG/ML IJ SOLN
10.0000 mg | Freq: Once | INTRAMUSCULAR | Status: AC
  Administered 2018-10-31: 10 mg via INTRAVENOUS

## 2018-10-31 MED ORDER — LIDOCAINE HCL (PF) 1 % IJ SOLN
INTRAMUSCULAR | Status: AC | PRN
  Administered 2018-10-31: 10 mL via SUBCUTANEOUS
  Administered 2018-10-31: 10 mL

## 2018-10-31 MED ORDER — MIDAZOLAM HCL 2 MG/2ML IJ SOLN
INTRAMUSCULAR | Status: AC | PRN
  Administered 2018-10-31 (×2): 1 mg via INTRAVENOUS

## 2018-10-31 MED ORDER — MIDAZOLAM HCL 2 MG/2ML IJ SOLN
INTRAMUSCULAR | Status: AC
  Filled 2018-10-31: qty 2

## 2018-10-31 MED ORDER — FENTANYL CITRATE (PF) 100 MCG/2ML IJ SOLN
INTRAMUSCULAR | Status: AC
  Filled 2018-10-31: qty 2

## 2018-10-31 MED ORDER — FENTANYL CITRATE (PF) 100 MCG/2ML IJ SOLN
INTRAMUSCULAR | Status: AC | PRN
  Administered 2018-10-31: 50 ug via INTRAVENOUS
  Administered 2018-10-31: 25 ug via INTRAVENOUS

## 2018-10-31 MED ORDER — LIDOCAINE HCL 1 % IJ SOLN
INTRAMUSCULAR | Status: AC
  Filled 2018-10-31: qty 20

## 2018-10-31 MED ORDER — DEXAMETHASONE SODIUM PHOSPHATE 10 MG/ML IJ SOLN
INTRAMUSCULAR | Status: AC
  Filled 2018-10-31: qty 1

## 2018-10-31 MED ORDER — SODIUM CHLORIDE 0.9% FLUSH
10.0000 mL | INTRAVENOUS | Status: DC | PRN
  Administered 2018-10-31: 10 mL
  Filled 2018-10-31: qty 10

## 2018-10-31 NOTE — Discharge Instructions (Signed)
Implanted Port Home Guide °An implanted port is a device that is placed under the skin. It is usually placed in the chest. The device can be used to give IV medicine, to take blood, or for dialysis. You may have an implanted port if: °· You need IV medicine that would be irritating to the small veins in your hands or arms. °· You need IV medicines, such as antibiotics, for a long period of time. °· You need IV nutrition for a long period of time. °· You need dialysis. °Having a port means that your health care provider will not need to use the veins in your arms for these procedures. You may have fewer limitations when using a port than you would if you used other types of long-term IVs, and you will likely be able to return to normal activities after your incision heals. °An implanted port has two main parts: °· Reservoir. The reservoir is the part where a needle is inserted to give medicines or draw blood. The reservoir is round. After it is placed, it appears as a small, raised area under your skin. °· Catheter. The catheter is a thin, flexible tube that connects the reservoir to a vein. Medicine that is inserted into the reservoir goes into the catheter and then into the vein. °How is my port accessed? °To access your port: °· A numbing cream may be placed on the skin over the port site. °· Your health care provider will put on a mask and sterile gloves. °· The skin over your port will be cleaned carefully with a germ-killing soap and allowed to dry. °· Your health care provider will gently pinch the port and insert a needle into it. °· Your health care provider will check for a blood return to make sure the port is in the vein and is not clogged. °· If your port needs to remain accessed to get medicine continuously (constant infusion), your health care provider will place a clear bandage (dressing) over the needle site. The dressing and needle will need to be changed every week, or as told by your health care  provider. °What is flushing? °Flushing helps keep the port from getting clogged. Follow instructions from your health care provider about how and when to flush the port. Ports are usually flushed with saline solution or a medicine called heparin. The need for flushing will depend on how the port is used: °· If the port is only used from time to time to give medicines or draw blood, the port may need to be flushed: °? Before and after medicines have been given. °? Before and after blood has been drawn. °? As part of routine maintenance. Flushing may be recommended every 4-6 weeks. °· If a constant infusion is running, the port may not need to be flushed. °· Throw away any syringes in a disposal container that is meant for sharp items (sharps container). You can buy a sharps container from a pharmacy, or you can make one by using an empty hard plastic bottle with a cover. °How long will my port stay implanted? °The port can stay in for as long as your health care provider thinks it is needed. When it is time for the port to come out, a surgery will be done to remove it. The surgery will be similar to the procedure that was done to put the port in. °Follow these instructions at home: ° °· Flush your port as told by your health care provider. °·   If you need an infusion over several days, follow instructions from your health care provider about how to take care of your port site. Make sure you: ? Wash your hands with soap and water before you change your dressing. If soap and water are not available, use alcohol-based hand sanitizer. ? Change your dressing as told by your health care provider. ? Place any used dressings or infusion bags into a plastic bag. Throw that bag in the trash. ? Keep the dressing that covers the needle clean and dry. Do not get it wet. ? Do not use scissors or sharp objects near the tube. ? Keep the tube clamped, unless it is being used.  Check your port site every day for signs of  infection. Check for: ? Redness, swelling, or pain. ? Fluid or blood. ? Pus or a bad smell.  Protect the skin around the port site. ? Avoid wearing bra straps that rub or irritate the site. ? Protect the skin around your port from seat belts. Place a soft pad over your chest if needed.  Bathe or shower as told by your health care provider. The site may get wet as long as you are not actively receiving an infusion.  Return to your normal activities as told by your health care provider. Ask your health care provider what activities are safe for you.  Carry a medical alert card or wear a medical alert bracelet at all times. This will let health care providers know that you have an implanted port in case of an emergency. Get help right away if:  You have redness, swelling, or pain at the port site.  You have fluid or blood coming from your port site.  You have pus or a bad smell coming from the port site.  You have a fever. Summary  Implanted ports are usually placed in the chest for long-term IV access.  Follow instructions from your health care provider about flushing the port and changing bandages (dressings).  Take care of the area around your port by avoiding clothing that puts pressure on the area, and by watching for signs of infection.  Protect the skin around your port from seat belts. Place a soft pad over your chest if needed.  Get help right away if you have a fever or you have redness, swelling, pain, drainage, or a bad smell at the port site. This information is not intended to replace advice given to you by your health care provider. Make sure you discuss any questions you have with your health care provider. Document Released: 10/31/2005 Document Revised: 12/03/2016 Document Reviewed: 12/03/2016 Elsevier Interactive Patient Education  2019 [place]. Moderate Conscious Sedation, Adult, Care After These instructions provide you with information about caring for  yourself after your procedure. Your health care provider may also give you more specific instructions. Your treatment has been planned according to current medical practices, but problems sometimes occur. Call your health care provider if you have any problems or questions after your procedure. What can I expect after the procedure? After your procedure, it is common:  To feel sleepy for several hours.  To feel clumsy and have poor balance for several hours.  To have poor judgment for several hours.  To vomit if you eat too soon. Follow these instructions at home: For at least 24 hours after the procedure:   Do not: ? Participate in activities where you could fall or become injured. ? Drive. ? Use heavy machinery. ? Drink  alcohol. ? Take sleeping pills or medicines that cause drowsiness. ? Make important decisions or sign legal documents. ? Take care of children on your own.  Rest. Eating and drinking  Follow the diet recommended by your health care provider.  If you vomit: ? Drink water, juice, or soup when you can drink without vomiting. ? Make sure you have little or no nausea before eating solid foods. General instructions  Have a responsible adult stay with you until you are awake and alert.  Take over-the-counter and prescription medicines only as told by your health care provider.  If you smoke, do not smoke without supervision.  Keep all follow-up visits as told by your health care provider. This is important. Contact a health care provider if:  You keep feeling nauseous or you keep vomiting.  You feel light-headed.  You develop a rash.  You have a fever. Get help right away if:  You have trouble breathing. This information is not intended to replace advice given to you by your health care provider. Make sure you discuss any questions you have with your health care provider. Document Released: 08/21/2013 Document Revised: 04/04/2016 Document Reviewed:  02/20/2016 Elsevier Interactive Patient Education  2019 [employer].

## 2018-10-31 NOTE — Patient Instructions (Signed)
[facility] Discharge Instructions for Patients Receiving Chemotherapy  Today you received the following chemotherapy agents: Etoposide (VEPESID).  To help prevent nausea and vomiting after your treatment, we encourage you to take your nausea medication as prescribed.   If you develop nausea and vomiting that is not controlled by your nausea medication, call the clinic.   BELOW ARE SYMPTOMS THAT SHOULD BE REPORTED IMMEDIATELY:  *FEVER GREATER THAN 100.5 F  *CHILLS WITH OR WITHOUT FEVER  NAUSEA AND VOMITING THAT IS NOT CONTROLLED WITH YOUR NAUSEA MEDICATION  *UNUSUAL SHORTNESS OF BREATH  *UNUSUAL BRUISING OR BLEEDING  TENDERNESS IN MOUTH AND THROAT WITH OR WITHOUT PRESENCE OF ULCERS  *URINARY PROBLEMS  *BOWEL PROBLEMS  UNUSUAL RASH Items with * indicate a potential emergency and should be followed up as soon as possible.  Feel free to call the clinic should you have any questions or concerns. The clinic phone number is [phone].  Please show the CHEMO ALERT CARD at check-in to the Emergency Department and triage nurse.   

## 2018-10-31 NOTE — Procedures (Signed)
RIJV PAC SVC RA EBL 0 Comp 0 

## 2018-10-31 NOTE — H&P (Signed)
Referring Physician(s): Mohamed,Mohamed  Supervising Physician: Marybelle Killings  Patient Status:  [facility]  Chief Complaint:  "I'm getting a port a cath"  Subjective: Patient familiar to IR service from prior left 10th rib soft tissue lesion biopsy on 09/25/2018.  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  She currently denies fever, headache, dyspnea, cough, nausea, vomiting or bleeding.  She does have occasional left lateral chest/abdominal/back discomfort.  She recently stopped smoking.  Past Medical History:  Diagnosis Date  . Arthritis   . Chronic kidney disease   . Dog bite of wrist   . History of kidney stones   . Kidney infection   . Left renal mass   . Osteopenia   . Pyelonephritis   . Recurrent UTI (urinary tract infection)       Allergies: Patient has no known allergies.  Medications: Prior to Admission medications   Medication Sig Start Date End Date Taking? Authorizing Provider  gabapentin (NEURONTIN) 300 MG capsule Take 1 capsule (300 mg total) by mouth 3 (three) times daily. 10/24/18  Yes Hayden Pedro, PA-C  ibuprofen (ADVIL,MOTRIN) 800 MG tablet Take 1 tablet (800 mg total) by mouth every 8 (eight) hours as needed. 10/24/18  Yes Hayden Pedro, PA-C  Melatonin 10 MG TABS Take by mouth at bedtime.   Yes [provider]  methylPREDNISolone (MEDROL DOSEPAK) 4 MG TBPK tablet Use as instructed 10/18/18  Yes Curt Bears, MD  nicotine polacrilex (NICORETTE) 2 MG gum Take 2 mg by mouth as needed for smoking cessation.   Yes [provider]  oxyCODONE (OXY IR/ROXICODONE) 5 MG immediate release tablet 1-2 every 4 hours if needed for pain 10/24/18  Yes Hayden Pedro, PA-C  prochlorperazine (COMPAZINE) 10 MG tablet Take 1 tablet (10 mg total) by mouth every 6 (six) hours as needed for  nausea or vomiting. 10/18/18  Yes Curt Bears, MD  simvastatin (ZOCOR) 20 MG tablet TAKE 1 TABLET(20 MG) BY MOUTH AT BEDTIME 01/25/18  Yes [name], MD  lidocaine-prilocaine (EMLA) cream Apply 1 application topically as needed. 10/18/18   Curt Bears, MD     Vital Signs: Ht 5\' 6"  (1.676 m)   Wt 148 lb (67.1 kg)   BMI 23.89 kg/m  Pressure 130/67, heart rate 70, respirations 16, O2 sat 97% room air, temperature 98.6  Physical Exam awake, alert.  Chest clear to auscultation bilaterally.  Heart with regular rate and rhythm.  Abdomen soft, positive bowel sounds, some mild upper abdominal/left lateral chest tenderness to palpation; no significant lower extremity edema.  Imaging: No results found.  Labs:  CBC: Recent Labs    09/25/18 0730 10/18/18 1523 10/29/18 1237 10/31/18 0825  WBC 9.7 10.7* 9.8 12.9*  HGB 16.3* 16.2* 15.5* 13.8  HCT 49.8* 48.0* 46.2* 42.5  PLT 342 362 299 299    COAGS: Recent Labs    09/25/18 0730  INR 0.90    BMP: Recent Labs    08/13/18 1257 09/25/18 0730 10/18/18 1523 10/29/18 1237  NA 140 142 142 140  K 3.7 3.7 3.5 3.5  CL 104 109 108 107  CO2 27 23 21* 21*  GLUCOSE 111* 125* 128* 121*  BUN 20 17 13 18   CALCIUM 9.3 9.0 9.4 9.2  CREATININE 0.79 0.79 0.86 0.93  GFRNONAA  --  >60 >60 >60  GFRAA  --  >60 >60 >60  LIVER FUNCTION TESTS: Recent Labs    01/08/18 1050 10/18/18 1523 10/29/18 1237  BILITOT 0.7 0.6 0.4  AST 18 22 15   ALT 15 16 13   ALKPHOS 104 94 79  PROT 7.5 7.3 6.6  ALBUMIN 3.8 3.7 3.4*    Assessment and Plan:  Pt with history of 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 WBC today 12.9; patient recently completed Medrol Dosepak.  Dr. Earlie Server aware of lab results and recommends proceeding with port placement.  Patient denies any recent infections and she is afebrile.Risks and benefits of  image guided port-a-catheter placement was discussed with the patient/son including, but not limited to bleeding, infection, pneumothorax, or fibrin sheath development and need for additional procedures.  All of the patient's questions were answered, patient is agreeable to proceed. Consent signed and in chart.  Patient due to receive chemotherapy today and would like to keep Port-A-Cath accessed.   Electronically Signed: D. Rowe Robert, PA-C 10/31/2018, 8:46 AM   I spent a total of 25 minutes at the the patient's bedside AND on the patient's hospital floor or unit, greater than 50% of which was counseling/coordinating care for Port-A-Cath placement

## 2018-11-01 ENCOUNTER — Ambulatory Visit
Admission: RE | Admit: 2018-11-01 | Discharge: 2018-11-01 | Disposition: A | Discharge status: Home / Self Care | Payer: Medicare Other | Source: Ambulatory Visit | Attending: Radiation Oncology | Admitting: Radiation Oncology

## 2018-11-01 DIAGNOSIS — C7951 Secondary malignant neoplasm of bone: Secondary | ICD-10-CM | POA: Diagnosis not present

## 2018-11-01 DIAGNOSIS — Z923 Personal history of irradiation: Secondary | ICD-10-CM | POA: Diagnosis not present

## 2018-11-02 ENCOUNTER — Inpatient Hospital Stay: Payer: Medicare Other

## 2018-11-02 ENCOUNTER — Ambulatory Visit: Payer: Medicare Other

## 2018-11-05 ENCOUNTER — Inpatient Hospital Stay (HOSPITAL_BASED_OUTPATIENT_CLINIC_OR_DEPARTMENT_OTHER): Payer: Medicare Other | Admitting: Internal Medicine

## 2018-11-05 ENCOUNTER — Other Ambulatory Visit: Payer: Self-pay | Admitting: Radiation Oncology

## 2018-11-05 ENCOUNTER — Ambulatory Visit: Payer: Medicare Other

## 2018-11-05 ENCOUNTER — Inpatient Hospital Stay: Payer: Medicare Other

## 2018-11-05 ENCOUNTER — Encounter: Payer: Self-pay | Admitting: Internal Medicine

## 2018-11-05 ENCOUNTER — Ambulatory Visit
Admission: RE | Admit: 2018-11-05 | Discharge: 2018-11-05 | Disposition: A | Discharge status: Home / Self Care | Payer: Medicare Other | Source: Ambulatory Visit | Attending: Radiation Oncology | Admitting: Radiation Oncology

## 2018-11-05 ENCOUNTER — Telehealth: Payer: Self-pay | Admitting: Internal Medicine

## 2018-11-05 VITALS — BP 121/77 | HR 91 | Temp 98.2°F | Resp 18 | Ht 66.0 in | Wt 149.8 lb

## 2018-11-05 DIAGNOSIS — F17210 Nicotine dependence, cigarettes, uncomplicated: Secondary | ICD-10-CM | POA: Diagnosis not present

## 2018-11-05 DIAGNOSIS — C7951 Secondary malignant neoplasm of bone: Secondary | ICD-10-CM

## 2018-11-05 DIAGNOSIS — C7B09 Secondary carcinoid tumors of other sites: Secondary | ICD-10-CM

## 2018-11-05 DIAGNOSIS — N189 Chronic kidney disease, unspecified: Secondary | ICD-10-CM | POA: Diagnosis not present

## 2018-11-05 DIAGNOSIS — C3492 Malignant neoplasm of unspecified part of left bronchus or lung: Secondary | ICD-10-CM

## 2018-11-05 DIAGNOSIS — C7A1 Malignant poorly differentiated neuroendocrine tumors: Secondary | ICD-10-CM

## 2018-11-05 DIAGNOSIS — Z5111 Encounter for antineoplastic chemotherapy: Secondary | ICD-10-CM | POA: Diagnosis not present

## 2018-11-05 DIAGNOSIS — Z5112 Encounter for antineoplastic immunotherapy: Secondary | ICD-10-CM

## 2018-11-05 DIAGNOSIS — M8589 Other specified disorders of bone density and structure, multiple sites: Secondary | ICD-10-CM

## 2018-11-05 DIAGNOSIS — Z923 Personal history of irradiation: Secondary | ICD-10-CM | POA: Diagnosis not present

## 2018-11-05 LAB — CMP (CANCER CENTER ONLY)
ALK PHOS: 72 U/L (ref 38–126)
ALT: 13 U/L (ref 0–44)
AST: 15 U/L (ref 15–41)
Albumin: 3.2 g/dL — ABNORMAL LOW (ref 3.5–5.0)
Anion gap: 10 (ref 5–15)
BUN: 20 mg/dL (ref 8–23)
CALCIUM: 8.7 mg/dL — AB (ref 8.9–10.3)
CO2: 25 mmol/L (ref 22–32)
Chloride: 103 mmol/L (ref 98–111)
Creatinine: 0.79 mg/dL (ref 0.44–1.00)
GFR, Est AFR Am: >60 mL/min (ref >60)
GFR, Estimated: >60 mL/min (ref >60)
Glucose, Bld: 121 mg/dL — ABNORMAL HIGH (ref 70–99)
Potassium: 3.7 mmol/L (ref 3.5–5.1)
Sodium: 138 mmol/L (ref 135–145)
Total Bilirubin: 0.4 mg/dL (ref 0.3–1.2)
Total Protein: 6.5 g/dL (ref 6.5–8.1)

## 2018-11-05 LAB — CBC WITH DIFFERENTIAL (CANCER CENTER ONLY)
Abs Immature Granulocytes: 0 10*3/uL (ref 0.00–0.07)
Basophils Absolute: 0 10*3/uL (ref 0.0–0.1)
Basophils Relative: 0 %
EOS ABS: 0 10*3/uL (ref 0.0–0.5)
Eosinophils Relative: 0 %
HCT: 44.4 % (ref 36.0–46.0)
Hemoglobin: 14.6 g/dL (ref 12.0–15.0)
Lymphocytes Relative: 10 %
Lymphs Abs: 0.8 10*3/uL (ref 0.7–4.0)
MCH: 30.5 pg (ref 26.0–34.0)
MCHC: 32.9 g/dL (ref 30.0–36.0)
MCV: 92.7 fL (ref 80.0–100.0)
Monocytes Absolute: 0 10*3/uL — ABNORMAL LOW (ref 0.1–1.0)
Monocytes Relative: 0 %
Neutro Abs: 7.2 10*3/uL (ref 1.7–17.7)
Neutrophils Relative %: 90 %
Platelet Count: 162 10*3/uL (ref 150–400)
RBC: 4.79 MIL/uL (ref 3.87–5.11)
RDW: 12.2 % (ref 11.5–15.5)
WBC: 8 10*3/uL (ref 4.0–10.5)
nRBC: 0 % (ref 0.0–0.2)

## 2018-11-05 MED ORDER — GABAPENTIN 300 MG PO CAPS: 300.0000 mg | ORAL_CAPSULE | Freq: Three times a day (TID) | ORAL | 0 refills | Status: DC

## 2018-11-05 NOTE — Progress Notes (Signed)
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  She presented with left upper lobe lung nodule in addition to mediastinal lymphadenopathy as well as right upper lobe as well as metastatic bone disease to the sixth and 10th rib.  The patient has significant pain from her metastatic rib lesions.  PRIOR THERAPY: None.  CURRENT THERAPY:systemic chemotherapy with carboplatin for AUC of 5 on day 1, etoposide 100 mg/M2 on days 1, 2 and 3 as well as Tecentriq 1200 mg IV every 3 weeks with Neulasta support.  Status post 1 cycle.  INTERVAL HISTORY: Alison Sullivan 67 y.o. female returns to the clinic today for follow-up visit accompanied by her son.  The patient tolerated the first week of her treatment with carboplatin, etoposide and Tecentriq fairly well.  She did not receive Neulasta after this cycle because of scheduling issues.  She is feeling much better today.  She had few days of nausea after her treatment but this is improved with Compazine.  She denied having any recent weight loss or night sweats.  She has no nausea, vomiting, diarrhea or constipation.  She denied having any chest pain, shortness breath, cough or hemoptysis.  She has no headache or visual changes.  She had MRI of the brain that showed no evidence of metastatic disease to the brain.  The patient is here today for evaluation and repeat blood work.  MEDICAL HISTORY: Past Medical History:  Diagnosis Date  . Arthritis   . Chronic kidney disease   . Dog bite of wrist   . History of kidney stones   . Kidney infection   . Left renal mass   . Osteopenia   . Pyelonephritis   . Recurrent UTI (urinary tract infection)     ALLERGIES:  has No Known Allergies.  MEDICATIONS:    Current Outpatient Medications  Medication Sig Dispense Refill  . gabapentin (NEURONTIN) 300 MG capsule Take 1 capsule (300 mg total) by mouth 3 (three) times daily. 90 capsule 0  . ibuprofen (ADVIL,MOTRIN) 800 MG tablet Take 1 tablet (800 mg total) by mouth every 8 (eight) hours as needed. 90 tablet 0  . lidocaine-prilocaine (EMLA) cream Apply 1 application topically as needed. 30 g 0  . Melatonin 10 MG TABS Take by mouth at bedtime.    . methylPREDNISolone (MEDROL DOSEPAK) 4 MG TBPK tablet Use as instructed 21 tablet 0  . nicotine polacrilex (NICORETTE) 2 MG gum Take 2 mg by mouth as needed for smoking cessation.    Marland Kitchen oxyCODONE (OXY IR/ROXICODONE) 5 MG immediate release tablet 1-2 every 4 hours if needed for pain 60 tablet 0  . prochlorperazine (COMPAZINE) 10 MG tablet Take 1 tablet (10 mg total) by mouth every 6 (six) hours as needed for nausea or vomiting. 30 tablet 0  . simvastatin (ZOCOR) 20 MG tablet TAKE 1 TABLET(20 MG) BY MOUTH AT BEDTIME 90 tablet 3   No current facility-administered medications for this visit.     SURGICAL HISTORY:  Past Surgical History:  Procedure Laterality Date  . CYSTOSCOPY WITH RETROGRADE PYELOGRAM, URETEROSCOPY AND STENT PLACEMENT Left 12/28/2017   Procedure: CYSTOSCOPY WITH RETROGRADE PYELOGRAM, URETEROSCOPY AND STENT PLACEMENT, STONE BASKETRY;  Surgeon: Festus Aloe, MD;  Location: [facility];  Service: Urology;  Laterality: Left;  . FINGER SURGERY     index, growth removal  . HOLMIUM LASER APPLICATION Left 8/67/6195   Procedure: HOLMIUM LASER APPLICATION;  Surgeon: Festus Aloe, MD;  Location: [facility];  Service: Urology;  Laterality: Left;  . IR IMAGING GUIDED PORT INSERTION  10/31/2018  . KIDNEY STONE SURGERY  2011 or 2012  . LITHOTRIPSY  2007    REVIEW OF SYSTEMS:  Constitutional: positive for fatigue Eyes: negative Ears, nose, mouth, throat, and face: negative Respiratory: negative Cardiovascular:  negative Gastrointestinal: positive for nausea Genitourinary:negative Integument/breast: negative Hematologic/lymphatic: negative Musculoskeletal:negative Neurological: negative Behavioral/Psych: negative Endocrine: negative Allergic/Immunologic: negative   PHYSICAL EXAMINATION: General appearance: alert, cooperative, fatigued and no distress Head: Normocephalic, without obvious abnormality, atraumatic Neck: no adenopathy, no JVD, supple, symmetrical, trachea midline and thyroid not enlarged, symmetric, no tenderness/mass/nodules Lymph nodes: Cervical, supraclavicular, and axillary nodes normal. Resp: clear to auscultation bilaterally Back: symmetric, no curvature. ROM normal. No CVA tenderness. Cardio: regular rate and rhythm, S1, S2 normal, no murmur, click, rub or gallop GI: soft, non-tender; bowel sounds normal; no masses,  no organomegaly Extremities: extremities normal, atraumatic, no cyanosis or edema Neurologic: Alert and oriented X 3, normal strength and tone. Normal symmetric reflexes. Normal coordination and gait  ECOG PERFORMANCE STATUS: 1 - Symptomatic but completely ambulatory  Blood pressure 121/77, pulse 91, temperature 98.2 F (36.8 C), temperature source Oral, resp. rate 18, height 5\' 6"  (1.676 m), weight 149 lb 12.8 oz (67.9 kg), SpO2 96 %.  LABORATORY DATA: Lab Results  Component Value Date   WBC 8.0 11/05/2018   HGB 14.6 11/05/2018   HCT 44.4 11/05/2018   MCV 92.7 11/05/2018   PLT 162 11/05/2018      Chemistry      Component Value Date/Time   NA 140 10/29/2018 1237   K 3.5 10/29/2018 1237   CL 107 10/29/2018 1237   CO2 21 (L) 10/29/2018 1237   BUN 18 10/29/2018 1237   CREATININE 0.93 10/29/2018 1237      Component Value Date/Time   CALCIUM 9.2 10/29/2018 1237   ALKPHOS 79 10/29/2018 1237   AST 15 10/29/2018 1237   ALT 13 10/29/2018 1237   BILITOT 0.4 10/29/2018 1237       RADIOGRAPHIC STUDIES: Mr Jeri Cos KD Contrast  Result Date:  10/22/2018 CLINICAL DATA:  Non-small cell lung carcinoma.  Staging. EXAM: MRI HEAD WITHOUT AND WITH CONTRAST TECHNIQUE: Multiplanar, multiecho pulse sequences of the brain and surrounding structures were obtained without and with intravenous contrast. CONTRAST:  7 cc Gadavist COMPARISON:  None. FINDINGS: Brain: Diffusion imaging does not show any acute or subacute infarction or other cause of restricted diffusion. The brainstem is normal. There are a few old small vessel cerebellar infarctions. There are old small vessel infarctions affecting the thalami. No evidence of widespread cerebral hemispheric small-vessel disease. No evidence of mass lesion, hemorrhage, hydrocephalus or extra-axial collection. After contrast administration, no abnormal enhancement occurs. Vascular: Major vessels at the base of the brain show flow. Skull and upper cervical spine: Negative Sinuses/Orbits: Clear/normal Other: None IMPRESSION: No evidence of metastatic disease. Old small vessel infarctions affecting the cerebellum and both thalami. Electronically Signed   By: Nelson Chimes M.D.   On: 10/22/2018 11:58   Ir Imaging Guided Port Insertion  Result Date: 10/31/2018 CLINICAL DATA:  Lung cancer EXAM: TUNNEL POWER PORT PLACEMENT WITH SUBCUTANEOUS POCKET UTILIZING ULTRASOUND & FLOUROSCOPY FLUOROSCOPY TIME:  18 seconds.  1.9 mGy. MEDICATIONS AND MEDICAL HISTORY: Versed 2 mg, Fentanyl  25 mcg. Additional Medications: Ancef 2 g. Antibiotics were given within 2 hours of the procedure. ANESTHESIA/SEDATION: Moderate sedation time: minutes. Nursing monitored the the patient during the procedure. PROCEDURE: After written informed consent was obtained, patient was placed in the supine position on angiographic table. The right neck and chest was prepped and draped in a sterile fashion. Lidocaine was utilized for local anesthesia. The right jugular vein was noted to be patent initially with ultrasound. Under sonographic guidance, a micropuncture  needle was inserted into the right IJ vein (Ultrasound and fluoroscopic image documentation was performed). The needle was removed over an 018 wire which was exchanged for a Amplatz. This was advanced into the IVC. An 8-French dilator was advanced over the Amplatz. A small incision was made in the right upper chest over the anterior right second rib. Utilizing blunt dissection, a subcutaneous pocket was created in the caudal direction. The pocket was irrigated with a copious amount of sterile normal saline. The port catheter was tunneled from the chest incision, and out the neck incision. The reservoir was inserted into the subcutaneous pocket and secured with two 3-0 Ethilon stitches. A peel-away sheath was advanced over the Amplatz wire. The port catheter was cut to measure length and inserted through the peel-away sheath. The peel-away sheath was removed. The chest incision was closed with 3-0 Vicryl interrupted stitches for the subcutaneous tissue and a running of 4-0 Vicryl subcuticular stitch for the skin. The neck incision was closed with a 4-0 Vicryl subcuticular stitch. Derma-bond was applied to both surgical incisions. The port reservoir was flushed and instilled with heparinized saline. No complications. FINDINGS: A right IJ vein Port-A-Cath is in place with its tip at the cavoatrial junction. COMPLICATIONS: None IMPRESSION: Successful 8 French right internal jugular vein power port placement with its tip at the SVC/RA junction. Electronically Signed   By: Marybelle Killings M.D.   On: 10/31/2018 11:55    ASSESSMENT AND PLAN: This is a very pleasant 67 years old white female recently diagnosed with high-grade neuroendocrine carcinoma and currently undergoing systemic chemotherapy with carboplatin, etoposide and Tecentriq status post 1 cycle given last week.  The patient tolerated the first week of her treatment well except for 1 or 2 days of nausea improved with Compazine.  She missed her Neulasta injection  after the chemotherapy. The patient is feeling very well today with no concerning complaints.  CBC is unremarkable today. I recommended for the patient to continue her current treatment with the same regimen and she will come back for follow-up visit in 2 weeks for evaluation before starting cycle #2. I discussed the MRI results with the patient and her son. The patient and her son had several questions and answers them completely to their satisfaction. She was advised to call immediately if she has any concerning symptoms in the interval. The patient voices understanding of current disease status and treatment options and is in agreement with the current care plan. All questions were answered. The patient knows to call the clinic with any problems, questions or concerns. We can certainly see the patient much sooner if necessary.  I spent 15 minutes counseling the patient face to face. The total time spent in the appointment was 25 minutes.  Disclaimer: This note was dictated with voice recognition software. Similar sounding words can inadvertently be transcribed and may not be corrected upon review.

## 2018-11-05 NOTE — Telephone Encounter (Signed)
Next treatment already scheduled per 12/23 los.

## 2018-11-06 ENCOUNTER — Ambulatory Visit
Admission: RE | Admit: 2018-11-06 | Discharge: 2018-11-06 | Disposition: A | Discharge status: Home / Self Care | Payer: Medicare Other | Source: Ambulatory Visit | Attending: Radiation Oncology | Admitting: Radiation Oncology

## 2018-11-06 DIAGNOSIS — C7951 Secondary malignant neoplasm of bone: Secondary | ICD-10-CM | POA: Diagnosis not present

## 2018-11-06 DIAGNOSIS — Z923 Personal history of irradiation: Secondary | ICD-10-CM | POA: Diagnosis not present

## 2018-11-09 ENCOUNTER — Encounter: Payer: Self-pay | Admitting: Radiation Oncology

## 2018-11-09 NOTE — Progress Notes (Signed)
  Radiation Oncology         [phone] ________________________________  Name: Alison Sullivan MRN: [id number]  Date: 11/09/2018  DOB: Apr 02, 1951  End of Treatment Note  Diagnosis:   Metastatic large cell neuroendocrine carcinoma of lung, STAGE IV     Indication for treatment:  Palliative       Radiation treatment dates:   10/23/18 - 11/06/18  Site/dose:   Left Lower Rib Mass / 30 Gy delivered in 10 fractions of 3 Gy  Beams/energy:   3D, photons / 6X, 10X  Narrative: The patient tolerated radiation treatment relatively well. She reported back pain at the beginning of treatment, and this was improved by gabapentin. No other complaints.  Plan: The patient has completed radiation treatment. The patient will return to radiation oncology clinic for routine followup in one month. I advised them to call or return sooner if they have any questions or concerns related to their recovery or treatment.  -----------------------------------  [name], MD  This document serves as a record of services personally performed by [name], MD. It was created on her behalf by Wilburn Mylar, a trained medical scribe. The creation of this record is based on the scribe's personal observations and the provider's statements to them. This document has been checked and approved by the attending provider.

## 2018-11-11 DIAGNOSIS — D709 Neutropenia, unspecified: Secondary | ICD-10-CM | POA: Diagnosis not present

## 2018-11-11 DIAGNOSIS — C7951 Secondary malignant neoplasm of bone: Secondary | ICD-10-CM | POA: Diagnosis present

## 2018-11-11 DIAGNOSIS — D703 Neutropenia due to infection: Secondary | ICD-10-CM | POA: Diagnosis present

## 2018-11-11 DIAGNOSIS — J984 Other disorders of lung: Secondary | ICD-10-CM | POA: Diagnosis not present

## 2018-11-11 DIAGNOSIS — R509 Fever, unspecified: Secondary | ICD-10-CM | POA: Diagnosis not present

## 2018-11-11 DIAGNOSIS — Z87891 Personal history of nicotine dependence: Secondary | ICD-10-CM | POA: Diagnosis not present

## 2018-11-11 DIAGNOSIS — R911 Solitary pulmonary nodule: Secondary | ICD-10-CM | POA: Diagnosis not present

## 2018-11-11 DIAGNOSIS — C3490 Malignant neoplasm of unspecified part of unspecified bronchus or lung: Secondary | ICD-10-CM | POA: Diagnosis present

## 2018-11-11 DIAGNOSIS — E7800 Pure hypercholesterolemia, unspecified: Secondary | ICD-10-CM | POA: Diagnosis present

## 2018-11-11 DIAGNOSIS — R5081 Fever presenting with conditions classified elsewhere: Secondary | ICD-10-CM | POA: Diagnosis not present

## 2018-11-12 ENCOUNTER — Inpatient Hospital Stay: Payer: Medicare Other

## 2018-11-13 ENCOUNTER — Inpatient Hospital Stay: Payer: Medicare Other

## 2018-11-13 DIAGNOSIS — Z5111 Encounter for antineoplastic chemotherapy: Secondary | ICD-10-CM | POA: Diagnosis not present

## 2018-11-13 DIAGNOSIS — F17210 Nicotine dependence, cigarettes, uncomplicated: Secondary | ICD-10-CM | POA: Diagnosis not present

## 2018-11-13 DIAGNOSIS — Z5112 Encounter for antineoplastic immunotherapy: Secondary | ICD-10-CM | POA: Diagnosis not present

## 2018-11-13 DIAGNOSIS — C7A1 Malignant poorly differentiated neuroendocrine tumors: Secondary | ICD-10-CM | POA: Diagnosis not present

## 2018-11-13 DIAGNOSIS — C3492 Malignant neoplasm of unspecified part of left bronchus or lung: Secondary | ICD-10-CM

## 2018-11-13 DIAGNOSIS — N189 Chronic kidney disease, unspecified: Secondary | ICD-10-CM | POA: Diagnosis not present

## 2018-11-13 DIAGNOSIS — C7B09 Secondary carcinoid tumors of other sites: Secondary | ICD-10-CM | POA: Diagnosis not present

## 2018-11-13 LAB — CMP (CANCER CENTER ONLY)
ALT: 18 U/L (ref 0–44)
AST: 15 U/L (ref 15–41)
Albumin: 2.7 g/dL — ABNORMAL LOW (ref 3.5–5.0)
Alkaline Phosphatase: 97 U/L (ref 38–126)
Anion gap: 11 (ref 5–15)
BUN: 10 mg/dL (ref 8–23)
CHLORIDE: 106 mmol/L (ref 98–111)
CO2: 23 mmol/L (ref 22–32)
Calcium: 8.6 mg/dL — ABNORMAL LOW (ref 8.9–10.3)
Creatinine: 0.87 mg/dL (ref 0.44–1.00)
GFR, Est AFR Am: >60 mL/min (ref >60)
Glucose, Bld: 100 mg/dL — ABNORMAL HIGH (ref 70–99)
Potassium: 3.1 mmol/L — ABNORMAL LOW (ref 3.5–5.1)
Sodium: 140 mmol/L (ref 135–145)
Total Bilirubin: 0.3 mg/dL (ref 0.3–1.2)
Total Protein: 6.5 g/dL (ref 6.5–8.1)

## 2018-11-13 LAB — CBC WITH DIFFERENTIAL (CANCER CENTER ONLY)
Abs Immature Granulocytes: 0.44 10*3/uL — ABNORMAL HIGH (ref 0.00–0.07)
Basophils Absolute: 0.1 10*3/uL (ref 0.0–0.1)
Basophils Relative: 2 %
Eosinophils Absolute: 0.1 10*3/uL (ref 0.0–0.5)
Eosinophils Relative: 1 %
HCT: 38.1 % (ref 36.0–46.0)
Hemoglobin: 12.9 g/dL (ref 12.0–15.0)
Immature Granulocytes: 12 %
LYMPHS PCT: 42 %
Lymphs Abs: 1.6 10*3/uL (ref 0.7–4.0)
MCH: 30.3 pg (ref 26.0–34.0)
MCHC: 33.9 g/dL (ref 30.0–36.0)
MCV: 89.4 fL (ref 80.0–100.0)
Monocytes Absolute: 0.8 10*3/uL (ref 0.1–1.0)
Monocytes Relative: 21 %
Neutro Abs: 0.8 10*3/uL — ABNORMAL LOW (ref 1.7–7.7)
Neutrophils Relative %: 22 %
PLATELETS: 192 10*3/uL (ref 150–400)
RBC: 4.26 MIL/uL (ref 3.87–5.11)
RDW: 12.3 % (ref 11.5–15.5)
WBC: 3.7 10*3/uL — AB (ref 4.0–10.5)
nRBC: 0 % (ref 0.0–0.2)

## 2018-11-15 ENCOUNTER — Telehealth: Payer: Self-pay | Admitting: *Deleted

## 2018-11-15 NOTE — Telephone Encounter (Signed)
[name] patient regarding her lab work from 11/13/18. Spoke with patient and informed her that her K+ was low at 3.1. Instructed her to increase high potassium foods such as bananas, avocados,, squash, nuts beans, yogurt etc. Pt voiced understanding. Pt also informed this nurse that she had been in the hospital in [place], [place] over the past weekend d/t fever and leukopenia. She is home now and feeling fairly well. Temp last night was 99.1. WBC on 11/13/18 was 3.7 and ANC was 0.8 She will continue to monitor her temps and call if > 100.5. She is aware of upcoming appts on 11/19/18

## 2018-11-15 NOTE — Telephone Encounter (Signed)
-----   Message from Ardeen Garland, RN sent at 11/14/2018 12:47 PM EST -----  ----- Message ----- From: Curt Bears, MD Sent: 11/13/2018   4:47 PM EST To: Ardeen Garland, RN  Please advise her with K rich diet. ----- Message ----- From: Buel Ream, Lab [place] Sent: 11/13/2018   2:55 PM EST To: Curt Bears, MD

## 2018-11-19 ENCOUNTER — Inpatient Hospital Stay: Payer: Medicare Other

## 2018-11-19 ENCOUNTER — Telehealth: Payer: Self-pay | Admitting: Internal Medicine

## 2018-11-19 ENCOUNTER — Encounter: Payer: Self-pay | Admitting: Internal Medicine

## 2018-11-19 ENCOUNTER — Inpatient Hospital Stay (HOSPITAL_BASED_OUTPATIENT_CLINIC_OR_DEPARTMENT_OTHER): Payer: Medicare Other | Admitting: Internal Medicine

## 2018-11-19 ENCOUNTER — Inpatient Hospital Stay: Payer: Medicare Other | Attending: Internal Medicine

## 2018-11-19 VITALS — BP 116/71 | HR 99 | Temp 98.1°F | Resp 14 | Ht 66.0 in | Wt 145.4 lb

## 2018-11-19 DIAGNOSIS — N189 Chronic kidney disease, unspecified: Secondary | ICD-10-CM

## 2018-11-19 DIAGNOSIS — Z79899 Other long term (current) drug therapy: Secondary | ICD-10-CM | POA: Insufficient documentation

## 2018-11-19 DIAGNOSIS — Z5111 Encounter for antineoplastic chemotherapy: Secondary | ICD-10-CM | POA: Diagnosis not present

## 2018-11-19 DIAGNOSIS — C3492 Malignant neoplasm of unspecified part of left bronchus or lung: Secondary | ICD-10-CM

## 2018-11-19 DIAGNOSIS — M8589 Other specified disorders of bone density and structure, multiple sites: Secondary | ICD-10-CM | POA: Diagnosis not present

## 2018-11-19 DIAGNOSIS — Z5112 Encounter for antineoplastic immunotherapy: Secondary | ICD-10-CM | POA: Diagnosis not present

## 2018-11-19 DIAGNOSIS — C7A1 Malignant poorly differentiated neuroendocrine tumors: Secondary | ICD-10-CM | POA: Diagnosis not present

## 2018-11-19 DIAGNOSIS — Z5189 Encounter for other specified aftercare: Secondary | ICD-10-CM | POA: Diagnosis not present

## 2018-11-19 DIAGNOSIS — D701 Agranulocytosis secondary to cancer chemotherapy: Secondary | ICD-10-CM | POA: Diagnosis not present

## 2018-11-19 DIAGNOSIS — C7B09 Secondary carcinoid tumors of other sites: Secondary | ICD-10-CM

## 2018-11-19 DIAGNOSIS — C7951 Secondary malignant neoplasm of bone: Secondary | ICD-10-CM

## 2018-11-19 DIAGNOSIS — C7B8 Other secondary neuroendocrine tumors: Secondary | ICD-10-CM | POA: Diagnosis not present

## 2018-11-19 DIAGNOSIS — Z8673 Personal history of transient ischemic attack (TIA), and cerebral infarction without residual deficits: Secondary | ICD-10-CM | POA: Insufficient documentation

## 2018-11-19 DIAGNOSIS — Z95828 Presence of other vascular implants and grafts: Secondary | ICD-10-CM

## 2018-11-19 LAB — CMP (CANCER CENTER ONLY)
ALT: 13 U/L (ref 0–44)
ANION GAP: 13 (ref 5–15)
AST: 16 U/L (ref 15–41)
Albumin: 2.9 g/dL — ABNORMAL LOW (ref 3.5–5.0)
Alkaline Phosphatase: 101 U/L (ref 38–126)
BUN: 16 mg/dL (ref 8–23)
CO2: 19 mmol/L — AB (ref 22–32)
Calcium: 9.3 mg/dL (ref 8.9–10.3)
Chloride: 105 mmol/L (ref 98–111)
Creatinine: 1.04 mg/dL — ABNORMAL HIGH (ref 0.44–1.00)
GFR, Est AFR Am: >60 mL/min (ref >60)
GFR, Estimated: 56 mL/min — ABNORMAL LOW (ref >60)
Glucose, Bld: 128 mg/dL — ABNORMAL HIGH (ref 70–99)
Potassium: 4.5 mmol/L (ref 3.5–5.1)
SODIUM: 137 mmol/L (ref 135–145)
Total Bilirubin: 0.3 mg/dL (ref 0.3–1.2)
Total Protein: 7.7 g/dL (ref 6.5–8.1)

## 2018-11-19 LAB — CBC WITH DIFFERENTIAL (CANCER CENTER ONLY)
Abs Immature Granulocytes: 0.73 10*3/uL — ABNORMAL HIGH (ref 0.00–0.07)
BASOS PCT: 0 %
Basophils Absolute: 0.1 10*3/uL (ref 0.0–0.1)
EOS ABS: 0 10*3/uL (ref 0.0–0.5)
Eosinophils Relative: 0 %
HCT: 41.1 % (ref 36.0–46.0)
Hemoglobin: 13.7 g/dL (ref 12.0–15.0)
Immature Granulocytes: 5 %
Lymphocytes Relative: 10 %
Lymphs Abs: 1.6 10*3/uL (ref 0.7–4.0)
MCH: 30.3 pg (ref 26.0–34.0)
MCHC: 33.3 g/dL (ref 30.0–36.0)
MCV: 90.9 fL (ref 80.0–100.0)
Monocytes Absolute: 1.2 10*3/uL — ABNORMAL HIGH (ref 0.1–1.0)
Monocytes Relative: 8 %
Neutro Abs: 11.5 10*3/uL — ABNORMAL HIGH (ref 1.7–7.7)
Neutrophils Relative %: 77 %
PLATELETS: 471 10*3/uL — AB (ref 150–400)
RBC: 4.52 MIL/uL (ref 3.87–5.11)
RDW: 13 % (ref 11.5–15.5)
WBC: 15.1 10*3/uL — AB (ref 4.0–10.5)
nRBC: 0 % (ref 0.0–0.2)

## 2018-11-19 MED ORDER — HEPARIN SOD (PORK) LOCK FLUSH 100 UNIT/ML IV SOLN
500.0000 [IU] | Freq: Once | INTRAVENOUS | Status: AC | PRN
  Administered 2018-11-19: 500 [IU]
  Filled 2018-11-19: qty 5

## 2018-11-19 MED ORDER — SODIUM CHLORIDE 0.9% FLUSH
10.0000 mL | INTRAVENOUS | Status: DC | PRN
  Administered 2018-11-19: 10 mL
  Filled 2018-11-19: qty 10

## 2018-11-19 MED ORDER — SODIUM CHLORIDE 0.9 % IV SOLN
Freq: Once | INTRAVENOUS | Status: AC
  Administered 2018-11-19: 12:00:00 via INTRAVENOUS
  Filled 2018-11-19: qty 250

## 2018-11-19 MED ORDER — ALTEPLASE 2 MG IJ SOLR
2.0000 mg | Freq: Once | INTRAMUSCULAR | Status: AC
  Administered 2018-11-19: 2 mg
  Filled 2018-11-19: qty 2

## 2018-11-19 MED ORDER — SODIUM CHLORIDE 0.9 % IV SOLN
100.0000 mg/m2 | Freq: Once | INTRAVENOUS | Status: AC
  Administered 2018-11-19: 180 mg via INTRAVENOUS
  Filled 2018-11-19: qty 9

## 2018-11-19 MED ORDER — SODIUM CHLORIDE 0.9 % IV SOLN
1200.0000 mg | Freq: Once | INTRAVENOUS | Status: AC
  Administered 2018-11-19: 1200 mg via INTRAVENOUS
  Filled 2018-11-19: qty 20

## 2018-11-19 MED ORDER — SODIUM CHLORIDE 0.9% FLUSH
10.0000 mL | INTRAVENOUS | Status: DC | PRN
  Administered 2018-11-19: 10 mL via INTRAVENOUS
  Filled 2018-11-19: qty 10

## 2018-11-19 MED ORDER — SODIUM CHLORIDE 0.9 % IV SOLN
Freq: Once | INTRAVENOUS | Status: AC
  Administered 2018-11-19: 12:00:00 via INTRAVENOUS
  Filled 2018-11-19: qty 5

## 2018-11-19 MED ORDER — PALONOSETRON HCL INJECTION 0.25 MG/5ML
0.2500 mg | Freq: Once | INTRAVENOUS | Status: AC
  Administered 2018-11-19: 0.25 mg via INTRAVENOUS

## 2018-11-19 MED ORDER — SODIUM CHLORIDE 0.9 % IV SOLN
418.0000 mg | Freq: Once | INTRAVENOUS | Status: AC
  Administered 2018-11-19: 420 mg via INTRAVENOUS
  Filled 2018-11-19: qty 42

## 2018-11-19 MED ORDER — ALTEPLASE 2 MG IJ SOLR
INTRAMUSCULAR | Status: AC
  Filled 2018-11-19: qty 2

## 2018-11-19 MED ORDER — PALONOSETRON HCL INJECTION 0.25 MG/5ML
INTRAVENOUS | Status: AC
  Filled 2018-11-19: qty 5

## 2018-11-19 NOTE — Telephone Encounter (Signed)
Scheduled appt per 1/6 los - pt to get an updated schedule next visit

## 2018-11-19 NOTE — Progress Notes (Signed)
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  She presented with left upper lobe lung nodule in addition to mediastinal lymphadenopathy as well as right upper lobe as well as metastatic bone disease to the sixth and 10th rib.  The patient has significant pain from her metastatic rib lesions.  PRIOR THERAPY: None.  CURRENT THERAPY:systemic chemotherapy with carboplatin for AUC of 5 on day 1, etoposide 100 mg/M2 on days 1, 2 and 3 as well as Tecentriq 1200 mg IV every 3 weeks with Neulasta support.  Status post 1 cycle.  INTERVAL HISTORY: Alison Sullivan 68 y.o. female returns to the clinic today for follow-up visit.  The patient is feeling fine today with no specific complaints except for fatigue.  She was seen at the emergency department close to [facility] with neutropenic fever.  The patient received 2 doses of Granix during her treatment and she had improvement in her condition.  She was also started on treatment with Bactrim for suspicious pneumonia.  She denied having any current chest pain, shortness of breath, cough or hemoptysis.  She denied having any current fever or chills.  She has no nausea, vomiting, diarrhea or constipation.  She is here today for evaluation before starting cycle #2 of her treatment.  MEDICAL HISTORY: Past Medical History:  Diagnosis Date  . Arthritis   . Chronic kidney disease   . Dog bite of wrist   . History of kidney stones   . Kidney infection   . Left renal mass   . Osteopenia   . Pyelonephritis   . Recurrent UTI (urinary tract infection)     ALLERGIES:  has No Known Allergies.  MEDICATIONS:  Current Outpatient Medications  Medication Sig Dispense Refill  . gabapentin  (NEURONTIN) 300 MG capsule Take 1 capsule (300 mg total) by mouth 3 (three) times daily. 90 capsule 0  . ibuprofen (ADVIL,MOTRIN) 800 MG tablet Take 1 tablet (800 mg total) by mouth every 8 (eight) hours as needed. 90 tablet 0  . lidocaine-prilocaine (EMLA) cream Apply 1 application topically as needed. 30 g 0  . Melatonin 10 MG TABS Take by mouth at bedtime.    . methylPREDNISolone (MEDROL DOSEPAK) 4 MG TBPK tablet Use as instructed 21 tablet 0  . nicotine polacrilex (NICORETTE) 2 MG gum Take 2 mg by mouth as needed for smoking cessation.    Marland Kitchen oxyCODONE (OXY IR/ROXICODONE) 5 MG immediate release tablet 1-2 every 4 hours if needed for pain 60 tablet 0  . prochlorperazine (COMPAZINE) 10 MG tablet Take 1 tablet (10 mg total) by mouth every 6 (six) hours as needed for nausea or vomiting. 30 tablet 0  . simvastatin (ZOCOR) 20 MG tablet TAKE 1 TABLET(20 MG) BY MOUTH AT BEDTIME 90 tablet 3   No current facility-administered medications for this visit.    Facility-Administered Medications Ordered in Other Visits  Medication Dose Route Frequency Provider Last Rate Last Dose  . alteplase (CATHFLO ACTIVASE) injection 2 mg  2 mg Intracatheter Once Curt Bears, MD      . sodium chloride flush (NS) 0.9 % injection 10 mL  10 mL Intravenous PRN Curt Bears, MD   10 mL at 11/19/18 1019    SURGICAL HISTORY:  Past Surgical History:  Procedure Laterality Date  . CYSTOSCOPY WITH RETROGRADE PYELOGRAM, URETEROSCOPY AND STENT PLACEMENT Left 12/28/2017   Procedure: CYSTOSCOPY WITH RETROGRADE PYELOGRAM, URETEROSCOPY AND STENT PLACEMENT, STONE BASKETRY;  Surgeon: Festus Aloe, MD;  Location: [facility];  Service: Urology;  Laterality: Left;  . FINGER SURGERY     index, growth removal  . HOLMIUM LASER APPLICATION Left 01/05/9797   Procedure: HOLMIUM LASER APPLICATION;  Surgeon: Festus Aloe, MD;  Location: [facility];  Service: Urology;  Laterality: Left;  . IR  IMAGING GUIDED PORT INSERTION  10/31/2018  . KIDNEY STONE SURGERY  2011 or 2012  . LITHOTRIPSY  2007    REVIEW OF SYSTEMS:  A comprehensive review of systems was negative except for: Constitutional: positive for fatigue   PHYSICAL EXAMINATION: General appearance: alert, cooperative, fatigued and no distress Head: Normocephalic, without obvious abnormality, atraumatic Neck: no adenopathy, no JVD, supple, symmetrical, trachea midline and thyroid not enlarged, symmetric, no tenderness/mass/nodules Lymph nodes: Cervical, supraclavicular, and axillary nodes normal. Resp: clear to auscultation bilaterally Back: symmetric, no curvature. ROM normal. No CVA tenderness. Cardio: regular rate and rhythm, S1, S2 normal, no murmur, click, rub or gallop GI: soft, non-tender; bowel sounds normal; no masses,  no organomegaly Extremities: extremities normal, atraumatic, no cyanosis or edema  ECOG PERFORMANCE STATUS: 1 - Symptomatic but completely ambulatory  Blood pressure 116/71, pulse 99, temperature 98.1 F (36.7 C), temperature source Oral, resp. rate 14, height 5\' 6"  (1.676 m), weight 145 lb 6.4 oz (66 kg), SpO2 96 %.  LABORATORY DATA: Lab Results  Component Value Date   WBC 15.1 (H) 11/19/2018   HGB 13.7 11/19/2018   HCT 41.1 11/19/2018   MCV 90.9 11/19/2018   PLT 471 (H) 11/19/2018      Chemistry      Component Value Date/Time   NA 140 11/13/2018 1443   K 3.1 (L) 11/13/2018 1443   CL 106 11/13/2018 1443   CO2 23 11/13/2018 1443   BUN 10 11/13/2018 1443   CREATININE 0.87 11/13/2018 1443      Component Value Date/Time   CALCIUM 8.6 (L) 11/13/2018 1443   ALKPHOS 97 11/13/2018 1443   AST 15 11/13/2018 1443   ALT 18 11/13/2018 1443   BILITOT 0.3 11/13/2018 1443       RADIOGRAPHIC STUDIES: Mr Jeri Cos XQ Contrast  Result Date: 10/22/2018 CLINICAL DATA:  Non-small cell lung carcinoma.  Staging. EXAM: MRI HEAD WITHOUT AND WITH CONTRAST TECHNIQUE: Multiplanar, multiecho pulse  sequences of the brain and surrounding structures were obtained without and with intravenous contrast. CONTRAST:  7 cc Gadavist COMPARISON:  None. FINDINGS: Brain: Diffusion imaging does not show any acute or subacute infarction or other cause of restricted diffusion. The brainstem is normal. There are a few old small vessel cerebellar infarctions. There are old small vessel infarctions affecting the thalami. No evidence of widespread cerebral hemispheric small-vessel disease. No evidence of mass lesion, hemorrhage, hydrocephalus or extra-axial collection. After contrast administration, no abnormal enhancement occurs. Vascular: Major vessels at the base of the brain show flow. Skull and upper cervical spine: Negative Sinuses/Orbits: Clear/normal Other: None IMPRESSION: No evidence of metastatic disease. Old small vessel infarctions affecting the cerebellum and both thalami. Electronically Signed   By: Nelson Chimes M.D.   On: 10/22/2018 11:58   Ir Imaging Guided Port Insertion  Result Date: 10/31/2018 CLINICAL DATA:  Lung cancer EXAM: TUNNEL POWER PORT PLACEMENT WITH SUBCUTANEOUS POCKET UTILIZING ULTRASOUND & FLOUROSCOPY FLUOROSCOPY TIME:  18 seconds.  1.9 mGy. MEDICATIONS  AND MEDICAL HISTORY: Versed 2 mg, Fentanyl 25 mcg. Additional Medications: Ancef 2 g. Antibiotics were given within 2 hours of the procedure. ANESTHESIA/SEDATION: Moderate sedation time: minutes. Nursing monitored the the patient during the procedure. PROCEDURE: After written informed consent was obtained, patient was placed in the supine position on angiographic table. The right neck and chest was prepped and draped in a sterile fashion. Lidocaine was utilized for local anesthesia. The right jugular vein was noted to be patent initially with ultrasound. Under sonographic guidance, a micropuncture needle was inserted into the right IJ vein (Ultrasound and fluoroscopic image documentation was performed). The needle was removed over an 018 wire  which was exchanged for a Amplatz. This was advanced into the IVC. An 8-French dilator was advanced over the Amplatz. A small incision was made in the right upper chest over the anterior right second rib. Utilizing blunt dissection, a subcutaneous pocket was created in the caudal direction. The pocket was irrigated with a copious amount of sterile normal saline. The port catheter was tunneled from the chest incision, and out the neck incision. The reservoir was inserted into the subcutaneous pocket and secured with two 3-0 Ethilon stitches. A peel-away sheath was advanced over the Amplatz wire. The port catheter was cut to measure length and inserted through the peel-away sheath. The peel-away sheath was removed. The chest incision was closed with 3-0 Vicryl interrupted stitches for the subcutaneous tissue and a running of 4-0 Vicryl subcuticular stitch for the skin. The neck incision was closed with a 4-0 Vicryl subcuticular stitch. Derma-bond was applied to both surgical incisions. The port reservoir was flushed and instilled with heparinized saline. No complications. FINDINGS: A right IJ vein Port-A-Cath is in place with its tip at the cavoatrial junction. COMPLICATIONS: None IMPRESSION: Successful 8 French right internal jugular vein power port placement with its tip at the SVC/RA junction. Electronically Signed   By: Marybelle Killings M.D.   On: 10/31/2018 11:55    ASSESSMENT AND PLAN: This is a very pleasant 68 years old white female recently diagnosed with high-grade neuroendocrine carcinoma and currently undergoing systemic chemotherapy with carboplatin, etoposide and Tecentriq status post 1 cycle. The patient tolerated the first cycle of this treatment fairly well except for chemotherapy-induced neutropenia with suspicious neutropenic fever and inflammation of the lung.  She was treated with Granix after her first dose of the treatment since the patient did not receive Neulasta injection last time. She is  feeling much better today. I recommended for her to proceed with cycle #2 today as scheduled. I will see her back for follow-up visit in 3 weeks for evaluation after repeating CT scan of the chest, abdomen and pelvis for restaging of her disease. The patient was advised to call immediately if she has any concerning symptoms in the interval. The patient voices understanding of current disease status and treatment options and is in agreement with the current care plan. All questions were answered. The patient knows to call the clinic with any problems, questions or concerns. We can certainly see the patient much sooner if necessary.  I spent 10 minutes counseling the patient face to face. The total time spent in the appointment was 15 minutes.  Disclaimer: This note was dictated with voice recognition software. Similar sounding words can inadvertently be transcribed and may not be corrected upon review.

## 2018-11-19 NOTE — Patient Instructions (Signed)
[facility] Discharge Instructions for Patients Receiving Chemotherapy  Today you received the following chemotherapy agents :  Tecentriq, Carboplatin, Etoposide.  To help prevent nausea and vomiting after your treatment, we encourage you to take your nausea medication as prescribed.   If you develop nausea and vomiting that is not controlled by your nausea medication, call the clinic.   BELOW ARE SYMPTOMS THAT SHOULD BE REPORTED IMMEDIATELY:  *FEVER GREATER THAN 100.5 F  *CHILLS WITH OR WITHOUT FEVER  NAUSEA AND VOMITING THAT IS NOT CONTROLLED WITH YOUR NAUSEA MEDICATION  *UNUSUAL SHORTNESS OF BREATH  *UNUSUAL BRUISING OR BLEEDING  TENDERNESS IN MOUTH AND THROAT WITH OR WITHOUT PRESENCE OF ULCERS  *URINARY PROBLEMS  *BOWEL PROBLEMS  UNUSUAL RASH Items with * indicate a potential emergency and should be followed up as soon as possible.  Feel free to call the clinic should you have any questions or concerns. The clinic phone number is [phone].  Please show the [facility] at check-in to the Emergency Department and triage nurse.

## 2018-11-20 ENCOUNTER — Other Ambulatory Visit: Payer: Self-pay | Admitting: Medical Oncology

## 2018-11-20 ENCOUNTER — Other Ambulatory Visit: Payer: Self-pay | Admitting: Radiation Oncology

## 2018-11-20 ENCOUNTER — Encounter: Payer: Self-pay | Admitting: *Deleted

## 2018-11-20 ENCOUNTER — Inpatient Hospital Stay: Payer: Medicare Other

## 2018-11-20 VITALS — BP 116/74 | HR 89 | Temp 97.9°F | Resp 16

## 2018-11-20 DIAGNOSIS — Z5189 Encounter for other specified aftercare: Secondary | ICD-10-CM | POA: Diagnosis not present

## 2018-11-20 DIAGNOSIS — C7A1 Malignant poorly differentiated neuroendocrine tumors: Secondary | ICD-10-CM | POA: Diagnosis not present

## 2018-11-20 DIAGNOSIS — Z5111 Encounter for antineoplastic chemotherapy: Secondary | ICD-10-CM | POA: Diagnosis not present

## 2018-11-20 DIAGNOSIS — Z5112 Encounter for antineoplastic immunotherapy: Secondary | ICD-10-CM | POA: Diagnosis not present

## 2018-11-20 DIAGNOSIS — C3492 Malignant neoplasm of unspecified part of left bronchus or lung: Secondary | ICD-10-CM

## 2018-11-20 DIAGNOSIS — C7951 Secondary malignant neoplasm of bone: Secondary | ICD-10-CM

## 2018-11-20 DIAGNOSIS — C7B8 Other secondary neuroendocrine tumors: Secondary | ICD-10-CM | POA: Diagnosis not present

## 2018-11-20 DIAGNOSIS — D701 Agranulocytosis secondary to cancer chemotherapy: Secondary | ICD-10-CM | POA: Diagnosis not present

## 2018-11-20 MED ORDER — HEPARIN SOD (PORK) LOCK FLUSH 100 UNIT/ML IV SOLN
500.0000 [IU] | Freq: Once | INTRAVENOUS | Status: AC | PRN
  Administered 2018-11-20: 500 [IU]
  Filled 2018-11-20: qty 5

## 2018-11-20 MED ORDER — DEXAMETHASONE SODIUM PHOSPHATE 10 MG/ML IJ SOLN
10.0000 mg | Freq: Once | INTRAMUSCULAR | Status: AC
  Administered 2018-11-20: 10 mg via INTRAVENOUS

## 2018-11-20 MED ORDER — DEXAMETHASONE SODIUM PHOSPHATE 10 MG/ML IJ SOLN
INTRAMUSCULAR | Status: AC
  Filled 2018-11-20: qty 1

## 2018-11-20 MED ORDER — SODIUM CHLORIDE 0.9% FLUSH
10.0000 mL | INTRAVENOUS | Status: DC | PRN
  Administered 2018-11-20: 10 mL
  Filled 2018-11-20: qty 10

## 2018-11-20 MED ORDER — SODIUM CHLORIDE 0.9 % IV SOLN
Freq: Once | INTRAVENOUS | Status: AC
  Administered 2018-11-20: 14:00:00 via INTRAVENOUS
  Filled 2018-11-20: qty 250

## 2018-11-20 MED ORDER — OXYCODONE HCL 5 MG PO TABS: ORAL_TABLET | ORAL | 0 refills | Status: DC

## 2018-11-20 MED ORDER — SODIUM CHLORIDE 0.9 % IV SOLN
100.0000 mg/m2 | Freq: Once | INTRAVENOUS | Status: AC
  Administered 2018-11-20: 180 mg via INTRAVENOUS
  Filled 2018-11-20: qty 9

## 2018-11-20 NOTE — Patient Instructions (Signed)
[facility] Discharge Instructions for Patients Receiving Chemotherapy  Today you received the following chemotherapy agents Etoposide.   To help prevent nausea and vomiting after your treatment, we encourage you to take your nausea medication as directed.  If you develop nausea and vomiting that is not controlled by your nausea medication, call the clinic.   BELOW ARE SYMPTOMS THAT SHOULD BE REPORTED IMMEDIATELY:  *FEVER GREATER THAN 100.5 F  *CHILLS WITH OR WITHOUT FEVER  NAUSEA AND VOMITING THAT IS NOT CONTROLLED WITH YOUR NAUSEA MEDICATION  *UNUSUAL SHORTNESS OF BREATH  *UNUSUAL BRUISING OR BLEEDING  TENDERNESS IN MOUTH AND THROAT WITH OR WITHOUT PRESENCE OF ULCERS  *URINARY PROBLEMS  *BOWEL PROBLEMS  UNUSUAL RASH Items with * indicate a potential emergency and should be followed up as soon as possible.  Feel free to call the clinic should you have any questions or concerns. The clinic phone number is [phone].  Please show the [place] at check-in to the Emergency Department and triage nurse.

## 2018-11-21 ENCOUNTER — Inpatient Hospital Stay: Payer: Medicare Other

## 2018-11-21 VITALS — BP 118/66 | HR 73 | Temp 97.8°F | Resp 14

## 2018-11-21 DIAGNOSIS — D701 Agranulocytosis secondary to cancer chemotherapy: Secondary | ICD-10-CM | POA: Diagnosis not present

## 2018-11-21 DIAGNOSIS — Z5111 Encounter for antineoplastic chemotherapy: Secondary | ICD-10-CM | POA: Diagnosis not present

## 2018-11-21 DIAGNOSIS — C7B8 Other secondary neuroendocrine tumors: Secondary | ICD-10-CM | POA: Diagnosis not present

## 2018-11-21 DIAGNOSIS — Z5189 Encounter for other specified aftercare: Secondary | ICD-10-CM | POA: Diagnosis not present

## 2018-11-21 DIAGNOSIS — Z5112 Encounter for antineoplastic immunotherapy: Secondary | ICD-10-CM | POA: Diagnosis not present

## 2018-11-21 DIAGNOSIS — C3492 Malignant neoplasm of unspecified part of left bronchus or lung: Secondary | ICD-10-CM

## 2018-11-21 DIAGNOSIS — C7A1 Malignant poorly differentiated neuroendocrine tumors: Secondary | ICD-10-CM | POA: Diagnosis not present

## 2018-11-21 MED ORDER — DEXAMETHASONE SODIUM PHOSPHATE 10 MG/ML IJ SOLN
INTRAMUSCULAR | Status: AC
  Filled 2018-11-21: qty 1

## 2018-11-21 MED ORDER — HEPARIN SOD (PORK) LOCK FLUSH 100 UNIT/ML IV SOLN
500.0000 [IU] | Freq: Once | INTRAVENOUS | Status: AC | PRN
  Administered 2018-11-21: 500 [IU]
  Filled 2018-11-21: qty 5

## 2018-11-21 MED ORDER — DEXAMETHASONE SODIUM PHOSPHATE 10 MG/ML IJ SOLN
10.0000 mg | Freq: Once | INTRAMUSCULAR | Status: AC
  Administered 2018-11-21: 10 mg via INTRAVENOUS

## 2018-11-21 MED ORDER — SODIUM CHLORIDE 0.9% FLUSH
10.0000 mL | INTRAVENOUS | Status: DC | PRN
  Administered 2018-11-21: 10 mL
  Filled 2018-11-21: qty 10

## 2018-11-21 MED ORDER — SODIUM CHLORIDE 0.9 % IV SOLN
100.0000 mg/m2 | Freq: Once | INTRAVENOUS | Status: AC
  Administered 2018-11-21: 180 mg via INTRAVENOUS
  Filled 2018-11-21: qty 9

## 2018-11-21 NOTE — Patient Instructions (Signed)
[facility] Discharge Instructions for Patients Receiving Chemotherapy  Today you received the following chemotherapy agents Etoposide.   To help prevent nausea and vomiting after your treatment, we encourage you to take your nausea medication as directed.  If you develop nausea and vomiting that is not controlled by your nausea medication, call the clinic.   BELOW ARE SYMPTOMS THAT SHOULD BE REPORTED IMMEDIATELY:  *FEVER GREATER THAN 100.5 F  *CHILLS WITH OR WITHOUT FEVER  NAUSEA AND VOMITING THAT IS NOT CONTROLLED WITH YOUR NAUSEA MEDICATION  *UNUSUAL SHORTNESS OF BREATH  *UNUSUAL BRUISING OR BLEEDING  TENDERNESS IN MOUTH AND THROAT WITH OR WITHOUT PRESENCE OF ULCERS  *URINARY PROBLEMS  *BOWEL PROBLEMS  UNUSUAL RASH Items with * indicate a potential emergency and should be followed up as soon as possible.  Feel free to call the clinic should you have any questions or concerns. The clinic phone number is [phone].  Please show the [place] at check-in to the Emergency Department and triage nurse.

## 2018-11-23 ENCOUNTER — Inpatient Hospital Stay: Payer: Medicare Other

## 2018-11-23 VITALS — BP 103/66 | HR 99 | Temp 97.5°F | Resp 18

## 2018-11-23 DIAGNOSIS — Z5189 Encounter for other specified aftercare: Secondary | ICD-10-CM | POA: Diagnosis not present

## 2018-11-23 DIAGNOSIS — C7A1 Malignant poorly differentiated neuroendocrine tumors: Secondary | ICD-10-CM | POA: Diagnosis not present

## 2018-11-23 DIAGNOSIS — Z5111 Encounter for antineoplastic chemotherapy: Secondary | ICD-10-CM | POA: Diagnosis not present

## 2018-11-23 DIAGNOSIS — Z5112 Encounter for antineoplastic immunotherapy: Secondary | ICD-10-CM | POA: Diagnosis not present

## 2018-11-23 DIAGNOSIS — C3492 Malignant neoplasm of unspecified part of left bronchus or lung: Secondary | ICD-10-CM

## 2018-11-23 DIAGNOSIS — D701 Agranulocytosis secondary to cancer chemotherapy: Secondary | ICD-10-CM | POA: Diagnosis not present

## 2018-11-23 DIAGNOSIS — C7B8 Other secondary neuroendocrine tumors: Secondary | ICD-10-CM | POA: Diagnosis not present

## 2018-11-23 MED ORDER — PEGFILGRASTIM-CBQV 6 MG/0.6ML ~~LOC~~ SOSY
6.0000 mg | PREFILLED_SYRINGE | Freq: Once | SUBCUTANEOUS | Status: AC
  Administered 2018-11-23: 6 mg via SUBCUTANEOUS

## 2018-11-23 MED ORDER — PEGFILGRASTIM-CBQV 6 MG/0.6ML ~~LOC~~ SOSY
PREFILLED_SYRINGE | SUBCUTANEOUS | Status: AC
  Filled 2018-11-23: qty 0.6

## 2018-11-23 NOTE — Progress Notes (Signed)
[facility] Social Work  Clinical Social Work was referred by self  to review and complete healthcare advance directives.  Clinical Social Worker met with patient in infusion room. The patient designated son, Shea Stakes, as their primary healthcare agent and no secondary agent.  Patient also completed healthcare living will.    Clinical Social Worker notarized documents and made copies for patient/family. Clinical Social Worker will send documents to medical records to be scanned into patient's chart. Clinical Social Worker encouraged patient/family to contact with any additional questions or concerns.  Maryjean Morn, MSW, LCSW Clinical Social Worker [facility] [phone]

## 2018-11-26 ENCOUNTER — Inpatient Hospital Stay: Payer: Medicare Other

## 2018-11-26 ENCOUNTER — Other Ambulatory Visit: Payer: Self-pay | Admitting: *Deleted

## 2018-11-26 DIAGNOSIS — C7A1 Malignant poorly differentiated neuroendocrine tumors: Secondary | ICD-10-CM | POA: Diagnosis not present

## 2018-11-26 DIAGNOSIS — Z5112 Encounter for antineoplastic immunotherapy: Secondary | ICD-10-CM | POA: Diagnosis not present

## 2018-11-26 DIAGNOSIS — C7B8 Other secondary neuroendocrine tumors: Secondary | ICD-10-CM | POA: Diagnosis not present

## 2018-11-26 DIAGNOSIS — Z5189 Encounter for other specified aftercare: Secondary | ICD-10-CM | POA: Diagnosis not present

## 2018-11-26 DIAGNOSIS — D701 Agranulocytosis secondary to cancer chemotherapy: Secondary | ICD-10-CM | POA: Diagnosis not present

## 2018-11-26 DIAGNOSIS — Z5111 Encounter for antineoplastic chemotherapy: Secondary | ICD-10-CM | POA: Diagnosis not present

## 2018-11-26 DIAGNOSIS — R5382 Chronic fatigue, unspecified: Secondary | ICD-10-CM

## 2018-11-26 DIAGNOSIS — C3492 Malignant neoplasm of unspecified part of left bronchus or lung: Secondary | ICD-10-CM

## 2018-11-26 LAB — CBC WITH DIFFERENTIAL (CANCER CENTER ONLY)
Abs Immature Granulocytes: 1.04 10*3/uL — ABNORMAL HIGH (ref 0.00–0.07)
Basophils Absolute: 0.1 10*3/uL (ref 0.0–0.1)
Basophils Relative: 1 %
Eosinophils Absolute: 0 10*3/uL (ref 0.0–0.5)
Eosinophils Relative: 0 %
HCT: 42.3 % (ref 36.0–46.0)
Hemoglobin: 14 g/dL (ref 12.0–15.0)
Immature Granulocytes: 11 %
LYMPHS PCT: 14 %
Lymphs Abs: 1.3 10*3/uL (ref 0.7–4.0)
MCH: 30.1 pg (ref 26.0–34.0)
MCHC: 33.1 g/dL (ref 30.0–36.0)
MCV: 91 fL (ref 80.0–100.0)
Monocytes Absolute: 0.1 10*3/uL (ref 0.1–1.0)
Monocytes Relative: 1 %
Neutro Abs: 6.9 10*3/uL (ref 1.7–7.7)
Neutrophils Relative %: 73 %
Platelet Count: 387 10*3/uL (ref 150–400)
RBC: 4.65 MIL/uL (ref 3.87–5.11)
RDW: 12.5 % (ref 11.5–15.5)
WBC Count: 9.4 10*3/uL (ref 4.0–10.5)
nRBC: 0 % (ref 0.0–0.2)

## 2018-11-26 LAB — CMP (CANCER CENTER ONLY)
ALT: 17 U/L (ref 0–44)
AST: 14 U/L — ABNORMAL LOW (ref 15–41)
Albumin: 3.5 g/dL (ref 3.5–5.0)
Alkaline Phosphatase: 117 U/L (ref 38–126)
Anion gap: 10 (ref 5–15)
BUN: 30 mg/dL — ABNORMAL HIGH (ref 8–23)
CHLORIDE: 103 mmol/L (ref 98–111)
CO2: 26 mmol/L (ref 22–32)
Calcium: 9.1 mg/dL (ref 8.9–10.3)
Creatinine: 0.74 mg/dL (ref 0.44–1.00)
GFR, Est AFR Am: >60 mL/min (ref >60)
GFR, Estimated: >60 mL/min (ref >60)
Glucose, Bld: 110 mg/dL — ABNORMAL HIGH (ref 70–99)
Potassium: 3.9 mmol/L (ref 3.5–5.1)
SODIUM: 139 mmol/L (ref 135–145)
Total Bilirubin: 0.8 mg/dL (ref 0.3–1.2)
Total Protein: 7.6 g/dL (ref 6.5–8.1)

## 2018-11-26 LAB — TSH: TSH: 0.955 u[IU]/mL (ref 0.308–3.960)

## 2018-11-26 NOTE — Telephone Encounter (Signed)
Pt called CT scan has not been scheduled. Scan appt made, lmovm for pt with appt information.

## 2018-12-03 ENCOUNTER — Inpatient Hospital Stay: Payer: Medicare Other

## 2018-12-03 ENCOUNTER — Ambulatory Visit (HOSPITAL_COMMUNITY)
Admission: RE | Admit: 2018-12-03 | Discharge: 2018-12-03 | Disposition: A | Discharge status: Home / Self Care | Payer: Medicare Other | Source: Ambulatory Visit | Attending: Internal Medicine | Admitting: Internal Medicine

## 2018-12-03 DIAGNOSIS — D701 Agranulocytosis secondary to cancer chemotherapy: Secondary | ICD-10-CM | POA: Diagnosis not present

## 2018-12-03 DIAGNOSIS — C3492 Malignant neoplasm of unspecified part of left bronchus or lung: Secondary | ICD-10-CM

## 2018-12-03 DIAGNOSIS — Z5189 Encounter for other specified aftercare: Secondary | ICD-10-CM | POA: Diagnosis not present

## 2018-12-03 DIAGNOSIS — C7B8 Other secondary neuroendocrine tumors: Secondary | ICD-10-CM | POA: Diagnosis not present

## 2018-12-03 DIAGNOSIS — C7951 Secondary malignant neoplasm of bone: Secondary | ICD-10-CM | POA: Diagnosis not present

## 2018-12-03 DIAGNOSIS — C7A1 Malignant poorly differentiated neuroendocrine tumors: Secondary | ICD-10-CM | POA: Diagnosis not present

## 2018-12-03 DIAGNOSIS — Z5111 Encounter for antineoplastic chemotherapy: Secondary | ICD-10-CM | POA: Diagnosis not present

## 2018-12-03 DIAGNOSIS — Z5112 Encounter for antineoplastic immunotherapy: Secondary | ICD-10-CM | POA: Diagnosis not present

## 2018-12-03 LAB — CBC WITH DIFFERENTIAL (CANCER CENTER ONLY)
Abs Immature Granulocytes: 10.34 10*3/uL — ABNORMAL HIGH (ref 0.00–0.07)
BASOS PCT: 0 %
Basophils Absolute: 0 10*3/uL (ref 0.0–0.1)
Eosinophils Absolute: 0 10*3/uL (ref 0.0–0.5)
Eosinophils Relative: 0 %
HCT: 38.3 % (ref 36.0–46.0)
Hemoglobin: 12.8 g/dL (ref 12.0–15.0)
Immature Granulocytes: 37 %
Lymphocytes Relative: 11 %
Lymphs Abs: 3.1 10*3/uL (ref 0.7–4.0)
MCH: 30.5 pg (ref 26.0–34.0)
MCHC: 33.4 g/dL (ref 30.0–36.0)
MCV: 91.2 fL (ref 80.0–100.0)
Monocytes Absolute: 2.6 10*3/uL — ABNORMAL HIGH (ref 0.1–1.0)
Monocytes Relative: 9 %
Neutro Abs: 12.1 10*3/uL — ABNORMAL HIGH (ref 1.7–7.7)
Neutrophils Relative %: 43 %
Platelet Count: 47 10*3/uL — ABNORMAL LOW (ref 150–400)
RBC: 4.2 MIL/uL (ref 3.87–5.11)
RDW: 12.7 % (ref 11.5–15.5)
WBC Count: 28.2 10*3/uL — ABNORMAL HIGH (ref 4.0–10.5)
nRBC: 0.1 % (ref 0.0–0.2)

## 2018-12-03 LAB — CMP (CANCER CENTER ONLY)
ALK PHOS: 121 U/L (ref 38–126)
ALT: 24 U/L (ref 0–44)
AST: 23 U/L (ref 15–41)
Albumin: 3.4 g/dL — ABNORMAL LOW (ref 3.5–5.0)
Anion gap: 10 (ref 5–15)
BUN: 16 mg/dL (ref 8–23)
CO2: 26 mmol/L (ref 22–32)
Calcium: 9.1 mg/dL (ref 8.9–10.3)
Chloride: 105 mmol/L (ref 98–111)
Creatinine: 0.81 mg/dL (ref 0.44–1.00)
GFR, Est AFR Am: >60 mL/min (ref >60)
GFR, Estimated: >60 mL/min (ref >60)
Glucose, Bld: 115 mg/dL — ABNORMAL HIGH (ref 70–99)
Potassium: 3.6 mmol/L (ref 3.5–5.1)
SODIUM: 141 mmol/L (ref 135–145)
Total Bilirubin: 0.3 mg/dL (ref 0.3–1.2)
Total Protein: 7.1 g/dL (ref 6.5–8.1)

## 2018-12-03 MED ORDER — HEPARIN SOD (PORK) LOCK FLUSH 100 UNIT/ML IV SOLN
INTRAVENOUS | Status: AC
  Filled 2018-12-03: qty 5

## 2018-12-03 MED ORDER — SODIUM CHLORIDE (PF) 0.9 % IJ SOLN
INTRAMUSCULAR | Status: AC
  Filled 2018-12-03: qty 50

## 2018-12-03 MED ORDER — IOHEXOL 300 MG/ML  SOLN
100.0000 mL | Freq: Once | INTRAMUSCULAR | Status: AC | PRN
  Administered 2018-12-03: 100 mL via INTRAVENOUS

## 2018-12-05 ENCOUNTER — Encounter: Payer: Self-pay | Admitting: Radiation Oncology

## 2018-12-05 NOTE — Progress Notes (Signed)
Alison [facility] presents for follow up of radiation completed 11/06/18 to her left lower rib. She reports pain to her left lower abdomen which she relates to recent oral contrast for CT. She denies concerns related to radiation she received. She reports decreased swelling at her radiation site. She saw Dr. Julien Nordmann on 11/19/18 and received chemotherapy. She had a CT of her abdomen, chest on 12/03/18 and will see Dr. Julien Nordmann next on 12/10/18.   BP 100/69 (Patient Position: Sitting)   Pulse (!) 108   Temp 98 F (36.7 C) (Oral)   Resp 20   Ht 5\' 6"  (1.676 m)   Wt 142 lb 3.2 oz (64.5 kg)   SpO2 98%   BMI 22.95 kg/m    Wt Readings from Last 3 Encounters:  12/07/18 142 lb 3.2 oz (64.5 kg)  11/19/18 145 lb 6.4 oz (66 kg)  11/05/18 149 lb 12.8 oz (67.9 kg)

## 2018-12-07 ENCOUNTER — Encounter: Payer: Self-pay | Admitting: Radiation Oncology

## 2018-12-07 ENCOUNTER — Ambulatory Visit
Admission: RE | Admit: 2018-12-07 | Discharge: 2018-12-07 | Disposition: A | Discharge status: Home / Self Care | Payer: Medicare Other | Source: Ambulatory Visit | Attending: Radiation Oncology | Admitting: Radiation Oncology

## 2018-12-07 ENCOUNTER — Other Ambulatory Visit: Payer: Self-pay

## 2018-12-07 VITALS — BP 100/69 | HR 108 | Temp 98.0°F | Resp 20 | Ht 66.0 in | Wt 142.2 lb

## 2018-12-07 DIAGNOSIS — Z79899 Other long term (current) drug therapy: Secondary | ICD-10-CM | POA: Insufficient documentation

## 2018-12-07 DIAGNOSIS — K5730 Diverticulosis of large intestine without perforation or abscess without bleeding: Secondary | ICD-10-CM | POA: Diagnosis not present

## 2018-12-07 DIAGNOSIS — C7951 Secondary malignant neoplasm of bone: Secondary | ICD-10-CM | POA: Diagnosis not present

## 2018-12-07 DIAGNOSIS — I700 Atherosclerosis of aorta: Secondary | ICD-10-CM | POA: Diagnosis not present

## 2018-12-07 DIAGNOSIS — N200 Calculus of kidney: Secondary | ICD-10-CM | POA: Diagnosis not present

## 2018-12-07 DIAGNOSIS — J439 Emphysema, unspecified: Secondary | ICD-10-CM | POA: Insufficient documentation

## 2018-12-07 DIAGNOSIS — C3412 Malignant neoplasm of upper lobe, left bronchus or lung: Secondary | ICD-10-CM | POA: Diagnosis not present

## 2018-12-07 DIAGNOSIS — N2889 Other specified disorders of kidney and ureter: Secondary | ICD-10-CM | POA: Insufficient documentation

## 2018-12-07 HISTORY — DX: Personal history of irradiation: Z92.3

## 2018-12-07 NOTE — Progress Notes (Addendum)
Radiation Oncology         [phone] ________________________________  Name: Alison [facility] MRN: [id number]  Date: 12/07/2018  DOB: 1951-05-18  Follow-Up Visit Note  Outpatient  CC: [name], MD  Tanda Rockers, MD  Diagnosis and Prior Radiotherapy:    ICD-10-CM   1. Bone metastases ([place]) C79.51     CHIEF COMPLAINT: Here for follow-up and surveillance of metastatic lung cancer  Radiation treatment dates:   10/23/18 - 11/06/18  Site/dose:   Left Lower Rib Mass / 30 Gy delivered in 10 fractions of 3 Gy  Narrative:  The patient returns today for routine follow-up following completion of radiation therapy on 11/06/2018. Rib pain is much better. She notes that she has had left lower abdominal pain, which she attributes to her recent CT scan infusion. She is not taking any medications for her pain. She has an appointment to follow up with Dr. Julien Nordmann on 12/10/2018. She is about to start on her 3rd cycle of chemotherapy. She notes that since she went through treatment, she notes that the area has decreased in size and is without irritation. She does have an urologist, Dr. Junious Silk and she has been followed for many years due to kidney issues. She notes that she attempted to move a box of documents and had some bumps to appear to her left forearm. This incident occurred 1.5 weeks ago and she denies any worsening of her symptoms. She denies having any phlebotomy from that area. This area is very tender to the touch.   CT CAP w contrast on 12/03/2018 showed: Potential small amount of nonocclusive thrombus in the SVC adjacent to the distal tip of the catheter, suspicion for catheter induced thrombus. Oral anticoagulation therapy should be strongly considered. Mixed appearance of the patient's malignancy, with enlargement of the pericardial nodule and left upper lobe nodule, but with some decrease in size in the soft tissue mass encasing the pathologic fracture of the left tenth rib.  Overall most of the previously hypermetabolic lymph nodes and pleural metastatic lesions seen slightly increased or stable, with the only lesion significantly reduced in size the encasing mass of the left tenth rib. Stable appearance of the superior segment right lower lobe sub solid nodule which was previously hypermetabolic. There is new indistinct and mild sclerosis in the T10 vertebral body eccentric to the right and extending into the right pedicle. There numerous scattered cystic lesions of varying complexity in both kidneys. Somewhat unusual persistent configuration of caliber change in the ascending colon, with normal caliber cecum but small caliber of the colon distal to the cecum. An occult colon lesion could possibly cause this type of appearance. Other imaging findings of potential clinical significance: Aortic Atherosclerosis and Emphysema. Suspected small right adnexal cyst. Numerous bilateral nonobstructive renal calculi.  I personally reviewed the patient's imaging prior to today's visit.     I shared the images w/ her.     ALLERGIES:  has No Known Allergies.  Meds: Current Outpatient Medications  Medication Sig Dispense Refill  . lidocaine-prilocaine (EMLA) cream Apply 1 application topically as needed. 30 g 0  . Melatonin 10 MG TABS Take by mouth at bedtime.    . simvastatin (ZOCOR) 20 MG tablet TAKE 1 TABLET(20 MG) BY MOUTH AT BEDTIME 90 tablet 3  . gabapentin (NEURONTIN) 300 MG capsule Take 1 capsule (300 mg total) by mouth 3 (three) times daily. (Patient not taking: Reported on 12/07/2018) 90 capsule 0  . ibuprofen (ADVIL,MOTRIN) 800 MG  tablet Take 1 tablet (800 mg total) by mouth every 8 (eight) hours as needed. (Patient not taking: Reported on 12/07/2018) 90 tablet 0  . oxyCODONE (OXY IR/ROXICODONE) 5 MG immediate release tablet Take 1-2 tabs BID PRN pain. (Patient not taking: Reported on 12/07/2018) 60 tablet 0  . prochlorperazine (COMPAZINE) 10 MG tablet Take 1 tablet (10 mg  total) by mouth every 6 (six) hours as needed for nausea or vomiting. (Patient not taking: Reported on 12/07/2018) 30 tablet 0   No current facility-administered medications for this encounter.     Physical Findings: The patient is in no acute distress. Patient is alert and oriented.  height is 5\' 6"  (1.676 m) and weight is 142 lb 3.2 oz (64.5 kg). Her oral temperature is 98 F (36.7 C). Her blood pressure is 100/69 and her pulse is 108 (abnormal). Her respiration is 20 and oxygen saturation is 98%. .    Satisfactory skin healing in radiotherapy fields.  Little darkness and dry skin in the left posterior back which is likely from the radiation. Soft tissue swelling in area of left tenth rib has really decreased substantially.  2 subcutaneous nodules in the medial distal left forearm.   Lab Findings: Lab Results  Component Value Date   WBC 28.2 (H) 12/03/2018   HGB 12.8 12/03/2018   HCT 38.3 12/03/2018   MCV 91.2 12/03/2018   PLT 47 (L) 12/03/2018    Radiographic Findings: Ct Chest W Contrast  Result Date: 12/03/2018 CLINICAL DATA:  High-grade neuroendocrine cancer diagnosed in November 2019, metastatic to bone. Restaging assessment. EXAM: CT CHEST, ABDOMEN, AND PELVIS WITH CONTRAST TECHNIQUE: Multidetector CT imaging of the chest, abdomen and pelvis was performed following the standard protocol during bolus administration of intravenous contrast. CONTRAST:  163mL OMNIPAQUE IOHEXOL 300 MG/ML  SOLN COMPARISON:  Multiple exams, including CT chest from 08/21/2018 and PET-CT from 09/18/2018 FINDINGS: CT CHEST FINDINGS Cardiovascular: Right Port-A-Cath tip: SVC. There is some low-density suspicious for thrombus in the lower SVC anteriorly in the vicinity of the distal catheter, for example on image 28/2. I am skeptical that this is from venous mixing given that the azygos vein is also fully opacified. Aortic atherosclerotic calcification. Mediastinum/Nodes: Prevascular node along the left side of  the aortic arch, 1.1 cm in thickness, formerly 1.0 cm. Left suprahilar node 0.8 cm in short axis, previously 0.7 cm. AP window lymph node 1.3 cm in short axis on image 23/2, formerly the same. A mass in the pericardial adipose tissues just outside of the pericardium measures 2.8 by 2.3 cm, formerly 2.0 by 1.9 cm. Lungs/Pleura: The previously hypermetabolic left upper lobe nodule measures 2.4 by 1.7 cm on image 65/4, previously 2.2 by 1.6 cm when measured in the same fashion. There continues to be a band of nodular density in the right upper lobe, which previously demonstrated only low-grade activity. Posteriorly in the superior segment right lower lobe there is a confluent but ill-defined airspace opacity for example on image 50/4 which was previously notably hypermetabolic, and which could represent some type of low-grade adenocarcinoma. This has a similar appearance to the prior exam, measuring roughly 1.3 cm anterior-posterior on image 51/4. Peripheral interstitial accentuation in the lungs is once again identified. Emphysema noted. No new lung nodules are evident. A pleural-based enhancing nodule medially in the tenth intercostal/paraspinal space measures 2.1 by 1.0 cm on image 47/2, formerly by my measurements 1.9 by 0.9 cm. A small lesion along the pleural side of the twelfth rib measures 8 mm  in thickness, formerly the same on 09/18/2018. Musculoskeletal: Left posterolateral tenth rib fracture with surrounding mass reduced in size compared to the prior exam. This mass is most reliably measured in thickness and currently measures at 1.8 cm, previously 2.8 cm. This mass in the adjacent intercostal tissues were previously markedly hypermetabolic. Indistinct and rather mild sclerosis in the T10 vertebral body posteriorly and eccentric to the right, extending into the right pedicle, appears new from 09/18/2018. CT ABDOMEN PELVIS FINDINGS Hepatobiliary: Several tiny 3 mm hypodense lesions in the liver are  technically too small to characterize although statistically likely to be benign. Gallbladder unremarkable. No biliary dilatation. Pancreas: Unremarkable Spleen: Unremarkable Adrenals/Urinary Tract: Both adrenal glands appear normal. Numerous scattered cystic lesions of varying complexity are present in both kidneys. Most of these are too small to characterize, overall there dozens of lesions. Numerous bilateral nonobstructive renal calculi are present. One of the larger right-sided calculi is in the lower pole and measures 0.9 cm in long axis. One of the larger left-sided calculi is in mid kidney measures 0.5 cm in long axis. No ureteral or bladder calculus is identified. No hydronephrosis. There scattered scarring in the kidneys along with preserved fetal lobulation. Stomach/Bowel: Nondistended colon aside from the cecum. There is a transition in caliber between the cecum and the rest of the colon which was also present on 09/18/2018, with some accentuated activity at the transition, although that may be physiologic. There is striking sigmoid colon diverticulosis. Vascular/Lymphatic: Aortoiliac atherosclerotic vascular disease. Circumaortic left renal vein. Reproductive: Small fluid density structure associated with the right adnexa, not previously hypermetabolic, potentially a cyst. Uterus unremarkable. Other: No supplemental non-categorized findings. Musculoskeletal: Unremarkable IMPRESSION: 1. Potential small amount of nonocclusive thrombus in the SVC adjacent to the distal tip of the catheter, suspicion for catheter induced thrombus. Oral anticoagulation therapy should be strongly considered. 2. Mixed appearance of the patient's malignancy, with enlargement of the pericardial nodule and left upper lobe nodule, but with some decrease in size in the soft tissue mass encasing the pathologic fracture of the left tenth rib. Overall most of the previously hypermetabolic lymph nodes and pleural metastatic lesions  seen slightly increased or stable, with the only lesion significantly reduced in size the encasing mass of the left tenth rib. 3. Stable appearance of the superior segment right lower lobe sub solid nodule which was previously hypermetabolic. 4. There is new indistinct and mild sclerosis in the T10 vertebral body eccentric to the right and extending into the right pedicle. This could be from metastatic disease or a reaction to radiation therapy in the region. Thoracic spine MRI with and without contrast be utilized for further characterization. 5. There numerous scattered cystic lesions of varying complexity in both kidneys. Most of these are too small to characterize. If clinically warranted, renal protocol MRI with and without contrast could characterize a greater percentage of these lesions, or they could simply be surveilled in the context of the patient's expected future imaging. 6. Somewhat unusual persistent configuration of caliber change in the ascending colon, with normal caliber cecum but small caliber of the colon distal to the cecum. An occult colon lesion could possibly cause this type of appearance. Correlation with the patient's colon cancer screening history is recommended. If screening is not up-to-date, appropriate screening should be considered. 7. Other imaging findings of potential clinical significance: Aortic Atherosclerosis (ICD10-I70.0) and Emphysema (ICD10-J43.9). Suspected small right adnexal cyst. Numerous bilateral nonobstructive renal calculi. These results were called by telephone at the time of  interpretation on 12/03/2018 at 5:23 pm to Dr. Lindi Adie, who verbally acknowledged these results. Electronically Signed   By: Van Clines M.D.   On: 12/03/2018 17:24   Ct Abdomen Pelvis W Contrast  Result Date: 12/03/2018 CLINICAL DATA:  High-grade neuroendocrine cancer diagnosed in November 2019, metastatic to bone. Restaging assessment. EXAM: CT CHEST, ABDOMEN, AND PELVIS WITH  CONTRAST TECHNIQUE: Multidetector CT imaging of the chest, abdomen and pelvis was performed following the standard protocol during bolus administration of intravenous contrast. CONTRAST:  177mL OMNIPAQUE IOHEXOL 300 MG/ML  SOLN COMPARISON:  Multiple exams, including CT chest from 08/21/2018 and PET-CT from 09/18/2018 FINDINGS: CT CHEST FINDINGS Cardiovascular: Right Port-A-Cath tip: SVC. There is some low-density suspicious for thrombus in the lower SVC anteriorly in the vicinity of the distal catheter, for example on image 28/2. I am skeptical that this is from venous mixing given that the azygos vein is also fully opacified. Aortic atherosclerotic calcification. Mediastinum/Nodes: Prevascular node along the left side of the aortic arch, 1.1 cm in thickness, formerly 1.0 cm. Left suprahilar node 0.8 cm in short axis, previously 0.7 cm. AP window lymph node 1.3 cm in short axis on image 23/2, formerly the same. A mass in the pericardial adipose tissues just outside of the pericardium measures 2.8 by 2.3 cm, formerly 2.0 by 1.9 cm. Lungs/Pleura: The previously hypermetabolic left upper lobe nodule measures 2.4 by 1.7 cm on image 65/4, previously 2.2 by 1.6 cm when measured in the same fashion. There continues to be a band of nodular density in the right upper lobe, which previously demonstrated only low-grade activity. Posteriorly in the superior segment right lower lobe there is a confluent but ill-defined airspace opacity for example on image 50/4 which was previously notably hypermetabolic, and which could represent some type of low-grade adenocarcinoma. This has a similar appearance to the prior exam, measuring roughly 1.3 cm anterior-posterior on image 51/4. Peripheral interstitial accentuation in the lungs is once again identified. Emphysema noted. No new lung nodules are evident. A pleural-based enhancing nodule medially in the tenth intercostal/paraspinal space measures 2.1 by 1.0 cm on image 47/2, formerly  by my measurements 1.9 by 0.9 cm. A small lesion along the pleural side of the twelfth rib measures 8 mm in thickness, formerly the same on 09/18/2018. Musculoskeletal: Left posterolateral tenth rib fracture with surrounding mass reduced in size compared to the prior exam. This mass is most reliably measured in thickness and currently measures at 1.8 cm, previously 2.8 cm. This mass in the adjacent intercostal tissues were previously markedly hypermetabolic. Indistinct and rather mild sclerosis in the T10 vertebral body posteriorly and eccentric to the right, extending into the right pedicle, appears new from 09/18/2018. CT ABDOMEN PELVIS FINDINGS Hepatobiliary: Several tiny 3 mm hypodense lesions in the liver are technically too small to characterize although statistically likely to be benign. Gallbladder unremarkable. No biliary dilatation. Pancreas: Unremarkable Spleen: Unremarkable Adrenals/Urinary Tract: Both adrenal glands appear normal. Numerous scattered cystic lesions of varying complexity are present in both kidneys. Most of these are too small to characterize, overall there dozens of lesions. Numerous bilateral nonobstructive renal calculi are present. One of the larger right-sided calculi is in the lower pole and measures 0.9 cm in long axis. One of the larger left-sided calculi is in mid kidney measures 0.5 cm in long axis. No ureteral or bladder calculus is identified. No hydronephrosis. There scattered scarring in the kidneys along with preserved fetal lobulation. Stomach/Bowel: Nondistended colon aside from the cecum. There is a  transition in caliber between the cecum and the rest of the colon which was also present on 09/18/2018, with some accentuated activity at the transition, although that may be physiologic. There is striking sigmoid colon diverticulosis. Vascular/Lymphatic: Aortoiliac atherosclerotic vascular disease. Circumaortic left renal vein. Reproductive: Small fluid density structure  associated with the right adnexa, not previously hypermetabolic, potentially a cyst. Uterus unremarkable. Other: No supplemental non-categorized findings. Musculoskeletal: Unremarkable IMPRESSION: 1. Potential small amount of nonocclusive thrombus in the SVC adjacent to the distal tip of the catheter, suspicion for catheter induced thrombus. Oral anticoagulation therapy should be strongly considered. 2. Mixed appearance of the patient's malignancy, with enlargement of the pericardial nodule and left upper lobe nodule, but with some decrease in size in the soft tissue mass encasing the pathologic fracture of the left tenth rib. Overall most of the previously hypermetabolic lymph nodes and pleural metastatic lesions seen slightly increased or stable, with the only lesion significantly reduced in size the encasing mass of the left tenth rib. 3. Stable appearance of the superior segment right lower lobe sub solid nodule which was previously hypermetabolic. 4. There is new indistinct and mild sclerosis in the T10 vertebral body eccentric to the right and extending into the right pedicle. This could be from metastatic disease or a reaction to radiation therapy in the region. Thoracic spine MRI with and without contrast be utilized for further characterization. 5. There numerous scattered cystic lesions of varying complexity in both kidneys. Most of these are too small to characterize. If clinically warranted, renal protocol MRI with and without contrast could characterize a greater percentage of these lesions, or they could simply be surveilled in the context of the patient's expected future imaging. 6. Somewhat unusual persistent configuration of caliber change in the ascending colon, with normal caliber cecum but small caliber of the colon distal to the cecum. An occult colon lesion could possibly cause this type of appearance. Correlation with the patient's colon cancer screening history is recommended. If screening is  not up-to-date, appropriate screening should be considered. 7. Other imaging findings of potential clinical significance: Aortic Atherosclerosis (ICD10-I70.0) and Emphysema (ICD10-J43.9). Suspected small right adnexal cyst. Numerous bilateral nonobstructive renal calculi. These results were called by telephone at the time of interpretation on 12/03/2018 at 5:23 pm to Dr. Lindi Adie, who verbally acknowledged these results. Electronically Signed   By: Van Clines M.D.   On: 12/03/2018 17:24    Impression/Plan: Healing well from radiotherapy to the tissue. Good palliation to rib lesion.  Kidney lesions - patient will see Dr Julien Nordmann next week and ask him if she should follow up with her Urologist, Dr. Junious Silk.   Advised that the patient monitor her subcutaneous nodules as well as follow up with Dr. Julien Nordmann to discuss possible [place] if they worsen/do not resolve over the next several weeks. She may also discuss the additional findings in the CT he ordered for restaging, and how this will affect her systemic therapy and medications.  I will place a referral to our Social Worker so that the patient can have someone to speak with since her son will be graduating soon will be starting a career in [place] - she is sad about empty nesting. I will ask if the Social Worker can meet with the patient during her visit next week or to call and set up a time to meet.   I will see her back on an as-needed basis. I have encouraged her to call if she has any issues or  concerns in the future. I wished her the very best.    I spent 30 minutes  face to face with the patient and more than 50% of that time was spent in counseling and/or coordination of care. _________________________   [name], MD   This document serves as a record of services personally performed by [name], MD. It was created on her behalf by Steva Colder, a trained medical scribe. The creation of this record is based on the scribe's  personal observations and the provider's statements to them. This document has been checked and approved by the attending provider.

## 2018-12-10 ENCOUNTER — Inpatient Hospital Stay: Payer: Medicare Other

## 2018-12-10 ENCOUNTER — Telehealth: Payer: Self-pay | Admitting: *Deleted

## 2018-12-10 ENCOUNTER — Encounter: Payer: Self-pay | Admitting: Internal Medicine

## 2018-12-10 ENCOUNTER — Inpatient Hospital Stay (HOSPITAL_BASED_OUTPATIENT_CLINIC_OR_DEPARTMENT_OTHER): Payer: Medicare Other | Admitting: Internal Medicine

## 2018-12-10 VITALS — BP 116/70 | HR 99 | Temp 98.3°F | Resp 18 | Ht 66.0 in | Wt 142.2 lb

## 2018-12-10 DIAGNOSIS — N189 Chronic kidney disease, unspecified: Secondary | ICD-10-CM

## 2018-12-10 DIAGNOSIS — C3492 Malignant neoplasm of unspecified part of left bronchus or lung: Secondary | ICD-10-CM

## 2018-12-10 DIAGNOSIS — C7A1 Malignant poorly differentiated neuroendocrine tumors: Secondary | ICD-10-CM | POA: Diagnosis not present

## 2018-12-10 DIAGNOSIS — D701 Agranulocytosis secondary to cancer chemotherapy: Secondary | ICD-10-CM

## 2018-12-10 DIAGNOSIS — Z79899 Other long term (current) drug therapy: Secondary | ICD-10-CM | POA: Diagnosis not present

## 2018-12-10 DIAGNOSIS — C7B8 Other secondary neuroendocrine tumors: Secondary | ICD-10-CM

## 2018-12-10 DIAGNOSIS — Z5112 Encounter for antineoplastic immunotherapy: Secondary | ICD-10-CM | POA: Diagnosis not present

## 2018-12-10 DIAGNOSIS — Z7901 Long term (current) use of anticoagulants: Secondary | ICD-10-CM | POA: Diagnosis not present

## 2018-12-10 DIAGNOSIS — R5382 Chronic fatigue, unspecified: Secondary | ICD-10-CM

## 2018-12-10 DIAGNOSIS — Z5111 Encounter for antineoplastic chemotherapy: Secondary | ICD-10-CM

## 2018-12-10 DIAGNOSIS — C7951 Secondary malignant neoplasm of bone: Secondary | ICD-10-CM

## 2018-12-10 DIAGNOSIS — Z5189 Encounter for other specified aftercare: Secondary | ICD-10-CM | POA: Diagnosis not present

## 2018-12-10 LAB — CMP (CANCER CENTER ONLY)
ALT: 21 U/L (ref 0–44)
ANION GAP: 9 (ref 5–15)
AST: 19 U/L (ref 15–41)
Albumin: 3.2 g/dL — ABNORMAL LOW (ref 3.5–5.0)
Alkaline Phosphatase: 102 U/L (ref 38–126)
BUN: 13 mg/dL (ref 8–23)
CO2: 26 mmol/L (ref 22–32)
Calcium: 9.1 mg/dL (ref 8.9–10.3)
Chloride: 105 mmol/L (ref 98–111)
Creatinine: 0.8 mg/dL (ref 0.44–1.00)
GFR, Est AFR Am: >60 mL/min (ref >60)
GFR, Estimated: >60 mL/min (ref >60)
Glucose, Bld: 119 mg/dL — ABNORMAL HIGH (ref 70–99)
Potassium: 3.5 mmol/L (ref 3.5–5.1)
Sodium: 140 mmol/L (ref 135–145)
Total Bilirubin: 0.4 mg/dL (ref 0.3–1.2)
Total Protein: 6.9 g/dL (ref 6.5–8.1)

## 2018-12-10 LAB — CBC WITH DIFFERENTIAL (CANCER CENTER ONLY)
Abs Immature Granulocytes: 0.27 10*3/uL — ABNORMAL HIGH (ref 0.00–0.07)
Basophils Absolute: 0.1 10*3/uL (ref 0.0–0.1)
Basophils Relative: 1 %
Eosinophils Absolute: 0 10*3/uL (ref 0.0–0.5)
Eosinophils Relative: 0 %
HCT: 35.7 % — ABNORMAL LOW (ref 36.0–46.0)
Hemoglobin: 11.9 g/dL — ABNORMAL LOW (ref 12.0–15.0)
Immature Granulocytes: 2 %
Lymphocytes Relative: 13 %
Lymphs Abs: 1.6 10*3/uL (ref 0.7–4.0)
MCH: 30.1 pg (ref 26.0–34.0)
MCHC: 33.3 g/dL (ref 30.0–36.0)
MCV: 90.4 fL (ref 80.0–100.0)
Monocytes Absolute: 1.4 10*3/uL — ABNORMAL HIGH (ref 0.1–1.0)
Monocytes Relative: 11 %
Neutro Abs: 9.2 10*3/uL — ABNORMAL HIGH (ref 1.7–7.7)
Neutrophils Relative %: 73 %
PLATELETS: 425 10*3/uL — AB (ref 150–400)
RBC: 3.95 MIL/uL (ref 3.87–5.11)
RDW: 13.7 % (ref 11.5–15.5)
WBC: 12.6 10*3/uL — AB (ref 4.0–10.5)
nRBC: 0 % (ref 0.0–0.2)

## 2018-12-10 LAB — TSH: TSH: 0.816 u[IU]/mL (ref 0.308–3.960)

## 2018-12-10 MED ORDER — FOLIC ACID 1 MG PO TABS: 1.0000 mg | ORAL_TABLET | Freq: Every day | ORAL | 4 refills | Status: AC

## 2018-12-10 MED ORDER — RIVAROXABAN (XARELTO) VTE STARTER PACK (15 & 20 MG): ORAL_TABLET | ORAL | 0 refills | Status: DC

## 2018-12-10 MED ORDER — CYANOCOBALAMIN 1000 MCG/ML IJ SOLN
INTRAMUSCULAR | Status: AC
  Filled 2018-12-10: qty 1

## 2018-12-10 MED ORDER — CYANOCOBALAMIN 1000 MCG/ML IJ SOLN
1000.0000 ug | Freq: Once | INTRAMUSCULAR | Status: AC
  Administered 2018-12-10: 1000 ug via INTRAMUSCULAR

## 2018-12-10 NOTE — Telephone Encounter (Signed)
CALLED SOCIAL WORK TO ARRANGE THIS APPT. FOR THIS PATIENT, LVM ON ABIGAIL ELMORE'S VM

## 2018-12-10 NOTE — Progress Notes (Signed)
START ON PATHWAY REGIMEN - Non-Small Cell Lung     A cycle is every 21 days:     Pembrolizumab      Pemetrexed      Carboplatin   **Always confirm dose/schedule in your pharmacy ordering system**  Patient Characteristics: Stage IV Metastatic, Nonsquamous, Initial Chemotherapy/Immunotherapy, PS = 0, 1, ALK Translocation Negative/Unknown and EGFR Mutation Negative/Non-Sensitizing/Unknown, PD-L1 Expression Positive 1-49% (TPS) / Negative / Not Tested / Awaiting Test Results  and Immunotherapy Candidate AJCC T Category: T1b Current Disease Status: Distant Metastases AJCC N Category: N2 AJCC M Category: M1c AJCC 8 Stage Grouping: IVB Histology: Nonsquamous Cell ROS1 Rearrangement Status: Awaiting Test Results T790M Mutation Status: Not Applicable - EGFR Mutation Negative/Unknown Other Mutations/Biomarkers: No Other Actionable Mutations NTRK Gene Fusion Status: Awaiting Test Results PD-L1 Expression Status: Awaiting Test Results Chemotherapy/Immunotherapy LOT: Initial Chemotherapy/Immunotherapy Molecular Targeted Therapy: Not Appropriate ALK Translocation Status: Awaiting Test Results EGFR Mutation Status: Awaiting Test Results BRAF V600E Mutation Status: Awaiting Test Results Performance Status: PS = 0, 1 Immunotherapy Candidate Status: Candidate for Immunotherapy Intent of Therapy: Non-Curative / Palliative Intent, Discussed with Patient 

## 2018-12-10 NOTE — Progress Notes (Signed)
[place] [phone]   Fax:([phone]  OFFICE PROGRESS NOTE  [name], MD [place] [address]  DIAGNOSIS: Stage IV (T1b, N2, M1c) 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  She presented with left upper lobe lung nodule in addition to mediastinal lymphadenopathy as well as right upper lobe as well as metastatic bone disease to the sixth and 10th rib.  The patient has significant pain from her metastatic rib lesions.  PRIOR THERAPY: systemic chemotherapy with carboplatin for AUC of 5 on day 1, etoposide 100 mg/M2 on days 1, 2 and 3 as well as Tecentriq 1200 mg IV every 3 weeks with Neulasta support.  Status post 2 cycles, discontinued secondary to disease progression. .  CURRENT THERAPY: Systemic chemotherapy with carboplatin for AUC of 5, Alimta 500 mg/M2 and Keytruda 200 mg IV every 3 weeks.  First dose December 17, 2018.  INTERVAL HISTORY: Alison Sullivan 68 y.o. female returns to the clinic today for follow-up visit.  The patient is feeling fine today with no concerning complaints.  She denied having any recent chest pain but has mild shortness of breath with exertion with no cough or hemoptysis.  She denied having any recent weight loss or night sweats.  She has no nausea, vomiting, diarrhea or constipation.  She denied having any headache or visual changes.  She tolerated the second cycle of her treatment fairly well.  The patient had repeat CT scan of the chest, abdomen and pelvis performed recently and she is here for evaluation and discussion of her scan results.  MEDICAL HISTORY: Past Medical History:  Diagnosis Date  . Arthritis   . Chronic kidney disease   . Dog bite of wrist   . History of kidney stones   . History of radiation therapy 10/23/18- 11/06/18   Left lower rib mass/ 30 Gy delivered in 10 fractions 3 Gy.   . Kidney infection   . Left  renal mass   . Osteopenia   . Pyelonephritis   . Recurrent UTI (urinary tract infection)     ALLERGIES:  has No Known Allergies.  MEDICATIONS:  Current Outpatient Medications  Medication Sig Dispense Refill  . gabapentin (NEURONTIN) 300 MG capsule Take 1 capsule (300 mg total) by mouth 3 (three) times daily. (Patient not taking: Reported on 12/07/2018) 90 capsule 0  . ibuprofen (ADVIL,MOTRIN) 800 MG tablet Take 1 tablet (800 mg total) by mouth every 8 (eight) hours as needed. (Patient not taking: Reported on 12/07/2018) 90 tablet 0  . lidocaine-prilocaine (EMLA) cream Apply 1 application topically as needed. 30 g 0  . Melatonin 10 MG TABS Take by mouth at bedtime.    Marland Kitchen oxyCODONE (OXY IR/ROXICODONE) 5 MG immediate release tablet Take 1-2 tabs BID PRN pain. (Patient not taking: Reported on 12/07/2018) 60 tablet 0  . prochlorperazine (COMPAZINE) 10 MG tablet Take 1 tablet (10 mg total) by mouth every 6 (six) hours as needed for nausea or vomiting. (Patient not taking: Reported on 12/07/2018) 30 tablet 0  . simvastatin (ZOCOR) 20 MG tablet TAKE 1 TABLET(20 MG) BY MOUTH AT BEDTIME 90 tablet 3   No current facility-administered medications for this visit.     SURGICAL HISTORY:  Past Surgical History:  Procedure Laterality Date  . CYSTOSCOPY WITH RETROGRADE PYELOGRAM, URETEROSCOPY AND STENT PLACEMENT Left 12/28/2017   Procedure: CYSTOSCOPY WITH RETROGRADE PYELOGRAM, URETEROSCOPY AND STENT PLACEMENT, STONE BASKETRY;  Surgeon: Festus Aloe, MD;  Location: [facility];  Service: Urology;  Laterality: Left;  . FINGER SURGERY     index, growth removal  . HOLMIUM LASER APPLICATION Left 06/27/4817   Procedure: HOLMIUM LASER APPLICATION;  Surgeon: Festus Aloe, MD;  Location: [facility];  Service: Urology;  Laterality: Left;  . IR IMAGING GUIDED PORT INSERTION  10/31/2018  . KIDNEY STONE SURGERY  2011 or 2012  . LITHOTRIPSY  2007    REVIEW OF SYSTEMS:   Constitutional: positive for fatigue Eyes: negative Ears, nose, mouth, throat, and face: negative Respiratory: positive for dyspnea on exertion Cardiovascular: negative Gastrointestinal: negative Genitourinary:negative Integument/breast: negative Hematologic/lymphatic: negative Musculoskeletal:negative Neurological: negative Behavioral/Psych: negative Endocrine: negative Allergic/Immunologic: negative   PHYSICAL EXAMINATION: General appearance: alert, cooperative, fatigued and no distress Head: Normocephalic, without obvious abnormality, atraumatic Neck: no adenopathy, no JVD, supple, symmetrical, trachea midline and thyroid not enlarged, symmetric, no tenderness/mass/nodules Lymph nodes: Cervical, supraclavicular, and axillary nodes normal. Resp: clear to auscultation bilaterally Back: symmetric, no curvature. ROM normal. No CVA tenderness. Cardio: regular rate and rhythm, S1, S2 normal, no murmur, click, rub or gallop GI: soft, non-tender; bowel sounds normal; no masses,  no organomegaly Extremities: extremities normal, atraumatic, no cyanosis or edema Neurologic: Alert and oriented X 3, normal strength and tone. Normal symmetric reflexes. Normal coordination and gait  ECOG PERFORMANCE STATUS: 1 - Symptomatic but completely ambulatory  Blood pressure 116/70, pulse 99, temperature 98.3 F (36.8 C), temperature source Oral, resp. rate 18, height '5\' 6"'  (1.676 m), weight 142 lb 3.2 oz (64.5 kg), SpO2 97 %.  LABORATORY DATA: Lab Results  Component Value Date   WBC 12.6 (H) 12/10/2018   HGB 11.9 (L) 12/10/2018   HCT 35.7 (L) 12/10/2018   MCV 90.4 12/10/2018   PLT 425 (H) 12/10/2018      Chemistry      Component Value Date/Time   NA 140 12/10/2018 1018   K 3.5 12/10/2018 1018   CL 105 12/10/2018 1018   CO2 26 12/10/2018 1018   BUN 13 12/10/2018 1018   CREATININE 0.80 12/10/2018 1018      Component Value Date/Time   CALCIUM 9.1 12/10/2018 1018   ALKPHOS 102 12/10/2018  1018   AST 19 12/10/2018 1018   ALT 21 12/10/2018 1018   BILITOT 0.4 12/10/2018 1018       RADIOGRAPHIC STUDIES: Ct Chest W Contrast  Result Date: 12/03/2018 CLINICAL DATA:  High-grade neuroendocrine cancer diagnosed in November 2019, metastatic to bone. Restaging assessment. EXAM: CT CHEST, ABDOMEN, AND PELVIS WITH CONTRAST TECHNIQUE: Multidetector CT imaging of the chest, abdomen and pelvis was performed following the standard protocol during bolus administration of intravenous contrast. CONTRAST:  150m OMNIPAQUE IOHEXOL 300 MG/ML  SOLN COMPARISON:  Multiple exams, including CT chest from 08/21/2018 and PET-CT from 09/18/2018 FINDINGS: CT CHEST FINDINGS Cardiovascular: Right Port-A-Cath tip: SVC. There is some low-density suspicious for thrombus in the lower SVC anteriorly in the vicinity of the distal catheter, for example on image 28/2. I am skeptical that this is from venous mixing given that the azygos vein is also fully opacified. Aortic atherosclerotic calcification. Mediastinum/Nodes: Prevascular node along the left side of the aortic arch, 1.1 cm in thickness, formerly 1.0 cm. Left suprahilar node 0.8 cm in short axis, previously 0.7 cm. AP window lymph node 1.3 cm in short axis on image 23/2, formerly the same. A mass in the pericardial adipose tissues just outside of the pericardium measures 2.8 by 2.3 cm, formerly 2.0  by 1.9 cm. Lungs/Pleura: The previously hypermetabolic left upper lobe nodule measures 2.4 by 1.7 cm on image 65/4, previously 2.2 by 1.6 cm when measured in the same fashion. There continues to be a band of nodular density in the right upper lobe, which previously demonstrated only low-grade activity. Posteriorly in the superior segment right lower lobe there is a confluent but ill-defined airspace opacity for example on image 50/4 which was previously notably hypermetabolic, and which could represent some type of low-grade adenocarcinoma. This has a similar appearance to the  prior exam, measuring roughly 1.3 cm anterior-posterior on image 51/4. Peripheral interstitial accentuation in the lungs is once again identified. Emphysema noted. No new lung nodules are evident. A pleural-based enhancing nodule medially in the tenth intercostal/paraspinal space measures 2.1 by 1.0 cm on image 47/2, formerly by my measurements 1.9 by 0.9 cm. A small lesion along the pleural side of the twelfth rib measures 8 mm in thickness, formerly the same on 09/18/2018. Musculoskeletal: Left posterolateral tenth rib fracture with surrounding mass reduced in size compared to the prior exam. This mass is most reliably measured in thickness and currently measures at 1.8 cm, previously 2.8 cm. This mass in the adjacent intercostal tissues were previously markedly hypermetabolic. Indistinct and rather mild sclerosis in the T10 vertebral body posteriorly and eccentric to the right, extending into the right pedicle, appears new from 09/18/2018. CT ABDOMEN PELVIS FINDINGS Hepatobiliary: Several tiny 3 mm hypodense lesions in the liver are technically too small to characterize although statistically likely to be benign. Gallbladder unremarkable. No biliary dilatation. Pancreas: Unremarkable Spleen: Unremarkable Adrenals/Urinary Tract: Both adrenal glands appear normal. Numerous scattered cystic lesions of varying complexity are present in both kidneys. Most of these are too small to characterize, overall there dozens of lesions. Numerous bilateral nonobstructive renal calculi are present. One of the larger right-sided calculi is in the lower pole and measures 0.9 cm in long axis. One of the larger left-sided calculi is in mid kidney measures 0.5 cm in long axis. No ureteral or bladder calculus is identified. No hydronephrosis. There scattered scarring in the kidneys along with preserved fetal lobulation. Stomach/Bowel: Nondistended colon aside from the cecum. There is a transition in caliber between the cecum and the  rest of the colon which was also present on 09/18/2018, with some accentuated activity at the transition, although that may be physiologic. There is striking sigmoid colon diverticulosis. Vascular/Lymphatic: Aortoiliac atherosclerotic vascular disease. Circumaortic left renal vein. Reproductive: Small fluid density structure associated with the right adnexa, not previously hypermetabolic, potentially a cyst. Uterus unremarkable. Other: No supplemental non-categorized findings. Musculoskeletal: Unremarkable IMPRESSION: 1. Potential small amount of nonocclusive thrombus in the SVC adjacent to the distal tip of the catheter, suspicion for catheter induced thrombus. Oral anticoagulation therapy should be strongly considered. 2. Mixed appearance of the patient's malignancy, with enlargement of the pericardial nodule and left upper lobe nodule, but with some decrease in size in the soft tissue mass encasing the pathologic fracture of the left tenth rib. Overall most of the previously hypermetabolic lymph nodes and pleural metastatic lesions seen slightly increased or stable, with the only lesion significantly reduced in size the encasing mass of the left tenth rib. 3. Stable appearance of the superior segment right lower lobe sub solid nodule which was previously hypermetabolic. 4. There is new indistinct and mild sclerosis in the T10 vertebral body eccentric to the right and extending into the right pedicle. This could be from metastatic disease or a reaction to radiation  therapy in the region. Thoracic spine MRI with and without contrast be utilized for further characterization. 5. There numerous scattered cystic lesions of varying complexity in both kidneys. Most of these are too small to characterize. If clinically warranted, renal protocol MRI with and without contrast could characterize a greater percentage of these lesions, or they could simply be surveilled in the context of the patient's expected future imaging.  6. Somewhat unusual persistent configuration of caliber change in the ascending colon, with normal caliber cecum but small caliber of the colon distal to the cecum. An occult colon lesion could possibly cause this type of appearance. Correlation with the patient's colon cancer screening history is recommended. If screening is not up-to-date, appropriate screening should be considered. 7. Other imaging findings of potential clinical significance: Aortic Atherosclerosis (ICD10-I70.0) and Emphysema (ICD10-J43.9). Suspected small right adnexal cyst. Numerous bilateral nonobstructive renal calculi. These results were called by telephone at the time of interpretation on 12/03/2018 at 5:23 pm to Dr. Lindi Adie, who verbally acknowledged these results. Electronically Signed   By: Van Clines M.D.   On: 12/03/2018 17:24   Ct Abdomen Pelvis W Contrast  Result Date: 12/03/2018 CLINICAL DATA:  High-grade neuroendocrine cancer diagnosed in November 2019, metastatic to bone. Restaging assessment. EXAM: CT CHEST, ABDOMEN, AND PELVIS WITH CONTRAST TECHNIQUE: Multidetector CT imaging of the chest, abdomen and pelvis was performed following the standard protocol during bolus administration of intravenous contrast. CONTRAST:  191m OMNIPAQUE IOHEXOL 300 MG/ML  SOLN COMPARISON:  Multiple exams, including CT chest from 08/21/2018 and PET-CT from 09/18/2018 FINDINGS: CT CHEST FINDINGS Cardiovascular: Right Port-A-Cath tip: SVC. There is some low-density suspicious for thrombus in the lower SVC anteriorly in the vicinity of the distal catheter, for example on image 28/2. I am skeptical that this is from venous mixing given that the azygos vein is also fully opacified. Aortic atherosclerotic calcification. Mediastinum/Nodes: Prevascular node along the left side of the aortic arch, 1.1 cm in thickness, formerly 1.0 cm. Left suprahilar node 0.8 cm in short axis, previously 0.7 cm. AP window lymph node 1.3 cm in short axis on image  23/2, formerly the same. A mass in the pericardial adipose tissues just outside of the pericardium measures 2.8 by 2.3 cm, formerly 2.0 by 1.9 cm. Lungs/Pleura: The previously hypermetabolic left upper lobe nodule measures 2.4 by 1.7 cm on image 65/4, previously 2.2 by 1.6 cm when measured in the same fashion. There continues to be a band of nodular density in the right upper lobe, which previously demonstrated only low-grade activity. Posteriorly in the superior segment right lower lobe there is a confluent but ill-defined airspace opacity for example on image 50/4 which was previously notably hypermetabolic, and which could represent some type of low-grade adenocarcinoma. This has a similar appearance to the prior exam, measuring roughly 1.3 cm anterior-posterior on image 51/4. Peripheral interstitial accentuation in the lungs is once again identified. Emphysema noted. No new lung nodules are evident. A pleural-based enhancing nodule medially in the tenth intercostal/paraspinal space measures 2.1 by 1.0 cm on image 47/2, formerly by my measurements 1.9 by 0.9 cm. A small lesion along the pleural side of the twelfth rib measures 8 mm in thickness, formerly the same on 09/18/2018. Musculoskeletal: Left posterolateral tenth rib fracture with surrounding mass reduced in size compared to the prior exam. This mass is most reliably measured in thickness and currently measures at 1.8 cm, previously 2.8 cm. This mass in the adjacent intercostal tissues were previously markedly hypermetabolic. Indistinct and rather  mild sclerosis in the T10 vertebral body posteriorly and eccentric to the right, extending into the right pedicle, appears new from 09/18/2018. CT ABDOMEN PELVIS FINDINGS Hepatobiliary: Several tiny 3 mm hypodense lesions in the liver are technically too small to characterize although statistically likely to be benign. Gallbladder unremarkable. No biliary dilatation. Pancreas: Unremarkable Spleen: Unremarkable  Adrenals/Urinary Tract: Both adrenal glands appear normal. Numerous scattered cystic lesions of varying complexity are present in both kidneys. Most of these are too small to characterize, overall there dozens of lesions. Numerous bilateral nonobstructive renal calculi are present. One of the larger right-sided calculi is in the lower pole and measures 0.9 cm in long axis. One of the larger left-sided calculi is in mid kidney measures 0.5 cm in long axis. No ureteral or bladder calculus is identified. No hydronephrosis. There scattered scarring in the kidneys along with preserved fetal lobulation. Stomach/Bowel: Nondistended colon aside from the cecum. There is a transition in caliber between the cecum and the rest of the colon which was also present on 09/18/2018, with some accentuated activity at the transition, although that may be physiologic. There is striking sigmoid colon diverticulosis. Vascular/Lymphatic: Aortoiliac atherosclerotic vascular disease. Circumaortic left renal vein. Reproductive: Small fluid density structure associated with the right adnexa, not previously hypermetabolic, potentially a cyst. Uterus unremarkable. Other: No supplemental non-categorized findings. Musculoskeletal: Unremarkable IMPRESSION: 1. Potential small amount of nonocclusive thrombus in the SVC adjacent to the distal tip of the catheter, suspicion for catheter induced thrombus. Oral anticoagulation therapy should be strongly considered. 2. Mixed appearance of the patient's malignancy, with enlargement of the pericardial nodule and left upper lobe nodule, but with some decrease in size in the soft tissue mass encasing the pathologic fracture of the left tenth rib. Overall most of the previously hypermetabolic lymph nodes and pleural metastatic lesions seen slightly increased or stable, with the only lesion significantly reduced in size the encasing mass of the left tenth rib. 3. Stable appearance of the superior segment right  lower lobe sub solid nodule which was previously hypermetabolic. 4. There is new indistinct and mild sclerosis in the T10 vertebral body eccentric to the right and extending into the right pedicle. This could be from metastatic disease or a reaction to radiation therapy in the region. Thoracic spine MRI with and without contrast be utilized for further characterization. 5. There numerous scattered cystic lesions of varying complexity in both kidneys. Most of these are too small to characterize. If clinically warranted, renal protocol MRI with and without contrast could characterize a greater percentage of these lesions, or they could simply be surveilled in the context of the patient's expected future imaging. 6. Somewhat unusual persistent configuration of caliber change in the ascending colon, with normal caliber cecum but small caliber of the colon distal to the cecum. An occult colon lesion could possibly cause this type of appearance. Correlation with the patient's colon cancer screening history is recommended. If screening is not up-to-date, appropriate screening should be considered. 7. Other imaging findings of potential clinical significance: Aortic Atherosclerosis (ICD10-I70.0) and Emphysema (ICD10-J43.9). Suspected small right adnexal cyst. Numerous bilateral nonobstructive renal calculi. These results were called by telephone at the time of interpretation on 12/03/2018 at 5:23 pm to Dr. Lindi Adie, who verbally acknowledged these results. Electronically Signed   By: Van Clines M.D.   On: 12/03/2018 17:24    ASSESSMENT AND PLAN: This is a very pleasant 68 years old white female recently diagnosed with high-grade neuroendocrine carcinoma. The patient underwent systemic chemotherapy  with small cell regimen including carboplatin, etoposide and Tecentriq status post 2 cycles. She tolerated her treatment well but unfortunately repeat CT scan of the chest, abdomen and pelvis showed evidence for disease  progression. I had a lengthy discussion with the patient today about her current condition and treatment options. I gave the patient the option of palliative care versus consideration of first-line treatment with a non-small cell cancer regimen including carboplatin for AUC of 5, Alimta 500 mg/M2 and Keytruda 200 mg IV every 3 weeks. I discussed with the patient the adverse effect of the chemotherapy including but not limited to alopecia, myelosuppression, nausea and vomiting, peripheral neuropathy, liver or renal dysfunction in addition to the adverse effect of the immunotherapy. The patient is interested in proceeding with further treatment and she is expected to start the first cycle of this treatment next week. I will arrange for the patient to receive vitamin B12 injection today. I will also start the patient on folic acid 1 mg p.o. daily. For the suspicious nonocclusive blood clot at the tip of the Port-A-Cath, I will start the patient on Xarelto for at least 3 months.  She will start with the starter kit followed by 20 mg p.o. daily after the first 3 weeks. I will see the patient back for follow-up visit in 4 weeks for evaluation with the start of cycle #2. She was advised to call immediately if she has any concerning symptoms in the interval. The patient voices understanding of current disease status and treatment options and is in agreement with the current care plan. All questions were answered. The patient knows to call the clinic with any problems, questions or concerns. We can certainly see the patient much sooner if necessary.  Disclaimer: This note was dictated with voice recognition software. Similar sounding words can inadvertently be transcribed and may not be corrected upon review.

## 2018-12-11 ENCOUNTER — Encounter: Payer: Self-pay | Admitting: Internal Medicine

## 2018-12-11 ENCOUNTER — Ambulatory Visit: Payer: Medicare Other

## 2018-12-11 ENCOUNTER — Telehealth: Payer: Self-pay | Admitting: *Deleted

## 2018-12-11 NOTE — Telephone Encounter (Signed)
Pt called [facility] Rx for Xarelto will not be covered by [employer].  Call to [facility] for clarification. Insurance will not pay for starter pak. Pharmacist advised they will pay for 2 separate Rx. VO given. Pt notified rx will be ready for p/u.  Pt requesting to be transferred to scheduling dept as she cannot arrive for any appointments at 0730 due to insomnia. Call transferred per pt request

## 2018-12-12 ENCOUNTER — Telehealth: Payer: Self-pay | Admitting: *Deleted

## 2018-12-12 ENCOUNTER — Ambulatory Visit: Payer: Medicare Other

## 2018-12-12 NOTE — Telephone Encounter (Signed)
Pt went to pick up Xarelto Rx and her cost is approx $450.00. Pt is unable to afford this and is asking for an alternative. Will review with MD

## 2018-12-13 ENCOUNTER — Telehealth: Payer: Self-pay | Admitting: Internal Medicine

## 2018-12-13 ENCOUNTER — Other Ambulatory Visit: Payer: Self-pay | Admitting: *Deleted

## 2018-12-13 NOTE — Telephone Encounter (Signed)
Scheduled appt per 1/27 los - pt is aware of apt date and time

## 2018-12-13 NOTE — Telephone Encounter (Signed)
We can try Lovenox but I am sure it will be expensive too.

## 2018-12-13 NOTE — Telephone Encounter (Signed)
Spoke with pt pharmacy, the cost of the Blood thinner is going to be high ( approx. 500$) because pt has not met her deductible for the year.  Changing medications will not change the co-pay/ deductible amount. Pt needs to pay until Full deductible for insurance has been paid. Pt advised she will reach out to the pharmacy and insurance co to discuss if there are other options/medicaiton and clarity regarding the cost. No further concerns at this time.

## 2018-12-14 ENCOUNTER — Telehealth: Payer: Self-pay | Admitting: *Deleted

## 2018-12-14 ENCOUNTER — Ambulatory Visit: Payer: Medicare Other

## 2018-12-14 NOTE — Telephone Encounter (Signed)
[name] patient regarding her concerns about the cost of Xarelto.Marland Kitchen Spoke with patient and informed her of program with [employer]-their savings program.  I gave her the toll free # to call as well as leaving a patient assistance card for [employer] at the front desk of the [employer]  This benefit does help people with commercial insurance.  Pt expressed her gratitude and will come by to pick the the card.

## 2018-12-17 ENCOUNTER — Ambulatory Visit: Payer: Medicare Other

## 2018-12-18 ENCOUNTER — Other Ambulatory Visit: Payer: Self-pay | Admitting: Internal Medicine

## 2018-12-18 ENCOUNTER — Telehealth: Payer: Self-pay | Admitting: Medical Oncology

## 2018-12-18 NOTE — Telephone Encounter (Signed)
xarelto not covered . Med in review and pt asking pharmacy/insurance if Xarelto  can be moved to a tier 2 drug.

## 2018-12-20 ENCOUNTER — Inpatient Hospital Stay: Payer: Medicare Other

## 2018-12-20 ENCOUNTER — Other Ambulatory Visit: Payer: Self-pay | Admitting: Medical Oncology

## 2018-12-20 ENCOUNTER — Telehealth: Payer: Self-pay | Admitting: Medical Oncology

## 2018-12-20 ENCOUNTER — Inpatient Hospital Stay: Payer: Medicare Other | Attending: Internal Medicine

## 2018-12-20 VITALS — BP 108/71 | HR 81 | Temp 98.3°F | Resp 16

## 2018-12-20 DIAGNOSIS — M8580 Other specified disorders of bone density and structure, unspecified site: Secondary | ICD-10-CM | POA: Diagnosis not present

## 2018-12-20 DIAGNOSIS — M545 Low back pain: Secondary | ICD-10-CM | POA: Diagnosis not present

## 2018-12-20 DIAGNOSIS — R5383 Other fatigue: Secondary | ICD-10-CM | POA: Diagnosis not present

## 2018-12-20 DIAGNOSIS — G8929 Other chronic pain: Secondary | ICD-10-CM | POA: Insufficient documentation

## 2018-12-20 DIAGNOSIS — Z5111 Encounter for antineoplastic chemotherapy: Secondary | ICD-10-CM | POA: Diagnosis not present

## 2018-12-20 DIAGNOSIS — C7B8 Other secondary neuroendocrine tumors: Secondary | ICD-10-CM | POA: Diagnosis not present

## 2018-12-20 DIAGNOSIS — Z7901 Long term (current) use of anticoagulants: Secondary | ICD-10-CM | POA: Diagnosis not present

## 2018-12-20 DIAGNOSIS — Z95828 Presence of other vascular implants and grafts: Secondary | ICD-10-CM | POA: Insufficient documentation

## 2018-12-20 DIAGNOSIS — Z923 Personal history of irradiation: Secondary | ICD-10-CM | POA: Diagnosis not present

## 2018-12-20 DIAGNOSIS — Z5112 Encounter for antineoplastic immunotherapy: Secondary | ICD-10-CM | POA: Insufficient documentation

## 2018-12-20 DIAGNOSIS — N189 Chronic kidney disease, unspecified: Secondary | ICD-10-CM | POA: Diagnosis not present

## 2018-12-20 DIAGNOSIS — C7A1 Malignant poorly differentiated neuroendocrine tumors: Secondary | ICD-10-CM | POA: Insufficient documentation

## 2018-12-20 DIAGNOSIS — C3492 Malignant neoplasm of unspecified part of left bronchus or lung: Secondary | ICD-10-CM

## 2018-12-20 DIAGNOSIS — Z79899 Other long term (current) drug therapy: Secondary | ICD-10-CM | POA: Insufficient documentation

## 2018-12-20 LAB — CBC WITH DIFFERENTIAL (CANCER CENTER ONLY)
Abs Immature Granulocytes: 0.02 10*3/uL (ref 0.00–0.07)
BASOS ABS: 0.1 10*3/uL (ref 0.0–0.1)
Basophils Relative: 1 %
Eosinophils Absolute: 0.1 10*3/uL (ref 0.0–0.5)
Eosinophils Relative: 2 %
HCT: 37.3 % (ref 36.0–46.0)
Hemoglobin: 12 g/dL (ref 12.0–15.0)
Immature Granulocytes: 0 %
Lymphocytes Relative: 21 %
Lymphs Abs: 1.5 10*3/uL (ref 0.7–4.0)
MCH: 30.2 pg (ref 26.0–34.0)
MCHC: 32.2 g/dL (ref 30.0–36.0)
MCV: 94 fL (ref 80.0–100.0)
Monocytes Absolute: 1 10*3/uL (ref 0.1–1.0)
Monocytes Relative: 14 %
Neutro Abs: 4.5 10*3/uL (ref 1.7–7.7)
Neutrophils Relative %: 62 %
PLATELETS: 394 10*3/uL (ref 150–400)
RBC: 3.97 MIL/uL (ref 3.87–5.11)
RDW: 16.3 % — ABNORMAL HIGH (ref 11.5–15.5)
WBC Count: 7.1 10*3/uL (ref 4.0–10.5)
nRBC: 0 % (ref 0.0–0.2)

## 2018-12-20 LAB — CMP (CANCER CENTER ONLY)
ALBUMIN: 3.2 g/dL — AB (ref 3.5–5.0)
ALT: 15 U/L (ref 0–44)
ANION GAP: 8 (ref 5–15)
AST: 22 U/L (ref 15–41)
Alkaline Phosphatase: 93 U/L (ref 38–126)
BUN: 14 mg/dL (ref 8–23)
CO2: 27 mmol/L (ref 22–32)
Calcium: 8.8 mg/dL — ABNORMAL LOW (ref 8.9–10.3)
Chloride: 107 mmol/L (ref 98–111)
Creatinine: 0.73 mg/dL (ref 0.44–1.00)
GFR, Est AFR Am: >60 mL/min (ref >60)
GFR, Estimated: >60 mL/min (ref >60)
Glucose, Bld: 98 mg/dL (ref 70–99)
Potassium: 3.6 mmol/L (ref 3.5–5.1)
Sodium: 142 mmol/L (ref 135–145)
Total Bilirubin: 0.6 mg/dL (ref 0.3–1.2)
Total Protein: 6.6 g/dL (ref 6.5–8.1)

## 2018-12-20 MED ORDER — PALONOSETRON HCL INJECTION 0.25 MG/5ML
0.2500 mg | Freq: Once | INTRAVENOUS | Status: AC
  Administered 2018-12-20: 0.25 mg via INTRAVENOUS

## 2018-12-20 MED ORDER — SODIUM CHLORIDE 0.9 % IV SOLN
403.0000 mg | Freq: Once | INTRAVENOUS | Status: AC
  Administered 2018-12-20: 400 mg via INTRAVENOUS
  Filled 2018-12-20: qty 40

## 2018-12-20 MED ORDER — SODIUM CHLORIDE 0.9 % IV SOLN
Freq: Once | INTRAVENOUS | Status: AC
  Administered 2018-12-20: 13:00:00 via INTRAVENOUS
  Filled 2018-12-20: qty 250

## 2018-12-20 MED ORDER — SODIUM CHLORIDE 0.9 % IV SOLN
200.0000 mg | Freq: Once | INTRAVENOUS | Status: AC
  Administered 2018-12-20: 200 mg via INTRAVENOUS
  Filled 2018-12-20: qty 8

## 2018-12-20 MED ORDER — PALONOSETRON HCL INJECTION 0.25 MG/5ML
INTRAVENOUS | Status: AC
  Filled 2018-12-20: qty 5

## 2018-12-20 MED ORDER — SODIUM CHLORIDE 0.9 % IV SOLN
Freq: Once | INTRAVENOUS | Status: AC
  Administered 2018-12-20: 14:00:00 via INTRAVENOUS
  Filled 2018-12-20: qty 5

## 2018-12-20 MED ORDER — SODIUM CHLORIDE 0.9 % IV SOLN
515.0000 mg/m2 | Freq: Once | INTRAVENOUS | Status: AC
  Administered 2018-12-20: 900 mg via INTRAVENOUS
  Filled 2018-12-20: qty 20

## 2018-12-20 MED ORDER — SODIUM CHLORIDE 0.9% FLUSH
10.0000 mL | INTRAVENOUS | Status: DC | PRN
  Administered 2018-12-20: 10 mL
  Filled 2018-12-20: qty 10

## 2018-12-20 MED ORDER — HEPARIN SOD (PORK) LOCK FLUSH 100 UNIT/ML IV SOLN
500.0000 [IU] | Freq: Once | INTRAVENOUS | Status: AC | PRN
  Administered 2018-12-20: 500 [IU]
  Filled 2018-12-20: qty 5

## 2018-12-20 MED ORDER — SODIUM CHLORIDE 0.9% FLUSH
10.0000 mL | Freq: Once | INTRAVENOUS | Status: AC
  Administered 2018-12-20: 10 mL
  Filled 2018-12-20: qty 10

## 2018-12-20 NOTE — Progress Notes (Signed)
Pt. Tolerated first dose of Alimta. No complaints.

## 2018-12-20 NOTE — Telephone Encounter (Signed)
Appeal denied for coverage of xarelto.

## 2018-12-21 ENCOUNTER — Other Ambulatory Visit: Payer: Self-pay | Admitting: Internal Medicine

## 2018-12-21 ENCOUNTER — Other Ambulatory Visit: Payer: Self-pay | Admitting: *Deleted

## 2018-12-21 ENCOUNTER — Telehealth: Payer: Self-pay | Admitting: *Deleted

## 2018-12-21 DIAGNOSIS — T85818D Embolism due to other internal prosthetic devices, implants and grafts, subsequent encounter: Secondary | ICD-10-CM

## 2018-12-21 MED ORDER — WARFARIN SODIUM 5 MG PO TABS: 5.0000 mg | ORAL_TABLET | Freq: Every day | ORAL | 1 refills | Status: DC

## 2018-12-21 NOTE — Telephone Encounter (Signed)
[name] patient to advise her that Tier exception for the Xarelto was denied.  Spoke with her and informed her of denial.. Dr. Julien Nordmann ordered pt to start on Coumadin 5 mg daily with weekly PT/INR. Education provided to patient re: need for weekly labs, dietary restrictions and what to be aware of while on Coumadin.  Pt voiced understanding. Prescription escribed to her pharmacy. Xarelto has been discontinued. Lab orders entered. For weekly PT/INR

## 2018-12-21 NOTE — Telephone Encounter (Signed)
Lets start her on coumadin 5 mg po qd. Repeat PT/INR with weekly lab.

## 2018-12-26 ENCOUNTER — Inpatient Hospital Stay: Payer: Medicare Other

## 2018-12-26 DIAGNOSIS — C7A1 Malignant poorly differentiated neuroendocrine tumors: Secondary | ICD-10-CM | POA: Diagnosis not present

## 2018-12-26 DIAGNOSIS — C7B8 Other secondary neuroendocrine tumors: Secondary | ICD-10-CM | POA: Diagnosis not present

## 2018-12-26 DIAGNOSIS — G8929 Other chronic pain: Secondary | ICD-10-CM | POA: Diagnosis not present

## 2018-12-26 DIAGNOSIS — M545 Low back pain: Secondary | ICD-10-CM | POA: Diagnosis not present

## 2018-12-26 DIAGNOSIS — Z5111 Encounter for antineoplastic chemotherapy: Secondary | ICD-10-CM | POA: Diagnosis not present

## 2018-12-26 DIAGNOSIS — Z5112 Encounter for antineoplastic immunotherapy: Secondary | ICD-10-CM | POA: Diagnosis not present

## 2018-12-26 DIAGNOSIS — C3492 Malignant neoplasm of unspecified part of left bronchus or lung: Secondary | ICD-10-CM

## 2018-12-26 DIAGNOSIS — T85818D Embolism due to other internal prosthetic devices, implants and grafts, subsequent encounter: Secondary | ICD-10-CM

## 2018-12-26 LAB — CBC WITH DIFFERENTIAL (CANCER CENTER ONLY)
Abs Immature Granulocytes: 0.02 10*3/uL (ref 0.00–0.07)
Basophils Absolute: 0 10*3/uL (ref 0.0–0.1)
Basophils Relative: 1 %
EOS PCT: 5 %
Eosinophils Absolute: 0.3 10*3/uL (ref 0.0–0.5)
HCT: 39.1 % (ref 36.0–46.0)
Hemoglobin: 13.1 g/dL (ref 12.0–15.0)
Immature Granulocytes: 0 %
Lymphocytes Relative: 20 %
Lymphs Abs: 1.3 10*3/uL (ref 0.7–4.0)
MCH: 30.7 pg (ref 26.0–34.0)
MCHC: 33.5 g/dL (ref 30.0–36.0)
MCV: 91.6 fL (ref 80.0–100.0)
Monocytes Absolute: 0.1 10*3/uL (ref 0.1–1.0)
Monocytes Relative: 2 %
Neutro Abs: 4.8 10*3/uL (ref 1.7–7.7)
Neutrophils Relative %: 72 %
Platelet Count: 259 10*3/uL (ref 150–400)
RBC: 4.27 MIL/uL (ref 3.87–5.11)
RDW: 15.3 % (ref 11.5–15.5)
WBC Count: 6.6 10*3/uL (ref 4.0–10.5)
nRBC: 0 % (ref 0.0–0.2)

## 2018-12-26 LAB — CMP (CANCER CENTER ONLY)
ALT: 20 U/L (ref 0–44)
AST: 22 U/L (ref 15–41)
Albumin: 3.4 g/dL — ABNORMAL LOW (ref 3.5–5.0)
Alkaline Phosphatase: 84 U/L (ref 38–126)
Anion gap: 9 (ref 5–15)
BUN: 19 mg/dL (ref 8–23)
CO2: 29 mmol/L (ref 22–32)
Calcium: 8.8 mg/dL — ABNORMAL LOW (ref 8.9–10.3)
Chloride: 103 mmol/L (ref 98–111)
Creatinine: 0.77 mg/dL (ref 0.44–1.00)
GFR, Est AFR Am: >60 mL/min (ref >60)
Glucose, Bld: 105 mg/dL — ABNORMAL HIGH (ref 70–99)
Potassium: 4 mmol/L (ref 3.5–5.1)
Sodium: 141 mmol/L (ref 135–145)
Total Bilirubin: 0.6 mg/dL (ref 0.3–1.2)
Total Protein: 6.8 g/dL (ref 6.5–8.1)

## 2018-12-26 LAB — PROTIME-INR
INR: 2.34
Prothrombin Time: 25.3 seconds — ABNORMAL HIGH (ref 11.4–15.2)

## 2018-12-27 ENCOUNTER — Telehealth: Payer: Self-pay | Admitting: *Deleted

## 2018-12-27 NOTE — Telephone Encounter (Signed)
Pt called states " I have right sided back pain x 1 week, taking 1 oxy5mg  IR at night,. I try not to take them during the day. I might take 1. This is the same pain I had on the left side that brought me into see Dr. Julien Nordmann. I'm concerned there is a tumor on the right side." Pt denied nausea, vomitting, diarrhea, headache, incontinence, no burning or pain with urination, denied fever, chills night sweats, constipation last BM 2/12. Reviewed with MD advised pt to continue taking pain medication as prescribed and we will continue to monitor. If the pain worsens or pain medication is not effective pt to call office. Pt verbalized understanding. No further concerns.

## 2018-12-31 ENCOUNTER — Ambulatory Visit: Payer: Medicare Other

## 2018-12-31 ENCOUNTER — Other Ambulatory Visit: Payer: Medicare Other

## 2018-12-31 ENCOUNTER — Ambulatory Visit: Payer: Medicare Other | Admitting: Internal Medicine

## 2019-01-01 ENCOUNTER — Ambulatory Visit: Payer: Medicare Other

## 2019-01-02 ENCOUNTER — Inpatient Hospital Stay: Payer: Medicare Other

## 2019-01-02 ENCOUNTER — Ambulatory Visit: Payer: Medicare Other

## 2019-01-02 DIAGNOSIS — C3492 Malignant neoplasm of unspecified part of left bronchus or lung: Secondary | ICD-10-CM

## 2019-01-02 DIAGNOSIS — Z5112 Encounter for antineoplastic immunotherapy: Secondary | ICD-10-CM | POA: Diagnosis not present

## 2019-01-02 DIAGNOSIS — C7B8 Other secondary neuroendocrine tumors: Secondary | ICD-10-CM | POA: Diagnosis not present

## 2019-01-02 DIAGNOSIS — M545 Low back pain: Secondary | ICD-10-CM | POA: Diagnosis not present

## 2019-01-02 DIAGNOSIS — T85818D Embolism due to other internal prosthetic devices, implants and grafts, subsequent encounter: Secondary | ICD-10-CM

## 2019-01-02 DIAGNOSIS — C7A1 Malignant poorly differentiated neuroendocrine tumors: Secondary | ICD-10-CM | POA: Diagnosis not present

## 2019-01-02 DIAGNOSIS — G8929 Other chronic pain: Secondary | ICD-10-CM | POA: Diagnosis not present

## 2019-01-02 DIAGNOSIS — R5382 Chronic fatigue, unspecified: Secondary | ICD-10-CM

## 2019-01-02 DIAGNOSIS — Z5111 Encounter for antineoplastic chemotherapy: Secondary | ICD-10-CM | POA: Diagnosis not present

## 2019-01-02 LAB — CBC WITH DIFFERENTIAL (CANCER CENTER ONLY)
Abs Immature Granulocytes: 0.01 10*3/uL (ref 0.00–0.07)
Basophils Absolute: 0 10*3/uL (ref 0.0–0.1)
Basophils Relative: 0 %
EOS ABS: 0.2 10*3/uL (ref 0.0–0.5)
Eosinophils Relative: 4 %
HCT: 33.7 % — ABNORMAL LOW (ref 36.0–46.0)
Hemoglobin: 11.1 g/dL — ABNORMAL LOW (ref 12.0–15.0)
Immature Granulocytes: 0 %
LYMPHS ABS: 1 10*3/uL (ref 0.7–4.0)
Lymphocytes Relative: 27 %
MCH: 30.5 pg (ref 26.0–34.0)
MCHC: 32.9 g/dL (ref 30.0–36.0)
MCV: 92.6 fL (ref 80.0–100.0)
Monocytes Absolute: 0.4 10*3/uL (ref 0.1–1.0)
Monocytes Relative: 10 %
Neutro Abs: 2.2 10*3/uL (ref 1.7–7.7)
Neutrophils Relative %: 59 %
Platelet Count: 78 10*3/uL — ABNORMAL LOW (ref 150–400)
RBC: 3.64 MIL/uL — ABNORMAL LOW (ref 3.87–5.11)
RDW: 15.5 % (ref 11.5–15.5)
WBC Count: 3.7 10*3/uL — ABNORMAL LOW (ref 4.0–10.5)
nRBC: 0 % (ref 0.0–0.2)

## 2019-01-02 LAB — PROTIME-INR
INR: 3.61
Prothrombin Time: 35.4 seconds — ABNORMAL HIGH (ref 11.4–15.2)

## 2019-01-02 LAB — CMP (CANCER CENTER ONLY)
ALT: 34 U/L (ref 0–44)
AST: 30 U/L (ref 15–41)
Albumin: 3.1 g/dL — ABNORMAL LOW (ref 3.5–5.0)
Alkaline Phosphatase: 97 U/L (ref 38–126)
Anion gap: 9 (ref 5–15)
BUN: 16 mg/dL (ref 8–23)
CO2: 25 mmol/L (ref 22–32)
Calcium: 8.5 mg/dL — ABNORMAL LOW (ref 8.9–10.3)
Chloride: 108 mmol/L (ref 98–111)
Creatinine: 0.71 mg/dL (ref 0.44–1.00)
GFR, Est AFR Am: >60 mL/min (ref >60)
GFR, Estimated: >60 mL/min (ref >60)
Glucose, Bld: 143 mg/dL — ABNORMAL HIGH (ref 70–99)
Potassium: 3.5 mmol/L (ref 3.5–5.1)
SODIUM: 142 mmol/L (ref 135–145)
Total Bilirubin: 0.6 mg/dL (ref 0.3–1.2)
Total Protein: 6.5 g/dL (ref 6.5–8.1)

## 2019-01-02 LAB — TSH: TSH: 0.196 u[IU]/mL — AB (ref 0.308–3.960)

## 2019-01-04 ENCOUNTER — Encounter: Payer: Self-pay | Admitting: Internal Medicine

## 2019-01-07 ENCOUNTER — Telehealth: Payer: Self-pay | Admitting: Medical Oncology

## 2019-01-07 ENCOUNTER — Other Ambulatory Visit: Payer: Self-pay | Admitting: Internal Medicine

## 2019-01-07 DIAGNOSIS — C7951 Secondary malignant neoplasm of bone: Secondary | ICD-10-CM

## 2019-01-07 MED ORDER — OXYCODONE HCL 5 MG PO TABS: 5.0000 mg | ORAL_TABLET | Freq: Four times a day (QID) | ORAL | 0 refills | Status: DC | PRN

## 2019-01-07 NOTE — Telephone Encounter (Signed)
Refill oxycodone - Continues with back pain. She  is taking gabapentin also but requests narcotic refill.

## 2019-01-07 NOTE — Telephone Encounter (Signed)
I told pt that Alison Sullivan will be sending in prescription tonight.

## 2019-01-08 ENCOUNTER — Encounter: Payer: Self-pay | Admitting: Medical Oncology

## 2019-01-09 ENCOUNTER — Other Ambulatory Visit: Payer: Medicare Other

## 2019-01-09 ENCOUNTER — Ambulatory Visit: Payer: Medicare Other

## 2019-01-10 ENCOUNTER — Other Ambulatory Visit: Payer: Self-pay

## 2019-01-10 ENCOUNTER — Inpatient Hospital Stay: Payer: Medicare Other

## 2019-01-10 ENCOUNTER — Inpatient Hospital Stay (HOSPITAL_BASED_OUTPATIENT_CLINIC_OR_DEPARTMENT_OTHER): Payer: Medicare Other | Admitting: Physician Assistant

## 2019-01-10 ENCOUNTER — Encounter: Payer: Self-pay | Admitting: Physician Assistant

## 2019-01-10 ENCOUNTER — Telehealth: Payer: Self-pay | Admitting: Physician Assistant

## 2019-01-10 VITALS — BP 101/68 | HR 80 | Temp 98.3°F | Resp 20 | Ht 66.0 in | Wt 145.4 lb

## 2019-01-10 DIAGNOSIS — Z923 Personal history of irradiation: Secondary | ICD-10-CM

## 2019-01-10 DIAGNOSIS — Z7901 Long term (current) use of anticoagulants: Secondary | ICD-10-CM | POA: Insufficient documentation

## 2019-01-10 DIAGNOSIS — T85818D Embolism due to other internal prosthetic devices, implants and grafts, subsequent encounter: Secondary | ICD-10-CM

## 2019-01-10 DIAGNOSIS — R5383 Other fatigue: Secondary | ICD-10-CM | POA: Diagnosis not present

## 2019-01-10 DIAGNOSIS — N189 Chronic kidney disease, unspecified: Secondary | ICD-10-CM | POA: Diagnosis not present

## 2019-01-10 DIAGNOSIS — C7A1 Malignant poorly differentiated neuroendocrine tumors: Secondary | ICD-10-CM

## 2019-01-10 DIAGNOSIS — Z5112 Encounter for antineoplastic immunotherapy: Secondary | ICD-10-CM

## 2019-01-10 DIAGNOSIS — C3492 Malignant neoplasm of unspecified part of left bronchus or lung: Secondary | ICD-10-CM

## 2019-01-10 DIAGNOSIS — G8929 Other chronic pain: Secondary | ICD-10-CM

## 2019-01-10 DIAGNOSIS — Z5111 Encounter for antineoplastic chemotherapy: Secondary | ICD-10-CM

## 2019-01-10 DIAGNOSIS — C7B8 Other secondary neuroendocrine tumors: Secondary | ICD-10-CM | POA: Diagnosis not present

## 2019-01-10 DIAGNOSIS — M545 Low back pain: Secondary | ICD-10-CM | POA: Diagnosis not present

## 2019-01-10 DIAGNOSIS — Z79899 Other long term (current) drug therapy: Secondary | ICD-10-CM | POA: Diagnosis not present

## 2019-01-10 LAB — CMP (CANCER CENTER ONLY)
ALBUMIN: 3.3 g/dL — AB (ref 3.5–5.0)
ALT: 35 U/L (ref 0–44)
AST: 36 U/L (ref 15–41)
Alkaline Phosphatase: 115 U/L (ref 38–126)
Anion gap: 11 (ref 5–15)
BILIRUBIN TOTAL: 0.3 mg/dL (ref 0.3–1.2)
BUN: 12 mg/dL (ref 8–23)
CO2: 25 mmol/L (ref 22–32)
Calcium: 8.9 mg/dL (ref 8.9–10.3)
Chloride: 105 mmol/L (ref 98–111)
Creatinine: 0.79 mg/dL (ref 0.44–1.00)
GFR, Est AFR Am: >60 mL/min (ref >60)
GLUCOSE: 121 mg/dL — AB (ref 70–99)
Potassium: 3.8 mmol/L (ref 3.5–5.1)
Sodium: 141 mmol/L (ref 135–145)
Total Protein: 6.9 g/dL (ref 6.5–8.1)

## 2019-01-10 LAB — CBC WITH DIFFERENTIAL (CANCER CENTER ONLY)
ABS IMMATURE GRANULOCYTES: 0.05 10*3/uL (ref 0.00–0.07)
Basophils Absolute: 0 10*3/uL (ref 0.0–0.1)
Basophils Relative: 0 %
Eosinophils Absolute: 0.2 10*3/uL (ref 0.0–0.5)
Eosinophils Relative: 3 %
HCT: 36.4 % (ref 36.0–46.0)
Hemoglobin: 12 g/dL (ref 12.0–15.0)
Immature Granulocytes: 1 %
Lymphocytes Relative: 23 %
Lymphs Abs: 1.2 10*3/uL (ref 0.7–4.0)
MCH: 31.3 pg (ref 26.0–34.0)
MCHC: 33 g/dL (ref 30.0–36.0)
MCV: 95 fL (ref 80.0–100.0)
Monocytes Absolute: 0.7 10*3/uL (ref 0.1–1.0)
Monocytes Relative: 13 %
Neutro Abs: 3.1 10*3/uL (ref 1.7–7.7)
Neutrophils Relative %: 60 %
Platelet Count: 336 10*3/uL (ref 150–400)
RBC: 3.83 MIL/uL — ABNORMAL LOW (ref 3.87–5.11)
RDW: 18 % — ABNORMAL HIGH (ref 11.5–15.5)
WBC: 5.3 10*3/uL (ref 4.0–10.5)
nRBC: 0 % (ref 0.0–0.2)

## 2019-01-10 MED ORDER — SODIUM CHLORIDE 0.9 % IV SOLN
403.0000 mg | Freq: Once | INTRAVENOUS | Status: AC
  Administered 2019-01-10: 400 mg via INTRAVENOUS
  Filled 2019-01-10: qty 40

## 2019-01-10 MED ORDER — SODIUM CHLORIDE 0.9 % IV SOLN
200.0000 mg | Freq: Once | INTRAVENOUS | Status: AC
  Administered 2019-01-10: 200 mg via INTRAVENOUS
  Filled 2019-01-10: qty 8

## 2019-01-10 MED ORDER — HEPARIN SOD (PORK) LOCK FLUSH 100 UNIT/ML IV SOLN
500.0000 [IU] | Freq: Once | INTRAVENOUS | Status: DC | PRN
  Filled 2019-01-10: qty 5

## 2019-01-10 MED ORDER — PALONOSETRON HCL INJECTION 0.25 MG/5ML
INTRAVENOUS | Status: AC
  Filled 2019-01-10: qty 5

## 2019-01-10 MED ORDER — PALONOSETRON HCL INJECTION 0.25 MG/5ML
0.2500 mg | Freq: Once | INTRAVENOUS | Status: AC
  Administered 2019-01-10: 0.25 mg via INTRAVENOUS

## 2019-01-10 MED ORDER — SODIUM CHLORIDE 0.9 % IV SOLN
Freq: Once | INTRAVENOUS | Status: AC
  Administered 2019-01-10: 13:00:00 via INTRAVENOUS
  Filled 2019-01-10: qty 250

## 2019-01-10 MED ORDER — SODIUM CHLORIDE 0.9 % IV SOLN
515.0000 mg/m2 | Freq: Once | INTRAVENOUS | Status: AC
  Administered 2019-01-10: 900 mg via INTRAVENOUS
  Filled 2019-01-10: qty 20

## 2019-01-10 MED ORDER — SODIUM CHLORIDE 0.9% FLUSH
10.0000 mL | INTRAVENOUS | Status: DC | PRN
  Filled 2019-01-10: qty 10

## 2019-01-10 MED ORDER — SODIUM CHLORIDE 0.9 % IV SOLN
Freq: Once | INTRAVENOUS | Status: AC
  Administered 2019-01-10: 13:00:00 via INTRAVENOUS
  Filled 2019-01-10: qty 5

## 2019-01-10 MED ORDER — SODIUM CHLORIDE 0.9% FLUSH
10.0000 mL | Freq: Once | INTRAVENOUS | Status: AC
  Administered 2019-01-10: 10 mL
  Filled 2019-01-10: qty 10

## 2019-01-10 NOTE — Progress Notes (Signed)
[facility] OFFICE PROGRESS NOTE  [name], MD [place] [address]  DIAGNOSIS: Stage IV (T1b,N2, 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 She presented with left upper lobe lung nodule in addition to mediastinal lymphadenopathy as well as right upper lobe as well as metastatic bone disease to the sixth and 10th rib. The patient has significant pain from her metastatic rib lesions.  PRIOR THERAPY: Systemic chemotherapy with carboplatin for AUC of 5 on day 1, etoposide 100 mg/M2 on days 1, 2 and 3 as well as Tecentriq 1200 mg IV every 3 weeks with Neulasta support.  Status post 2 cycles, discontinued secondary to disease progression.  CURRENT THERAPY:  1) Systemic chemotherapy with carboplatin for AUC of 5, Alimta 500 mg/M2 and Keytruda 200 mg IV every 3 weeks. First dose December 17, 2018. Status post 1 cycle. 2) Coumadin 5 mg p.o. First dose 12/21/2018.  Dose reduced to 2.5 mg p.o daily on 01/13/2019   INTERVAL HISTORY: Alison Sullivan 68 y.o. female returns for the clinic for follow-up visit.  This patient is status post cycle #1 she tolerated her first treatment well the exception of mild fatigue.  She also notes that she is taking her folic acid as prescribed.  During her last visit she was found to have a nonocclusive clot at the tip of her Port-A-Cath.  Due to difficulties with her insurance coverage, she was placed on 5 mg p.o. daily of Coumadin.  Patient's PT/INR is monitored with weekly labs.  She is tolerating it well and denies any epistaxis hematuria, melena, easy bruising, or gingival bleeding.   The patient is feeling well today but continues to have significant pain in the right paravertebral region of her back. She states that this pain has been going on for approximately 6 weeks but believes that the pain has gradually worsened. The pain is intermittent but worse at  nighttime according to the patient and exacerbated with movement. She rates her pain a 10/10 at its worst. For pain management she takes 1 gabapentin and two 5 mg oxycodones which successfully alleviates her pain.  She states that she takes pain medicine approximately every 12 hours or so.  She denies any associated traumas or injuries.  She reports fatigue but denies any fevers, chills, night sweats, or weight loss.  She denies any nausea, vomiting, diarrhea, or constipation.  She denies any rashes or skin changes.  Headaches or visual changes. She reports dyspnea on exertion but denies any chest pain, cough, or hemoptysis.  She is here today for evaluation prior to starting cycle #2.   MEDICAL HISTORY: Past Medical History:  Diagnosis Date  . Arthritis   . Chronic kidney disease   . Dog bite of wrist   . History of kidney stones   . History of radiation therapy 10/23/18- 11/06/18   Left lower rib mass/ 30 Gy delivered in 10 fractions 3 Gy.   . Kidney infection   . Left renal mass   . Osteopenia   . Pyelonephritis   . Recurrent UTI (urinary tract infection)     ALLERGIES:  has No Known Allergies.  MEDICATIONS:  Current Outpatient Medications  Medication Sig Dispense Refill  . folic acid (FOLVITE) 1 MG tablet Take 1 tablet (1 mg total) by mouth daily. 30 tablet 4  . gabapentin (NEURONTIN) 300 MG capsule Take 1 capsule (300 mg total) by mouth 3 (three) times daily. Thornton  capsule 0  . lidocaine-prilocaine (EMLA) cream Apply 1 application topically as needed. 30 g 0  . Melatonin 10 MG TABS Take by mouth at bedtime.    Marland Kitchen oxyCODONE (OXY IR/ROXICODONE) 5 MG immediate release tablet Take 1 tablet (5 mg total) by mouth every 6 (six) hours as needed for severe pain. 60 tablet 0  . simvastatin (ZOCOR) 20 MG tablet TAKE 1 TABLET(20 MG) BY MOUTH AT BEDTIME 90 tablet 3  . warfarin (COUMADIN) 5 MG tablet TAKE 1 TABLET(5 MG) BY MOUTH DAILY 90 tablet 1  . ibuprofen (ADVIL,MOTRIN) 800 MG tablet Take 1  tablet (800 mg total) by mouth every 8 (eight) hours as needed. (Patient not taking: Reported on 01/10/2019) 90 tablet 0  . prochlorperazine (COMPAZINE) 10 MG tablet Take 1 tablet (10 mg total) by mouth every 6 (six) hours as needed for nausea or vomiting. (Patient not taking: Reported on 12/07/2018) 30 tablet 0   No current facility-administered medications for this visit.    Facility-Administered Medications Ordered in Other Visits  Medication Dose Route Frequency Provider Last Rate Last Dose  . heparin lock flush 100 unit/mL  500 Units Intracatheter Once PRN Curt Bears, MD      . sodium chloride flush (NS) 0.9 % injection 10 mL  10 mL Intracatheter PRN Curt Bears, MD        SURGICAL HISTORY:  Past Surgical History:  Procedure Laterality Date  . CYSTOSCOPY WITH RETROGRADE PYELOGRAM, URETEROSCOPY AND STENT PLACEMENT Left 12/28/2017   Procedure: CYSTOSCOPY WITH RETROGRADE PYELOGRAM, URETEROSCOPY AND STENT PLACEMENT, STONE BASKETRY;  Surgeon: Festus Aloe, MD;  Location: [facility];  Service: Urology;  Laterality: Left;  . FINGER SURGERY     index, growth removal  . HOLMIUM LASER APPLICATION Left 08/04/1940   Procedure: HOLMIUM LASER APPLICATION;  Surgeon: Festus Aloe, MD;  Location: [facility];  Service: Urology;  Laterality: Left;  . IR IMAGING GUIDED PORT INSERTION  10/31/2018  . KIDNEY STONE SURGERY  2011 or 2012  . LITHOTRIPSY  2007    REVIEW OF SYSTEMS:   Review of Systems  Constitutional: Positive for fatigue. Negative for appetite change, chills, fever and unexpected weight change.  HENT: Negative for mouth sores, nosebleeds, sore throat and trouble swallowing.   Eyes: Negative for eye problems and icterus.  Respiratory: Negative for cough, hemoptysis, shortness of breath and wheezing.   Cardiovascular: Negative for chest pain and leg swelling.  Gastrointestinal: Negative for abdominal pain, constipation, diarrhea, nausea and  vomiting.  Genitourinary: Negative for bladder incontinence, difficulty urinating, dysuria, frequency and hematuria.   Musculoskeletal: Positive for paravertebral back pain. Negative for gait problem, neck pain and neck stiffness.  Skin: Negative for itching and rash.  Neurological: Negative for dizziness, extremity weakness, gait problem, headaches, light-headedness and seizures.  Hematological: Negative for adenopathy. Does not bruise/bleed easily.  Psychiatric/Behavioral: Negative for confusion, depression and sleep disturbance. The patient is not nervous/anxious.     PHYSICAL EXAMINATION:  Blood pressure 101/68, pulse 80, temperature 98.3 F (36.8 C), temperature source Oral, resp. rate 20, height 5\' 6"  (1.676 m), weight 145 lb 6.4 oz (66 kg), SpO2 95 %.  ECOG PERFORMANCE STATUS: 1 - Symptomatic but completely ambulatory  Physical Exam  Constitutional: Oriented to person, place, and time and well-developed, well-nourished, and in no distress. No distress.  HENT:  Head: Normocephalic and atraumatic.  Mouth/Throat: Oropharynx is clear and moist. No oropharyngeal exudate.  Eyes: Conjunctivae are normal. Right eye exhibits no discharge. Left eye exhibits no  discharge. No scleral icterus.  Neck: Normal range of motion. Neck supple.  Cardiovascular: Normal rate, regular rhythm, normal heart sounds and intact distal pulses.   Pulmonary/Chest: Effort normal and breath sounds normal. No respiratory distress. No wheezes. No rales.  Abdominal: Soft. Bowel sounds are normal. Exhibits no distension and no mass. There is no tenderness.  Musculoskeletal: Normal range of motion. Exhibits no edema. No tenderness to palpation over the back and spine Lymphadenopathy:    No cervical adenopathy.  Neurological: Alert and oriented to person, place, and time. Exhibits normal muscle tone. Gait normal. Coordination normal.  Skin: Skin is warm and dry. No rash noted. Not diaphoretic. No erythema. No pallor.   Psychiatric: Mood, memory and judgment normal.  Vitals reviewed.  LABORATORY DATA: Lab Results  Component Value Date   WBC 5.3 01/10/2019   HGB 12.0 01/10/2019   HCT 36.4 01/10/2019   MCV 95.0 01/10/2019   PLT 336 01/10/2019      Chemistry      Component Value Date/Time   NA 141 01/10/2019 1031   K 3.8 01/10/2019 1031   CL 105 01/10/2019 1031   CO2 25 01/10/2019 1031   BUN 12 01/10/2019 1031   CREATININE 0.79 01/10/2019 1031      Component Value Date/Time   CALCIUM 8.9 01/10/2019 1031   ALKPHOS 115 01/10/2019 1031   AST 36 01/10/2019 1031   ALT 35 01/10/2019 1031   BILITOT 0.3 01/10/2019 1031       RADIOGRAPHIC STUDIES:  No results found.   ASSESSMENT/PLAN:  This is a very pleasant 68 year old Caucasian female diagnosed with high-grade neuroendocrine carcinoma with small and large cell features. Diagnosed in November 2019.  She presented with a left upper lobe lung nodule in addition to mediastinal, right upper lobe, and metastatic bone disease in the sixth and 10th rib. The patient had significant pain from her metastatic rib lesions for which she received palliative radiation She previously underwent treatment with systemic chemotherapy with a small cell lung cancer regimen including carboplatin, etoposide, and Tecentriq.  She status post 2 cycles.  She tolerated treatment well but it was discontinued secondary to disease progression.  The patient is currently undergoing first-line treatment for non-small cell lung carcinoma with a regimen including carboplatin for an AUC of 5, Alimta 500 mg/m and Keytruda 200 mg IV every 3 weeks.  Status post 1 cycle.  First dose received on December 17, 2018 She was seen with Dr. Earlie Server today.  Continues to tolerate treatment well except for some fatigue.  Labs were reviewed with the patient.  Recommend she proceed with cycle #2 today as scheduled. The patient is currently on 5 mg p.o. daily of Coumadin for the nonocclusive clot the  tip of her Port-A-Cath. the patient's INR was found to be 4.8 today.  She denies any bleeding. Patient was advised to hold her Coumadin for 3 days.  She will resume her treatment in 3 days with 1/2 a tablet (2.5 mg).  She was instructed to please call us when she needs a refill of her Coumadin so we can adjust the prescription to reflect these changes.  Regarding the patient's significant back pain, Dr. Julien Nordmann personally and independently re-reviewed the CT scan performed on January 20th, 2020.  There was no visual evidence of any malignancy-related causes of her back pain in the area of concern. I have arranged for an MRI of the thoracic spine to evaluate the etiology of her pain further.  I will see her  back in 3 weeks for a follow-up evaluation, repeat labs, and to go over MRI results prior to starting cycle #3.  The patient was advised to call immediately if she has any concerning symptoms in the interval. The patient voices understanding of current disease status and treatment options and is in agreement with the current care plan. All questions were answered. The patient knows to call the clinic with any problems, questions or concerns. We can certainly see the patient much sooner if necessary   Orders Placed This Encounter  Procedures  . MR Thoracic Spine Wo Contrast    Standing Status:   Future    Standing Expiration Date:   01/10/2020    Order Specific Question:   ** REASON FOR EXAM (FREE TEXT)    Answer:   Non-small cell lung cancer    Order Specific Question:   What is the patient's sedation requirement?    Answer:   No Sedation    Order Specific Question:   Does the patient have a pacemaker or implanted devices?    Answer:   No    Order Specific Question:   Preferred imaging location?    Answer:   [facility] (table limit-350 lbs)    Order Specific Question:   Use SRS Protocol?    Answer:   Yes    Order Specific Question:   Radiology Contrast Protocol - do NOT remove file  path    Answer:   [email or web address]     Kahliya Fraleigh L Jaquis Picklesimer, PA-C 01/10/19  ADDENDUM: Hematology/Oncology Attending: I had a face-to-face encounter with the patient.  I recommended her care plan.  This is a very pleasant 68 years old white female with metastatic high-grade neuroendocrine carcinoma that was initially treated with carboplatin and etoposide but no significant improvement in her disease.  The patient was switched to treatment with chemotherapy with carboplatin, Alimta and Keytruda status post 1 cycle.  She tolerated the first cycle of this treatment well.  I recommended for the patient to proceed with cycle #2 of her chemotherapy today. She continues to complain of low to mid back pain more to the right.  I reviewed her previous imaging studies and there was no clear finding to explain her pain.  I recommended for the patient to have MRI of the thoracic spine for further evaluation.  She will continue with pain medication for now. For the history of nonocclusive clot at the tip of the Port-A-Cath, the patient will continue on Coumadin.  She was unable to afford Lovenox or Xarelto.  Her INR was elevated today at 4.8.  I recommended for the patient to hold her Coumadin for the next 3 days and we will start with 2.5 mg p.o. daily.  We will continue to monitor her PT/INR closely. The patient will come back for follow-up visit in 3 weeks for evaluation before starting cycle #3. She was advised to call immediately if she has any other concerning symptoms in the interval.  Disclaimer: This note was dictated with voice recognition software. Similar sounding words can inadvertently be transcribed and may be missed upon review. Eilleen Kempf, MD 01/11/19

## 2019-01-10 NOTE — Patient Instructions (Signed)
Please let us know when you need a refill of coumadin so we can adjust the dose to 3 mg For now, take a 1/2 a tab instead of a full tablet of coumadin MRI of thoracic spine next week for evaluation of back pain Hold coumadin for 3 days

## 2019-01-10 NOTE — Telephone Encounter (Signed)
Scheduled additional cycles per 2/27 los - pt to get an updated schedule next visit.

## 2019-01-10 NOTE — Progress Notes (Unsigned)
Alison Sullivan had a critical INR of 4.8. Called to [name], RN at [facility]

## 2019-01-10 NOTE — Patient Instructions (Signed)
[facility] Discharge Instructions for Patients Receiving Chemotherapy  Today you received the following chemotherapy agents: Keytruda, Alimta, Carboplatin  To help prevent nausea and vomiting after your treatment, we encourage you to take your nausea medication as directed.   If you develop nausea and vomiting that is not controlled by your nausea medication, call the clinic.   BELOW ARE SYMPTOMS THAT SHOULD BE REPORTED IMMEDIATELY:  *FEVER GREATER THAN 100.5 F  *CHILLS WITH OR WITHOUT FEVER  NAUSEA AND VOMITING THAT IS NOT CONTROLLED WITH YOUR NAUSEA MEDICATION  *UNUSUAL SHORTNESS OF BREATH  *UNUSUAL BRUISING OR BLEEDING  TENDERNESS IN MOUTH AND THROAT WITH OR WITHOUT PRESENCE OF ULCERS  *URINARY PROBLEMS  *BOWEL PROBLEMS  UNUSUAL RASH Items with * indicate a potential emergency and should be followed up as soon as possible.  Feel free to call the clinic should you have any questions or concerns. The clinic phone number is [phone].  Please show the [facility] at check-in to the Emergency Department and triage nurse.

## 2019-01-11 ENCOUNTER — Telehealth: Payer: Self-pay | Admitting: *Deleted

## 2019-01-11 ENCOUNTER — Other Ambulatory Visit: Payer: Self-pay | Admitting: *Deleted

## 2019-01-11 NOTE — Telephone Encounter (Signed)
Received VM from patient regarding scheduling her MRI.  [name] patient.  Advised her that the request for MRI goes into a work queue and [facility]iology [facility] cabin crew is the department to schedule this scan. I informed patient that I can call them to schedule. Her scan needs to be done by 01/28/19 which is her next appt with Dr. Julien Nordmann. Pt states she will call, provide # to central scheduling.  No other questions or concerns.

## 2019-01-14 ENCOUNTER — Telehealth: Payer: Self-pay | Admitting: Medical Oncology

## 2019-01-14 LAB — PROTIME-INR
INR: 4.8 (ref 0.8–1.2)
Prothrombin Time: 44.5 seconds — ABNORMAL HIGH (ref 11.4–15.2)

## 2019-01-14 NOTE — Telephone Encounter (Signed)
4/6 ( Monday) -cannot make 830 lab appt . She is able to get here for appt with [facility] at [phone].  Can she get labs the Friday before or get labs after appt with [facility] on 4/6 ?

## 2019-01-15 ENCOUNTER — Encounter: Payer: Self-pay | Admitting: Medical Oncology

## 2019-01-15 NOTE — Telephone Encounter (Signed)
She can do her lab after the visit and just before chemo.

## 2019-01-16 ENCOUNTER — Other Ambulatory Visit: Payer: Medicare Other

## 2019-01-16 ENCOUNTER — Inpatient Hospital Stay: Payer: Medicare Other | Attending: Internal Medicine

## 2019-01-16 DIAGNOSIS — N189 Chronic kidney disease, unspecified: Secondary | ICD-10-CM | POA: Insufficient documentation

## 2019-01-16 DIAGNOSIS — Z79899 Other long term (current) drug therapy: Secondary | ICD-10-CM | POA: Insufficient documentation

## 2019-01-16 DIAGNOSIS — C7A1 Malignant poorly differentiated neuroendocrine tumors: Secondary | ICD-10-CM | POA: Insufficient documentation

## 2019-01-16 DIAGNOSIS — C7B8 Other secondary neuroendocrine tumors: Secondary | ICD-10-CM | POA: Insufficient documentation

## 2019-01-16 DIAGNOSIS — C3492 Malignant neoplasm of unspecified part of left bronchus or lung: Secondary | ICD-10-CM

## 2019-01-16 DIAGNOSIS — Z5112 Encounter for antineoplastic immunotherapy: Secondary | ICD-10-CM | POA: Insufficient documentation

## 2019-01-16 DIAGNOSIS — Z5111 Encounter for antineoplastic chemotherapy: Secondary | ICD-10-CM | POA: Insufficient documentation

## 2019-01-16 DIAGNOSIS — R5382 Chronic fatigue, unspecified: Secondary | ICD-10-CM

## 2019-01-16 DIAGNOSIS — G893 Neoplasm related pain (acute) (chronic): Secondary | ICD-10-CM | POA: Diagnosis not present

## 2019-01-16 DIAGNOSIS — Z7901 Long term (current) use of anticoagulants: Secondary | ICD-10-CM | POA: Diagnosis not present

## 2019-01-16 DIAGNOSIS — T85818D Embolism due to other internal prosthetic devices, implants and grafts, subsequent encounter: Secondary | ICD-10-CM

## 2019-01-16 LAB — COMPREHENSIVE METABOLIC PANEL
ALT: 27 U/L (ref 0–44)
AST: 25 U/L (ref 15–41)
Albumin: 3.5 g/dL (ref 3.5–5.0)
Alkaline Phosphatase: 90 U/L (ref 38–126)
Anion gap: 7 (ref 5–15)
BUN: 20 mg/dL (ref 8–23)
CHLORIDE: 104 mmol/L (ref 98–111)
CO2: 26 mmol/L (ref 22–32)
Calcium: 8.7 mg/dL — ABNORMAL LOW (ref 8.9–10.3)
Creatinine, Ser: 0.74 mg/dL (ref 0.44–1.00)
GFR calc Af Amer: >60 mL/min (ref >60)
GFR calc non Af Amer: >60 mL/min (ref >60)
Glucose, Bld: 118 mg/dL — ABNORMAL HIGH (ref 70–99)
Potassium: 3.8 mmol/L (ref 3.5–5.1)
Sodium: 137 mmol/L (ref 135–145)
Total Bilirubin: 0.5 mg/dL (ref 0.3–1.2)
Total Protein: 6.9 g/dL (ref 6.5–8.1)

## 2019-01-16 LAB — CBC WITH DIFFERENTIAL (CANCER CENTER ONLY)
Abs Immature Granulocytes: 0.01 10*3/uL (ref 0.00–0.07)
Basophils Absolute: 0 10*3/uL (ref 0.0–0.1)
Basophils Relative: 1 %
Eosinophils Absolute: 0.1 10*3/uL (ref 0.0–0.5)
Eosinophils Relative: 3 %
HCT: 34.4 % — ABNORMAL LOW (ref 36.0–46.0)
Hemoglobin: 11.4 g/dL — ABNORMAL LOW (ref 12.0–15.0)
Immature Granulocytes: 0 %
Lymphocytes Relative: 28 %
Lymphs Abs: 1 10*3/uL (ref 0.7–4.0)
MCH: 31.2 pg (ref 26.0–34.0)
MCHC: 33.1 g/dL (ref 30.0–36.0)
MCV: 94.2 fL (ref 80.0–100.0)
MONOS PCT: 2 %
Monocytes Absolute: 0.1 10*3/uL (ref 0.1–1.0)
Neutro Abs: 2.3 10*3/uL (ref 1.7–7.7)
Neutrophils Relative %: 66 %
Platelet Count: 228 10*3/uL (ref 150–400)
RBC: 3.65 MIL/uL — ABNORMAL LOW (ref 3.87–5.11)
RDW: 16.5 % — ABNORMAL HIGH (ref 11.5–15.5)
WBC Count: 3.6 10*3/uL — ABNORMAL LOW (ref 4.0–10.5)
nRBC: 0 % (ref 0.0–0.2)

## 2019-01-16 LAB — TSH: TSH: 1.041 u[IU]/mL (ref 0.308–3.960)

## 2019-01-16 LAB — PROTIME-INR
INR: 1.3 — ABNORMAL HIGH (ref 0.8–1.2)
Prothrombin Time: 16.2 seconds — ABNORMAL HIGH (ref 11.4–15.2)

## 2019-01-17 ENCOUNTER — Ambulatory Visit (HOSPITAL_COMMUNITY)
Admission: RE | Admit: 2019-01-17 | Discharge: 2019-01-17 | Disposition: A | Discharge status: Home / Self Care | Payer: Medicare Other | Source: Ambulatory Visit | Attending: Physician Assistant | Admitting: Physician Assistant

## 2019-01-17 ENCOUNTER — Other Ambulatory Visit: Payer: Self-pay | Admitting: Physician Assistant

## 2019-01-17 ENCOUNTER — Telehealth: Payer: Self-pay | Admitting: Physician Assistant

## 2019-01-17 ENCOUNTER — Other Ambulatory Visit: Payer: Self-pay | Admitting: Internal Medicine

## 2019-01-17 DIAGNOSIS — C3492 Malignant neoplasm of unspecified part of left bronchus or lung: Secondary | ICD-10-CM | POA: Diagnosis not present

## 2019-01-17 DIAGNOSIS — C7A1 Malignant poorly differentiated neuroendocrine tumors: Secondary | ICD-10-CM | POA: Diagnosis not present

## 2019-01-17 MED ORDER — GADOBUTROL 1 MMOL/ML IV SOLN
6.0000 mL | Freq: Once | INTRAVENOUS | Status: AC | PRN
  Administered 2019-01-17: 6 mL via INTRAVENOUS

## 2019-01-17 MED ORDER — METHYLPREDNISOLONE 4 MG PO TBPK: ORAL_TABLET | ORAL | 0 refills | Status: DC

## 2019-01-17 NOTE — Telephone Encounter (Signed)
Spoke to patient regarding the MRI of her thoracic spine that was performed today 01/17/2019. Discussed results of scan and that an urgent referral was sent to [name] in radiation oncology. Additionally, a medrol dose pack was sent to the patient's pharmacy. The patient voiced understanding. All questions were answered.

## 2019-01-18 NOTE — Progress Notes (Signed)
Histology and Location of Primary Cancer:  DIAGNOSIS: Stage IV (T1b,N2, M1c)high-grade neuroendocrine carcinoma with a small and large cell features diagnosed in November 2019  Sites of Visceral and Bony Metastatic Disease:  She presented with left upper lobe lung nodule in addition to mediastinal lymphadenopathy as well as right upper lobe as well as metastatic bone disease to the sixth and 10th rib.   01/17/19 MRI thoracic spine revealed: IMPRESSION: -Posterior T10 vertebral body bone lesion extending into the right pedicle and posterior elements and a 6 x 10 mm epidural tumor component along the right paracentral aspect of the vertebral body and extending along the roof of the right T10-11 neural foramen. -Left posterior tenth rib expansile bone lesion consistent with metastatic disease. Left posterior eleventh rib bone lesion. Soft tissue tumor along the left posterior twelfth rib.  Location(s) of Symptomatic Metastases: thoracic spine epidural tumor.   Past/Anticipated chemotherapy by medical oncology, if any:  Dr. Julien Nordmann last saw her on 12/10/18. She is receiving chemotherapy per Dr. Julien Nordmann The patient reported back pain to her right side and Dr. Julien Nordmann ordered a MRI. It revealed an epidural lesion to her thoracic spine. He referred her to Dr. Isidore Moos for consultation regarding radiation.  Pain on a scale of 0-10 is: She reports pain to her mid to lower back. She is taking 10 mg oxycodone and 300 mg gabapentin about every 8-10 hours with relief.    If Spine Met(s), symptoms, if any, include:  Bowel/Bladder retention or incontinence (please describe): She denies.   Numbness or weakness in extremities (please describe): She reports numbness to her feet at times.   Current Decadron regimen, if applicable: She is taking a medrol dose pack currently.   Ambulatory status? Walker? Wheelchair?: Ambulatory.   SAFETY ISSUES:  Prior radiation? Yes,  Radiation treatment dates:    10/23/18 - 11/06/18 Site/dose:   Left Lower Rib Mass / 30 Gy delivered in 10 fractions of 3 Gy  Pacemaker/ICD? No  Possible current pregnancy? No  Is the patient on methotrexate? No  Current Complaints / other details:    BP 113/63   Pulse 79   Temp 97.7 F (36.5 C) (Oral)   Resp 18   Wt 145 lb 9.6 oz (66 kg)   SpO2 95% Comment: room air  BMI 23.50 kg/m    Wt Readings from Last 3 Encounters:  01/22/19 145 lb 9.6 oz (66 kg)  01/10/19 145 lb 6.4 oz (66 kg)  12/10/18 142 lb 3.2 oz (64.5 kg)

## 2019-01-21 ENCOUNTER — Other Ambulatory Visit: Payer: Self-pay | Admitting: Physician Assistant

## 2019-01-21 ENCOUNTER — Other Ambulatory Visit: Payer: Self-pay | Admitting: *Deleted

## 2019-01-21 DIAGNOSIS — C7951 Secondary malignant neoplasm of bone: Secondary | ICD-10-CM

## 2019-01-21 MED ORDER — OXYCODONE HCL 5 MG PO TABS: 5.0000 mg | ORAL_TABLET | Freq: Four times a day (QID) | ORAL | 0 refills | Status: DC | PRN

## 2019-01-21 NOTE — Telephone Encounter (Signed)
[name] patient and informed that her refill has been sent in to her pharmacy. She voiced understanding and will pick it up this afternoon.

## 2019-01-21 NOTE — Telephone Encounter (Signed)
Received VM message from patient requesting refill of her oxycodone, . [name] patient and spoke with her. She is oxycodone 5 mg tabs 2 of them approx.3 x a day along with gabapentin. This has been the most effective regime. She sees Dr. Isidore Moos in [place] tomorrow.  She only has 2 tablets left. Last filled 01/07/19. [facility] uses [facility] [address]

## 2019-01-22 ENCOUNTER — Ambulatory Visit
Admission: RE | Admit: 2019-01-22 | Discharge: 2019-01-22 | Disposition: A | Discharge status: Home / Self Care | Payer: Medicare Other | Source: Ambulatory Visit | Attending: Radiation Oncology | Admitting: Radiation Oncology

## 2019-01-22 ENCOUNTER — Other Ambulatory Visit: Payer: Self-pay

## 2019-01-22 ENCOUNTER — Encounter: Payer: Self-pay | Admitting: Radiation Oncology

## 2019-01-22 DIAGNOSIS — Z79899 Other long term (current) drug therapy: Secondary | ICD-10-CM | POA: Diagnosis not present

## 2019-01-22 DIAGNOSIS — R109 Unspecified abdominal pain: Secondary | ICD-10-CM | POA: Insufficient documentation

## 2019-01-22 DIAGNOSIS — K5900 Constipation, unspecified: Secondary | ICD-10-CM | POA: Insufficient documentation

## 2019-01-22 DIAGNOSIS — Z85118 Personal history of other malignant neoplasm of bronchus and lung: Secondary | ICD-10-CM | POA: Diagnosis not present

## 2019-01-22 DIAGNOSIS — C3490 Malignant neoplasm of unspecified part of unspecified bronchus or lung: Secondary | ICD-10-CM | POA: Diagnosis not present

## 2019-01-22 DIAGNOSIS — Z923 Personal history of irradiation: Secondary | ICD-10-CM | POA: Insufficient documentation

## 2019-01-22 DIAGNOSIS — G893 Neoplasm related pain (acute) (chronic): Secondary | ICD-10-CM | POA: Insufficient documentation

## 2019-01-22 DIAGNOSIS — C7949 Secondary malignant neoplasm of other parts of nervous system: Secondary | ICD-10-CM | POA: Diagnosis not present

## 2019-01-22 DIAGNOSIS — Z7901 Long term (current) use of anticoagulants: Secondary | ICD-10-CM | POA: Insufficient documentation

## 2019-01-22 DIAGNOSIS — Z9221 Personal history of antineoplastic chemotherapy: Secondary | ICD-10-CM | POA: Insufficient documentation

## 2019-01-22 DIAGNOSIS — C7951 Secondary malignant neoplasm of bone: Secondary | ICD-10-CM | POA: Insufficient documentation

## 2019-01-22 NOTE — Progress Notes (Signed)
Radiation Oncology         [phone] ________________________________  Name: Alison Sullivan MRN: [id number]  Date: 01/22/2019  DOB: [birth year]  Re-Evaluation Note  Outpatient  CC: [name], MD  Tanda Rockers, MD  Diagnosis and Prior Radiotherapy:    ICD-10-CM   1. Bone metastases ([facility]) C79.51   Stage IV lung cancer  10/23/18 - 11/06/18: Left Lower Rib Mass / 30 Gy delivered in 10 fractions of 3 Gy  CHIEF COMPLAINT: Here for follow-up and surveillance of metastatic lung cancer  Narrative:  The patient returns today for re-evaluation. At her last visit, her restaging CT scans showed disease progression.  Decision was made to change her systemic therapy.  She was then started on systemic chemotherapy with carboplatin for AUC of 5, Alimta 500 mg/M2 and Keytruda 200 mg IV every 3 weeks on 12/17/2018 under Dr. Julien Nordmann.   She reports pain to her back began to increase to unbearable levels, and she began to suspect new issues in her spine. She proceeded to thoracic spine MRI on 01/17/2019 with results revealing: posterior T10 vertebral body bone lesion extending into the right pedicle and posterior elements and a 6 x 10 mm epidural tumor component along the right paracentral aspect of the vertebral body and extending along the roof of the right T10-11 neural foramen; left posterior 10th rib expansile bone lesion consistent with metastatic disease, left posterior 11th rib bone lesion, soft tissue tumor along the left posterior 12th rib.  On review of systems, she reports intense pain to her mid to lower back, now bilaterally previously just left. She reports numbness to her feet sometimes and very mild constipation. She is currently on oxycodone, gabapentin, and a medrol dose pack. She reports abdominal pain and denies pain in her legs and hips. She states she is able to sleep more comfortably on her left side. If she rolls over in her sleep, she awakens in extreme pain to her right  side. She denies and bladder issues.  She is ambulating without issues.   ALLERGIES:  has No Known Allergies.  Meds: Current Outpatient Medications  Medication Sig Dispense Refill  . folic acid (FOLVITE) 1 MG tablet Take 1 tablet (1 mg total) by mouth daily. 30 tablet 4  . gabapentin (NEURONTIN) 300 MG capsule Take 1 capsule (300 mg total) by mouth 3 (three) times daily. 90 capsule 0  . lidocaine-prilocaine (EMLA) cream Apply 1 application topically as needed. 30 g 0  . Melatonin 10 MG TABS Take by mouth at bedtime.    . methylPREDNISolone (MEDROL DOSEPAK) 4 MG TBPK tablet Use as instructed 21 tablet 0  . oxyCODONE (OXY IR/ROXICODONE) 5 MG immediate release tablet Take 1 tablet (5 mg total) by mouth every 6 (six) hours as needed for severe pain. (Patient taking differently: Take 5 mg by mouth every 6 (six) hours as needed for severe pain. Pt reports taking 10 mg oxycodone with 1 gabapentin every 8-10 hours.) 60 tablet 0  . simvastatin (ZOCOR) 20 MG tablet TAKE 1 TABLET(20 MG) BY MOUTH AT BEDTIME 90 tablet 3  . warfarin (COUMADIN) 5 MG tablet TAKE 1 TABLET(5 MG) BY MOUTH DAILY 90 tablet 1  . ibuprofen (ADVIL,MOTRIN) 800 MG tablet Take 1 tablet (800 mg total) by mouth every 8 (eight) hours as needed. (Patient not taking: Reported on 01/10/2019) 90 tablet 0  . prochlorperazine (COMPAZINE) 10 MG tablet Take 1 tablet (10 mg total) by mouth every 6 (six) hours as needed for  nausea or vomiting. (Patient not taking: Reported on 12/07/2018) 30 tablet 0   No current facility-administered medications for this encounter.     Physical Findings: The patient is in no acute distress. Patient is alert and oriented.  weight is 145 lb 9.6 oz (66 kg). Her oral temperature is 97.7 F (36.5 C). Her blood pressure is 113/63 and her pulse is 79. Her respiration is 18 and oxygen saturation is 95%. .    General: Alert and oriented, in no acute distress Abdomen: Tender to palpation, moderately distended. Back: Pain  to palpation in lower left rib cage. Left flank pain that corresponds to the left rib pain.  There is also some pain in the right lower back to palpation  neurologic: No obvious focalities. Speech is fluent. Coordination is intact. Psychiatric: Judgment and insight are intact. Affect is appropriate.   Lab Findings: Lab Results  Component Value Date   WBC 3.6 (L) 01/16/2019   HGB 11.4 (L) 01/16/2019   HCT 34.4 (L) 01/16/2019   MCV 94.2 01/16/2019   PLT 228 01/16/2019    Radiographic Findings: Mr Thoracic Spine W Wo Contrast  Result Date: 01/17/2019 CLINICAL DATA:  Stage IV (T1b, N2, M1c) 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 Known mets to 6th and 10th rib, pt c/o back pain EXAM: MRI THORACIC WITHOUT AND WITH CONTRAST TECHNIQUE: Multiplanar and multiecho pulse sequences of the thoracic spine were obtained without and with intravenous contrast. CONTRAST:  6 mL Gadavist COMPARISON:  CT chest 08/21/2018 FINDINGS: MRI THORACIC SPINE FINDINGS Alignment:  Physiologic. Vertebrae: No acute fracture. No discitis or osteomyelitis. Posterior T10 vertebral body bone lesion extending into the right pedicle and posterior elements and a 6 x 10 mm epidural tumor component along the right paracentral aspect of the vertebral body and extending along the roof of the right T10-11 neural foramen. Left posterior tenth rib expansile bone lesion consistent with metastatic disease. Left posterior eleventh rib bone lesion. Soft tissue tumor along the left posterior twelfth rib. Cord:  Normal signal and morphology. Paraspinal and other soft tissues: No acute paraspinal abnormality. Right upper lobe airspace disease. Enlarged left periaortic lymph node measuring 2.5 cm. Disc levels: Disc spaces are maintained. No disc protrusion, foraminal stenosis or central canal stenosis. IMPRESSION: Posterior T10 vertebral body bone lesion extending into the right pedicle and  posterior elements and a 6 x 10 mm epidural tumor component along the right paracentral aspect of the vertebral body and extending along the roof of the right T10-11 neural foramen. Left posterior tenth rib expansile bone lesion consistent with metastatic disease. Left posterior eleventh rib bone lesion. Soft tissue tumor along the left posterior twelfth rib. Electronically Signed   By: Kathreen Devoid   On: 01/17/2019 13:49    Impression/Plan: Metastatic lung cancer to the thoracic spine and ribs  The patient was discussed at CNS tumor board.  The consensus was to proceed with fractionated palliative radiotherapy  It was a pleasure seeing the patient today. We reviewed the risks, benefits, and side effects of radiotherapy. No guarantees of treatment were given. A consent form was signed and placed in the patient's medical record. The patient is enthusiastic about proceeding with treatment. I look forward to participating in the patient's care.  She will proceed to CT simulation following this appointment. Anticipate 2 weeks of daily treatments directed to T10 and adjacent symptomatic rib mets starting Monday, 01/28/2019.   I recommend IMRT which is medically necessary  in order to spare her small bowel and kidneys.  The patient had previous radiotherapy adjacent to the current sites of bone metastases; there may be some overlap of previous radiotherapy and the patient understands that this increases her risk of side effects to some degree.  IMRT will help minimize the degree of dose overlap  Simulation to take place today and we will start treatment early next week  I spent 30 minutes  face to face with the patient and more than 50% of that time was spent in counseling and/or coordination of care. _________________________   [name], MD   This document serves as a record of services personally performed by [name], MD. It was created on her behalf by Wilburn Mylar, a trained medical  scribe. The creation of this record is based on the scribe's personal observations and the provider's statements to them. This document has been checked and approved by the attending provider.

## 2019-01-22 NOTE — Progress Notes (Signed)
  Radiation Oncology         [phone] ________________________________  Name: KERRILYN AZBILL MRN: [id number]  Date: 01/22/2019  DOB: 21-Jun-1951  SIMULATION AND TREATMENT PLANNING NOTE  Outpatient  DIAGNOSIS:     ICD-10-CM   1. Bone metastases ([place]) C79.51     NARRATIVE:  The patient was brought to the [facility].  Identity was confirmed.  All relevant records and images related to the planned course of therapy were reviewed.  The patient freely provided informed written consent to proceed with treatment after reviewing the details related to the planned course of therapy. The consent form was witnessed and verified by the simulation staff.    Then, the patient was set-up in a stable reproducible supine position for radiation therapy.  CT images were obtained.  Surface markings were placed.  The CT images were loaded into the planning software.    TREATMENT PLANNING NOTE: Treatment planning then occurred.  The radiation prescription was entered and confirmed.    I have requested : Intensity Modulated Radiotherapy (IMRT) is medically necessary for this case for the following reason:  Small bowel and kidney sparing..    The patient will receive 30 Gy in 10 fractions.  Special Treatment Procedure Note: The patient will be receiving chemotherapy concurrently. Chemotherapy heightens the risk of side effects. I have considered this during the patient's treatment planning process and will monitor the patient accordingly for side effects on a weekly basis. Concurrent chemotherapy increases the complexity of this patient's treatment and therefore this constitutes a special treatment procedure.  Special Treatment Procedure Note: The patient received prior radiotherapy close to her current fields. There could be some overlap of radiation dose.  Prior regional radiotherapy increases the risk of side effects from treatment. I have considered this in the treatment planning  process and have aimed to minimize tissue overlap.  This increases the complexity of this patient's treatment and therefore this constitutes a special treatment procedure.    -----------------------------------  [name], MD

## 2019-01-23 ENCOUNTER — Other Ambulatory Visit: Payer: Self-pay

## 2019-01-23 ENCOUNTER — Other Ambulatory Visit: Payer: Medicare Other

## 2019-01-23 ENCOUNTER — Inpatient Hospital Stay: Payer: Medicare Other

## 2019-01-23 ENCOUNTER — Encounter: Payer: Self-pay | Admitting: Radiation Oncology

## 2019-01-23 DIAGNOSIS — Z5112 Encounter for antineoplastic immunotherapy: Secondary | ICD-10-CM | POA: Diagnosis not present

## 2019-01-23 DIAGNOSIS — Z5111 Encounter for antineoplastic chemotherapy: Secondary | ICD-10-CM | POA: Diagnosis not present

## 2019-01-23 DIAGNOSIS — Z923 Personal history of irradiation: Secondary | ICD-10-CM | POA: Diagnosis not present

## 2019-01-23 DIAGNOSIS — C7B8 Other secondary neuroendocrine tumors: Secondary | ICD-10-CM | POA: Diagnosis not present

## 2019-01-23 DIAGNOSIS — C3492 Malignant neoplasm of unspecified part of left bronchus or lung: Secondary | ICD-10-CM

## 2019-01-23 DIAGNOSIS — G893 Neoplasm related pain (acute) (chronic): Secondary | ICD-10-CM | POA: Diagnosis not present

## 2019-01-23 DIAGNOSIS — T85818D Embolism due to other internal prosthetic devices, implants and grafts, subsequent encounter: Secondary | ICD-10-CM

## 2019-01-23 DIAGNOSIS — C7951 Secondary malignant neoplasm of bone: Secondary | ICD-10-CM | POA: Diagnosis not present

## 2019-01-23 DIAGNOSIS — N189 Chronic kidney disease, unspecified: Secondary | ICD-10-CM | POA: Diagnosis not present

## 2019-01-23 DIAGNOSIS — C7A1 Malignant poorly differentiated neuroendocrine tumors: Secondary | ICD-10-CM | POA: Diagnosis not present

## 2019-01-23 LAB — CBC WITH DIFFERENTIAL (CANCER CENTER ONLY)
ABS IMMATURE GRANULOCYTES: 0.02 10*3/uL (ref 0.00–0.07)
Basophils Absolute: 0 10*3/uL (ref 0.0–0.1)
Basophils Relative: 0 %
Eosinophils Absolute: 0 10*3/uL (ref 0.0–0.5)
Eosinophils Relative: 0 %
HCT: 31.2 % — ABNORMAL LOW (ref 36.0–46.0)
HEMOGLOBIN: 10.2 g/dL — AB (ref 12.0–15.0)
Immature Granulocytes: 1 %
LYMPHS ABS: 1.1 10*3/uL (ref 0.7–4.0)
Lymphocytes Relative: 35 %
MCH: 31.9 pg (ref 26.0–34.0)
MCHC: 32.7 g/dL (ref 30.0–36.0)
MCV: 97.5 fL (ref 80.0–100.0)
Monocytes Absolute: 0.6 10*3/uL (ref 0.1–1.0)
Monocytes Relative: 17 %
Neutro Abs: 1.5 10*3/uL — ABNORMAL LOW (ref 1.7–7.7)
Neutrophils Relative %: 47 %
Platelet Count: 86 10*3/uL — ABNORMAL LOW (ref 150–400)
RBC: 3.2 MIL/uL — ABNORMAL LOW (ref 3.87–5.11)
RDW: 17 % — ABNORMAL HIGH (ref 11.5–15.5)
WBC Count: 3.3 10*3/uL — ABNORMAL LOW (ref 4.0–10.5)
nRBC: 0.6 % — ABNORMAL HIGH (ref 0.0–0.2)

## 2019-01-23 LAB — CMP (CANCER CENTER ONLY)
ALT: 52 U/L — ABNORMAL HIGH (ref 0–44)
AST: 31 U/L (ref 15–41)
Albumin: 3.3 g/dL — ABNORMAL LOW (ref 3.5–5.0)
Alkaline Phosphatase: 102 U/L (ref 38–126)
Anion gap: 10 (ref 5–15)
BUN: 16 mg/dL (ref 8–23)
CO2: 25 mmol/L (ref 22–32)
Calcium: 8.5 mg/dL — ABNORMAL LOW (ref 8.9–10.3)
Chloride: 106 mmol/L (ref 98–111)
Creatinine: 0.78 mg/dL (ref 0.44–1.00)
GFR, Est AFR Am: >60 mL/min (ref >60)
GFR, Estimated: >60 mL/min (ref >60)
Glucose, Bld: 109 mg/dL — ABNORMAL HIGH (ref 70–99)
Potassium: 3.9 mmol/L (ref 3.5–5.1)
Sodium: 141 mmol/L (ref 135–145)
Total Bilirubin: 0.3 mg/dL (ref 0.3–1.2)
Total Protein: 6.7 g/dL (ref 6.5–8.1)

## 2019-01-23 LAB — PROTIME-INR
INR: 1.3 — AB (ref 0.8–1.2)
Prothrombin Time: 15.8 seconds — ABNORMAL HIGH (ref 11.4–15.2)

## 2019-01-24 ENCOUNTER — Telehealth: Payer: Self-pay | Admitting: Medical Oncology

## 2019-01-24 ENCOUNTER — Encounter: Payer: Self-pay | Admitting: Medical Oncology

## 2019-01-24 NOTE — Telephone Encounter (Signed)
Curt Bears, MD  Ardeen Garland, RN        Please change her Coumadin to 5 mg every other day except Monday and Thursday 2.5 mg. Thank you   Pt  Instructed in directions above.  She was taking 2.5 mg coumadin every day.

## 2019-01-28 ENCOUNTER — Inpatient Hospital Stay (HOSPITAL_BASED_OUTPATIENT_CLINIC_OR_DEPARTMENT_OTHER): Payer: Medicare Other | Admitting: Internal Medicine

## 2019-01-28 ENCOUNTER — Encounter: Payer: Self-pay | Admitting: Internal Medicine

## 2019-01-28 ENCOUNTER — Inpatient Hospital Stay: Payer: Medicare Other

## 2019-01-28 ENCOUNTER — Ambulatory Visit
Admission: RE | Admit: 2019-01-28 | Discharge: 2019-01-28 | Disposition: A | Discharge status: Home / Self Care | Payer: Medicare Other | Source: Ambulatory Visit | Attending: Radiation Oncology | Admitting: Radiation Oncology

## 2019-01-28 ENCOUNTER — Other Ambulatory Visit: Payer: Medicare Other

## 2019-01-28 ENCOUNTER — Other Ambulatory Visit: Payer: Self-pay

## 2019-01-28 VITALS — BP 112/70 | HR 114 | Temp 98.4°F | Resp 18 | Ht 66.0 in | Wt 148.2 lb

## 2019-01-28 VITALS — HR 90

## 2019-01-28 DIAGNOSIS — Z7901 Long term (current) use of anticoagulants: Secondary | ICD-10-CM | POA: Diagnosis not present

## 2019-01-28 DIAGNOSIS — C7A1 Malignant poorly differentiated neuroendocrine tumors: Secondary | ICD-10-CM | POA: Diagnosis not present

## 2019-01-28 DIAGNOSIS — C3492 Malignant neoplasm of unspecified part of left bronchus or lung: Secondary | ICD-10-CM

## 2019-01-28 DIAGNOSIS — C7B8 Other secondary neuroendocrine tumors: Secondary | ICD-10-CM

## 2019-01-28 DIAGNOSIS — Z923 Personal history of irradiation: Secondary | ICD-10-CM | POA: Diagnosis not present

## 2019-01-28 DIAGNOSIS — G893 Neoplasm related pain (acute) (chronic): Secondary | ICD-10-CM

## 2019-01-28 DIAGNOSIS — Z5111 Encounter for antineoplastic chemotherapy: Secondary | ICD-10-CM

## 2019-01-28 DIAGNOSIS — C7951 Secondary malignant neoplasm of bone: Secondary | ICD-10-CM | POA: Diagnosis not present

## 2019-01-28 DIAGNOSIS — Z5112 Encounter for antineoplastic immunotherapy: Secondary | ICD-10-CM | POA: Diagnosis not present

## 2019-01-28 DIAGNOSIS — C3490 Malignant neoplasm of unspecified part of unspecified bronchus or lung: Secondary | ICD-10-CM

## 2019-01-28 DIAGNOSIS — T85818D Embolism due to other internal prosthetic devices, implants and grafts, subsequent encounter: Secondary | ICD-10-CM

## 2019-01-28 DIAGNOSIS — N189 Chronic kidney disease, unspecified: Secondary | ICD-10-CM | POA: Diagnosis not present

## 2019-01-28 LAB — CMP (CANCER CENTER ONLY)
ALT: 32 U/L (ref 0–44)
AST: 21 U/L (ref 15–41)
Albumin: 3.1 g/dL — ABNORMAL LOW (ref 3.5–5.0)
Alkaline Phosphatase: 105 U/L (ref 38–126)
Anion gap: 12 (ref 5–15)
BUN: 15 mg/dL (ref 8–23)
CO2: 23 mmol/L (ref 22–32)
Calcium: 8.4 mg/dL — ABNORMAL LOW (ref 8.9–10.3)
Chloride: 104 mmol/L (ref 98–111)
Creatinine: 0.88 mg/dL (ref 0.44–1.00)
GFR, Estimated: >60 mL/min (ref >60)
Glucose, Bld: 149 mg/dL — ABNORMAL HIGH (ref 70–99)
Potassium: 3.9 mmol/L (ref 3.5–5.1)
Sodium: 139 mmol/L (ref 135–145)
Total Bilirubin: 0.5 mg/dL (ref 0.3–1.2)
Total Protein: 6.6 g/dL (ref 6.5–8.1)

## 2019-01-28 LAB — CBC WITH DIFFERENTIAL (CANCER CENTER ONLY)
Abs Immature Granulocytes: 0.06 10*3/uL (ref 0.00–0.07)
BASOS ABS: 0 10*3/uL (ref 0.0–0.1)
Basophils Relative: 0 %
Eosinophils Absolute: 0.1 10*3/uL (ref 0.0–0.5)
Eosinophils Relative: 1 %
HCT: 32.8 % — ABNORMAL LOW (ref 36.0–46.0)
Hemoglobin: 10.6 g/dL — ABNORMAL LOW (ref 12.0–15.0)
IMMATURE GRANULOCYTES: 1 %
Lymphocytes Relative: 23 %
Lymphs Abs: 1.3 10*3/uL (ref 0.7–4.0)
MCH: 32.5 pg (ref 26.0–34.0)
MCHC: 32.3 g/dL (ref 30.0–36.0)
MCV: 100.6 fL — ABNORMAL HIGH (ref 80.0–100.0)
Monocytes Absolute: 1.2 10*3/uL — ABNORMAL HIGH (ref 0.1–1.0)
Monocytes Relative: 21 %
NEUTROS PCT: 54 %
NRBC: 0 % (ref 0.0–0.2)
Neutro Abs: 2.9 10*3/uL (ref 1.7–7.7)
Platelet Count: 229 10*3/uL (ref 150–400)
RBC: 3.26 MIL/uL — AB (ref 3.87–5.11)
RDW: 19.5 % — ABNORMAL HIGH (ref 11.5–15.5)
WBC Count: 5.5 10*3/uL (ref 4.0–10.5)

## 2019-01-28 LAB — PROTIME-INR
INR: 1.4 — ABNORMAL HIGH (ref 0.8–1.2)
Prothrombin Time: 17.3 seconds — ABNORMAL HIGH (ref 11.4–15.2)

## 2019-01-28 MED ORDER — PALONOSETRON HCL INJECTION 0.25 MG/5ML
INTRAVENOUS | Status: AC
  Filled 2019-01-28: qty 5

## 2019-01-28 MED ORDER — SODIUM CHLORIDE 0.9 % IV SOLN
Freq: Once | INTRAVENOUS | Status: AC
  Administered 2019-01-28: 12:00:00 via INTRAVENOUS
  Filled 2019-01-28: qty 5

## 2019-01-28 MED ORDER — SODIUM CHLORIDE 0.9 % IV SOLN
403.0000 mg | Freq: Once | INTRAVENOUS | Status: AC
  Administered 2019-01-28: 400 mg via INTRAVENOUS
  Filled 2019-01-28: qty 40

## 2019-01-28 MED ORDER — SODIUM CHLORIDE 0.9 % IV SOLN
Freq: Once | INTRAVENOUS | Status: AC
  Administered 2019-01-28: 12:00:00 via INTRAVENOUS
  Filled 2019-01-28: qty 250

## 2019-01-28 MED ORDER — SODIUM CHLORIDE 0.9 % IV SOLN
200.0000 mg | Freq: Once | INTRAVENOUS | Status: AC
  Administered 2019-01-28: 200 mg via INTRAVENOUS
  Filled 2019-01-28: qty 8

## 2019-01-28 MED ORDER — HEPARIN SOD (PORK) LOCK FLUSH 100 UNIT/ML IV SOLN
500.0000 [IU] | Freq: Once | INTRAVENOUS | Status: AC | PRN
  Administered 2019-01-28: 500 [IU]
  Filled 2019-01-28: qty 5

## 2019-01-28 MED ORDER — SODIUM CHLORIDE 0.9% FLUSH
10.0000 mL | INTRAVENOUS | Status: DC | PRN
  Administered 2019-01-28: 10 mL
  Filled 2019-01-28: qty 10

## 2019-01-28 MED ORDER — SODIUM CHLORIDE 0.9 % IV SOLN
515.0000 mg/m2 | Freq: Once | INTRAVENOUS | Status: AC
  Administered 2019-01-28: 900 mg via INTRAVENOUS
  Filled 2019-01-28: qty 20

## 2019-01-28 MED ORDER — PALONOSETRON HCL INJECTION 0.25 MG/5ML
0.2500 mg | Freq: Once | INTRAVENOUS | Status: AC
  Administered 2019-01-28: 0.25 mg via INTRAVENOUS

## 2019-01-28 NOTE — Progress Notes (Signed)
[facility] Telephone:(336) [phone]   Fax:(336) [phone]  OFFICE PROGRESS NOTE  [name], MD [place] [address]  DIAGNOSIS: Stage IV (T1b, N2, M1c) 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  She presented with left upper lobe lung nodule in addition to mediastinal lymphadenopathy as well as right upper lobe as well as metastatic bone disease to the sixth and 10th rib.  The patient has significant pain from her metastatic rib lesions.  PRIOR THERAPY: systemic chemotherapy with carboplatin for AUC of 5 on day 1, etoposide 100 mg/M2 on days 1, 2 and 3 as well as Tecentriq 1200 mg IV every 3 weeks with Neulasta support.  Status post 2 cycles, discontinued secondary to disease progression. .  CURRENT THERAPY: Systemic chemotherapy with carboplatin for AUC of 5, Alimta 500 mg/M2 and Keytruda 200 mg IV every 3 weeks.  First dose December 17, 2018.  INTERVAL HISTORY: Alison Sullivan 68 y.o. female returns to the clinic today for follow-up visit.  The patient is feeling fine today with no concerning complaints except for the pain on the right side of the mid back.  She had MRI of the thoracic spine performed recently that showed T10 lesion with questionable epidural extension.  She was seen by Dr. Isidore Moos and expected to start the first dose of palliative radiation today.  She is currently on oxycodone and gabapentin.  The patient denied having any current chest pain, shortness of breath, cough or hemoptysis.  She denied having any nausea, vomiting, diarrhea or constipation.  She continues to tolerate her treatment with chemotherapy fairly well.  She is here for evaluation before starting cycle #3.  MEDICAL HISTORY: Past Medical History:  Diagnosis Date   Arthritis    Chronic kidney disease    Dog bite of wrist    History of kidney stones    History of radiation therapy 10/23/18-  11/06/18   Left lower rib mass/ 30 Gy delivered in 10 fractions 3 Gy.    Kidney infection    Left renal mass    Osteopenia    Pyelonephritis    Recurrent UTI (urinary tract infection)     ALLERGIES:  has No Known Allergies.  MEDICATIONS:  Current Outpatient Medications  Medication Sig Dispense Refill   folic acid (FOLVITE) 1 MG tablet Take 1 tablet (1 mg total) by mouth daily. 30 tablet 4   gabapentin (NEURONTIN) 300 MG capsule Take 1 capsule (300 mg total) by mouth 3 (three) times daily. 90 capsule 0   lidocaine-prilocaine (EMLA) cream Apply 1 application topically as needed. 30 g 0   Melatonin 10 MG TABS Take by mouth at bedtime.     oxyCODONE (OXY IR/ROXICODONE) 5 MG immediate release tablet Take 1 tablet (5 mg total) by mouth every 6 (six) hours as needed for severe pain. (Patient taking differently: Take 5 mg by mouth every 6 (six) hours as needed for severe pain. Pt reports taking 10 mg oxycodone with 1 gabapentin every 8-10 hours.) 60 tablet 0   simvastatin (ZOCOR) 20 MG tablet TAKE 1 TABLET(20 MG) BY MOUTH AT BEDTIME 90 tablet 3   warfarin (COUMADIN) 5 MG tablet TAKE 1 TABLET(5 MG) BY MOUTH DAILY 90 tablet 1   prochlorperazine (COMPAZINE) 10 MG tablet Take 1 tablet (10 mg total) by mouth every 6 (six) hours as needed for nausea or vomiting. (Patient not taking: Reported on 12/07/2018) 30 tablet 0  No current facility-administered medications for this visit.     SURGICAL HISTORY:  Past Surgical History:  Procedure Laterality Date   CYSTOSCOPY WITH RETROGRADE PYELOGRAM, URETEROSCOPY AND STENT PLACEMENT Left 12/28/2017   Procedure: CYSTOSCOPY WITH RETROGRADE PYELOGRAM, URETEROSCOPY AND STENT PLACEMENT, STONE BASKETRY;  Surgeon: Festus Aloe, MD;  Location: [facility];  Service: Urology;  Laterality: Left;   FINGER SURGERY     index, growth removal   HOLMIUM LASER APPLICATION Left 02/23/8785   Procedure: HOLMIUM LASER APPLICATION;  Surgeon:  Festus Aloe, MD;  Location: [facility];  Service: Urology;  Laterality: Left;   IR IMAGING GUIDED PORT INSERTION  10/31/2018   KIDNEY STONE SURGERY  2011 or 2012   LITHOTRIPSY  2007    REVIEW OF SYSTEMS:  A comprehensive review of systems was negative except for: Constitutional: positive for fatigue Musculoskeletal: positive for back pain   PHYSICAL EXAMINATION: General appearance: alert, cooperative, fatigued and no distress Head: Normocephalic, without obvious abnormality, atraumatic Neck: no adenopathy, no JVD, supple, symmetrical, trachea midline and thyroid not enlarged, symmetric, no tenderness/mass/nodules Lymph nodes: Cervical, supraclavicular, and axillary nodes normal. Resp: clear to auscultation bilaterally Back: symmetric, no curvature. ROM normal. No CVA tenderness. Cardio: regular rate and rhythm, S1, S2 normal, no murmur, click, rub or gallop GI: soft, non-tender; bowel sounds normal; no masses,  no organomegaly Extremities: extremities normal, atraumatic, no cyanosis or edema  ECOG PERFORMANCE STATUS: 1 - Symptomatic but completely ambulatory  Blood pressure 112/70, pulse (!) 114, temperature 98.4 F (36.9 C), temperature source Oral, resp. rate 18, height 5\' 6"  (1.676 m), weight 148 lb 3.2 oz (67.2 kg), SpO2 95 %.  LABORATORY DATA: Lab Results  Component Value Date   WBC 5.5 01/28/2019   HGB 10.6 (L) 01/28/2019   HCT 32.8 (L) 01/28/2019   MCV 100.6 (H) 01/28/2019   PLT 229 01/28/2019      Chemistry      Component Value Date/Time   NA 139 01/28/2019 1006   K 3.9 01/28/2019 1006   CL 104 01/28/2019 1006   CO2 23 01/28/2019 1006   BUN 15 01/28/2019 1006   CREATININE 0.88 01/28/2019 1006      Component Value Date/Time   CALCIUM 8.4 (L) 01/28/2019 1006   ALKPHOS 105 01/28/2019 1006   AST 21 01/28/2019 1006   ALT 32 01/28/2019 1006   BILITOT 0.5 01/28/2019 1006       RADIOGRAPHIC STUDIES: Mr Thoracic Spine W Wo  Contrast  Result Date: 01/17/2019 CLINICAL DATA:  Stage IV (T1b, N2, M1c) 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 Known mets to 6th and 10th rib, pt c/o back pain EXAM: MRI THORACIC WITHOUT AND WITH CONTRAST TECHNIQUE: Multiplanar and multiecho pulse sequences of the thoracic spine were obtained without and with intravenous contrast. CONTRAST:  6 mL Gadavist COMPARISON:  CT chest 08/21/2018 FINDINGS: MRI THORACIC SPINE FINDINGS Alignment:  Physiologic. Vertebrae: No acute fracture. No discitis or osteomyelitis. Posterior T10 vertebral body bone lesion extending into the right pedicle and posterior elements and a 6 x 10 mm epidural tumor component along the right paracentral aspect of the vertebral body and extending along the roof of the right T10-11 neural foramen. Left posterior tenth rib expansile bone lesion consistent with metastatic disease. Left posterior eleventh rib bone lesion. Soft tissue tumor along the left posterior twelfth rib. Cord:  Normal signal and morphology. Paraspinal and other soft tissues: No acute paraspinal abnormality. Right upper lobe  airspace disease. Enlarged left periaortic lymph node measuring 2.5 cm. Disc levels: Disc spaces are maintained. No disc protrusion, foraminal stenosis or central canal stenosis. IMPRESSION: Posterior T10 vertebral body bone lesion extending into the right pedicle and posterior elements and a 6 x 10 mm epidural tumor component along the right paracentral aspect of the vertebral body and extending along the roof of the right T10-11 neural foramen. Left posterior tenth rib expansile bone lesion consistent with metastatic disease. Left posterior eleventh rib bone lesion. Soft tissue tumor along the left posterior twelfth rib. Electronically Signed   By: Kathreen Devoid   On: 01/17/2019 13:49    ASSESSMENT AND PLAN: This is a very pleasant 68 years old white female recently diagnosed with high-grade  neuroendocrine carcinoma. The patient underwent systemic chemotherapy with small cell regimen including carboplatin, etoposide and Tecentriq status post 2 cycles. She tolerated her treatment well but unfortunately repeat CT scan of the chest, abdomen and pelvis showed evidence for disease progression. The patient was started on systemic chemotherapy with carboplatin, Alimta and Keytruda status post 2 cycles.  She has been tolerating this treatment well with no concerning complaints. I recommended for her to proceed with cycle #3 today as scheduled. For the persistent back pain and T10 bone lesion with epidural extension, the patient will continue with the palliative radiotherapy as prescribed by Dr. Isidore Moos.  She will also continue her current pain medication with oxycodone and gabapentin. For the suspicious nonocclusive blood clot at the tip of the Port-A-Cath, she was started on Coumadin 5 mg p.o. daily except Monday and Thursday 2.5 mg p.o. daily.  Unfortunately her PT/INR still subtherapeutic.  I recommended for the patient to continue on Coumadin 5 mg daily at this point.  We will continue to monitor her PT/INR closely. The patient will come back for follow-up visit in 3 weeks for evaluation with repeat CT scan of the chest, abdomen and pelvis for restaging of her disease. She was advised to call immediately if she has any concerning symptoms in the interval. The patient voices understanding of current disease status and treatment options and is in agreement with the current care plan. All questions were answered. The patient knows to call the clinic with any problems, questions or concerns. We can certainly see the patient much sooner if necessary.  Disclaimer: This note was dictated with voice recognition software. Similar sounding words can inadvertently be transcribed and may not be corrected upon review.

## 2019-01-28 NOTE — Patient Instructions (Signed)
[facility] Discharge Instructions for Patients Receiving Chemotherapy  Today you received the following chemotherapy agents Keytruda, Alimta, Carboplatin.  To help prevent nausea and vomiting after your treatment, we encourage you to take your nausea medication as directed.  If you develop nausea and vomiting that is not controlled by your nausea medication, call the clinic.   BELOW ARE SYMPTOMS THAT SHOULD BE REPORTED IMMEDIATELY:  *FEVER GREATER THAN 100.5 F  *CHILLS WITH OR WITHOUT FEVER  NAUSEA AND VOMITING THAT IS NOT CONTROLLED WITH YOUR NAUSEA MEDICATION  *UNUSUAL SHORTNESS OF BREATH  *UNUSUAL BRUISING OR BLEEDING  TENDERNESS IN MOUTH AND THROAT WITH OR WITHOUT PRESENCE OF ULCERS  *URINARY PROBLEMS  *BOWEL PROBLEMS  UNUSUAL RASH Items with * indicate a potential emergency and should be followed up as soon as possible.  Feel free to call the clinic should you have any questions or concerns. The clinic phone number is [phone].  Please show the [facility] at check-in to the Emergency Department and triage nurse.

## 2019-01-29 ENCOUNTER — Other Ambulatory Visit: Payer: Self-pay

## 2019-01-29 ENCOUNTER — Ambulatory Visit
Admission: RE | Admit: 2019-01-29 | Discharge: 2019-01-29 | Disposition: A | Discharge status: Home / Self Care | Payer: Medicare Other | Source: Ambulatory Visit | Attending: Radiation Oncology | Admitting: Radiation Oncology

## 2019-01-29 DIAGNOSIS — C7951 Secondary malignant neoplasm of bone: Secondary | ICD-10-CM | POA: Diagnosis not present

## 2019-01-29 DIAGNOSIS — Z923 Personal history of irradiation: Secondary | ICD-10-CM | POA: Diagnosis not present

## 2019-01-30 ENCOUNTER — Other Ambulatory Visit: Payer: Self-pay | Admitting: Internal Medicine

## 2019-01-30 ENCOUNTER — Telehealth: Payer: Self-pay | Admitting: Internal Medicine

## 2019-01-30 ENCOUNTER — Ambulatory Visit
Admission: RE | Admit: 2019-01-30 | Discharge: 2019-01-30 | Disposition: A | Discharge status: Home / Self Care | Payer: Medicare Other | Source: Ambulatory Visit | Attending: Radiation Oncology | Admitting: Radiation Oncology

## 2019-01-30 ENCOUNTER — Telehealth: Payer: Self-pay | Admitting: Medical Oncology

## 2019-01-30 DIAGNOSIS — C7951 Secondary malignant neoplasm of bone: Secondary | ICD-10-CM

## 2019-01-30 DIAGNOSIS — Z923 Personal history of irradiation: Secondary | ICD-10-CM | POA: Diagnosis not present

## 2019-01-30 MED ORDER — OXYCODONE HCL 5 MG PO TABS: 5.0000 mg | ORAL_TABLET | Freq: Four times a day (QID) | ORAL | 0 refills | Status: DC | PRN

## 2019-01-30 NOTE — Telephone Encounter (Signed)
oxycodone refill-Per pt Dr Isidore Moos told her to stay on oxycodone because she can't tell her how long it will be before the radiation takes effect.  She said she currently takes percocet 2 tabs and gabapentin 1 tablet every 6-8 hour and it holds her pain .

## 2019-01-30 NOTE — Telephone Encounter (Signed)
Patient already on schedule as requested per 3/16 los. Central radiology will call patient re scan.

## 2019-01-31 ENCOUNTER — Ambulatory Visit
Admission: RE | Admit: 2019-01-31 | Discharge: 2019-01-31 | Disposition: A | Discharge status: Home / Self Care | Payer: Medicare Other | Source: Ambulatory Visit | Attending: Radiation Oncology | Admitting: Radiation Oncology

## 2019-01-31 ENCOUNTER — Other Ambulatory Visit: Payer: Self-pay

## 2019-01-31 ENCOUNTER — Other Ambulatory Visit: Payer: Self-pay | Admitting: Radiation Oncology

## 2019-01-31 ENCOUNTER — Telehealth: Payer: Self-pay | Admitting: Medical Oncology

## 2019-01-31 DIAGNOSIS — Z923 Personal history of irradiation: Secondary | ICD-10-CM | POA: Diagnosis not present

## 2019-01-31 DIAGNOSIS — C7951 Secondary malignant neoplasm of bone: Secondary | ICD-10-CM | POA: Diagnosis not present

## 2019-01-31 MED ORDER — OXYCODONE HCL 5 MG PO TABS: 5.0000 mg | ORAL_TABLET | Freq: Four times a day (QID) | ORAL | 0 refills | Status: DC | PRN

## 2019-01-31 NOTE — Telephone Encounter (Signed)
instructed pt to wait at elevators for xrt nurse re refill and increased dose of percocet. She did not pick up refill on 01/30/2019

## 2019-02-01 ENCOUNTER — Ambulatory Visit
Admission: RE | Admit: 2019-02-01 | Discharge: 2019-02-01 | Disposition: A | Discharge status: Home / Self Care | Payer: Medicare Other | Source: Ambulatory Visit | Attending: Radiation Oncology | Admitting: Radiation Oncology

## 2019-02-01 ENCOUNTER — Other Ambulatory Visit: Payer: Self-pay

## 2019-02-01 DIAGNOSIS — C7951 Secondary malignant neoplasm of bone: Secondary | ICD-10-CM | POA: Diagnosis not present

## 2019-02-01 DIAGNOSIS — Z923 Personal history of irradiation: Secondary | ICD-10-CM | POA: Diagnosis not present

## 2019-02-04 ENCOUNTER — Ambulatory Visit
Admission: RE | Admit: 2019-02-04 | Discharge: 2019-02-04 | Disposition: A | Discharge status: Home / Self Care | Payer: Medicare Other | Source: Ambulatory Visit | Attending: Radiation Oncology | Admitting: Radiation Oncology

## 2019-02-04 ENCOUNTER — Other Ambulatory Visit: Payer: Medicare Other

## 2019-02-04 ENCOUNTER — Other Ambulatory Visit: Payer: Self-pay | Admitting: Radiation Oncology

## 2019-02-04 ENCOUNTER — Other Ambulatory Visit: Payer: Self-pay

## 2019-02-04 ENCOUNTER — Inpatient Hospital Stay: Payer: Medicare Other

## 2019-02-04 DIAGNOSIS — C7B8 Other secondary neuroendocrine tumors: Secondary | ICD-10-CM | POA: Diagnosis not present

## 2019-02-04 DIAGNOSIS — C7951 Secondary malignant neoplasm of bone: Secondary | ICD-10-CM

## 2019-02-04 DIAGNOSIS — Z5111 Encounter for antineoplastic chemotherapy: Secondary | ICD-10-CM | POA: Diagnosis not present

## 2019-02-04 DIAGNOSIS — Z5112 Encounter for antineoplastic immunotherapy: Secondary | ICD-10-CM | POA: Diagnosis not present

## 2019-02-04 DIAGNOSIS — R5382 Chronic fatigue, unspecified: Secondary | ICD-10-CM

## 2019-02-04 DIAGNOSIS — C3492 Malignant neoplasm of unspecified part of left bronchus or lung: Secondary | ICD-10-CM

## 2019-02-04 DIAGNOSIS — T85818D Embolism due to other internal prosthetic devices, implants and grafts, subsequent encounter: Secondary | ICD-10-CM

## 2019-02-04 DIAGNOSIS — N189 Chronic kidney disease, unspecified: Secondary | ICD-10-CM | POA: Diagnosis not present

## 2019-02-04 DIAGNOSIS — C7A1 Malignant poorly differentiated neuroendocrine tumors: Secondary | ICD-10-CM | POA: Diagnosis not present

## 2019-02-04 DIAGNOSIS — Z923 Personal history of irradiation: Secondary | ICD-10-CM | POA: Diagnosis not present

## 2019-02-04 DIAGNOSIS — G893 Neoplasm related pain (acute) (chronic): Secondary | ICD-10-CM | POA: Diagnosis not present

## 2019-02-04 LAB — CBC WITH DIFFERENTIAL (CANCER CENTER ONLY)
Abs Immature Granulocytes: 0.03 10*3/uL (ref 0.00–0.07)
Basophils Absolute: 0 10*3/uL (ref 0.0–0.1)
Basophils Relative: 1 %
EOS ABS: 0 10*3/uL (ref 0.0–0.5)
Eosinophils Relative: 1 %
HCT: 32.8 % — ABNORMAL LOW (ref 36.0–46.0)
Hemoglobin: 10.6 g/dL — ABNORMAL LOW (ref 12.0–15.0)
Immature Granulocytes: 2 %
Lymphocytes Relative: 34 %
Lymphs Abs: 0.6 10*3/uL — ABNORMAL LOW (ref 0.7–4.0)
MCH: 32.5 pg (ref 26.0–34.0)
MCHC: 32.3 g/dL (ref 30.0–36.0)
MCV: 100.6 fL — AB (ref 80.0–100.0)
MONOS PCT: 4 %
Monocytes Absolute: 0.1 10*3/uL (ref 0.1–1.0)
Neutro Abs: 1.1 10*3/uL — ABNORMAL LOW (ref 1.7–7.7)
Neutrophils Relative %: 58 %
Platelet Count: 213 10*3/uL (ref 150–400)
RBC: 3.26 MIL/uL — ABNORMAL LOW (ref 3.87–5.11)
RDW: 17.3 % — ABNORMAL HIGH (ref 11.5–15.5)
WBC Count: 1.9 10*3/uL — ABNORMAL LOW (ref 4.0–10.5)
nRBC: 0 % (ref 0.0–0.2)

## 2019-02-04 LAB — CMP (CANCER CENTER ONLY)
ALT: 16 U/L (ref 0–44)
AST: 19 U/L (ref 15–41)
Albumin: 3.3 g/dL — ABNORMAL LOW (ref 3.5–5.0)
Alkaline Phosphatase: 92 U/L (ref 38–126)
Anion gap: 12 (ref 5–15)
BUN: 18 mg/dL (ref 8–23)
CO2: 23 mmol/L (ref 22–32)
Calcium: 8.6 mg/dL — ABNORMAL LOW (ref 8.9–10.3)
Chloride: 103 mmol/L (ref 98–111)
Creatinine: 0.75 mg/dL (ref 0.44–1.00)
GFR, Est AFR Am: >60 mL/min (ref >60)
GFR, Estimated: >60 mL/min (ref >60)
Glucose, Bld: 109 mg/dL — ABNORMAL HIGH (ref 70–99)
POTASSIUM: 4.2 mmol/L (ref 3.5–5.1)
Sodium: 138 mmol/L (ref 135–145)
Total Bilirubin: 0.6 mg/dL (ref 0.3–1.2)
Total Protein: 7 g/dL (ref 6.5–8.1)

## 2019-02-04 LAB — PROTIME-INR
INR: 2.6 — ABNORMAL HIGH (ref 0.8–1.2)
PROTHROMBIN TIME: 27.1 s — AB (ref 11.4–15.2)

## 2019-02-04 LAB — TSH: TSH: 1.149 u[IU]/mL (ref 0.308–3.960)

## 2019-02-04 MED ORDER — OXYCODONE HCL 5 MG PO TABS: 5.0000 mg | ORAL_TABLET | ORAL | 0 refills | Status: DC | PRN

## 2019-02-05 ENCOUNTER — Other Ambulatory Visit: Payer: Self-pay | Admitting: Radiation Oncology

## 2019-02-05 ENCOUNTER — Ambulatory Visit
Admission: RE | Admit: 2019-02-05 | Discharge: 2019-02-05 | Disposition: A | Discharge status: Home / Self Care | Payer: Medicare Other | Source: Ambulatory Visit | Attending: Radiation Oncology | Admitting: Radiation Oncology

## 2019-02-05 ENCOUNTER — Other Ambulatory Visit: Payer: Self-pay

## 2019-02-05 DIAGNOSIS — C7951 Secondary malignant neoplasm of bone: Secondary | ICD-10-CM | POA: Diagnosis not present

## 2019-02-05 DIAGNOSIS — Z923 Personal history of irradiation: Secondary | ICD-10-CM | POA: Diagnosis not present

## 2019-02-05 MED ORDER — GABAPENTIN 300 MG PO CAPS: 300.0000 mg | ORAL_CAPSULE | Freq: Three times a day (TID) | ORAL | 0 refills | Status: DC

## 2019-02-06 ENCOUNTER — Other Ambulatory Visit: Payer: Self-pay

## 2019-02-06 ENCOUNTER — Ambulatory Visit
Admission: RE | Admit: 2019-02-06 | Discharge: 2019-02-06 | Disposition: A | Discharge status: Home / Self Care | Payer: Medicare Other | Source: Ambulatory Visit | Attending: Radiation Oncology | Admitting: Radiation Oncology

## 2019-02-06 DIAGNOSIS — C7951 Secondary malignant neoplasm of bone: Secondary | ICD-10-CM | POA: Diagnosis not present

## 2019-02-06 DIAGNOSIS — Z923 Personal history of irradiation: Secondary | ICD-10-CM | POA: Diagnosis not present

## 2019-02-07 ENCOUNTER — Other Ambulatory Visit: Payer: Self-pay

## 2019-02-07 ENCOUNTER — Ambulatory Visit
Admission: RE | Admit: 2019-02-07 | Discharge: 2019-02-07 | Disposition: A | Discharge status: Home / Self Care | Payer: Medicare Other | Source: Ambulatory Visit | Attending: Radiation Oncology | Admitting: Radiation Oncology

## 2019-02-07 DIAGNOSIS — C7951 Secondary malignant neoplasm of bone: Secondary | ICD-10-CM | POA: Diagnosis not present

## 2019-02-07 DIAGNOSIS — Z923 Personal history of irradiation: Secondary | ICD-10-CM | POA: Diagnosis not present

## 2019-02-08 ENCOUNTER — Other Ambulatory Visit: Payer: Self-pay

## 2019-02-08 ENCOUNTER — Encounter: Payer: Self-pay | Admitting: Radiation Oncology

## 2019-02-08 ENCOUNTER — Ambulatory Visit
Admission: RE | Admit: 2019-02-08 | Discharge: 2019-02-08 | Disposition: A | Discharge status: Home / Self Care | Payer: Medicare Other | Source: Ambulatory Visit | Attending: Radiation Oncology | Admitting: Radiation Oncology

## 2019-02-08 ENCOUNTER — Telehealth: Payer: Self-pay | Admitting: General Practice

## 2019-02-08 DIAGNOSIS — Z923 Personal history of irradiation: Secondary | ICD-10-CM | POA: Diagnosis not present

## 2019-02-08 DIAGNOSIS — C7951 Secondary malignant neoplasm of bone: Secondary | ICD-10-CM | POA: Diagnosis not present

## 2019-02-08 NOTE — Telephone Encounter (Signed)
[facility] CSW Progress Notes  Referral received from Dr Isidore Moos - patient may need additional support.  Called and left VM w my contact information and encouraged patient to call and schedule phone visit as needed.  Edwyna Shell, LCSW Clinical Social Worker Phone:  [phone]

## 2019-02-11 ENCOUNTER — Inpatient Hospital Stay: Payer: Medicare Other

## 2019-02-11 ENCOUNTER — Telehealth: Payer: Self-pay | Admitting: Medical Oncology

## 2019-02-11 ENCOUNTER — Other Ambulatory Visit: Payer: Self-pay

## 2019-02-11 ENCOUNTER — Other Ambulatory Visit: Payer: Medicare Other

## 2019-02-11 DIAGNOSIS — C7A1 Malignant poorly differentiated neuroendocrine tumors: Secondary | ICD-10-CM | POA: Diagnosis not present

## 2019-02-11 DIAGNOSIS — Z5111 Encounter for antineoplastic chemotherapy: Secondary | ICD-10-CM | POA: Diagnosis not present

## 2019-02-11 DIAGNOSIS — C3492 Malignant neoplasm of unspecified part of left bronchus or lung: Secondary | ICD-10-CM

## 2019-02-11 DIAGNOSIS — Z5112 Encounter for antineoplastic immunotherapy: Secondary | ICD-10-CM | POA: Diagnosis not present

## 2019-02-11 DIAGNOSIS — G893 Neoplasm related pain (acute) (chronic): Secondary | ICD-10-CM | POA: Diagnosis not present

## 2019-02-11 DIAGNOSIS — T85818D Embolism due to other internal prosthetic devices, implants and grafts, subsequent encounter: Secondary | ICD-10-CM

## 2019-02-11 DIAGNOSIS — C7B8 Other secondary neuroendocrine tumors: Secondary | ICD-10-CM | POA: Diagnosis not present

## 2019-02-11 DIAGNOSIS — N189 Chronic kidney disease, unspecified: Secondary | ICD-10-CM | POA: Diagnosis not present

## 2019-02-11 LAB — CMP (CANCER CENTER ONLY)
ALT: 13 U/L (ref 0–44)
AST: 17 U/L (ref 15–41)
Albumin: 2.9 g/dL — ABNORMAL LOW (ref 3.5–5.0)
Alkaline Phosphatase: 82 U/L (ref 38–126)
Anion gap: 8 (ref 5–15)
BUN: 13 mg/dL (ref 8–23)
CO2: 26 mmol/L (ref 22–32)
Calcium: 8.2 mg/dL — ABNORMAL LOW (ref 8.9–10.3)
Chloride: 103 mmol/L (ref 98–111)
Creatinine: 0.78 mg/dL (ref 0.44–1.00)
GFR, Est AFR Am: >60 mL/min (ref >60)
Glucose, Bld: 146 mg/dL — ABNORMAL HIGH (ref 70–99)
Potassium: 3.4 mmol/L — ABNORMAL LOW (ref 3.5–5.1)
Sodium: 137 mmol/L (ref 135–145)
Total Bilirubin: 0.4 mg/dL (ref 0.3–1.2)
Total Protein: 6.6 g/dL (ref 6.5–8.1)

## 2019-02-11 LAB — CBC WITH DIFFERENTIAL (CANCER CENTER ONLY)
Abs Immature Granulocytes: 0.01 10*3/uL (ref 0.00–0.07)
Basophils Absolute: 0 10*3/uL (ref 0.0–0.1)
Basophils Relative: 0 %
EOS PCT: 1 %
Eosinophils Absolute: 0 10*3/uL (ref 0.0–0.5)
HEMATOCRIT: 27.6 % — AB (ref 36.0–46.0)
Hemoglobin: 8.8 g/dL — ABNORMAL LOW (ref 12.0–15.0)
Immature Granulocytes: 1 %
LYMPHS ABS: 0.3 10*3/uL — AB (ref 0.7–4.0)
Lymphocytes Relative: 23 %
MCH: 32.5 pg (ref 26.0–34.0)
MCHC: 31.9 g/dL (ref 30.0–36.0)
MCV: 101.8 fL — ABNORMAL HIGH (ref 80.0–100.0)
Monocytes Absolute: 0.4 10*3/uL (ref 0.1–1.0)
Monocytes Relative: 30 %
Neutro Abs: 0.7 10*3/uL — ABNORMAL LOW (ref 1.7–7.7)
Neutrophils Relative %: 45 %
Platelet Count: 58 10*3/uL — ABNORMAL LOW (ref 150–400)
RBC: 2.71 MIL/uL — ABNORMAL LOW (ref 3.87–5.11)
RDW: 17.7 % — ABNORMAL HIGH (ref 11.5–15.5)
WBC Count: 1.5 10*3/uL — ABNORMAL LOW (ref 4.0–10.5)
nRBC: 0 % (ref 0.0–0.2)

## 2019-02-11 LAB — PROTIME-INR
INR: 3.1 — ABNORMAL HIGH (ref 0.8–1.2)
Prothrombin Time: 31.8 seconds — ABNORMAL HIGH (ref 11.4–15.2)

## 2019-02-11 NOTE — Telephone Encounter (Signed)
pain with swallowing. Some fluids back up. Describes as " tightness behind breastbone".  Denies vomiting.  intermittent low grade fevers . 99.7 is the highest. Both started 4 days ago.

## 2019-02-12 ENCOUNTER — Telehealth: Payer: Self-pay | Admitting: General Practice

## 2019-02-12 ENCOUNTER — Other Ambulatory Visit: Payer: Self-pay | Admitting: Internal Medicine

## 2019-02-12 NOTE — Progress Notes (Signed)
  Radiation Oncology         [phone] ________________________________  Name: Alison Sullivan MRN: [id number]  Date: 02/08/2019  DOB: 1951-07-07  End of Treatment Note  Diagnosis:   68 y.o. female with Stage IV lung cancer with metastatic disease to the thoracic spine and ribs  Indication for treatment:  Palliative       Radiation treatment dates:   01/28/2019 - 02/08/2019  Site/dose:   Thoracic Spine, directed to T10 with margin, and adjacent posterior symptomatic rib mets / 30 Gy in 10 fractions of 3 Gy  Beams/energy:   IMRT / 6X Photon  Narrative: The patient tolerated radiation treatment relatively well.   She experienced mild fatigue and developed some skin dryness on her back. She continues to have significant back pain. I have increased the frequency of her oxycodone to 5-10mg  every 4 hours; Dr Julien Nordmann will manage her pain in the long-term.   Plan: The patient has completed radiation treatment. The patient will return to radiation oncology clinic for routine followup in one month. I advised them to call or return sooner if they have any questions or concerns related to their recovery or treatment.  -----------------------------------  [name], MD  This document serves as a record of services personally performed by [name], MD. It was created on her behalf by Rae Lips, a trained medical scribe. The creation of this record is based on the scribe's personal observations and the provider's statements to them. This document has been checked and approved by the attending provider.

## 2019-02-12 NOTE — Telephone Encounter (Signed)
[place] CSW Progress Notes  Second call to patient to offer support/resources. Unable to reach, left VM w my contact information and encouragement to call back.  Edwyna Shell, LCSW Clinical Social Worker Phone:  [phone]

## 2019-02-13 ENCOUNTER — Inpatient Hospital Stay: Payer: Medicare Other | Attending: Internal Medicine | Admitting: Medical

## 2019-02-13 ENCOUNTER — Other Ambulatory Visit: Payer: Self-pay | Admitting: Medical

## 2019-02-13 ENCOUNTER — Ambulatory Visit: Payer: Medicare Other | Admitting: Medical

## 2019-02-13 ENCOUNTER — Telehealth: Payer: Self-pay | Admitting: Medical Oncology

## 2019-02-13 ENCOUNTER — Other Ambulatory Visit: Payer: Self-pay

## 2019-02-13 VITALS — BP 108/71 | HR 100 | Temp 98.9°F | Resp 18 | Ht 66.0 in | Wt 147.7 lb

## 2019-02-13 DIAGNOSIS — Z79899 Other long term (current) drug therapy: Secondary | ICD-10-CM | POA: Insufficient documentation

## 2019-02-13 DIAGNOSIS — Z923 Personal history of irradiation: Secondary | ICD-10-CM | POA: Diagnosis not present

## 2019-02-13 DIAGNOSIS — Z5112 Encounter for antineoplastic immunotherapy: Secondary | ICD-10-CM | POA: Diagnosis not present

## 2019-02-13 DIAGNOSIS — R112 Nausea with vomiting, unspecified: Secondary | ICD-10-CM | POA: Insufficient documentation

## 2019-02-13 DIAGNOSIS — M8580 Other specified disorders of bone density and structure, unspecified site: Secondary | ICD-10-CM | POA: Insufficient documentation

## 2019-02-13 DIAGNOSIS — C7951 Secondary malignant neoplasm of bone: Secondary | ICD-10-CM

## 2019-02-13 DIAGNOSIS — C7B8 Other secondary neuroendocrine tumors: Secondary | ICD-10-CM | POA: Insufficient documentation

## 2019-02-13 DIAGNOSIS — Z7901 Long term (current) use of anticoagulants: Secondary | ICD-10-CM | POA: Diagnosis not present

## 2019-02-13 DIAGNOSIS — N189 Chronic kidney disease, unspecified: Secondary | ICD-10-CM

## 2019-02-13 DIAGNOSIS — Z87891 Personal history of nicotine dependence: Secondary | ICD-10-CM | POA: Diagnosis not present

## 2019-02-13 DIAGNOSIS — K209 Esophagitis, unspecified without bleeding: Secondary | ICD-10-CM

## 2019-02-13 DIAGNOSIS — K5792 Diverticulitis of intestine, part unspecified, without perforation or abscess without bleeding: Secondary | ICD-10-CM | POA: Diagnosis not present

## 2019-02-13 DIAGNOSIS — C3492 Malignant neoplasm of unspecified part of left bronchus or lung: Secondary | ICD-10-CM

## 2019-02-13 DIAGNOSIS — C7A1 Malignant poorly differentiated neuroendocrine tumors: Secondary | ICD-10-CM | POA: Diagnosis not present

## 2019-02-13 DIAGNOSIS — Z5111 Encounter for antineoplastic chemotherapy: Secondary | ICD-10-CM | POA: Insufficient documentation

## 2019-02-13 DIAGNOSIS — G893 Neoplasm related pain (acute) (chronic): Secondary | ICD-10-CM | POA: Insufficient documentation

## 2019-02-13 MED ORDER — SUCRALFATE 1 G PO TABS: 1.0000 g | ORAL_TABLET | Freq: Three times a day (TID) | ORAL | 1 refills | Status: DC

## 2019-02-13 MED ORDER — OMEPRAZOLE 40 MG PO CPDR: 40.0000 mg | DELAYED_RELEASE_CAPSULE | Freq: Every day | ORAL | 2 refills | Status: DC

## 2019-02-13 MED ORDER — LIDOCAINE VISCOUS HCL 2 % MT SOLN: 5.0000 mL | OROMUCOSAL | 1 refills | Status: DC | PRN

## 2019-02-13 NOTE — Progress Notes (Signed)
Pt states that son went to [place] in January, started having SOB and cough in February but was not tested for Covid-19.  Self quarantined, did not see pt for several weeks until this past Sunday.  Pt denies cough.  Temperatures have stayed below 99.7 consistently, current afebrile.  SOB at baseline d/t disease.  Reports aching/burning pain "like reflux" in center of chest where she received radiation, as well as chest congestion and runny nose.  Denies any other issues.  PA Sandi Mealy aware, MD [facility] aware.  Pt not being placed on Covid-19 precautions at this time per MD [facility] and PA Lucianne Lei.

## 2019-02-13 NOTE — Progress Notes (Signed)
[facility] Progress Note   Alison Sullivan [id number] 05-Sep-1951 68 y.o.  [name] is managed by Dr. Fanny Bien. 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  She is status post cycle 3 of Alimta and Keytruda which was dosed on 01/28/2019.  She will see Dr. Julien Nordmann in follow-up on 02/18/2019.  Esophagitis secondary to radiation therapy to the thoracic spine: The patient was given a prescription for Carafate, viscous lidocaine, and omeprazole.  Please see After Visit Summary for patient specific instructions.  Future Appointments  Date Time Provider [place]  02/15/2019 12:30 PM [facility] 2 [facility]   02/18/2019  9:15 AM Curt Bears, MD [facility] None  02/18/2019  9:45 AM [facility] LAB 1 [facility] None  02/18/2019 10:15 AM [facility] INFUSION [facility] None  02/25/2019 12:15 PM [facility] ONLY [facility] None  03/04/2019 12:15 PM [facility] ONLY [facility] None  03/11/2019  9:00 AM [facility] ONLY [facility] None  03/11/2019  9:45 AM Curt Bears, MD [facility] None  03/11/2019 10:30 AM [facility] INFUSION [facility] None    No orders of the defined types were placed in this encounter.      Subjective:   Patient ID:  Alison Sullivan is a 68 y.o. (DOB 13-Dec-1950) female.  Chief Complaint:  Chief Complaint  Patient presents with  . 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  She originally presented with a left upper lobe lung nodule in addition to mediastinal lymphadenopathy bony metastatic disease.  She has most recently been treated with Alimta and Keytruda.  She has recently been treated with radiation therapy to the thoracic spine and adjacent posterior ribs.  Her radiation was dosed from 01/28/2019 - 02/08/2019.  She contacted her office today with a report that she was having pain with swallowing and a low-grade fever.  She reports that she had contacted radiation therapy regarding her pain with swallowing and was told that it was not related to her recently completed radiation of the thoracic spine.  The patient reports that she has been having an ongoing low-grade fever for several weeks with her fever not being higher than 99.7.  She was concerned as her son was in [place] and [place] in January and had respiratory symptoms on his return.  He quarantined himself from his mother and only saw her last Sunday.  He has no symptoms at this time.  She had labs completed on 02/11/2019 which returned showing a WBC of 1.5 and an ANC of 0.7.  Medications: I have reviewed the patient's current medications.  Allergies: No Known Allergies  Past Medical History:  Diagnosis Date  . Arthritis   . Chronic kidney disease   . Dog bite of wrist   . History of kidney stones   . History of radiation therapy 10/23/18- 11/06/18   Left lower rib  mass/ 30 Gy delivered in 10 fractions 3 Gy.   . Kidney infection   . Left renal mass   . Osteopenia   . Pyelonephritis   . Recurrent UTI (urinary tract infection)     Past Surgical History:  Procedure Laterality Date  . CYSTOSCOPY WITH RETROGRADE PYELOGRAM, URETEROSCOPY AND STENT PLACEMENT Left 12/28/2017   Procedure: CYSTOSCOPY WITH RETROGRADE PYELOGRAM, URETEROSCOPY  AND STENT PLACEMENT, STONE BASKETRY;  Surgeon: Festus Aloe, MD;  Location: [facility];  Service: Urology;  Laterality: Left;  . FINGER SURGERY     index, growth removal  . HOLMIUM LASER APPLICATION Left 7/82/9562   Procedure: HOLMIUM LASER APPLICATION;  Surgeon: Festus Aloe, MD;  Location: [facility];  Service: Urology;  Laterality: Left;  . IR IMAGING GUIDED PORT INSERTION  10/31/2018  . KIDNEY STONE SURGERY  2011 or 2012  . LITHOTRIPSY  2007    Family History  Problem Relation Age of Onset  . Cancer Father        pancreatic  . Melanoma Father     Social History   Socioeconomic History  . Marital status: Single    Spouse name: Not on file  . Number of children: Not on file  . Years of education: Not on file  . Highest education level: Not on file  Occupational History  . Not on file  Social Needs  . Financial resource strain: Not on file  . Food insecurity:    Worry: Not on file    Inability: Not on file  . Transportation needs:    Medical: No    Non-medical: No  Tobacco Use  . Smoking status: Former Smoker    Packs/day: 0.25    Years: 30.00    Pack years: 7.50    Types: Cigarettes  . Smokeless tobacco: Never Used  . Tobacco comment: She quit early December 2019  Substance and Sexual Activity  . Alcohol use: Yes    Comment: occ  . Drug use: No  . Sexual activity: Not on file  Lifestyle  . Physical activity:    Days per week: Not on file    Minutes per session: Not on file  . Stress: Not on file  Relationships  . Social connections:    Talks on phone: Not on file    Gets together: Not on file    Attends religious service: Not on file    Active member of club or organization: Not on file    Attends meetings of clubs or organizations: Not on file    Relationship status: Not on file  . Intimate partner violence:    Fear of current or ex partner: No    Emotionally abused: No    Physically abused: No    Forced  sexual activity: No  Other Topics Concern  . Not on file  Social History Narrative  . Not on file    Past Medical History, Surgical history, Social history, and Family history were reviewed and updated as appropriate.   Please see review of systems for further details on the patient's review from today.   Review of Systems:  Review of Systems  Constitutional: Positive for appetite change. Negative for activity change, chills, diaphoresis, fatigue, fever and unexpected weight change.  HENT: Positive for trouble swallowing.   Respiratory: Negative for choking and shortness of breath.   Cardiovascular: Negative for chest pain.  Gastrointestinal: Negative for nausea and vomiting.    Objective:   Physical Exam:  BP  108/71 (BP Location: Left Arm, Patient Position: Sitting)   Pulse 100   Temp 98.9 F (37.2 C) (Oral)   Resp 18   Ht 5\' 6"  (1.676 m)   Wt 147 lb 11.2 oz (67 kg)   SpO2 97%   BMI 23.84 kg/m  ECOG: 1  Physical Exam Constitutional:      General: She is not in acute distress.    Appearance: She is not diaphoretic.  HENT:     Head: Normocephalic and atraumatic.  Eyes:     General: No scleral icterus.       Right eye: No discharge.        Left eye: No discharge.     Conjunctiva/sclera: Conjunctivae normal.  Cardiovascular:     Rate and Rhythm: Normal rate and regular rhythm.     Heart sounds: Normal heart sounds. No murmur. No friction rub. No gallop.   Pulmonary:     Effort: Pulmonary effort is normal. No respiratory distress.     Breath sounds: Normal breath sounds. No wheezing or rales.  Skin:    General: Skin is warm and dry.     Findings: No erythema or rash.  Neurological:     Mental Status: She is alert.     Gait: Gait normal.  Psychiatric:        Mood and Affect: Mood normal.        Behavior: Behavior normal.        Thought Content: Thought content normal.        Judgment: Judgment normal.     Lab Review:     Component Value Date/Time   NA  137 02/11/2019 1146   K 3.4 (L) 02/11/2019 1146   CL 103 02/11/2019 1146   CO2 26 02/11/2019 1146   GLUCOSE 146 (H) 02/11/2019 1146   BUN 13 02/11/2019 1146   CREATININE 0.78 02/11/2019 1146   CALCIUM 8.2 (L) 02/11/2019 1146   PROT 6.6 02/11/2019 1146   ALBUMIN 2.9 (L) 02/11/2019 1146   AST 17 02/11/2019 1146   ALT 13 02/11/2019 1146   ALKPHOS 82 02/11/2019 1146   BILITOT 0.4 02/11/2019 1146   GFRNONAA >60 02/11/2019 1146   GFRAA >60 02/11/2019 1146       Component Value Date/Time   WBC 1.5 (L) 02/11/2019 1146   WBC 12.9 (H) 10/31/2018 0825   RBC 2.71 (L) 02/11/2019 1146   HGB 8.8 (L) 02/11/2019 1146   HCT 27.6 (L) 02/11/2019 1146   PLT 58 (L) 02/11/2019 1146   MCV 101.8 (H) 02/11/2019 1146   MCH 32.5 02/11/2019 1146   MCHC 31.9 02/11/2019 1146   RDW 17.7 (H) 02/11/2019 1146   LYMPHSABS 0.3 (L) 02/11/2019 1146   MONOABS 0.4 02/11/2019 1146   EOSABS 0.0 02/11/2019 1146   BASOSABS 0.0 02/11/2019 1146   -------------------------------  Imaging from last 24 hours (if applicable):  Radiology interpretation: Mr Thoracic Spine W Wo Contrast  Result Date: 01/17/2019 CLINICAL DATA:  Stage IV (T1b, N2, M1c) 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 Known mets to 6th and 10th rib, pt c/o back pain EXAM: MRI THORACIC WITHOUT AND WITH CONTRAST TECHNIQUE: Multiplanar and multiecho pulse sequences of the thoracic spine were obtained without and with intravenous contrast. CONTRAST:  6 mL Gadavist COMPARISON:  CT chest 08/21/2018 FINDINGS: MRI THORACIC SPINE FINDINGS Alignment:  Physiologic. Vertebrae: No acute fracture. No discitis or osteomyelitis. Posterior T10 vertebral body bone lesion extending into the right pedicle  and posterior elements and a 6 x 10 mm epidural tumor component along the right paracentral aspect of the vertebral body and extending along the roof of the right T10-11 neural foramen. Left posterior tenth rib  expansile bone lesion consistent with metastatic disease. Left posterior eleventh rib bone lesion. Soft tissue tumor along the left posterior twelfth rib. Cord:  Normal signal and morphology. Paraspinal and other soft tissues: No acute paraspinal abnormality. Right upper lobe airspace disease. Enlarged left periaortic lymph node measuring 2.5 cm. Disc levels: Disc spaces are maintained. No disc protrusion, foraminal stenosis or central canal stenosis. IMPRESSION: Posterior T10 vertebral body bone lesion extending into the right pedicle and posterior elements and a 6 x 10 mm epidural tumor component along the right paracentral aspect of the vertebral body and extending along the roof of the right T10-11 neural foramen. Left posterior tenth rib expansile bone lesion consistent with metastatic disease. Left posterior eleventh rib bone lesion. Soft tissue tumor along the left posterior twelfth rib. Electronically Signed   By: Kathreen Devoid   On: 01/17/2019 13:49        This case was discussed with Dr. Julien Nordmann. He expressed agreement with my management of this patient.

## 2019-02-13 NOTE — Patient Instructions (Signed)

## 2019-02-13 NOTE — Telephone Encounter (Signed)
Pain with swalowing persists ,low grade fever. Appt today in [facility] done yesterday.neutropenic. Pt scheduled today for [facility].

## 2019-02-14 ENCOUNTER — Encounter: Payer: Self-pay | Admitting: Pharmacist

## 2019-02-14 NOTE — Telephone Encounter (Signed)
Please advise 91-day supply request.

## 2019-02-15 ENCOUNTER — Other Ambulatory Visit: Payer: Self-pay

## 2019-02-15 ENCOUNTER — Other Ambulatory Visit: Payer: Self-pay | Admitting: Medical Oncology

## 2019-02-15 ENCOUNTER — Inpatient Hospital Stay: Payer: Medicare Other

## 2019-02-15 ENCOUNTER — Encounter (HOSPITAL_COMMUNITY): Payer: Self-pay

## 2019-02-15 ENCOUNTER — Other Ambulatory Visit: Payer: Self-pay | Admitting: Physician Assistant

## 2019-02-15 ENCOUNTER — Telehealth: Payer: Self-pay | Admitting: Physician Assistant

## 2019-02-15 ENCOUNTER — Ambulatory Visit (HOSPITAL_COMMUNITY)
Admission: RE | Admit: 2019-02-15 | Discharge: 2019-02-15 | Disposition: A | Discharge status: Home / Self Care | Payer: Medicare Other | Source: Ambulatory Visit | Attending: Internal Medicine | Admitting: Internal Medicine

## 2019-02-15 ENCOUNTER — Telehealth: Payer: Self-pay | Admitting: Medical Oncology

## 2019-02-15 DIAGNOSIS — R300 Dysuria: Secondary | ICD-10-CM

## 2019-02-15 DIAGNOSIS — C7A1 Malignant poorly differentiated neuroendocrine tumors: Secondary | ICD-10-CM | POA: Diagnosis not present

## 2019-02-15 DIAGNOSIS — Z5111 Encounter for antineoplastic chemotherapy: Secondary | ICD-10-CM | POA: Diagnosis not present

## 2019-02-15 DIAGNOSIS — K209 Esophagitis, unspecified: Secondary | ICD-10-CM | POA: Diagnosis not present

## 2019-02-15 DIAGNOSIS — N390 Urinary tract infection, site not specified: Secondary | ICD-10-CM

## 2019-02-15 DIAGNOSIS — C3490 Malignant neoplasm of unspecified part of unspecified bronchus or lung: Secondary | ICD-10-CM | POA: Insufficient documentation

## 2019-02-15 DIAGNOSIS — Z5112 Encounter for antineoplastic immunotherapy: Secondary | ICD-10-CM | POA: Diagnosis not present

## 2019-02-15 DIAGNOSIS — N189 Chronic kidney disease, unspecified: Secondary | ICD-10-CM | POA: Diagnosis not present

## 2019-02-15 DIAGNOSIS — C7B8 Other secondary neuroendocrine tumors: Secondary | ICD-10-CM | POA: Diagnosis not present

## 2019-02-15 LAB — URINALYSIS, COMPLETE (UACMP) WITH MICROSCOPIC
Bilirubin Urine: NEGATIVE
Glucose, UA: NEGATIVE mg/dL
Ketones, ur: NEGATIVE mg/dL
Nitrite: NEGATIVE
Protein, ur: 30 mg/dL — AB
Specific Gravity, Urine: 1.041 — ABNORMAL HIGH (ref 1.005–1.030)
WBC, UA: >50 WBC/hpf — ABNORMAL HIGH (ref 0–5)
pH: 7 (ref 5.0–8.0)

## 2019-02-15 MED ORDER — NITROFURANTOIN MONOHYD MACRO 100 MG PO CAPS: 100.0000 mg | ORAL_CAPSULE | Freq: Two times a day (BID) | ORAL | 0 refills | Status: DC

## 2019-02-15 MED ORDER — IOHEXOL 300 MG/ML  SOLN
100.0000 mL | Freq: Once | INTRAMUSCULAR | Status: AC | PRN
  Administered 2019-02-15: 13:00:00 100 mL via INTRAVENOUS

## 2019-02-15 MED ORDER — SODIUM CHLORIDE (PF) 0.9 % IJ SOLN
INTRAMUSCULAR | Status: AC
  Filled 2019-02-15: qty 50

## 2019-02-15 NOTE — Telephone Encounter (Signed)
Spoke to patient regarding her hx of recurrent UTI's and pyelonephritis. The patient was somewhat short-tempered on the phone. Recent imaging shows new 3 mm kidney stone and evidence of ascending right urinary tract infection. Patient informed me she has a urologist that she has been following with for 20 years. Urged the patient to make an appointment to follow up with her urologist. She was given a prescription of anti-biotics today. Will consider changing anti-biotics pending the culture and sensitivity results if needed. She knows signs and symptoms to watch out for which would warrant urgent evaluation such as fever, nausea/vomiting, new and worsening back pain. The patient expressed understanding.

## 2019-02-15 NOTE — Telephone Encounter (Signed)
Patient called today expressing concern for dysuria. Urinalysis shows urinary tract infection.The patient is currently taking warfarin. Given the concern for drug-drug interactions with warfarin and Cipro or bactrim, the patient was given a prescription for macrobid. We will obtain a culture and sensitivity. If needed, we will change her antibiotics pending the results. She knows to seek evaluation if she starts demonstrating signs and symptoms of pyelonephritis including fever, back pain, worsening UTI symptoms, or N/V. She expressed understanding. All questions were answered.

## 2019-02-15 NOTE — Telephone Encounter (Signed)
New dysuria. Hx of UTI - Has taken CIPRO in past.

## 2019-02-17 LAB — URINE CULTURE: Culture: >=100000 — AB

## 2019-02-18 ENCOUNTER — Other Ambulatory Visit: Payer: Self-pay

## 2019-02-18 ENCOUNTER — Encounter: Payer: Self-pay | Admitting: *Deleted

## 2019-02-18 ENCOUNTER — Inpatient Hospital Stay (HOSPITAL_BASED_OUTPATIENT_CLINIC_OR_DEPARTMENT_OTHER): Payer: Medicare Other | Admitting: Internal Medicine

## 2019-02-18 ENCOUNTER — Telehealth: Payer: Self-pay | Admitting: Medical Oncology

## 2019-02-18 ENCOUNTER — Inpatient Hospital Stay: Payer: Medicare Other

## 2019-02-18 ENCOUNTER — Other Ambulatory Visit: Payer: Medicare Other

## 2019-02-18 VITALS — BP 93/67 | HR 100 | Temp 97.5°F | Ht 66.0 in | Wt 145.3 lb

## 2019-02-18 DIAGNOSIS — G893 Neoplasm related pain (acute) (chronic): Secondary | ICD-10-CM | POA: Diagnosis not present

## 2019-02-18 DIAGNOSIS — M8580 Other specified disorders of bone density and structure, unspecified site: Secondary | ICD-10-CM

## 2019-02-18 DIAGNOSIS — Z5112 Encounter for antineoplastic immunotherapy: Secondary | ICD-10-CM

## 2019-02-18 DIAGNOSIS — C3492 Malignant neoplasm of unspecified part of left bronchus or lung: Secondary | ICD-10-CM

## 2019-02-18 DIAGNOSIS — C7B8 Other secondary neuroendocrine tumors: Secondary | ICD-10-CM

## 2019-02-18 DIAGNOSIS — N189 Chronic kidney disease, unspecified: Secondary | ICD-10-CM

## 2019-02-18 DIAGNOSIS — R399 Unspecified symptoms and signs involving the genitourinary system: Secondary | ICD-10-CM

## 2019-02-18 DIAGNOSIS — Z5111 Encounter for antineoplastic chemotherapy: Secondary | ICD-10-CM

## 2019-02-18 DIAGNOSIS — Z79899 Other long term (current) drug therapy: Secondary | ICD-10-CM

## 2019-02-18 DIAGNOSIS — T85818D Embolism due to other internal prosthetic devices, implants and grafts, subsequent encounter: Secondary | ICD-10-CM

## 2019-02-18 DIAGNOSIS — C7951 Secondary malignant neoplasm of bone: Secondary | ICD-10-CM

## 2019-02-18 DIAGNOSIS — K209 Esophagitis, unspecified: Secondary | ICD-10-CM | POA: Diagnosis not present

## 2019-02-18 DIAGNOSIS — C7A1 Malignant poorly differentiated neuroendocrine tumors: Secondary | ICD-10-CM | POA: Diagnosis not present

## 2019-02-18 LAB — CBC WITH DIFFERENTIAL (CANCER CENTER ONLY)
Abs Immature Granulocytes: 0.09 10*3/uL — ABNORMAL HIGH (ref 0.00–0.07)
Basophils Absolute: 0 10*3/uL (ref 0.0–0.1)
Basophils Relative: 0 %
Eosinophils Absolute: 0.1 10*3/uL (ref 0.0–0.5)
Eosinophils Relative: 2 %
HCT: 30.9 % — ABNORMAL LOW (ref 36.0–46.0)
Hemoglobin: 9.9 g/dL — ABNORMAL LOW (ref 12.0–15.0)
Immature Granulocytes: 2 %
Lymphocytes Relative: 10 %
Lymphs Abs: 0.6 10*3/uL — ABNORMAL LOW (ref 0.7–4.0)
MCH: 33.4 pg (ref 26.0–34.0)
MCHC: 32 g/dL (ref 30.0–36.0)
MCV: 104.4 fL — ABNORMAL HIGH (ref 80.0–100.0)
Monocytes Absolute: 0.9 10*3/uL (ref 0.1–1.0)
Monocytes Relative: 16 %
Neutro Abs: 3.7 10*3/uL (ref 1.7–7.7)
Neutrophils Relative %: 70 %
Platelet Count: 242 10*3/uL (ref 150–400)
RBC: 2.96 MIL/uL — ABNORMAL LOW (ref 3.87–5.11)
RDW: 19.1 % — ABNORMAL HIGH (ref 11.5–15.5)
WBC Count: 5.3 10*3/uL (ref 4.0–10.5)
nRBC: 0 % (ref 0.0–0.2)

## 2019-02-18 LAB — CMP (CANCER CENTER ONLY)
ALT: 16 U/L (ref 0–44)
AST: 21 U/L (ref 15–41)
Albumin: 3.2 g/dL — ABNORMAL LOW (ref 3.5–5.0)
Alkaline Phosphatase: 96 U/L (ref 38–126)
Anion gap: 11 (ref 5–15)
BUN: 12 mg/dL (ref 8–23)
CO2: 26 mmol/L (ref 22–32)
Calcium: 8.5 mg/dL — ABNORMAL LOW (ref 8.9–10.3)
Chloride: 104 mmol/L (ref 98–111)
Creatinine: 0.84 mg/dL (ref 0.44–1.00)
GFR, Est AFR Am: >60 mL/min (ref >60)
GFR, Estimated: >60 mL/min (ref >60)
Glucose, Bld: 130 mg/dL — ABNORMAL HIGH (ref 70–99)
Potassium: 3.3 mmol/L — ABNORMAL LOW (ref 3.5–5.1)
Sodium: 141 mmol/L (ref 135–145)
Total Bilirubin: 0.4 mg/dL (ref 0.3–1.2)
Total Protein: 6.8 g/dL (ref 6.5–8.1)

## 2019-02-18 LAB — PROTIME-INR
INR: 4.4 (ref 0.8–1.2)
Prothrombin Time: 41.6 seconds — ABNORMAL HIGH (ref 11.4–15.2)

## 2019-02-18 MED ORDER — HEPARIN SOD (PORK) LOCK FLUSH 100 UNIT/ML IV SOLN
500.0000 [IU] | Freq: Once | INTRAVENOUS | Status: AC | PRN
  Administered 2019-02-18: 500 [IU]
  Filled 2019-02-18: qty 5

## 2019-02-18 MED ORDER — SODIUM CHLORIDE 0.9% FLUSH
10.0000 mL | INTRAVENOUS | Status: DC | PRN
  Administered 2019-02-18: 13:00:00 10 mL
  Filled 2019-02-18: qty 10

## 2019-02-18 MED ORDER — CYANOCOBALAMIN 1000 MCG/ML IJ SOLN
INTRAMUSCULAR | Status: AC
  Filled 2019-02-18: qty 1

## 2019-02-18 MED ORDER — SODIUM CHLORIDE 0.9 % IV SOLN
Freq: Once | INTRAVENOUS | Status: AC
  Administered 2019-02-18: 11:00:00 via INTRAVENOUS
  Filled 2019-02-18: qty 250

## 2019-02-18 MED ORDER — PALONOSETRON HCL INJECTION 0.25 MG/5ML
INTRAVENOUS | Status: AC
  Filled 2019-02-18: qty 5

## 2019-02-18 MED ORDER — CYANOCOBALAMIN 1000 MCG/ML IJ SOLN
1000.0000 ug | Freq: Once | INTRAMUSCULAR | Status: AC
  Administered 2019-02-18: 11:00:00 1000 ug via INTRAMUSCULAR

## 2019-02-18 MED ORDER — PALONOSETRON HCL INJECTION 0.25 MG/5ML
0.2500 mg | Freq: Once | INTRAVENOUS | Status: AC
  Administered 2019-02-18: 0.25 mg via INTRAVENOUS

## 2019-02-18 MED ORDER — SODIUM CHLORIDE 0.9 % IV SOLN
Freq: Once | INTRAVENOUS | Status: AC
  Administered 2019-02-18: 11:00:00 via INTRAVENOUS
  Filled 2019-02-18: qty 5

## 2019-02-18 MED ORDER — SODIUM CHLORIDE 0.9 % IV SOLN
403.0000 mg | Freq: Once | INTRAVENOUS | Status: AC
  Administered 2019-02-18: 400 mg via INTRAVENOUS
  Filled 2019-02-18: qty 40

## 2019-02-18 MED ORDER — SODIUM CHLORIDE 0.9 % IV SOLN
200.0000 mg | Freq: Once | INTRAVENOUS | Status: AC
  Administered 2019-02-18: 200 mg via INTRAVENOUS
  Filled 2019-02-18: qty 8

## 2019-02-18 MED ORDER — SODIUM CHLORIDE 0.9 % IV SOLN
515.0000 mg/m2 | Freq: Once | INTRAVENOUS | Status: AC
  Administered 2019-02-18: 13:00:00 900 mg via INTRAVENOUS
  Filled 2019-02-18: qty 20

## 2019-02-18 NOTE — Progress Notes (Signed)
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  She presented with left upper lobe lung nodule in addition to mediastinal lymphadenopathy as well as right upper lobe as well as metastatic bone disease to the sixth and 10th rib.  The patient has significant pain from her metastatic rib lesions.  PRIOR THERAPY:  1) systemic chemotherapy with carboplatin for AUC of 5 on day 1, etoposide 100 mg/M2 on days 1, 2 and 3 as well as Tecentriq 1200 mg IV every 3 weeks with Neulasta support.  Status post 2 cycles, discontinued secondary to disease progression. 2) palliative radiotherapy to progressive T10 vertebral body bone lesion under the care of Dr. Isidore Moos .  CURRENT THERAPY: Systemic chemotherapy with carboplatin for AUC of 5, Alimta 500 mg/M2 and Keytruda 200 mg IV every 3 weeks.  First dose December 17, 2018.  Status post 3 cycles.  INTERVAL HISTORY: Alison Sullivan 68 y.o. female returns to the clinic today for follow-up visit.  The patient is feeling fine today with no concerning complaints except for fatigue.  She denied having any current chest pain, shortness of breath, cough or hemoptysis.  She denied having any fever or chills.  She has no nausea, vomiting, diarrhea or constipation.  Her pain has improved significantly after the palliative radiotherapy.  She was also found on recent lab work to have urinary tract infection and suspicious pyelonephritis.  She was started on treatment with Macrodantin.  She is tolerating her treatment well.  She is followed by urology Dr. Junious Silk. She has no headache or visual changes.  She had repeat CT scan of the chest, abdomen and pelvis performed recently and she is here for evaluation and discussion of  her scan results.  MEDICAL HISTORY: Past Medical History:  Diagnosis Date   Arthritis    Chronic kidney disease    Dog bite of wrist    History of kidney stones    History of radiation therapy 10/23/18- 11/06/18   Left lower rib mass/ 30 Gy delivered in 10 fractions 3 Gy.    Kidney infection    Left renal mass    Osteopenia    Pyelonephritis    Recurrent UTI (urinary tract infection)     ALLERGIES:  has No Known Allergies.  MEDICATIONS:  Current Outpatient Medications  Medication Sig Dispense Refill   folic acid (FOLVITE) 1 MG tablet Take 1 tablet (1 mg total) by mouth daily. 30 tablet 4   gabapentin (NEURONTIN) 300 MG capsule Take 1 capsule (300 mg total) by mouth 3 (three) times daily. 90 capsule 0   lidocaine (XYLOCAINE) 2 % solution Use as directed 5 mLs in the mouth or throat every 3 (three) hours as needed for mouth pain. May mix with Mylanta before taking if desired. 200 mL 1   lidocaine-prilocaine (EMLA) cream Apply 1 application topically as needed. 30 g 0   Melatonin 10 MG TABS Take by mouth at bedtime.     nitrofurantoin, macrocrystal-monohydrate, (MACROBID) 100 MG capsule Take 1 capsule (100 mg total) by mouth 2 (two) times daily. 10 capsule 0   omeprazole (PRILOSEC) 40 MG capsule Take 1 capsule (40 mg total) by mouth daily. 30 capsule 2   oxyCODONE (OXY IR/ROXICODONE) 5 MG immediate release tablet Take  1-2 tablets (5-10 mg total) by mouth every 4 (four) hours as needed for severe pain. 120 tablet 0   prochlorperazine (COMPAZINE) 10 MG tablet Take 1 tablet (10 mg total) by mouth every 6 (six) hours as needed for nausea or vomiting. 30 tablet 0   simvastatin (ZOCOR) 20 MG tablet Take 1 tablet (20 mg total) by mouth daily at 6 PM. Need annual appointment for further refills 90 tablet 0   sucralfate (CARAFATE) 1 g tablet DISSOLVE 1 TABLET IN 1 OUNCE OF WATER AND DRINK FOUR TIMES DAILY. TAKE 2 HOURS BEFORE OR 1 HOUR AFTER TAKING OTHER MEDICATIONS 360  tablet 0   warfarin (COUMADIN) 5 MG tablet TAKE 1 TABLET(5 MG) BY MOUTH DAILY 90 tablet 1   No current facility-administered medications for this visit.     SURGICAL HISTORY:  Past Surgical History:  Procedure Laterality Date   CYSTOSCOPY WITH RETROGRADE PYELOGRAM, URETEROSCOPY AND STENT PLACEMENT Left 12/28/2017   Procedure: CYSTOSCOPY WITH RETROGRADE PYELOGRAM, URETEROSCOPY AND STENT PLACEMENT, STONE BASKETRY;  Surgeon: Festus Aloe, MD;  Location: [facility];  Service: Urology;  Laterality: Left;   FINGER SURGERY     index, growth removal   HOLMIUM LASER APPLICATION Left 5/57/3220   Procedure: HOLMIUM LASER APPLICATION;  Surgeon: Festus Aloe, MD;  Location: [facility];  Service: Urology;  Laterality: Left;   IR IMAGING GUIDED PORT INSERTION  10/31/2018   KIDNEY STONE SURGERY  2011 or 2012   LITHOTRIPSY  2007    REVIEW OF SYSTEMS:  Constitutional: positive for fatigue Eyes: negative Ears, nose, mouth, throat, and face: negative Respiratory: positive for dyspnea on exertion Cardiovascular: negative Gastrointestinal: negative Genitourinary:negative Integument/breast: negative Hematologic/lymphatic: negative Musculoskeletal:negative Neurological: negative Behavioral/Psych: negative Endocrine: negative Allergic/Immunologic: negative   PHYSICAL EXAMINATION: General appearance: alert, cooperative, fatigued and no distress Head: Normocephalic, without obvious abnormality, atraumatic Neck: no adenopathy, no JVD, supple, symmetrical, trachea midline and thyroid not enlarged, symmetric, no tenderness/mass/nodules Lymph nodes: Cervical, supraclavicular, and axillary nodes normal. Resp: clear to auscultation bilaterally Back: symmetric, no curvature. ROM normal. No CVA tenderness. Cardio: regular rate and rhythm, S1, S2 normal, no murmur, click, rub or gallop GI: soft, non-tender; bowel sounds normal; no masses,  no  organomegaly Extremities: extremities normal, atraumatic, no cyanosis or edema Neurologic: Alert and oriented X 3, normal strength and tone. Normal symmetric reflexes. Normal coordination and gait  ECOG PERFORMANCE STATUS: 1 - Symptomatic but completely ambulatory  Blood pressure 93/67, pulse 100, temperature (!) 97.5 F (36.4 C), temperature source Oral, height 5\' 6"  (1.676 m), weight 145 lb 4.8 oz (65.9 kg), SpO2 97 %.  LABORATORY DATA: Lab Results  Component Value Date   WBC 1.5 (L) 02/11/2019   HGB 8.8 (L) 02/11/2019   HCT 27.6 (L) 02/11/2019   MCV 101.8 (H) 02/11/2019   PLT 58 (L) 02/11/2019      Chemistry      Component Value Date/Time   NA 137 02/11/2019 1146   K 3.4 (L) 02/11/2019 1146   CL 103 02/11/2019 1146   CO2 26 02/11/2019 1146   BUN 13 02/11/2019 1146   CREATININE 0.78 02/11/2019 1146      Component Value Date/Time   CALCIUM 8.2 (L) 02/11/2019 1146   ALKPHOS 82 02/11/2019 1146   AST 17 02/11/2019 1146   ALT 13 02/11/2019 1146   BILITOT 0.4 02/11/2019 1146       RADIOGRAPHIC STUDIES: Ct Chest W Contrast  Result Date: 02/15/2019 CLINICAL DATA:  Stage IV left upper lobe  high-grade neuroendocrine lung carcinoma with small and large cell features diagnosed November 2019. interval chemotherapy and immunotherapy. Restaging. Palliative radiation therapy to thoracic spine and left rib lesions completed 02/08/2019. EXAM: CT CHEST, ABDOMEN, AND PELVIS WITH CONTRAST TECHNIQUE: Multidetector CT imaging of the chest, abdomen and pelvis was performed following the standard protocol during bolus administration of intravenous contrast. CONTRAST:  131mL OMNIPAQUE IOHEXOL 300 MG/ML  SOLN COMPARISON:  12/03/2018 CT chest, abdomen and pelvis. FINDINGS: CT CHEST FINDINGS Cardiovascular: Normal heart size. No significant pericardial effusion/thickening. Right internal jugular Port-A-Cath terminates in the lower third of the SVC. Atherosclerotic nonaneurysmal thoracic aorta. Normal  caliber pulmonary arteries. No central pulmonary emboli. Mediastinum/Nodes: No discrete thyroid nodules. Unremarkable esophagus. No axillary adenopathy. Bulky 3.6 x 2.9 cm left pericardiophrenic mass (series 2/image 40), increased from 2.9 x 2.3 cm. Enlarged 1.9 cm short axis left prevascular node (series 2/image 17), increased from 1.4 cm using similar measurement technique. No hilar adenopathy. Lungs/Pleura: No pneumothorax. No right pleural effusion. New trace dependent left pleural effusion. Far inferior plaque-like posterior left pleural lesion measures 0.7 cm thickness, previously 0.8 cm, not appreciably changed (series 7/image 15). Subsolid 4.1 x 2.4 cm medial superior segment right lower lobe mass (series 6/image 51), previously 4.1 x 2.4 cm using similar measurement technique, stable. Thick irregular bandlike focus of consolidation with surrounding patchy ground-glass opacity in the right upper lobe is unchanged. Solid 2.1 x 1.7 cm left upper lobe pulmonary nodule (series 6/image 53), previously 2.4 x 1.7 cm, slightly decreased. No new significant pulmonary nodules. No acute consolidative airspace disease. Musculoskeletal: Left posterior tenth rib expansile lesion measures 2.2 cm thickness, mildly increased from 1.8 cm, with associated healed pathologic fracture. Increased sclerosis within the sclerotic ill-defined T10 vertebral body lesion extending into the right pedicle. No new focal osseous lesions in the chest. Minimal thoracic spondylosis. CT ABDOMEN PELVIS FINDINGS Hepatobiliary: Normal liver size. Several tiny subcentimeter hypodense lesions scattered throughout the liver, too small to characterize, unchanged. No new liver lesions. Normal gallbladder with no radiopaque cholelithiasis. No biliary ductal dilatation. Pancreas: Normal, with no mass or duct dilation. Spleen: Normal size. No mass. Adrenals/Urinary Tract: Normal adrenals. New 3 mm stone at the right ureterovesical junction. Minimal right  hydroureteronephrosis, minimally increased from prior. Increased urothelial wall thickening and enhancement throughout the right renal collecting system and right ureter. Several nonobstructing stones in the kidneys bilaterally, largest 8 mm in the lower right kidney and 5 mm in the lower left kidney. No overt left hydronephrosis. Hypodense 2.4 cm lateral interpolar right renal cortical lesion is stable in size. Numerous subcentimeter hypodense renal cortical lesions throughout both kidneys are too small to characterize and are not appreciably changed. Nondistended bladder with suggestion of new mild bladder wall thickening and mucosal hyperenhancement. Stomach/Bowel: Normal non-distended stomach. Normal caliber small bowel with no small bowel wall thickening. Appendix not discretely visualized. Marked sigmoid diverticulosis. Similar chronic mild wall thickening in the sigmoid colon without acute pericolonic fat stranding. Vascular/Lymphatic: Atherosclerotic nonaneurysmal abdominal aorta. Patent portal, splenic, hepatic and renal veins. No pathologically enlarged lymph nodes in the abdomen or pelvis. Reproductive: Grossly normal uterus. Stable simple benign appearing 1.2 cm right adnexal cyst. No left adnexal mass. Other: No pneumoperitoneum, ascites or focal fluid collection. Musculoskeletal: No aggressive appearing focal osseous lesions. Moderate lower lumbar spondylosis. IMPRESSION: 1. New 3 mm stone at the right ureterovesical junction, with minimal right hydroureteronephrosis, slightly increased from prior. 2. New mild diffuse bladder wall thickening and mucosal hyperenhancement. New urothelial wall thickening and  hyperenhancement throughout the right renal collecting system and right ureter. Findings suggest acute cystitis with ascending right urinary tract infection. Suggest correlation with urinalysis. 3. Bulky left pericardiophrenic and left prevascular mediastinal nodal metastases have increased. 4. New  trace dependent left pleural effusion. 5. Left upper lobe solid pulmonary nodule is slightly decreased. Superior segment right lower lobe subsolid lung mass is stable. 6. Increased sclerosis within the T10 spinal metastasis, nonspecific, potentially due to treatment change. Left posterior tenth rib expansile metastasis is slightly increased in thickness. Far inferior plaque-like pleural lesion adjacent to the left twelfth rib is stable. No new osseous lesions. 7. No evidence of metastatic disease in the abdomen or pelvis. 8. Marked sigmoid diverticulosis with chronic mild sigmoid wall thickening, favoring chronic diverticulosis. 9.  Aortic Atherosclerosis (ICD10-I70.0). These results will be called to the ordering clinician or representative by the Radiologist Assistant, and communication documented in the PACS or zVision Dashboard. Electronically Signed   By: Ilona Sorrel M.D.   On: 02/15/2019 16:13   Ct Abdomen Pelvis W Contrast  Result Date: 02/15/2019 CLINICAL DATA:  Stage IV left upper lobe high-grade neuroendocrine lung carcinoma with small and large cell features diagnosed November 2019. interval chemotherapy and immunotherapy. Restaging. Palliative radiation therapy to thoracic spine and left rib lesions completed 02/08/2019. EXAM: CT CHEST, ABDOMEN, AND PELVIS WITH CONTRAST TECHNIQUE: Multidetector CT imaging of the chest, abdomen and pelvis was performed following the standard protocol during bolus administration of intravenous contrast. CONTRAST:  172mL OMNIPAQUE IOHEXOL 300 MG/ML  SOLN COMPARISON:  12/03/2018 CT chest, abdomen and pelvis. FINDINGS: CT CHEST FINDINGS Cardiovascular: Normal heart size. No significant pericardial effusion/thickening. Right internal jugular Port-A-Cath terminates in the lower third of the SVC. Atherosclerotic nonaneurysmal thoracic aorta. Normal caliber pulmonary arteries. No central pulmonary emboli. Mediastinum/Nodes: No discrete thyroid nodules. Unremarkable esophagus.  No axillary adenopathy. Bulky 3.6 x 2.9 cm left pericardiophrenic mass (series 2/image 40), increased from 2.9 x 2.3 cm. Enlarged 1.9 cm short axis left prevascular node (series 2/image 17), increased from 1.4 cm using similar measurement technique. No hilar adenopathy. Lungs/Pleura: No pneumothorax. No right pleural effusion. New trace dependent left pleural effusion. Far inferior plaque-like posterior left pleural lesion measures 0.7 cm thickness, previously 0.8 cm, not appreciably changed (series 7/image 15). Subsolid 4.1 x 2.4 cm medial superior segment right lower lobe mass (series 6/image 51), previously 4.1 x 2.4 cm using similar measurement technique, stable. Thick irregular bandlike focus of consolidation with surrounding patchy ground-glass opacity in the right upper lobe is unchanged. Solid 2.1 x 1.7 cm left upper lobe pulmonary nodule (series 6/image 53), previously 2.4 x 1.7 cm, slightly decreased. No new significant pulmonary nodules. No acute consolidative airspace disease. Musculoskeletal: Left posterior tenth rib expansile lesion measures 2.2 cm thickness, mildly increased from 1.8 cm, with associated healed pathologic fracture. Increased sclerosis within the sclerotic ill-defined T10 vertebral body lesion extending into the right pedicle. No new focal osseous lesions in the chest. Minimal thoracic spondylosis. CT ABDOMEN PELVIS FINDINGS Hepatobiliary: Normal liver size. Several tiny subcentimeter hypodense lesions scattered throughout the liver, too small to characterize, unchanged. No new liver lesions. Normal gallbladder with no radiopaque cholelithiasis. No biliary ductal dilatation. Pancreas: Normal, with no mass or duct dilation. Spleen: Normal size. No mass. Adrenals/Urinary Tract: Normal adrenals. New 3 mm stone at the right ureterovesical junction. Minimal right hydroureteronephrosis, minimally increased from prior. Increased urothelial wall thickening and enhancement throughout the right  renal collecting system and right ureter. Several nonobstructing stones in the kidneys bilaterally,  largest 8 mm in the lower right kidney and 5 mm in the lower left kidney. No overt left hydronephrosis. Hypodense 2.4 cm lateral interpolar right renal cortical lesion is stable in size. Numerous subcentimeter hypodense renal cortical lesions throughout both kidneys are too small to characterize and are not appreciably changed. Nondistended bladder with suggestion of new mild bladder wall thickening and mucosal hyperenhancement. Stomach/Bowel: Normal non-distended stomach. Normal caliber small bowel with no small bowel wall thickening. Appendix not discretely visualized. Marked sigmoid diverticulosis. Similar chronic mild wall thickening in the sigmoid colon without acute pericolonic fat stranding. Vascular/Lymphatic: Atherosclerotic nonaneurysmal abdominal aorta. Patent portal, splenic, hepatic and renal veins. No pathologically enlarged lymph nodes in the abdomen or pelvis. Reproductive: Grossly normal uterus. Stable simple benign appearing 1.2 cm right adnexal cyst. No left adnexal mass. Other: No pneumoperitoneum, ascites or focal fluid collection. Musculoskeletal: No aggressive appearing focal osseous lesions. Moderate lower lumbar spondylosis. IMPRESSION: 1. New 3 mm stone at the right ureterovesical junction, with minimal right hydroureteronephrosis, slightly increased from prior. 2. New mild diffuse bladder wall thickening and mucosal hyperenhancement. New urothelial wall thickening and hyperenhancement throughout the right renal collecting system and right ureter. Findings suggest acute cystitis with ascending right urinary tract infection. Suggest correlation with urinalysis. 3. Bulky left pericardiophrenic and left prevascular mediastinal nodal metastases have increased. 4. New trace dependent left pleural effusion. 5. Left upper lobe solid pulmonary nodule is slightly decreased. Superior segment right  lower lobe subsolid lung mass is stable. 6. Increased sclerosis within the T10 spinal metastasis, nonspecific, potentially due to treatment change. Left posterior tenth rib expansile metastasis is slightly increased in thickness. Far inferior plaque-like pleural lesion adjacent to the left twelfth rib is stable. No new osseous lesions. 7. No evidence of metastatic disease in the abdomen or pelvis. 8. Marked sigmoid diverticulosis with chronic mild sigmoid wall thickening, favoring chronic diverticulosis. 9.  Aortic Atherosclerosis (ICD10-I70.0). These results will be called to the ordering clinician or representative by the Radiologist Assistant, and communication documented in the PACS or zVision Dashboard. Electronically Signed   By: Ilona Sorrel M.D.   On: 02/15/2019 16:13    ASSESSMENT AND PLAN: This is a very pleasant 68 years old white female recently diagnosed with high-grade neuroendocrine carcinoma. The patient underwent systemic chemotherapy with small cell regimen including carboplatin, etoposide and Tecentriq status post 2 cycles. She tolerated her treatment well but unfortunately repeat CT scan of the chest, abdomen and pelvis showed evidence for disease progression. The patient was started on systemic chemotherapy with carboplatin, Alimta and Keytruda status post 3 cycles.  The patient has been tolerating this treatment well with no concerning adverse effects except for fatigue. She had repeat CT scan of the chest, abdomen and pelvis performed recently.  I personally and independently reviewed the scans and discussed the result and showed the images to the patient today. PET scan showed a stable disease except for mild increase and mediastinal lymphadenopathy as well as the bulky left pericardiophrenic lymph node.  This could be pseudo-progression from immunotherapy. I recommended for the patient to continue her current treatment with carboplatin, Alimta and Keytruda for cycle #4. In the  meantime I will refer the patient to Dr. Durenda Hurt at [facility] for second opinion regarding her condition and other treatment options for her aggressive high-grade neuroendocrine carcinoma. For the urinary tract infection and pyelonephritis, we started the patient on Macrodantin and we referred her to Dr. Junious Silk her urologist for further evaluation. For the persistent  back pain and T10 bone lesion with epidural extension, she underwent palliative radiotherapy to this area under the care of Dr. Isidore Moos.  She will also continue her current pain medication with oxycodone and gabapentin. For the suspicious nonocclusive blood clot at the tip of the Port-A-Cath, she was started on Coumadin 5 mg p.o. daily.  We will continue to monitor her PT/INR closely. The patient will come back for follow-up visit in 3 weeks for evaluation before the next cycle of her treatment. The patient voices understanding of current disease status and treatment options and is in agreement with the current care plan. All questions were answered. The patient knows to call the clinic with any problems, questions or concerns. We can certainly see the patient much sooner if necessary.  Disclaimer: This note was dictated with voice recognition software. Similar sounding words can inadvertently be transcribed and may not be corrected upon review.

## 2019-02-18 NOTE — Progress Notes (Unsigned)
Critical INR 4.4 called to Diane Bell,RN at 1052 am 02/18/2019 by Dorian Furnace ([place])

## 2019-02-18 NOTE — Patient Instructions (Addendum)
New Coumadin dosing- **Take coumadin 5 mg every day except on Monday and Thursday take 2.5 mg**  Coronavirus (COVID-19) Are you at risk?  Are you at risk for the Coronavirus (COVID-19)?  To be considered HIGH RISK for Coronavirus (COVID-19), you have to meet the following criteria:  . Traveled to [place], [place], [place], [place] or [place]; or in the [place] to [place], [place], [place], or [place]; and have fever, cough, and shortness of breath within the last 2 weeks of travel OR . Been in close contact with a person diagnosed with COVID-19 within the last 2 weeks and have fever, cough, and shortness of breath . IF YOU DO NOT MEET THESE CRITERIA, YOU ARE CONSIDERED LOW RISK FOR COVID-19.  What to do if you are HIGH RISK for COVID-19?  Marland Kitchen If you are having a medical emergency, call 911. . Seek medical care right away. Before you go to a doctor's office, urgent care or emergency department, call ahead and tell them about your recent travel, contact with someone diagnosed with COVID-19, and your symptoms. You should receive instructions from your physician's office regarding next steps of care.  . When you arrive at healthcare provider, tell the healthcare staff immediately you have returned from visiting [place], [place], [place], [place] or [place]; or traveled in the [place] to [place], [place], [place], or [place]; in the last two weeks or you have been in close contact with a person diagnosed with COVID-19 in the last 2 weeks.   . Tell the health care staff about your symptoms: fever, cough and shortness of breath. . After you have been seen by a medical provider, you will be either: o Tested for (COVID-19) and discharged home on quarantine except to seek medical care if symptoms worsen, and asked to  - Stay home and avoid contact with others until you get your results (4-5 days)  - Avoid travel on public transportation if possible (such as bus, train, or airplane)  or o Sent to the Emergency Department by EMS for evaluation, COVID-19 testing, and possible admission depending on your condition and test results.  What to do if you are LOW RISK for COVID-19?  Reduce your risk of any infection by using the same precautions used for avoiding the common cold or flu:  Marland Kitchen Wash your hands often with soap and warm water for at least 20 seconds.  If soap and water are not readily available, use an alcohol-based hand sanitizer with at least 60% alcohol.  . If coughing or sneezing, cover your mouth and nose by coughing or sneezing into the elbow areas of your shirt or coat, into a tissue or into your sleeve (not your hands). . Avoid shaking hands with others and consider head nods or verbal greetings only. . Avoid touching your eyes, nose, or mouth with unwashed hands.  . Avoid close contact with people who are sick. . Avoid places or events with large numbers of people in one location, like concerts or sporting events. . Carefully consider travel plans you have or are making. . If you are planning any travel outside or inside the [place], visit the CDC's Travelers' Health webpage for the latest health notices. . If you have some symptoms but not all symptoms, continue to monitor at home and seek medical attention if your symptoms worsen. . If you are having a medical emergency, call 911.   [place] /  e-Visit: [email or web address]         [facility]: [facility]: [phone]                   [facility]: [place] Discharge Instructions for Patients Receiving Chemotherapy  Today you received the following chemotherapy agents: Pembrolizumab Beryle Flock), Pemetrexed (Alimta), and Carboplatin (Paraplatin)  To help prevent nausea and vomiting after your treatment, we encourage you to  take your nausea medication as directed.    If you develop nausea and vomiting that is not controlled by your nausea medication, call the clinic.   BELOW ARE SYMPTOMS THAT SHOULD BE REPORTED IMMEDIATELY:  *FEVER GREATER THAN 100.5 F  *CHILLS WITH OR WITHOUT FEVER  NAUSEA AND VOMITING THAT IS NOT CONTROLLED WITH YOUR NAUSEA MEDICATION  *UNUSUAL SHORTNESS OF BREATH  *UNUSUAL BRUISING OR BLEEDING  TENDERNESS IN MOUTH AND THROAT WITH OR WITHOUT PRESENCE OF ULCERS  *URINARY PROBLEMS  *BOWEL PROBLEMS  UNUSUAL RASH Items with * indicate a potential emergency and should be followed up as soon as possible.  Feel free to call the clinic should you have any questions or concerns. The clinic phone number is [phone].  Please show the [place] at check-in to the Emergency Department and triage nurse.

## 2019-02-18 NOTE — Telephone Encounter (Signed)
Coumadin dose change. Pt notified.

## 2019-02-22 ENCOUNTER — Inpatient Hospital Stay (HOSPITAL_BASED_OUTPATIENT_CLINIC_OR_DEPARTMENT_OTHER): Payer: Medicare Other | Admitting: Medical

## 2019-02-22 ENCOUNTER — Other Ambulatory Visit: Payer: Self-pay | Admitting: Medical

## 2019-02-22 ENCOUNTER — Other Ambulatory Visit: Payer: Self-pay

## 2019-02-22 ENCOUNTER — Inpatient Hospital Stay: Payer: Medicare Other

## 2019-02-22 ENCOUNTER — Telehealth: Payer: Self-pay | Admitting: *Deleted

## 2019-02-22 VITALS — BP 106/67 | HR 105 | Temp 97.9°F | Resp 18 | Ht 66.0 in | Wt 145.0 lb

## 2019-02-22 DIAGNOSIS — G893 Neoplasm related pain (acute) (chronic): Secondary | ICD-10-CM | POA: Diagnosis not present

## 2019-02-22 DIAGNOSIS — K5780 Diverticulitis of intestine, part unspecified, with perforation and abscess without bleeding: Secondary | ICD-10-CM | POA: Diagnosis not present

## 2019-02-22 DIAGNOSIS — C7B8 Other secondary neuroendocrine tumors: Secondary | ICD-10-CM

## 2019-02-22 DIAGNOSIS — C3492 Malignant neoplasm of unspecified part of left bronchus or lung: Secondary | ICD-10-CM

## 2019-02-22 DIAGNOSIS — C7A1 Malignant poorly differentiated neuroendocrine tumors: Secondary | ICD-10-CM

## 2019-02-22 DIAGNOSIS — Z87891 Personal history of nicotine dependence: Secondary | ICD-10-CM

## 2019-02-22 DIAGNOSIS — C7951 Secondary malignant neoplasm of bone: Secondary | ICD-10-CM | POA: Diagnosis not present

## 2019-02-22 DIAGNOSIS — D630 Anemia in neoplastic disease: Secondary | ICD-10-CM | POA: Diagnosis not present

## 2019-02-22 DIAGNOSIS — Z808 Family history of malignant neoplasm of other organs or systems: Secondary | ICD-10-CM | POA: Diagnosis not present

## 2019-02-22 DIAGNOSIS — R630 Anorexia: Secondary | ICD-10-CM

## 2019-02-22 DIAGNOSIS — R110 Nausea: Secondary | ICD-10-CM

## 2019-02-22 DIAGNOSIS — R197 Diarrhea, unspecified: Secondary | ICD-10-CM | POA: Diagnosis not present

## 2019-02-22 DIAGNOSIS — R1032 Left lower quadrant pain: Secondary | ICD-10-CM | POA: Diagnosis not present

## 2019-02-22 DIAGNOSIS — K5720 Diverticulitis of large intestine with perforation and abscess without bleeding: Secondary | ICD-10-CM | POA: Diagnosis not present

## 2019-02-22 DIAGNOSIS — R112 Nausea with vomiting, unspecified: Secondary | ICD-10-CM | POA: Diagnosis not present

## 2019-02-22 DIAGNOSIS — R5382 Chronic fatigue, unspecified: Secondary | ICD-10-CM

## 2019-02-22 DIAGNOSIS — R1110 Vomiting, unspecified: Secondary | ICD-10-CM | POA: Diagnosis not present

## 2019-02-22 DIAGNOSIS — Z95828 Presence of other vascular implants and grafts: Secondary | ICD-10-CM

## 2019-02-22 DIAGNOSIS — E871 Hypo-osmolality and hyponatremia: Secondary | ICD-10-CM | POA: Diagnosis not present

## 2019-02-22 LAB — CMP (CANCER CENTER ONLY)
ALT: 14 U/L (ref 0–44)
AST: 21 U/L (ref 15–41)
Albumin: 3.4 g/dL — ABNORMAL LOW (ref 3.5–5.0)
Alkaline Phosphatase: 106 U/L (ref 38–126)
Anion gap: 16 — ABNORMAL HIGH (ref 5–15)
BUN: 20 mg/dL (ref 8–23)
CO2: 20 mmol/L — ABNORMAL LOW (ref 22–32)
Calcium: 9 mg/dL (ref 8.9–10.3)
Chloride: 103 mmol/L (ref 98–111)
Creatinine: 0.79 mg/dL (ref 0.44–1.00)
GFR, Est AFR Am: >60 mL/min (ref >60)
GFR, Estimated: >60 mL/min (ref >60)
Glucose, Bld: 111 mg/dL — ABNORMAL HIGH (ref 70–99)
Potassium: 3.9 mmol/L (ref 3.5–5.1)
Sodium: 139 mmol/L (ref 135–145)
Total Bilirubin: 0.7 mg/dL (ref 0.3–1.2)
Total Protein: 7.6 g/dL (ref 6.5–8.1)

## 2019-02-22 LAB — CBC WITH DIFFERENTIAL (CANCER CENTER ONLY)
Abs Immature Granulocytes: 0.01 10*3/uL (ref 0.00–0.07)
Basophils Absolute: 0 10*3/uL (ref 0.0–0.1)
Basophils Relative: 0 %
Eosinophils Absolute: 0 10*3/uL (ref 0.0–0.5)
Eosinophils Relative: 0 %
HCT: 33.7 % — ABNORMAL LOW (ref 36.0–46.0)
Hemoglobin: 11.3 g/dL — ABNORMAL LOW (ref 12.0–15.0)
Immature Granulocytes: 0 %
Lymphocytes Relative: 4 %
Lymphs Abs: 0.3 10*3/uL — ABNORMAL LOW (ref 0.7–4.0)
MCH: 33.6 pg (ref 26.0–34.0)
MCHC: 33.5 g/dL (ref 30.0–36.0)
MCV: 100.3 fL — ABNORMAL HIGH (ref 80.0–100.0)
Monocytes Absolute: 0.1 10*3/uL (ref 0.1–1.0)
Monocytes Relative: 1 %
Neutro Abs: 6.1 10*3/uL (ref 1.7–7.7)
Neutrophils Relative %: 95 %
Platelet Count: 317 10*3/uL (ref 150–400)
RBC: 3.36 MIL/uL — ABNORMAL LOW (ref 3.87–5.11)
RDW: 17.7 % — ABNORMAL HIGH (ref 11.5–15.5)
WBC Count: 6.5 10*3/uL (ref 4.0–10.5)
nRBC: 0 % (ref 0.0–0.2)

## 2019-02-22 LAB — MAGNESIUM: Magnesium: 1.8 mg/dL (ref 1.7–2.4)

## 2019-02-22 MED ORDER — SODIUM CHLORIDE 0.9 % IV SOLN
Freq: Once | INTRAVENOUS | Status: AC
  Administered 2019-02-22: 16:00:00 via INTRAVENOUS
  Filled 2019-02-22: qty 250

## 2019-02-22 MED ORDER — MORPHINE SULFATE (PF) 4 MG/ML IV SOLN
1.0000 mg | Freq: Once | INTRAVENOUS | Status: AC
  Administered 2019-02-22: 1 mg via INTRAVENOUS

## 2019-02-22 MED ORDER — HEPARIN SOD (PORK) LOCK FLUSH 100 UNIT/ML IV SOLN
500.0000 [IU] | Freq: Once | INTRAVENOUS | Status: AC
  Administered 2019-02-22: 500 [IU]
  Filled 2019-02-22: qty 5

## 2019-02-22 MED ORDER — SODIUM CHLORIDE 0.9% FLUSH
10.0000 mL | Freq: Once | INTRAVENOUS | Status: AC
  Administered 2019-02-22: 10 mL
  Filled 2019-02-22: qty 10

## 2019-02-22 MED ORDER — SCOPOLAMINE 1 MG/3DAYS TD PT72: 1.0000 | MEDICATED_PATCH | TRANSDERMAL | 12 refills | Status: DC

## 2019-02-22 MED ORDER — MORPHINE SULFATE (PF) 4 MG/ML IV SOLN
INTRAVENOUS | Status: AC
  Filled 2019-02-22: qty 1

## 2019-02-22 NOTE — Patient Instructions (Signed)

## 2019-02-22 NOTE — Telephone Encounter (Signed)
Received vm call from son Shea Stakes stating pt having issues last few days & unable to hold food or pills down & she states she needs to be in the hospital.  Returned call & son is in [place] but can be at [employer] in @ 1 hour.  He states that she feels like vomiting all the time although he doesn't think she is vomiting.  He states she has a strong gag reflex & afraid to eat or take pain pills due to this.  She also report fatigue.  Discussed with Sandi Mealy PA & informed Shea Stakes to get pt to office as quickly as possible.  He is in agreement.  Labs to be done first & Lucianne Lei sent schedule message.

## 2019-02-22 NOTE — Progress Notes (Signed)
Pt received 1L IVF NS today and 1mg  morphine IVP.  Tolerated well.  Reports feeling a bit better at end of treatment.  Spoke with pt's son over the phone with patient, gave d/c instructions including scrips and nausea management.  Pt/son VU of instructions and to call back as needed/what to look out for.

## 2019-02-23 ENCOUNTER — Emergency Department (HOSPITAL_COMMUNITY): Payer: Medicare Other

## 2019-02-23 ENCOUNTER — Inpatient Hospital Stay (HOSPITAL_COMMUNITY): Payer: Medicare Other

## 2019-02-23 ENCOUNTER — Encounter (HOSPITAL_COMMUNITY): Payer: Medicare Other

## 2019-02-23 ENCOUNTER — Other Ambulatory Visit: Payer: Self-pay

## 2019-02-23 ENCOUNTER — Inpatient Hospital Stay (HOSPITAL_COMMUNITY)
Admission: EM | Admit: 2019-02-23 | Discharge: 2019-02-27 | DRG: 392 | Disposition: A | Discharge status: Home / Self Care | Payer: Medicare Other | Attending: Internal Medicine | Admitting: Internal Medicine

## 2019-02-23 DIAGNOSIS — T451X5A Adverse effect of antineoplastic and immunosuppressive drugs, initial encounter: Secondary | ICD-10-CM | POA: Diagnosis not present

## 2019-02-23 DIAGNOSIS — D630 Anemia in neoplastic disease: Secondary | ICD-10-CM | POA: Diagnosis present

## 2019-02-23 DIAGNOSIS — D709 Neutropenia, unspecified: Secondary | ICD-10-CM | POA: Diagnosis present

## 2019-02-23 DIAGNOSIS — Z91041 Radiographic dye allergy status: Secondary | ICD-10-CM

## 2019-02-23 DIAGNOSIS — E860 Dehydration: Secondary | ICD-10-CM | POA: Diagnosis present

## 2019-02-23 DIAGNOSIS — Z923 Personal history of irradiation: Secondary | ICD-10-CM

## 2019-02-23 DIAGNOSIS — K5780 Diverticulitis of intestine, part unspecified, with perforation and abscess without bleeding: Secondary | ICD-10-CM | POA: Diagnosis not present

## 2019-02-23 DIAGNOSIS — C7A8 Other malignant neuroendocrine tumors: Secondary | ICD-10-CM | POA: Diagnosis present

## 2019-02-23 DIAGNOSIS — Z7901 Long term (current) use of anticoagulants: Secondary | ICD-10-CM

## 2019-02-23 DIAGNOSIS — C7951 Secondary malignant neoplasm of bone: Secondary | ICD-10-CM | POA: Diagnosis present

## 2019-02-23 DIAGNOSIS — M1990 Unspecified osteoarthritis, unspecified site: Secondary | ICD-10-CM | POA: Diagnosis present

## 2019-02-23 DIAGNOSIS — K5732 Diverticulitis of large intestine without perforation or abscess without bleeding: Secondary | ICD-10-CM | POA: Diagnosis present

## 2019-02-23 DIAGNOSIS — R1110 Vomiting, unspecified: Secondary | ICD-10-CM | POA: Diagnosis not present

## 2019-02-23 DIAGNOSIS — R197 Diarrhea, unspecified: Secondary | ICD-10-CM | POA: Diagnosis not present

## 2019-02-23 DIAGNOSIS — E871 Hypo-osmolality and hyponatremia: Secondary | ICD-10-CM | POA: Diagnosis present

## 2019-02-23 DIAGNOSIS — I8290 Acute embolism and thrombosis of unspecified vein: Secondary | ICD-10-CM | POA: Diagnosis present

## 2019-02-23 DIAGNOSIS — R112 Nausea with vomiting, unspecified: Secondary | ICD-10-CM

## 2019-02-23 DIAGNOSIS — Z808 Family history of malignant neoplasm of other organs or systems: Secondary | ICD-10-CM

## 2019-02-23 DIAGNOSIS — N189 Chronic kidney disease, unspecified: Secondary | ICD-10-CM | POA: Diagnosis present

## 2019-02-23 DIAGNOSIS — C7A1 Malignant poorly differentiated neuroendocrine tumors: Secondary | ICD-10-CM | POA: Diagnosis present

## 2019-02-23 DIAGNOSIS — K219 Gastro-esophageal reflux disease without esophagitis: Secondary | ICD-10-CM | POA: Diagnosis present

## 2019-02-23 DIAGNOSIS — M8580 Other specified disorders of bone density and structure, unspecified site: Secondary | ICD-10-CM | POA: Diagnosis present

## 2019-02-23 DIAGNOSIS — E876 Hypokalemia: Secondary | ICD-10-CM | POA: Diagnosis present

## 2019-02-23 DIAGNOSIS — R1032 Left lower quadrant pain: Secondary | ICD-10-CM | POA: Diagnosis not present

## 2019-02-23 DIAGNOSIS — R110 Nausea: Secondary | ICD-10-CM | POA: Diagnosis not present

## 2019-02-23 DIAGNOSIS — K5720 Diverticulitis of large intestine with perforation and abscess without bleeding: Secondary | ICD-10-CM | POA: Diagnosis present

## 2019-02-23 DIAGNOSIS — D701 Agranulocytosis secondary to cancer chemotherapy: Secondary | ICD-10-CM | POA: Diagnosis not present

## 2019-02-23 DIAGNOSIS — I82409 Acute embolism and thrombosis of unspecified deep veins of unspecified lower extremity: Secondary | ICD-10-CM

## 2019-02-23 DIAGNOSIS — Z87891 Personal history of nicotine dependence: Secondary | ICD-10-CM | POA: Diagnosis not present

## 2019-02-23 DIAGNOSIS — R1111 Vomiting without nausea: Secondary | ICD-10-CM | POA: Diagnosis not present

## 2019-02-23 LAB — CBC WITH DIFFERENTIAL/PLATELET
Abs Immature Granulocytes: 0.02 10*3/uL (ref 0.00–0.07)
Basophils Absolute: 0 10*3/uL (ref 0.0–0.1)
Basophils Relative: 0 %
Eosinophils Absolute: 0 10*3/uL (ref 0.0–0.5)
Eosinophils Relative: 0 %
HCT: 28.8 % — ABNORMAL LOW (ref 36.0–46.0)
Hemoglobin: 9.6 g/dL — ABNORMAL LOW (ref 12.0–15.0)
Immature Granulocytes: 1 %
Lymphocytes Relative: 4 %
Lymphs Abs: 0.2 10*3/uL — ABNORMAL LOW (ref 0.7–4.0)
MCH: 33.9 pg (ref 26.0–34.0)
MCHC: 33.3 g/dL (ref 30.0–36.0)
MCV: 101.8 fL — ABNORMAL HIGH (ref 80.0–100.0)
Monocytes Absolute: 0.1 10*3/uL (ref 0.1–1.0)
Monocytes Relative: 3 %
Neutro Abs: 3.9 10*3/uL (ref 1.7–7.7)
Neutrophils Relative %: 92 %
Platelets: 235 10*3/uL (ref 150–400)
RBC: 2.83 MIL/uL — ABNORMAL LOW (ref 3.87–5.11)
RDW: 17.5 % — ABNORMAL HIGH (ref 11.5–15.5)
WBC: 4.2 10*3/uL (ref 4.0–10.5)
nRBC: 0 % (ref 0.0–0.2)

## 2019-02-23 LAB — COMPREHENSIVE METABOLIC PANEL
ALT: 16 U/L (ref 0–44)
AST: 23 U/L (ref 15–41)
Albumin: 3.2 g/dL — ABNORMAL LOW (ref 3.5–5.0)
Alkaline Phosphatase: 80 U/L (ref 38–126)
Anion gap: 9 (ref 5–15)
BUN: 19 mg/dL (ref 8–23)
CO2: 21 mmol/L — ABNORMAL LOW (ref 22–32)
Calcium: 8.1 mg/dL — ABNORMAL LOW (ref 8.9–10.3)
Chloride: 104 mmol/L (ref 98–111)
Creatinine, Ser: 0.61 mg/dL (ref 0.44–1.00)
GFR calc Af Amer: >60 mL/min (ref >60)
GFR calc non Af Amer: >60 mL/min (ref >60)
Glucose, Bld: 116 mg/dL — ABNORMAL HIGH (ref 70–99)
Potassium: 3.1 mmol/L — ABNORMAL LOW (ref 3.5–5.1)
Sodium: 134 mmol/L — ABNORMAL LOW (ref 135–145)
Total Bilirubin: 0.9 mg/dL (ref 0.3–1.2)
Total Protein: 6.7 g/dL (ref 6.5–8.1)

## 2019-02-23 LAB — LACTIC ACID, PLASMA: Lactic Acid, Venous: 0.8 mmol/L (ref 0.5–1.9)

## 2019-02-23 LAB — LIPASE, BLOOD: Lipase: 25 U/L (ref 11–51)

## 2019-02-23 LAB — PROTIME-INR
INR: 1.7 — ABNORMAL HIGH (ref 0.8–1.2)
Prothrombin Time: 19.5 seconds — ABNORMAL HIGH (ref 11.4–15.2)

## 2019-02-23 LAB — TROPONIN I: Troponin I: <0.03 ng/mL (ref <0.03)

## 2019-02-23 MED ORDER — MELATONIN 5 MG PO TABS
10.0000 mg | ORAL_TABLET | Freq: Every day | ORAL | Status: DC
  Filled 2019-02-23 (×4): qty 2

## 2019-02-23 MED ORDER — MORPHINE SULFATE (PF) 2 MG/ML IV SOLN
1.0000 mg | INTRAVENOUS | Status: DC | PRN
  Administered 2019-02-23 – 2019-02-27 (×12): 2 mg via INTRAVENOUS
  Filled 2019-02-23 (×12): qty 1

## 2019-02-23 MED ORDER — MORPHINE SULFATE (PF) 4 MG/ML IV SOLN
4.0000 mg | Freq: Once | INTRAVENOUS | Status: AC
  Administered 2019-02-23: 12:00:00 4 mg via INTRAVENOUS
  Filled 2019-02-23: qty 1

## 2019-02-23 MED ORDER — SODIUM CHLORIDE (PF) 0.9 % IJ SOLN
INTRAMUSCULAR | Status: AC
  Filled 2019-02-23: qty 50

## 2019-02-23 MED ORDER — ONDANSETRON HCL 4 MG/2ML IJ SOLN
4.0000 mg | Freq: Once | INTRAMUSCULAR | Status: AC
  Administered 2019-02-23: 4 mg via INTRAVENOUS
  Filled 2019-02-23: qty 2

## 2019-02-23 MED ORDER — KCL IN DEXTROSE-NACL 20-5-0.45 MEQ/L-%-% IV SOLN
INTRAVENOUS | Status: DC
  Administered 2019-02-23: 22:00:00 via INTRAVENOUS
  Filled 2019-02-23 (×2): qty 1000

## 2019-02-23 MED ORDER — SODIUM CHLORIDE 0.9 % IV BOLUS
1000.0000 mL | Freq: Once | INTRAVENOUS | Status: AC
  Administered 2019-02-23: 12:00:00 1000 mL via INTRAVENOUS

## 2019-02-23 MED ORDER — ENOXAPARIN SODIUM 80 MG/0.8ML ~~LOC~~ SOLN
1.0000 mg/kg | Freq: Two times a day (BID) | SUBCUTANEOUS | Status: DC
  Administered 2019-02-24 – 2019-02-26 (×6): 65 mg via SUBCUTANEOUS
  Filled 2019-02-23 (×6): qty 0.8

## 2019-02-23 MED ORDER — GABAPENTIN 300 MG PO CAPS
300.0000 mg | ORAL_CAPSULE | Freq: Three times a day (TID) | ORAL | Status: DC
  Filled 2019-02-23 (×3): qty 1

## 2019-02-23 MED ORDER — SODIUM CHLORIDE 0.9 % IV SOLN
INTRAVENOUS | Status: DC
  Administered 2019-02-23: 15:00:00 via INTRAVENOUS

## 2019-02-23 MED ORDER — SCOPOLAMINE 1 MG/3DAYS TD PT72
1.0000 | MEDICATED_PATCH | TRANSDERMAL | Status: DC
  Administered 2019-02-26: 04:00:00 1.5 mg via TRANSDERMAL
  Filled 2019-02-23 (×2): qty 1

## 2019-02-23 MED ORDER — SODIUM CHLORIDE 0.9% FLUSH: 10.0000 mL | INTRAVENOUS | Status: DC | PRN

## 2019-02-23 MED ORDER — PIPERACILLIN-TAZOBACTAM 3.375 G IVPB 30 MIN
3.3750 g | Freq: Once | INTRAVENOUS | Status: AC
  Administered 2019-02-23: 3.375 g via INTRAVENOUS
  Filled 2019-02-23: qty 50

## 2019-02-23 MED ORDER — IOHEXOL 300 MG/ML  SOLN
100.0000 mL | Freq: Once | INTRAMUSCULAR | Status: AC | PRN
  Administered 2019-02-23: 12:00:00 100 mL via INTRAVENOUS

## 2019-02-23 MED ORDER — POTASSIUM CHLORIDE 10 MEQ/100ML IV SOLN
10.0000 meq | INTRAVENOUS | Status: AC
  Administered 2019-02-23 (×4): 10 meq via INTRAVENOUS
  Filled 2019-02-23 (×4): qty 100

## 2019-02-23 MED ORDER — PIPERACILLIN-TAZOBACTAM 3.375 G IVPB
3.3750 g | Freq: Three times a day (TID) | INTRAVENOUS | Status: AC
  Administered 2019-02-23 – 2019-02-27 (×11): 3.375 g via INTRAVENOUS
  Filled 2019-02-23 (×11): qty 50

## 2019-02-23 MED ORDER — ONDANSETRON HCL 4 MG/2ML IJ SOLN: 4.0000 mg | Freq: Four times a day (QID) | INTRAMUSCULAR | Status: DC | PRN

## 2019-02-23 MED ORDER — PANTOPRAZOLE SODIUM 40 MG IV SOLR
40.0000 mg | INTRAVENOUS | Status: DC
  Administered 2019-02-23: 23:00:00 40 mg via INTRAVENOUS
  Filled 2019-02-23: qty 40

## 2019-02-23 MED ORDER — ENOXAPARIN SODIUM 80 MG/0.8ML ~~LOC~~ SOLN
1.0000 mg/kg | Freq: Once | SUBCUTANEOUS | Status: AC
  Administered 2019-02-23: 65 mg via SUBCUTANEOUS
  Filled 2019-02-23: qty 0.8

## 2019-02-23 MED ORDER — LIDOCAINE-PRILOCAINE 2.5-2.5 % EX CREA: 1.0000 "application " | TOPICAL_CREAM | CUTANEOUS | Status: DC | PRN

## 2019-02-23 NOTE — ED Notes (Signed)
Bed: XN17 Expected date:  Expected time:  Means of arrival:  Comments: EMS 68 yo cancer pt-n/v

## 2019-02-23 NOTE — ED Notes (Signed)
Patient transported to CT 

## 2019-02-23 NOTE — Progress Notes (Signed)
Pharmacy Antibiotic Note  Alison Sullivan is a 68 y.o. female admitted on 02/23/2019 with diverticulitis.  Pharmacy has been consulted for Zosyn dosing.  Plan:  Zosyn 3.375g IV x 1 over 30 minutes given in the ED. Continue with Zosyn 3.375g IV q8h (each dose infused over 4 hours).  Monitor renal function, cultures, clinical course.      Temp (24hrs), Avg:97.8 F (36.6 C), Min:97.8 F (36.6 C), Max:97.8 F (36.6 C)  Recent Labs  Lab 02/18/19 1002 02/22/19 1450 02/23/19 1212  WBC 5.3 6.5 4.2  CREATININE 0.84 0.79 0.61    Estimated Creatinine Clearance: 63.9 mL/min (by C-G formula based on SCr of 0.61 mg/dL).    Allergies  Allergen Reactions  . Contrast Media [Iodinated Diagnostic Agents] Diarrhea and Other (See Comments)    Stomach cramps - can take IV not oral liquid    Antimicrobials this admission: 4/11 Zosyn >>  Dose adjustments this admission: --  Microbiology results: GI panel, stool: ordered HIV antibody: ordered  Thank you for allowing pharmacy to be a part of this patient's care.   Lindell Spar, PharmD, BCPS Pager: [phone] 02/23/2019 4:14 PM

## 2019-02-23 NOTE — ED Notes (Signed)
Pt BIB [facility] for N/V/D. Seen yesterday in cancer center. Pt reports N/V/D worse today. Pt reports not being able to keep down meds/food/drink.  BP 112/72 HR 90 RR 18

## 2019-02-23 NOTE — Consult Note (Signed)
Re:   JOSELY MOFFAT DOB:   Mar 16, 1951 MRN:   [id number]  Chief Complaint abodminal pain  ASSESEMENT AND PLAN: 1.  Diverticulitis  Plan:  NPO (okay for ice chips), IVF, and IV antibiotics. I spoke to her son, Shea Stakes, on the phone and reviewed the plan.  2.  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   Follow by Dr. Ivin Poot.   On chemotx now.  I would involved oncology in her decision planning.  To see Dr. Durenda Hurt at [facility] for second opinion regarding her condition   3.  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 Isidore Moos - finished left rib RTx on 11/06/2018.  Finished T10 RTx on 02/08/2019 4.  History of UTI and ? pyelonephritis  Seen by Dr. Junious Silk 5.  Anticoagulated on Coumadin  For thrombosis involving her porta cath  Now placed on Lovenox 6.  History of kidney stones 7.  Smoked until last fall 8.  Severe atherosclerosis by CT scan  Chief Complaint  Patient presents with   Nausea   Emesis   Diarrhea   PHYSICIAN REQUESTING CONSULTATION:  Renae Gloss, PA, Karenann Cai  HISTORY OF PRESENT ILLNESS: LAUREN MODISETTE is a 68 y.o. (DOB: [birth year])  white female whose primary care physician is [name], MD  .  She presented to the [facility] with abdominal pain.  She has been actively treated for metastatic lung cancer.  Dr. Julien Nordmann is her oncologist.  She completed her last course of chemotherapy on Monday, April 6.  She has had some persistent left lower chest pain, presumably secondary to her metastatic disease to left 10th her rib.  She developed increasing weakness and vomiting after her last chemotherapy.  She presented to the cancer center yesterday, April 10, and received IV fluids.  But she had worsening symptoms and presented to the [facility] emergency room today.  She has no  history of stomach, liver, colonic disease.  She has had no prior history of diverticulitis.  She has had no prior abdominal surgery.  She has never had a colonoscopy.     CT scan of abdomen - 02/23/2019 - 1. Acute diverticulitis involving the proximal sigmoid colon with evidence of an intramural abscess in the wall of the sigmoid colon. No evidence of perforation. 2. Enlarging necrotic left pericardiophrenic lymph node since the January, 2020 CT. 3. Stable sub-5 mm liver lesions which are too small to characterize by CT. 4. Possible bilateral iliofemoral DVT, though this may be artifactual and related to incomplete opacification. Does the patient have clinical symptoms of BILATERAL LOWER extremity edema? If so, LOWER extremity venous Doppler ultrasound may be helpful to confirm a benign this finding. 5. Osseous metastasis involving the T10 vertebral body and the right pedicle of T10, more conspicuous than in January, 2020. Interval decrease in size of a LEFT tenth rib metastasis after treatment with radiation therapy. No new osseous metastases. 6. BILATERAL nephrolithiasis. No obstructing ureteral calculi. Severe scarring involving both kidneys. 7.  Aortic Atherosclerosis, severe.  (ICD10-170.0)    Past Medical History:  Diagnosis Date   Arthritis    Chronic kidney disease    Dog bite of wrist    History of kidney stones    History of radiation therapy 10/23/18- 11/06/18   Left lower rib mass/ 30 Gy delivered in 10 fractions 3  Gy.    Kidney infection    Left renal mass    Osteopenia    Pyelonephritis    Recurrent UTI (urinary tract infection)       Past Surgical History:  Procedure Laterality Date   CYSTOSCOPY WITH RETROGRADE PYELOGRAM, URETEROSCOPY AND STENT PLACEMENT Left 12/28/2017   Procedure: CYSTOSCOPY WITH RETROGRADE PYELOGRAM, URETEROSCOPY AND STENT PLACEMENT, STONE BASKETRY;  Surgeon: Festus Aloe, MD;  Location: [facility];  Service: Urology;   Laterality: Left;   FINGER SURGERY     index, growth removal   HOLMIUM LASER APPLICATION Left [id number]/73/7106   Procedure: HOLMIUM LASER APPLICATION;  Surgeon: Festus Aloe, MD;  Location: [facility];  Service: Urology;  Laterality: Left;   IR IMAGING GUIDED PORT INSERTION  10/31/2018   KIDNEY STONE SURGERY  2011 or 2012   LITHOTRIPSY  2007      Current Facility-Administered Medications  Medication Dose Route Frequency Provider Last Rate Last Dose   0.9 %  sodium chloride infusion   Intravenous Continuous Hosie Poisson, MD       potassium chloride 10 mEq in 100 mL IVPB  10 mEq Intravenous Q1 Hr x 4 Law, Alexandra M, PA-C       sodium chloride (PF) 0.9 % injection            Current Outpatient Medications  Medication Sig Dispense Refill   folic acid (FOLVITE) 1 MG tablet Take 1 tablet (1 mg total) by mouth daily. (Patient taking differently: Take 1 mg by mouth every evening. ) 30 tablet 4   gabapentin (NEURONTIN) 300 MG capsule Take 1 capsule (300 mg total) by mouth 3 (three) times daily. 90 capsule 0   lidocaine-prilocaine (EMLA) cream Apply 1 application topically as needed. (Patient taking differently: Apply 1 application topically as needed (For port-a-cath.). ) 30 g 0   Melatonin 10 MG TABS Take 10 mg by mouth at bedtime.      omeprazole (PRILOSEC) 40 MG capsule Take 1 capsule (40 mg total) by mouth daily. (Patient taking differently: Take 40 mg by mouth every evening. ) 30 capsule 2   oxyCODONE (OXY IR/ROXICODONE) 5 MG immediate release tablet Take 1-2 tablets (5-10 mg total) by mouth every 4 (four) hours as needed for severe pain. 120 tablet 0   prochlorperazine (COMPAZINE) 10 MG tablet Take 1 tablet (10 mg total) by mouth every 6 (six) hours as needed for nausea or vomiting. 30 tablet 0   scopolamine (TRANSDERM-SCOP) 1 MG/3DAYS Place 1 patch (1.5 mg total) onto the skin every 3 (three) days. 10 patch 12   simvastatin (ZOCOR) 20 MG tablet Take 1 tablet  (20 mg total) by mouth daily at 6 PM. Need annual appointment for further refills (Patient taking differently: Take 20 mg by mouth every evening. ) 90 tablet 0   warfarin (COUMADIN) 5 MG tablet TAKE 1 TABLET(5 MG) BY MOUTH DAILY (Patient taking differently: Take 2.5-5 mg by mouth every evening. Take half a tablet on Monday and Thursday and one tablet the rest of the week.) 90 tablet 1   lidocaine (XYLOCAINE) 2 % solution Use as directed 5 mLs in the mouth or throat every 3 (three) hours as needed for mouth pain. May mix with Mylanta before taking if desired. (Patient not taking: Reported on 02/23/2019) 200 mL 1   sucralfate (CARAFATE) 1 g tablet DISSOLVE 1 TABLET IN 1 OUNCE OF WATER AND DRINK FOUR TIMES DAILY. TAKE 2 HOURS BEFORE OR 1 HOUR AFTER TAKING OTHER MEDICATIONS (  Patient not taking: Reported on 02/23/2019) 360 tablet 0      Allergies  Allergen Reactions   Contrast Media [Iodinated Diagnostic Agents] Diarrhea and Other (See Comments)    Stomach cramps - can take IV not oral liquid    REVIEW OF SYSTEMS: Skin:  No history of rash.  No history of abnormal moles. Infection:  No history of hepatitis or HIV.  No history of MRSA. Neurologic:  No history of stroke.  No history of seizure.  No history of headaches. Cardiac:  No history of hypertension. No history of heart disease.  No history of prior cardiac catheterization.  No history of seeing a cardiologist. Pulmonary:  See HPI.  She quit smoking last fall.  Endocrine:  No diabetes. No thyroid disease. Gastrointestinal:  No history of stomach disease.  No history of liver disease.  No history of gall bladder disease.  No history of pancreas disease.  No history of colon disease. Urologic:  History of kidney stones and recurrent UTI"s.  She sees Dr. Junious Silk. Musculoskeletal:  Has had back and left chest pain - presumably secondary to metastatic disease. Psycho-social:  The patient is oriented.   The patient has no obvious psychologic or  social impairment to understanding our conversation and plan.  SOCIAL and FAMILY HISTORY: Divorced.  Lives by self. Has one son, Shea Stakes, 58 yo at [employer] in graduate school for [employer]. 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  PHYSICAL EXAM: BP (!) 83/59    Pulse 76    Temp 97.8 F (36.6 C) (Oral)    Resp 19    SpO2 97%   General: WN WF who is alert and generally healthy appearing.  Skin:  Inspection and palpation - no mass or rash. Eyes:  Conjunctiva and lids unremarkable.            Pupils are equal Ears, Nose, Mouth, and Throat:  Ears and nose unremarkable            Lips and teeth are unremarable. Neck: Supple. No mass, trachea midline.  No thyroid mass. Lymph Nodes:  No supraclavicular or cervical..  Lungs: Normal respiratory effort.  Clear to auscultation and symmetric breath sounds.  She has a port in the upper right chest Heart:  Palpation of the heart is normal.            Auscultation: RRR. No murmur or rub.  Abdomen: Soft. No mass.  No hernia. No abdominal scars.  Decreased bowel sounds.  Some tenderness in the LLQ, but she complains of more pain in her left lower chest. Rectal: Not done. Musculoskeletal:  Good muscle strength and ROM  in upper and lower extremities.  Neurologic:  Grossly intact to motor and sensory function. Psychiatric: Normal judgement and insight. Behavior is normal.            Oriented to time, person, place.   DATA REVIEWED, COUNSELING AND COORDINATION OF CARE: Epic notes reviewed. Counseling and coordination of care exceeded more than 50% of the time spent with patient. Total time spent with patient and charting: 45 minutes  Alphonsa Overall, MD,  [facility], [address]  [place], [place]    [place] Phone:  [phone] FAX:  [phone]

## 2019-02-23 NOTE — Progress Notes (Signed)
VASCULAR LAB PRELIMINARY  PRELIMINARY  PRELIMINARY  PRELIMINARY  Bilateral lower extremity venous duplex completed.    Preliminary report:  See CV proc for preliminary results.  Melisia Leming, RVT 02/23/2019, 7:32 PM

## 2019-02-23 NOTE — Progress Notes (Addendum)
ANTICOAGULATION CONSULT NOTE - Initial Consult  Pharmacy Consult for Enoxaparin Indication: VTE treatment  Allergies  Allergen Reactions  . Contrast Media [Iodinated Diagnostic Agents] Diarrhea and Other (See Comments)    Stomach cramps - can take IV not oral liquid    Patient Measurements:    Height: 5\' 6"  Weight: 65.8 kg   Vital Signs: Temp: 98.4 F (36.9 C) (04/11 1627) Temp Source: Oral (04/11 1627) BP: 104/54 (04/11 1627) Pulse Rate: 59 (04/11 1627)  Labs: Recent Labs    02/22/19 1450 02/23/19 1212 02/23/19 1213  HGB 11.3* 9.6*  --   HCT 33.7* 28.8*  --   PLT 317 235  --   LABPROT  --  19.5*  --   INR  --  1.7*  --   CREATININE 0.79 0.61  --   TROPONINI  --   --  <0.03    Estimated Creatinine Clearance: 63.9 mL/min (by C-G formula based on SCr of 0.61 mg/dL).   Medical History: Past Medical History:  Diagnosis Date  . Arthritis   . Chronic kidney disease   . Dog bite of wrist   . History of kidney stones   . History of radiation therapy 10/23/18- 11/06/18   Left lower rib mass/ 30 Gy delivered in 10 fractions 3 Gy.   . Kidney infection   . Left renal mass   . Osteopenia   . Pyelonephritis   . Recurrent UTI (urinary tract infection)     Medications:  PTA warfarin 2.5mg  on Monday/Thursday, 5mg  on all other days of the week  Assessment: 89 y/oF with PMH of high-grade neuroendocrine carcinoma with metastatic bone disease, non-occlusive thrombosis of port-A-cath on warfarin PTA. CT today also with possible bilateral iliofemoral DVT. Venous duplex of the lower extremities ordered. Warfarin held on admission. Pharmacy consulted for enoxaparin dosing. INR = 1.7. Hgb 9.6, Pltc WNL. SCr 0.61 with CrCl ~ 64 ml/min.   Goal of Therapy:  Anti-Xa level 0.6-1 units/ml 4hrs after LMWH dose given Monitor platelets by anticoagulation protocol: Yes   Plan:   Enoxaparin 1mg /kg SQ q12h  Monitor renal function  Monitor CBC at least q72h  Watch for s/sx of  bleeding   Lindell Spar, PharmD, BCPS Pager: [phone] 02/23/2019 5:17 PM

## 2019-02-23 NOTE — H&P (Signed)
History and Physical    Alison Sullivan IOE:[id number] DOB: 07-Sep-1951 DOA: 02/23/2019  PCP: [name], MD ( Patient coming from: Home.   I have personally briefly reviewed patient's old medical records in [place]  Chief Complaint: nausea, vomiting and abdominal pain with diarrhea since 5 days   HPI: Alison Sullivan is a 68 y.o. 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 The abdominal pain is mod to severe, non radiating, left lower quadrant, . She denies fevers or chills, no sob or cough or dysuria. She reports lateral chest wall pain where her metastatic lesions are.   ED Course: she was afebrile, slightly hypotensive, and tachycardic on arrival. Labs revealed hyponatremia, hypokalemia, normal wbc count and hemoglobin of 9.6. INR of 1.7.    CT abd and pelvis done and showed Acute diverticulitis involving the proximal sigmoid colon with evidence of an intramural abscess in the wall of the sigmoid colon. No evidence of perforation. Enlarging necrotic left pericardiophrenic lymph node since the January, 2020 CT. 3. Stable sub-5 mm liver lesions which are too small to characterize by CT. 4. Possible bilateral iliofemoral DVT, though this may be artifactual and related to incomplete opacification. Does the patient have clinical symptoms of BILATERAL LOWER extremity edema? If so, LOWER extremity venous Doppler ultrasound may be helpful to confirm a  benign this finding. 5. Osseous metastasis involving the T10 vertebral body and the right pedicle of T10, more conspicuous than in January, 2020. Interval decrease in size of a LEFT tenth rib metastasis after treatment with radiation therapy. No new osseous metastases.  She was referred to medical service for admission and Dr Jacinto Reap with surgery consulted.   Review of Systems: As per HPI otherwise 10 point review of systems negative.   Past Medical History:  Diagnosis Date   Arthritis    Chronic kidney disease    Dog bite of wrist    History of kidney stones    History of radiation therapy 10/23/18- 11/06/18   Left lower rib mass/ 30 Gy delivered in 10 fractions 3 Gy.    Kidney infection    Left renal mass    Osteopenia    Pyelonephritis    Recurrent UTI (urinary tract infection)     Past Surgical History:  Procedure Laterality Date   CYSTOSCOPY WITH RETROGRADE PYELOGRAM, URETEROSCOPY AND STENT PLACEMENT Left 12/28/2017   Procedure: CYSTOSCOPY WITH RETROGRADE PYELOGRAM, URETEROSCOPY AND STENT PLACEMENT, STONE BASKETRY;  Surgeon: Festus Aloe, MD;  Location: [facility];  Service: Urology;  Laterality: Left;   FINGER SURGERY     index, growth removal   HOLMIUM LASER APPLICATION Left 1/82/9937   Procedure: HOLMIUM LASER APPLICATION;  Surgeon: Festus Aloe, MD;  Location: [facility];  Service: Urology;  Laterality: Left;   IR IMAGING GUIDED PORT INSERTION  10/31/2018   KIDNEY STONE SURGERY  2011 or 2012   LITHOTRIPSY  2007     reports that she has quit smoking. Her smoking use included cigarettes. She has a 7.50  pack-year smoking history. She has never used smokeless tobacco. She reports current alcohol use. She reports that she does not use drugs.  Allergies  Allergen Reactions   Contrast Media [Iodinated Diagnostic Agents] Diarrhea and Other (See Comments)    Stomach cramps - can take IV not oral liquid    Family  History  Problem Relation Age of Onset   Cancer Father        pancreatic   Melanoma Father     Family history reviewed and not pertinent   Prior to Admission medications   Medication Sig Start Date End Date Taking? Authorizing Provider  folic acid (FOLVITE) 1 MG tablet Take 1 tablet (1 mg total) by mouth daily. Patient taking differently: Take 1 mg by mouth every evening.  12/10/18  Yes Curt Bears, MD  gabapentin (NEURONTIN) 300 MG capsule Take 1 capsule (300 mg total) by mouth 3 (three) times daily. 02/05/19  Yes [name], MD  lidocaine-prilocaine (EMLA) cream Apply 1 application topically as needed. Patient taking differently: Apply 1 application topically as needed (For port-a-cath.).  10/18/18  Yes Curt Bears, MD  Melatonin 10 MG TABS Take 10 mg by mouth at bedtime.    Yes [provider]  omeprazole (PRILOSEC) 40 MG capsule Take 1 capsule (40 mg total) by mouth daily. Patient taking differently: Take 40 mg by mouth every evening.  02/13/19  Yes Tanner, Lyndon Code., PA-C  oxyCODONE (OXY IR/ROXICODONE) 5 MG immediate release tablet Take 1-2 tablets (5-10 mg total) by mouth every 4 (four) hours as needed for severe pain. 02/04/19  Yes [name], MD  prochlorperazine (COMPAZINE) 10 MG tablet Take 1 tablet (10 mg total) by mouth every 6 (six) hours as needed for nausea or vomiting. 10/18/18  Yes Curt Bears, MD  scopolamine (TRANSDERM-SCOP) 1 MG/3DAYS Place 1 patch (1.5 mg total) onto the skin every 3 (three) days. 02/22/19  Yes Tanner, Lyndon Code., PA-C  simvastatin (ZOCOR) 20 MG tablet Take 1 tablet (20 mg total) by mouth daily at 6 PM. Need annual appointment for further refills Patient taking differently: Take 20 mg by mouth every evening.  02/12/19  Yes [name], MD  warfarin (COUMADIN) 5 MG tablet TAKE 1 TABLET(5 MG) BY MOUTH DAILY Patient taking differently: Take 2.5-5 mg by mouth every evening. Take half a tablet on Monday and Thursday and one tablet the  rest of the week. 12/21/18  Yes Curt Bears, MD  lidocaine (XYLOCAINE) 2 % solution Use as directed 5 mLs in the mouth or throat every 3 (three) hours as needed for mouth pain. May mix with Mylanta before taking if desired. Patient not taking: Reported on 02/23/2019 02/13/19   Sandi Mealy E., PA-C  sucralfate (CARAFATE) 1 g tablet DISSOLVE 1 TABLET IN 1 OUNCE OF WATER AND DRINK FOUR TIMES DAILY. TAKE 2 HOURS BEFORE OR 1 HOUR AFTER TAKING OTHER MEDICATIONS Patient not taking: Reported on 02/23/2019 02/14/19   Harle Stanford., PA-C    Physical Exam: Vitals:   02/23/19 1400 02/23/19 1415 02/23/19 1430 02/23/19 1500  BP: (!) 83/59  (!) 112/59 122/71  Pulse: 76 88 60 77  Resp: 19 (!) 22 14 16   Temp:      TempSrc:      SpO2: 97% 98% 98% 96%    Constitutional: NAD, calm, comfortable Vitals:   02/23/19 1400 02/23/19 1415 02/23/19 1430 02/23/19 1500  BP: (!) 83/59  (!) 112/59 122/71  Pulse: 76 88 60 77  Resp: 19 (!) 22 14  16  Temp:      TempSrc:      SpO2: 97% 98% 98% 96%   Eyes: PERRL, lids and conjunctivae normal ENMT: Mucous membranes are dry.  Neck: normal, supple, no masses, no thyromegaly Respiratory: diminished air entry at bases.  Cardiovascular: Regular rate and rhythm, no murmurs / rubs / gallops. No extremity edema. 2+ pedal pulses. No carotid bruits.  Abdomen: soft tender in the LLq,  Musculoskeletal: no clubbing / cyanosis. No joint deformity upper and lower extremities. Good ROM, no contractures. Normal muscle tone.  Skin: no rashes, lesions, ulcers. No induration Neurologic: CN 2-12 grossly intact. Sensation intact, DTR normal. Strength 5/5 in all 4.  Psychiatric: Normal judgment and insight. Alert and oriented x 3. Normal mood.     Labs on Admission: I have personally reviewed following labs and imaging studies  CBC: Recent Labs  Lab 02/18/19 1002 02/22/19 1450 02/23/19 1212  WBC 5.3 6.5 4.2  NEUTROABS 3.7 6.1 3.9  HGB 9.9* 11.3* 9.6*  HCT 30.9* 33.7* 28.8*    MCV 104.4* 100.3* 101.8*  PLT 242 317 989   Basic Metabolic Panel: Recent Labs  Lab 02/18/19 1002 02/22/19 1450 02/23/19 1212  NA 141 139 134*  K 3.3* 3.9 3.1*  CL 104 103 104  CO2 26 20* 21*  GLUCOSE 130* 111* 116*  BUN 12 20 19   CREATININE 0.84 0.79 0.61  CALCIUM 8.5* 9.0 8.1*  MG  --  1.8  --    GFR: Estimated Creatinine Clearance: 63.9 mL/min (by C-G formula based on SCr of 0.61 mg/dL). Liver Function Tests: Recent Labs  Lab 02/18/19 1002 02/22/19 1450 02/23/19 1212  AST 21 21 23   ALT 16 14 16   ALKPHOS 96 106 80  BILITOT 0.4 0.7 0.9  PROT 6.8 7.6 6.7  ALBUMIN 3.2* 3.4* 3.2*   Recent Labs  Lab 02/23/19 1212  LIPASE 25   No results for input(s): AMMONIA in the last 168 hours. Coagulation Profile: Recent Labs  Lab 02/18/19 1001 02/23/19 1212  INR 4.4* 1.7*   Cardiac Enzymes: Recent Labs  Lab 02/23/19 1213  TROPONINI <0.03   BNP (last 3 results) No results for input(s): PROBNP in the last 8760 hours. HbA1C: No results for input(s): HGBA1C in the last 72 hours. CBG: No results for input(s): GLUCAP in the last 168 hours. Lipid Profile: No results for input(s): CHOL, HDL, LDLCALC, TRIG, CHOLHDL, LDLDIRECT in the last 72 hours. Thyroid Function Tests: No results for input(s): TSH, T4TOTAL, FREET4, T3FREE, THYROIDAB in the last 72 hours. Anemia Panel: No results for input(s): VITAMINB12, FOLATE, FERRITIN, TIBC, IRON, RETICCTPCT in the last 72 hours. Urine analysis:    Component Value Date/Time   COLORURINE YELLOW 02/15/2019 1335   APPEARANCEUR CLOUDY (A) 02/15/2019 1335   LABSPEC 1.041 (H) 02/15/2019 1335   PHURINE 7.0 02/15/2019 1335   GLUCOSEU NEGATIVE 02/15/2019 1335   GLUCOSEU NEGATIVE 07/05/2017 1136   HGBUR SMALL (A) 02/15/2019 1335   BILIRUBINUR NEGATIVE 02/15/2019 1335   BILIRUBINUR neg 03/16/2018 1530   KETONESUR NEGATIVE 02/15/2019 1335   PROTEINUR 30 (A) 02/15/2019 1335   UROBILINOGEN 0.2 03/16/2018 1530   UROBILINOGEN 0.2  07/05/2017 1136   NITRITE NEGATIVE 02/15/2019 1335   LEUKOCYTESUR LARGE (A) 02/15/2019 1335    Radiological Exams on Admission: Ct Abdomen Pelvis W Contrast  Result Date: 02/23/2019 CLINICAL DATA:  68 year old presenting with acute onset of LEFT LOWER abdominal pain and nausea/vomiting that began yesterday. Current history of urinary tract calculi. Current history of stage  IV high-grade neuroendocrine lung carcinoma with small and large cell features of the LEFT UPPER LOBE for which the patient has undergone chemotherapy, immunotherapy and radiation therapy to thoracic spine and LEFT rib metastases. EXAM: CT ABDOMEN AND PELVIS WITH CONTRAST TECHNIQUE: Multidetector CT imaging of the abdomen and pelvis was performed using the standard protocol following bolus administration of intravenous contrast. CONTRAST:  169mL OMNIPAQUE IOHEXOL 300 MG/ML IV. COMPARISON:  02/15/2019 and earlier, including PET-CT 09/18/2018. FINDINGS: Lower chest: Visualized lung bases clear apart from the expected dependent atelectasis posteriorly in the lower lobes. Stable mild BILATERAL lower lobe bronchiectasis. Stable normal heart size. Necrotic LEFT pericardiophrenic lymph node measuring approximately 2.6 x 3.3 x 3.6 cm, slightly increased in size since the January, 2020 CT. Hepatobiliary: Multiple sub-5 mm low-attenuation lesions throughout both lobes of the liver, unchanged since the January, 2020 examination. No new or enlarging liver lesions. Gallbladder normal in appearance without calcified gallstones. No biliary ductal dilation. Pancreas: Normal in appearance without evidence of mass, ductal dilation, or inflammation. Spleen: Normal in size and appearance. Adrenals/Urinary Tract: Normal appearing adrenal glands. Severe scarring involving both kidneys. BILATERAL nephrolithiasis, the largest stone measuring approximately 1 cm in a LOWER pole calyx of the RIGHT kidney. No obstructing ureteral calculi on either side. Cortical  cysts and calyceal diverticula involving both kidneys. No convincing solid masses involving either kidney. Large extrarenal pelvis on the LEFT and small extrarenal pelvis on the RIGHT. Normal appearing decompressed urinary bladder. Stomach/Bowel: Stomach normal in appearance for the degree of distention. Normal-appearing small bowel. Entire colon relatively decompressed. Mild wall thickening diffusely throughout the decompressed colon. Distal descending and sigmoid colon diverticulosis without evidence of acute diverticulitis involving the proximal sigmoid colon in the LEFT side of the mid pelvis. Complex fluid collection in this location which is likely intramural. No evidence of extraluminal gas. Appendix not clearly demonstrated, but no pericecal inflammation. Vascular/Lymphatic: Severe aortoiliofemoral atherosclerosis with calcified and noncalcified plaque involving the abdominal aorta. Possible filling defects involving the BILATERAL iliofemoral veins, though this may be artifactual and related to incomplete opacification. No pathologic lymphadenopathy. Reproductive: Normal appearing atrophic uterus consistent with patient age. No adnexal masses. Other: None. Musculoskeletal: LEFT posterolateral tenth rib metastasis measuring approximately 3.8 x 1.9 cm, decreased in size since the prior PET-CT after radiation treatment. Mixed lytic and sclerotic metastasis involving the T10 vertebral body and the RIGHT pedicle of T10, more conspicuous than on the January, 2020 examination. No new osseous metastases elsewhere. IMPRESSION: 1. Acute diverticulitis involving the proximal sigmoid colon with evidence of an intramural abscess in the wall of the sigmoid colon. No evidence of perforation. 2. Enlarging necrotic left pericardiophrenic lymph node since the January, 2020 CT. 3. Stable sub-5 mm liver lesions which are too small to characterize by CT. 4. Possible bilateral iliofemoral DVT, though this may be artifactual and  related to incomplete opacification. Does the patient have clinical symptoms of BILATERAL LOWER extremity edema? If so, LOWER extremity venous Doppler ultrasound may be helpful to confirm a benign this finding. 5. Osseous metastasis involving the T10 vertebral body and the right pedicle of T10, more conspicuous than in January, 2020. Interval decrease in size of a LEFT tenth rib metastasis after treatment with radiation therapy. No new osseous metastases. 6. BILATERAL nephrolithiasis. No obstructing ureteral calculi. Severe scarring involving both kidneys. 7.  Aortic Atherosclerosis, severe.  (ICD10-170.0) Electronically Signed   By: Evangeline Dakin M.D.   On: 02/23/2019 13:40    EKG:  Assessment/Plan Active Problems:   Diverticulitis  Acute diverticulitis Admit for pain control, symptomatic management with IV fluids, IV pain control and IV anti emetics.  Surgery consulted for recommendations.    Hyponatremia probably from dehydration  hydrate and repeat renal parameters in am.    Hypokalemia:  Replaced.    Stage IV (T1b,N2, M1c)high-grade neuroendocrine carcinoma of the lung: - further management as per Dr Julien Nordmann.  Last chemo on April 6 th 2020.    Metastatic disease to the bone Palliative radiotherapy as per Dr Isidore Moos.     Non occlusive thrombosis of the Port A Cath And questionable DVT of the iliofemoral veins  Venous duplex of the lower extremities ordered.  Started her on LOVENOX injections. Therapeutic dose.        DVT prophylaxis: lovenox.  Code Status: full code.  Family Communication: none at bedside,called son.  Disposition Plan: pending clinical improvement.  Consults called: surgery Dr Lucia Gaskins.  Admission status: inpatient/ med surg.    Hosie Poisson MD [facility] Pager [phone]   If 7PM-7AM, please contact night-coverage [email or web address] Password [facility]  02/23/2019, 3:35 PM

## 2019-02-23 NOTE — ED Provider Notes (Signed)
[facility] DEPT Provider Note   CSN: [id number] Arrival date & time: 02/23/19  1128    History   Chief Complaint Chief Complaint  Patient presents with   Nausea   Emesis   Diarrhea    HPI Alison Sullivan is a 67 y.o. 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  She denies any bloody stools.  She has had some abdominal pain intermittently that is worse before having diarrhea.  She denies any urinary symptoms.  She denies any fever, chest pain, shortness of breath.  Patient reports pain in her left rib cage which has been present for a while as she has a known tumor there, however she did not have pain for a while and then it returned.  Patient went to the cancer center yesterday and got infusion of fluids and morphine and had improvement of nausea and vomiting only while there.  She was discharged home with scopolamine patches which did not improve her nausea and vomiting.  She has not been able to take her home medications or eat or drink.     HPI  Past Medical History:  Diagnosis Date   Arthritis    Chronic kidney disease    Dog bite of wrist    History of kidney stones    History of radiation therapy 10/23/18- 11/06/18   Left lower rib mass/ 30 Gy delivered in 10 fractions 3 Gy.    Kidney infection    Left renal mass    Osteopenia    Pyelonephritis    Recurrent UTI (urinary tract infection)     Patient Active Problem List   Diagnosis Date Noted   Diverticulitis 02/23/2019   Long term current use of anticoagulant therapy 01/10/2019   Port-A-Cath in place 12/20/2018   Large cell carcinoma of left lung, stage 4 ([place]) 10/18/2018   Encounter for antineoplastic chemotherapy 10/18/2018   Encounter for antineoplastic immunotherapy 10/18/2018   Bone metastases  ([place]) 10/10/2018   Bronchiectasis without complication ([place]) 95/18/8416   COPD GOLD ? II  Still smoking 09/12/2018   Cigarette smoker 09/12/2018   Pulmonary nodule 09/11/2018   Left-sided back pain 08/06/2018   Rib pain on left side 08/06/2018   Acute thoracic back pain 07/05/2018   UTI symptoms 03/17/2018   Closed nondisplaced fracture of lateral malleolus of left fibula 03/06/2018   Pain in left ankle and joints of left foot 02/13/2018   Right low back pain 02/09/2018   Fall 02/09/2018   Right ankle pain 02/09/2018   Left ankle pain 02/09/2018   Hyperlipidemia 01/10/2018   Routine general medical examination at a health care facility 01/02/2015   Insomnia 01/02/2015   Nephrolithiasis 08/11/2013    Past Surgical History:  Procedure Laterality Date   CYSTOSCOPY WITH RETROGRADE PYELOGRAM, URETEROSCOPY AND STENT PLACEMENT Left 12/28/2017   Procedure: CYSTOSCOPY WITH RETROGRADE PYELOGRAM, URETEROSCOPY AND STENT PLACEMENT, STONE BASKETRY;  Surgeon: Festus Aloe, MD;  Location: [facility];  Service: Urology;  Laterality: Left;   FINGER SURGERY     index, growth removal   HOLMIUM LASER APPLICATION Left 04/20/3015   Procedure: HOLMIUM LASER APPLICATION;  Surgeon: Festus Aloe, MD;  Location: [facility];  Service: Urology;  Laterality: Left;   IR IMAGING GUIDED PORT INSERTION  10/31/2018   KIDNEY STONE SURGERY  2011 or 2012  LITHOTRIPSY  2007     OB History   No obstetric history on file.      Home Medications    Prior to Admission medications   Medication Sig Start Date End Date Taking? Authorizing Provider  folic acid (FOLVITE) 1 MG tablet Take 1 tablet (1 mg total) by mouth daily. Patient taking differently: Take 1 mg by mouth every evening.  12/10/18  Yes Curt Bears, MD  gabapentin (NEURONTIN) 300 MG capsule Take 1 capsule (300 mg total) by mouth 3 (three) times daily. 02/05/19  Yes [name], MD    lidocaine-prilocaine (EMLA) cream Apply 1 application topically as needed. Patient taking differently: Apply 1 application topically as needed (For port-a-cath.).  10/18/18  Yes Curt Bears, MD  Melatonin 10 MG TABS Take 10 mg by mouth at bedtime.    Yes [provider]  omeprazole (PRILOSEC) 40 MG capsule Take 1 capsule (40 mg total) by mouth daily. Patient taking differently: Take 40 mg by mouth every evening.  02/13/19  Yes Tanner, Lyndon Code., PA-C  oxyCODONE (OXY IR/ROXICODONE) 5 MG immediate release tablet Take 1-2 tablets (5-10 mg total) by mouth every 4 (four) hours as needed for severe pain. 02/04/19  Yes [name], MD  prochlorperazine (COMPAZINE) 10 MG tablet Take 1 tablet (10 mg total) by mouth every 6 (six) hours as needed for nausea or vomiting. 10/18/18  Yes Curt Bears, MD  scopolamine (TRANSDERM-SCOP) 1 MG/3DAYS Place 1 patch (1.5 mg total) onto the skin every 3 (three) days. 02/22/19  Yes Tanner, Lyndon Code., PA-C  simvastatin (ZOCOR) 20 MG tablet Take 1 tablet (20 mg total) by mouth daily at 6 PM. Need annual appointment for further refills Patient taking differently: Take 20 mg by mouth every evening.  02/12/19  Yes [name], MD  warfarin (COUMADIN) 5 MG tablet TAKE 1 TABLET(5 MG) BY MOUTH DAILY Patient taking differently: Take 2.5-5 mg by mouth every evening. Take half a tablet on Monday and Thursday and one tablet the rest of the week. 12/21/18  Yes Curt Bears, MD  lidocaine (XYLOCAINE) 2 % solution Use as directed 5 mLs in the mouth or throat every 3 (three) hours as needed for mouth pain. May mix with Mylanta before taking if desired. Patient not taking: Reported on 02/23/2019 02/13/19   Sandi Mealy E., PA-C  sucralfate (CARAFATE) 1 g tablet DISSOLVE 1 TABLET IN 1 OUNCE OF WATER AND DRINK FOUR TIMES DAILY. TAKE 2 HOURS BEFORE OR 1 HOUR AFTER TAKING OTHER MEDICATIONS Patient not taking: Reported on 02/23/2019 02/14/19   Harle Stanford., PA-C    Family  History Family History  Problem Relation Age of Onset   Cancer Father        pancreatic   Melanoma Father     Social History Social History   Tobacco Use   Smoking status: Former Smoker    Packs/day: 0.25    Years: 30.00    Pack years: 7.50    Types: Cigarettes   Smokeless tobacco: Never Used   Tobacco comment: She quit early December 2019  Substance Use Topics   Alcohol use: Yes    Comment: occ   Drug use: No     Allergies   Contrast media [iodinated diagnostic agents]   Review of Systems Review of Systems  Constitutional: Negative for chills and fever.  HENT: Negative for facial swelling and sore throat.   Respiratory: Negative for shortness of breath.   Cardiovascular: Negative for chest pain.  Gastrointestinal: Positive  for abdominal pain, diarrhea, nausea and vomiting.  Genitourinary: Positive for flank pain (L rib). Negative for dysuria.  Musculoskeletal: Negative for back pain.  Skin: Negative for rash and wound.  Neurological: Negative for headaches.  Psychiatric/Behavioral: The patient is not nervous/anxious.      Physical Exam Updated Vital Signs BP (!) 112/59    Pulse 60    Temp 97.8 F (36.6 C) (Oral)    Resp 14    SpO2 98%   Physical Exam Vitals signs and nursing note reviewed.  Constitutional:      General: She is not in acute distress.    Appearance: She is well-developed. She is not diaphoretic.  HENT:     Head: Normocephalic and atraumatic.     Mouth/Throat:     Pharynx: No oropharyngeal exudate.  Eyes:     General: No scleral icterus.       Right eye: No discharge.        Left eye: No discharge.     Conjunctiva/sclera: Conjunctivae normal.     Pupils: Pupils are equal, round, and reactive to light.  Neck:     Musculoskeletal: Normal range of motion and neck supple.     Thyroid: No thyromegaly.  Cardiovascular:     Rate and Rhythm: Normal rate and regular rhythm.     Heart sounds: Normal heart sounds. No murmur. No friction  rub. No gallop.   Pulmonary:     Effort: Pulmonary effort is normal. No respiratory distress.     Breath sounds: Normal breath sounds. No stridor. No wheezing or rales.  Chest:    Abdominal:     General: Bowel sounds are normal. There is no distension.     Palpations: Abdomen is soft.     Tenderness: There is abdominal tenderness in the left lower quadrant. There is no guarding or rebound.  Lymphadenopathy:     Cervical: No cervical adenopathy.  Skin:    General: Skin is warm and dry.     Coloration: Skin is not pale.     Findings: No rash.  Neurological:     Mental Status: She is alert.     Coordination: Coordination normal.      ED Treatments / Results  Labs (all labs ordered are listed, but only abnormal results are displayed) Labs Reviewed  CBC WITH DIFFERENTIAL/PLATELET - Abnormal; Notable for the following components:      Result Value   RBC 2.83 (*)    Hemoglobin 9.6 (*)    HCT 28.8 (*)    MCV 101.8 (*)    RDW 17.5 (*)    Lymphs Abs 0.2 (*)    All other components within normal limits  COMPREHENSIVE METABOLIC PANEL - Abnormal; Notable for the following components:   Sodium 134 (*)    Potassium 3.1 (*)    CO2 21 (*)    Glucose, Bld 116 (*)    Calcium 8.1 (*)    Albumin 3.2 (*)    All other components within normal limits  PROTIME-INR - Abnormal; Notable for the following components:   Prothrombin Time 19.5 (*)    INR 1.7 (*)    All other components within normal limits  LIPASE, BLOOD  TROPONIN I  LACTIC ACID, PLASMA    EKG EKG Interpretation  Date/Time:  Saturday February 23 2019 11:36:56 EDT Ventricular Rate:  80 PR Interval:    QRS Duration: 87 QT Interval:  390 QTC Calculation: 450 R Axis:   43 Text Interpretation:  Sinus rhythm  Borderline short PR interval Abnormal R-wave progression, early transition Repol abnrm suggests ischemia, anterolateral new changes noted in v1,v2 Confirmed by Lacretia Leigh ([id number]) on 02/23/2019 11:43:33  AM   Radiology Ct Abdomen Pelvis W Contrast  Result Date: 02/23/2019 CLINICAL DATA:  68 year old presenting with acute onset of LEFT LOWER abdominal pain and nausea/vomiting that began yesterday. Current history of urinary tract calculi. 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 EXAM: CT ABDOMEN AND PELVIS WITH CONTRAST TECHNIQUE: Multidetector CT imaging of the abdomen and pelvis was performed using the standard protocol following bolus administration of intravenous contrast. CONTRAST:  117mL OMNIPAQUE IOHEXOL 300 MG/ML IV. COMPARISON:  02/15/2019 and earlier, including PET-CT 09/18/2018. FINDINGS: Lower chest: Visualized lung bases clear apart from the expected dependent atelectasis posteriorly in the lower lobes. Stable mild BILATERAL lower lobe bronchiectasis. Stable normal heart size. Necrotic LEFT pericardiophrenic lymph node measuring approximately 2.6 x 3.3 x 3.6 cm, slightly increased in size since the January, 2020 CT. Hepatobiliary: Multiple sub-5 mm low-attenuation lesions throughout both lobes of the liver, unchanged since the January, 2020 examination. No new or enlarging liver lesions. Gallbladder normal in appearance without calcified gallstones. No biliary ductal dilation. Pancreas: Normal in appearance without evidence of mass, ductal dilation, or inflammation. Spleen: Normal in size and appearance. Adrenals/Urinary Tract: Normal appearing adrenal glands. Severe scarring involving both kidneys. BILATERAL nephrolithiasis, the largest stone measuring approximately 1 cm in a LOWER pole calyx of the RIGHT kidney. No obstructing ureteral calculi on either side. Cortical cysts and calyceal diverticula involving both kidneys. No convincing solid masses involving either kidney. Large  extrarenal pelvis on the LEFT and small extrarenal pelvis on the RIGHT. Normal appearing decompressed urinary bladder. Stomach/Bowel: Stomach normal in appearance for the degree of distention. Normal-appearing small bowel. Entire colon relatively decompressed. Mild wall thickening diffusely throughout the decompressed colon. Distal descending and sigmoid colon diverticulosis without evidence of acute diverticulitis involving the proximal sigmoid colon in the LEFT side of the mid pelvis. Complex fluid collection in this location which is likely intramural. No evidence of extraluminal gas. Appendix not clearly demonstrated, but no pericecal inflammation. Vascular/Lymphatic: Severe aortoiliofemoral atherosclerosis with calcified and noncalcified plaque involving the abdominal aorta. Possible filling defects involving the BILATERAL iliofemoral veins, though this may be artifactual and related to incomplete opacification. No pathologic lymphadenopathy. Reproductive: Normal appearing atrophic uterus consistent with patient age. No adnexal masses. Other: None. Musculoskeletal: LEFT posterolateral tenth rib metastasis measuring approximately 3.8 x 1.9 cm, decreased in size since the prior PET-CT after radiation treatment. Mixed lytic and sclerotic metastasis involving the T10 vertebral body and the RIGHT pedicle of T10, more conspicuous than on the January, 2020 examination. No new osseous metastases elsewhere. IMPRESSION: 1. Acute diverticulitis involving the proximal sigmoid colon with evidence of an intramural abscess in the wall of the sigmoid colon. No evidence of perforation. 2. Enlarging necrotic left pericardiophrenic lymph node since the January, 2020 CT. 3. Stable sub-5 mm liver lesions which are too small to characterize by CT. 4. Possible bilateral iliofemoral DVT, though this may be artifactual and related to incomplete opacification. Does the patient have clinical symptoms of BILATERAL LOWER extremity edema?  If so, LOWER extremity venous Doppler ultrasound may be helpful to confirm a benign this finding. 5. Osseous metastasis involving the T10 vertebral body and the right pedicle of T10,  more conspicuous than in January, 2020. Interval decrease in size of a LEFT tenth rib metastasis after treatment with radiation therapy. No new osseous metastases. 6. BILATERAL nephrolithiasis. No obstructing ureteral calculi. Severe scarring involving both kidneys. 7.  Aortic Atherosclerosis, severe.  (ICD10-170.0) Electronically Signed   By: Evangeline Dakin M.D.   On: 02/23/2019 13:40    Procedures Procedures (including critical care time)  Medications Ordered in ED Medications  sodium chloride (PF) 0.9 % injection (  Not Given 02/23/19 1359)  potassium chloride 10 mEq in 100 mL IVPB (has no administration in time range)  0.9 %  sodium chloride infusion (has no administration in time range)  sodium chloride 0.9 % bolus 1,000 mL (0 mLs Intravenous Stopped 02/23/19 1323)  ondansetron (ZOFRAN) injection 4 mg (4 mg Intravenous Given 02/23/19 1207)  morphine 4 MG/ML injection 4 mg (4 mg Intravenous Given 02/23/19 1209)  iohexol (OMNIPAQUE) 300 MG/ML solution 100 mL (100 mLs Intravenous Contrast Given 02/23/19 1225)  piperacillin-tazobactam (ZOSYN) IVPB 3.375 g (3.375 g Intravenous New Bag/Given 02/23/19 1357)     Initial Impression / Assessment and Plan / ED Course  I have reviewed the triage vital signs and the nursing notes.  Pertinent labs & imaging results that were available during my care of the patient were reviewed by me and considered in my medical decision making (see chart for details).  Clinical Course as of Feb 22 1438  Sat Feb 23, 2019  1418 I spoke with Dr. Lucia Gaskins with general surgery who will evaluate the patient later on.  He recommends initiation of Zosyn, which I already ordered.  He recommends hospitalist admission.   [AL]    Clinical Course User Index [AL] Frederica Kuster, PA-C        Patient presenting with a 5-day history of diarrhea, nausea, and vomiting.  CT abdomen pelvis today shows acute diverticulitis involving the proximal sigmoid colon with evidence of intramural abscess in the wall of the sigmoid colon without evidence of perforation; CT also shows enlarging necrotic left pericardiophrenic lymph node as well as possible bilateral iliofemoral DVT.  Bilateral Doppler ultrasounds will be ordered to confirm this diagnosis, as it may be artifactual.  Zosyn initiated for diverticulitis and abscess.  I discussed patient case with general surgeon, Dr. Lucia Gaskins, who will evaluate the patient and agrees with Zosyn.  I spoke with Dr. Karleen Hampshire with [facility].  She will admit patient for further management.  I appreciate the above consultants for their assistance with the patient.  Patient also evaluated by my attending, Dr. Sherry Ruffing, who guided the patient's management and agrees with plan.  Final Clinical Impressions(s) / ED Diagnoses   Final diagnoses:  Diverticulitis of large intestine with abscess without bleeding  Nausea vomiting and diarrhea    ED Discharge Orders    None       Frederica Kuster, PA-C 02/23/19 1440    Tegeler, Gwenyth Allegra, MD 02/24/19 0700

## 2019-02-24 DIAGNOSIS — C7A8 Other malignant neuroendocrine tumors: Secondary | ICD-10-CM | POA: Diagnosis present

## 2019-02-24 DIAGNOSIS — I8290 Acute embolism and thrombosis of unspecified vein: Secondary | ICD-10-CM | POA: Diagnosis present

## 2019-02-24 DIAGNOSIS — C7951 Secondary malignant neoplasm of bone: Secondary | ICD-10-CM

## 2019-02-24 DIAGNOSIS — K5732 Diverticulitis of large intestine without perforation or abscess without bleeding: Secondary | ICD-10-CM

## 2019-02-24 DIAGNOSIS — E876 Hypokalemia: Secondary | ICD-10-CM | POA: Diagnosis present

## 2019-02-24 DIAGNOSIS — D709 Neutropenia, unspecified: Secondary | ICD-10-CM | POA: Insufficient documentation

## 2019-02-24 LAB — CBC WITH DIFFERENTIAL/PLATELET
Abs Immature Granulocytes: 0.01 10*3/uL (ref 0.00–0.07)
Basophils Absolute: 0 10*3/uL (ref 0.0–0.1)
Basophils Relative: 1 %
Eosinophils Absolute: 0 10*3/uL (ref 0.0–0.5)
Eosinophils Relative: 1 %
HCT: 26.6 % — ABNORMAL LOW (ref 36.0–46.0)
Hemoglobin: 8.6 g/dL — ABNORMAL LOW (ref 12.0–15.0)
Immature Granulocytes: 1 %
Lymphocytes Relative: 13 %
Lymphs Abs: 0.3 10*3/uL — ABNORMAL LOW (ref 0.7–4.0)
MCH: 33.6 pg (ref 26.0–34.0)
MCHC: 32.3 g/dL (ref 30.0–36.0)
MCV: 103.9 fL — ABNORMAL HIGH (ref 80.0–100.0)
Monocytes Absolute: 0.2 10*3/uL (ref 0.1–1.0)
Monocytes Relative: 8 %
Neutro Abs: 1.5 10*3/uL — ABNORMAL LOW (ref 1.7–7.7)
Neutrophils Relative %: 76 %
Platelets: 197 10*3/uL (ref 150–400)
RBC: 2.56 MIL/uL — ABNORMAL LOW (ref 3.87–5.11)
RDW: 17.6 % — ABNORMAL HIGH (ref 11.5–15.5)
WBC: 2 10*3/uL — ABNORMAL LOW (ref 4.0–10.5)
nRBC: 0 % (ref 0.0–0.2)

## 2019-02-24 LAB — BASIC METABOLIC PANEL
Anion gap: 10 (ref 5–15)
BUN: 16 mg/dL (ref 8–23)
CO2: 20 mmol/L — ABNORMAL LOW (ref 22–32)
Calcium: 7.9 mg/dL — ABNORMAL LOW (ref 8.9–10.3)
Chloride: 106 mmol/L (ref 98–111)
Creatinine, Ser: 0.7 mg/dL (ref 0.44–1.00)
GFR calc Af Amer: >60 mL/min (ref >60)
GFR calc non Af Amer: >60 mL/min (ref >60)
Glucose, Bld: 113 mg/dL — ABNORMAL HIGH (ref 70–99)
Potassium: 3.1 mmol/L — ABNORMAL LOW (ref 3.5–5.1)
Sodium: 136 mmol/L (ref 135–145)

## 2019-02-24 LAB — HIV ANTIBODY (ROUTINE TESTING W REFLEX): HIV Screen 4th Generation wRfx: NONREACTIVE

## 2019-02-24 MED ORDER — POTASSIUM CHLORIDE 10 MEQ/100ML IV SOLN
10.0000 meq | INTRAVENOUS | Status: AC
  Administered 2019-02-24 (×4): 10 meq via INTRAVENOUS
  Filled 2019-02-24 (×4): qty 100

## 2019-02-24 MED ORDER — POTASSIUM CHLORIDE 20 MEQ PO PACK
40.0000 meq | PACK | Freq: Once | ORAL | Status: DC
  Filled 2019-02-24: qty 2

## 2019-02-24 MED ORDER — OXYCODONE HCL 5 MG PO TABS
5.0000 mg | ORAL_TABLET | ORAL | Status: DC | PRN
  Filled 2019-02-24: qty 1

## 2019-02-24 MED ORDER — PANTOPRAZOLE SODIUM 40 MG PO TBEC
40.0000 mg | DELAYED_RELEASE_TABLET | Freq: Every day | ORAL | Status: DC
  Administered 2019-02-26 – 2019-02-27 (×2): 40 mg via ORAL
  Filled 2019-02-24 (×2): qty 1

## 2019-02-24 MED ORDER — DEXTROSE IN LACTATED RINGERS 5 % IV SOLN
INTRAVENOUS | Status: DC
  Administered 2019-02-24 – 2019-02-26 (×2): via INTRAVENOUS

## 2019-02-24 NOTE — Progress Notes (Addendum)
PROGRESS NOTE    Alison Sullivan  DZH:[id number] DOB: [birth year] DOA: 02/23/2019 PCP: [name], MD    Brief Narrative:  68 year old female who presented with diarrhea, nausea, vomiting and abdominal pain for the last 5 days.  She does have the significant past medical history for stage IV, high-grade neuroendocrine carcinoma, with bony and lymphatic metastasis, on radiation and chemotherapy.  Reported 5 days of abdominal pain, moderate to severe in intensity.  On her initial physical examination her blood pressure was 83/59, heart rate 76, respirate 22, oxygen saturation 97%, she had dry mucous membranes, lungs had decreased breath sounds at bases, heart S1-S2 present rhythmic, the abdomen was tender in the left lower quadrant, no lower extremity edema.  Sodium was 134, potassium 3.1, chloride 104, bicarb 21, glucose 116, BUN 19, creatinine 0.61, white count 4.2, hemoglobin 9.6, hematocrit 28.8, platelets 235.  CT of the abdomen and pelvis showed acute diverticulitis involving the proximal sigmoid colon with evidence of intramural abscess in the wall of the sigmoid colon.  No evidence of perforation.  EKG 80 bpm, normal intervals, normal axis, sinus rhythm with normal conduction, ST segment depressions with T wave inversions in V2 through V5.   Patient was admitted to the hospital with a working diagnosis of acute diverticulitis complicated by hypokalemia   Assessment & Plan:   Principal Problem:   Diverticulitis of sigmoid colon Active Problems:   Bone metastases ([place])   Abscess of sigmoid colon due to diverticulitis   Neuroendocrine carcinoma of the left lung    Hypokalemia   Thrombus port  A cath   1. Acute proximal sigmoid diverticulitis with intraluminal abscess. Will continue supportive medical therapy with IV fluids (dextrose with balanced electrolyte solutions), IV antibiotic therapy with IV Zosyn, as needed IV analgesics and antiemetics. No surgical indication per Dr.  Johney Maine. Will continue to keep patient npo for now with sips and meds.   2. Metastatic neuroendocrine carcinoma of the left lung. Patient under current treatment with chemotherapy and radiation therapy. No neutropenia or fever.   3. Hypokalemia and Hyponatremia. Continue K correction with po and IV Kcl, total of 80 meq today, follow on renal panel in am. Renal function is preserved with serum cr at 0.70 and serum bicarbonate at 20.   4. Anemia due to malignancy. Hgb at 8,6 with Hct at 26.6, will continue close follow up of cell counts, no current indication for PRBC transfusion.    5. GERD. Continue pantoprazole.   6. Non occlusive thrombus of the Port A cath.  Continue holding warfarin for now, due to possible imminent surgery. INR 1.7 today. Patient is on therapeutic doses of enoxaparin.   DVT prophylaxis: scd   Code Status:  full Family Communication: no family at the bedside  Disposition Plan/ discharge barriers: pending clinical improvement.   There is no height or weight on file to calculate BMI. Malnutrition Type:      Malnutrition Characteristics:      Nutrition Interventions:     RN Pressure Injury Documentation:     Consultants:   Surgery   Procedures:     Antimicrobials:       Subjective: Patient is feeling better but continue to have abdominal pain, not back to baseline, no nausea or vomiting. No diarrhea this am. Positive general weakness.   Objective: Vitals:   02/23/19 1500 02/23/19 1627 02/23/19 2033 02/24/19 0434  BP: 122/71 (!) 104/54 109/71 (!) 100/59  Pulse: 77 (!) 59 81 81  Resp: 16  14 16  Temp:  98.4 F (36.9 C) 98.5 F (36.9 C) 98.8 F (37.1 C)  TempSrc:  Oral Oral Oral  SpO2: 96% 98% 97% 96%   No intake or output data in the 24 hours ending 02/24/19 1027 There were no vitals filed for this visit.  Examination:   General: deconditioned and ill looking appearing  Neurology: Awake and alert, non focal  E ENT: mild pallor, no  icterus, oral mucosa dry.  Cardiovascular: No JVD. S1-S2 present, rhythmic, no gallops, rubs, or murmurs. Trace lower extremity edema. Pulmonary: positive breath sounds bilaterally, adequate air movement, no wheezing, rhonchi or rales. Gastrointestinal. Abdomen mild distended with no organomegaly, positive tenderness on the left lower quadrant, with no rebound or guarding Skin. No rashes Musculoskeletal: no joint deformities     Data Reviewed: I have personally reviewed following labs and imaging studies  CBC: Recent Labs  Lab 02/18/19 1002 02/22/19 1450 02/23/19 1212 02/24/19 0409  WBC 5.3 6.5 4.2 2.0*  NEUTROABS 3.7 6.1 3.9 1.5*  HGB 9.9* 11.3* 9.6* 8.6*  HCT 30.9* 33.7* 28.8* 26.6*  MCV 104.4* 100.3* 101.8* 103.9*  PLT 242 317 235 109   Basic Metabolic Panel: Recent Labs  Lab 02/18/19 1002 02/22/19 1450 02/23/19 1212 02/24/19 0409  NA 141 139 134* 136  K 3.3* 3.9 3.1* 3.1*  CL 104 103 104 106  CO2 26 20* 21* 20*  GLUCOSE 130* 111* 116* 113*  BUN 12 20 19 16   CREATININE 0.84 0.79 0.61 0.70  CALCIUM 8.5* 9.0 8.1* 7.9*  MG  --  1.8  --   --    GFR: Estimated Creatinine Clearance: 63.9 mL/min (by C-G formula based on SCr of 0.7 mg/dL). Liver Function Tests: Recent Labs  Lab 02/18/19 1002 02/22/19 1450 02/23/19 1212  AST 21 21 23   ALT 16 14 16   ALKPHOS 96 106 80  BILITOT 0.4 0.7 0.9  PROT 6.8 7.6 6.7  ALBUMIN 3.2* 3.4* 3.2*   Recent Labs  Lab 02/23/19 1212  LIPASE 25   No results for input(s): AMMONIA in the last 168 hours. Coagulation Profile: Recent Labs  Lab 02/18/19 1001 02/23/19 1212  INR 4.4* 1.7*   Cardiac Enzymes: Recent Labs  Lab 02/23/19 1213  TROPONINI <0.03   BNP (last 3 results) No results for input(s): PROBNP in the last 8760 hours. HbA1C: No results for input(s): HGBA1C in the last 72 hours. CBG: No results for input(s): GLUCAP in the last 168 hours. Lipid Profile: No results for input(s): CHOL, HDL, LDLCALC, TRIG,  CHOLHDL, LDLDIRECT in the last 72 hours. Thyroid Function Tests: No results for input(s): TSH, T4TOTAL, FREET4, T3FREE, THYROIDAB in the last 72 hours. Anemia Panel: No results for input(s): VITAMINB12, FOLATE, FERRITIN, TIBC, IRON, RETICCTPCT in the last 72 hours.    Radiology Studies: I have reviewed all of the imaging during this hospital visit personally     Scheduled Meds: . enoxaparin (LOVENOX) injection  1 mg/kg Subcutaneous Q12H  . gabapentin  300 mg Oral TID  . Melatonin  10 mg Oral QHS  . pantoprazole (PROTONIX) IV  40 mg Intravenous Q24H  . scopolamine  1 patch Transdermal Q72H   Continuous Infusions: . dextrose 5 % and 0.45 % NaCl with KCl 20 mEq/L 125 mL/hr at 02/23/19 2228  . piperacillin-tazobactam (ZOSYN)  IV 3.375 g (02/24/19 0500)     LOS: 1 day        Aldora Perman Gerome Apley, MD \

## 2019-02-24 NOTE — Progress Notes (Signed)
Alison Sullivan [id number] [birth year]  CARE TEAM:  PCP: [name], MD  Outpatient Care Team: Patient Care Team: [name], MD as PCP - General (Internal Medicine)  Inpatient Treatment Team: Treatment Team: Attending Provider: Tawni Millers, MD; Consulting Physician: Alphonsa Overall, MD; Consulting Physician: Edison Pace, Md, MD; Consulting Physician: Curt Bears, MD; Registered Nurse: Aldean Ast, RN; Rounding Team: Ian Bushman, MD   Problem List:   Principal Problem:   Diverticulitis of sigmoid colon Active Problems:   Neuroendocrine carcinoma of the left lung    Nephrolithiasis   Cigarette smoker   Bone metastases ([place])   Long term current use of anticoagulant therapy   Abscess of sigmoid colon due to diverticulitis   Neutropenia ([place])      * No surgery found *      Assessment  Stabilizing with sigmoid diverticulitis and intramural wall small abscess (less than 3 cm)  [facility] Stay = 1 days)  Plan:  1.  Diverticulitis             NPO (okay for ice chips), IVF until pain abates and has more consistent bowel function  IV antibiotics.  I would do Zosyn given the fact that she is immunosuppressed on chemotherapy and has an abscess  If does not improve or worsens, repeat CT scan to see if there is an abscess that can be drained.  Try and hold off on any surgery as needed for [facility] resection with temporary colostomy would be imminent  2.  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              Follow by Dr. Earlie Server.   On chemotx now.              [facility] oncology in her decision planning -defer to medical primary service              To see Dr. Durenda Hurt at [facility] for second opinion regarding her condition   3.  Mets to left rib  and T10             Rad tx by Dr. Isidore Moos - finished left rib RTx on 11/06/2018.  Finished T10 RTx on 02/08/2019 Definite part of the pain. 4.  History of UTI and ? pyelonephritis             Seen by Dr. Junious Silk 5.  Anticoagulated on Coumadin For thrombosis involving her porta cath.  Agree with holding oral anticoagulation for now in case interventional radiology or surgery needed              Now placed on Lovenox 6.  History of kidney stones 7.  Smoked until last fall 8.  Severe atherosclerosis by CT scan   20 minutes spent in review, evaluation, examination, counseling, and coordination of care.  More than 50% of that time was spent in counseling.  02/24/2019    Subjective: (Chief complaint)  Pain not too severe.  Some loose bowel movements.  Placed on enteric precautions  Nursing just outside room.  No major events  Objective:  Vital signs:  Vitals:   02/23/19 1500 02/23/19 1627 02/23/19 2033 02/24/19 0434  BP: 122/71 (!) 104/54 109/71 (!) 100/59  Pulse: 77 (!) 59 81 81  Resp: 16  14 16  Temp:  98.4 F (36.9 C) 98.5 F (36.9 C) 98.8 F (37.1 C)  TempSrc:  Oral Oral Oral  SpO2: 96% 98% 97% 96%    Last BM Date: 02/23/19  Intake/Output   Yesterday:  No intake/output data recorded. This shift:  No intake/output data recorded.  Bowel function:  Flatus: YES  BM:  YES  Drain: (No drain)   Physical Exam:  General: Pt awake/alert/oriented x4 in mild acute distress Eyes: PERRL, normal EOM.  Sclera clear.  No icterus Neuro: CN II-XII intact w/o focal sensory/motor deficits. Lymph: No head/neck/groin lymphadenopathy Psych:  No delerium/psychosis/paranoia HENT: Normocephalic, Mucus membranes moist.  No thrush Neck: Supple, No tracheal deviation Chest: Some chest wall pain w good excursion CV:  Pulses intact.  Regular rhythm MS: Normal AROM mjr joints.  No obvious deformity  Abdomen: Soft.  Mildy distended.  Tenderness at LLQ only.  No major guarding or  strong peritonitis.  No evidence of peritonitis.  No incarcerated hernias.  Ext:  No deformity.  No mjr edema.  No cyanosis Skin: No petechiae / purpura  Results:   Labs: Results for orders placed or performed during the hospital encounter of 02/23/19 (from the past 48 hour(s))  CBC with Differential     Status: Abnormal   Collection Time: 02/23/19 12:12 PM  Result Value Ref Range   WBC 4.2 4.0 - 10.5 K/uL   RBC 2.83 (L) 3.87 - 5.11 MIL/uL   Hemoglobin 9.6 (L) 12.0 - 15.0 g/dL   HCT 28.8 (L) 36.0 - 46.0 %   MCV 101.8 (H) 80.0 - 100.0 fL   MCH 33.9 26.0 - 34.0 pg   MCHC 33.3 30.0 - 36.0 g/dL   RDW 17.5 (H) 11.5 - 15.5 %   Platelets 235 150 - 400 K/uL   nRBC 0.0 0.0 - 0.2 %   Neutrophils Relative % 92 %   Neutro Abs 3.9 1.7 - 7.7 K/uL   Lymphocytes Relative 4 %   Lymphs Abs 0.2 (L) 0.7 - 4.0 K/uL   Monocytes Relative 3 %   Monocytes Absolute 0.1 0.1 - 1.0 K/uL   Eosinophils Relative 0 %   Eosinophils Absolute 0.0 0.0 - 0.5 K/uL   Basophils Relative 0 %   Basophils Absolute 0.0 0.0 - 0.1 K/uL   RBC Morphology MORPHOLOGY UNREMARKABLE    Immature Granulocytes 1 %   Abs Immature Granulocytes 0.02 0.00 - 0.07 K/uL    Comment: Performed at [facility], [place] [address] [place], [place] [address]  Lipase, blood     Status: None   Collection Time: 02/23/19 12:12 PM  Result Value Ref Range   Lipase 25 11 - 51 U/L    Comment: Performed at [facility], [place] [address] [place], [place] [address]  Comprehensive metabolic panel     Status: Abnormal   Collection Time: 02/23/19 12:12 PM  Result Value Ref Range   Sodium 134 (L) 135 - 145 mmol/L   Potassium 3.1 (L) 3.5 - 5.1 mmol/L   Chloride 104 98 - 111 mmol/L   CO2 21 (L) 22 - 32 mmol/L   Glucose, Bld 116 (H) 70 - 99 mg/dL   BUN 19 8 - 23 mg/dL   Creatinine, Ser 0.61 0.44 - 1.00 mg/dL   Calcium 8.1 (L) 8.9 - 10.3 mg/dL   Total Protein 6.7 6.5 - 8.1 g/dL   Albumin 3.2 (L) 3.5 - 5.0 g/dL   AST  23 15 - 41 U/L  ALT 16 0 - 44 U/L   Alkaline Phosphatase 80 38 - 126 U/L   Total Bilirubin 0.9 0.3 - 1.2 mg/dL   GFR calc non Af Amer >60 >60 mL/min   GFR calc Af Amer >60 >60 mL/min   Anion gap 9 5 - 15    Comment: Performed at [facility], [place] [address], [place], [place] [address]  Protime-INR     Status: Abnormal   Collection Time: 02/23/19 12:12 PM  Result Value Ref Range   Prothrombin Time 19.5 (H) 11.4 - 15.2 seconds   INR 1.7 (H) 0.8 - 1.2    Comment: (NOTE) INR goal varies based on device and disease states. Performed at [facility], [place] [address] [place], [place] [address]   Troponin I - Once     Status: None   Collection Time: 02/23/19 12:13 PM  Result Value Ref Range   Troponin I <0.03 <0.03 ng/mL    Comment: Performed at [facility], [place] [address], [place], [place] [address]  Lactic acid, plasma     Status: None   Collection Time: 02/23/19  3:57 PM  Result Value Ref Range   Lactic Acid, Venous 0.8 0.5 - 1.9 mmol/L    Comment: Performed at [facility], [place] [address], [place], [place] [address]  CBC with Differential/Platelet     Status: Abnormal   Collection Time: 02/24/19  4:09 AM  Result Value Ref Range   WBC 2.0 (L) 4.0 - 10.5 K/uL   RBC 2.56 (L) 3.87 - 5.11 MIL/uL   Hemoglobin 8.6 (L) 12.0 - 15.0 g/dL   HCT 26.6 (L) 36.0 - 46.0 %   MCV 103.9 (H) 80.0 - 100.0 fL   MCH 33.6 26.0 - 34.0 pg   MCHC 32.3 30.0 - 36.0 g/dL   RDW 17.6 (H) 11.5 - 15.5 %   Platelets 197 150 - 400 K/uL   nRBC 0.0 0.0 - 0.2 %   Neutrophils Relative % 76 %   Neutro Abs 1.5 (L) 1.7 - 7.7 K/uL   Lymphocytes Relative 13 %   Lymphs Abs 0.3 (L) 0.7 - 4.0 K/uL   Monocytes Relative 8 %   Monocytes Absolute 0.2 0.1 - 1.0 K/uL   Eosinophils Relative 1 %   Eosinophils Absolute 0.0 0.0 - 0.5 K/uL   Basophils Relative 1 %   Basophils Absolute 0.0 0.0 - 0.1 K/uL   Immature Granulocytes 1 %   Abs Immature  Granulocytes 0.01 0.00 - 0.07 K/uL    Comment: Performed at [facility], [place] [address], [place], [place] [address]  Basic metabolic panel     Status: Abnormal   Collection Time: 02/24/19  4:09 AM  Result Value Ref Range   Sodium 136 135 - 145 mmol/L   Potassium 3.1 (L) 3.5 - 5.1 mmol/L   Chloride 106 98 - 111 mmol/L   CO2 20 (L) 22 - 32 mmol/L   Glucose, Bld 113 (H) 70 - 99 mg/dL   BUN 16 8 - 23 mg/dL   Creatinine, Ser 0.70 0.44 - 1.00 mg/dL   Calcium 7.9 (L) 8.9 - 10.3 mg/dL   GFR calc non Af Amer >60 >60 mL/min   GFR calc Af Amer >60 >60 mL/min   Anion gap 10 5 - 15    Comment: Performed at [facility], [place] [address], [place], [place] [address]    Imaging / Studies: Ct Abdomen Pelvis W Contrast  Result Date: 02/23/2019 CLINICAL DATA:  68 year old presenting with acute onset of LEFT LOWER abdominal pain and nausea/vomiting that began yesterday. Current history of urinary tract calculi. 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 EXAM: CT ABDOMEN AND PELVIS WITH CONTRAST TECHNIQUE: Multidetector CT imaging of the abdomen and pelvis was performed using the standard protocol following bolus administration of intravenous contrast. CONTRAST:  141mL OMNIPAQUE IOHEXOL 300 MG/ML IV. COMPARISON:  02/15/2019 and earlier, including PET-CT 09/18/2018. FINDINGS: Lower chest: Visualized lung bases clear apart from the expected dependent atelectasis posteriorly in the lower lobes. Stable mild BILATERAL lower lobe bronchiectasis. Stable normal heart size. Necrotic LEFT pericardiophrenic lymph node measuring approximately 2.6 x 3.3 x 3.6 cm, slightly increased in size since the January, 2020 CT. Hepatobiliary: Multiple sub-5 mm low-attenuation lesions  throughout both lobes of the liver, unchanged since the January, 2020 examination. No new or enlarging liver lesions. Gallbladder normal in appearance without calcified gallstones. No biliary ductal dilation. Pancreas: Normal in appearance without evidence of mass, ductal dilation, or inflammation. Spleen: Normal in size and appearance. Adrenals/Urinary Tract: Normal appearing adrenal glands. Severe scarring involving both kidneys. BILATERAL nephrolithiasis, the largest stone measuring approximately 1 cm in a LOWER pole calyx of the RIGHT kidney. No obstructing ureteral calculi on either side. Cortical cysts and calyceal diverticula involving both kidneys. No convincing solid masses involving either kidney. Large extrarenal pelvis on the LEFT and small extrarenal pelvis on the RIGHT. Normal appearing decompressed urinary bladder. Stomach/Bowel: Stomach normal in appearance for the degree of distention. Normal-appearing small bowel. Entire colon relatively decompressed. Mild wall thickening diffusely throughout the decompressed colon. Distal descending and sigmoid colon diverticulosis without evidence of acute diverticulitis involving the proximal sigmoid colon in the LEFT side of the mid pelvis. Complex fluid collection in this location which is likely intramural. No evidence of extraluminal gas. Appendix not clearly demonstrated, but no pericecal inflammation. Vascular/Lymphatic: Severe aortoiliofemoral atherosclerosis with calcified and noncalcified plaque involving the abdominal aorta. Possible filling defects involving the BILATERAL iliofemoral veins, though this may be artifactual and related to incomplete opacification. No pathologic lymphadenopathy. Reproductive: Normal appearing atrophic uterus consistent with patient age. No adnexal masses. Other: None. Musculoskeletal: LEFT posterolateral tenth rib metastasis measuring approximately 3.8 x 1.9 cm, decreased in size since the prior PET-CT after radiation  treatment. Mixed lytic and sclerotic metastasis involving the T10 vertebral body and the RIGHT pedicle of T10, more conspicuous than on the January, 2020 examination. No new osseous metastases elsewhere. IMPRESSION: 1. Acute diverticulitis involving the proximal sigmoid colon with evidence of an intramural abscess in the wall of the sigmoid colon. No evidence of perforation. 2. Enlarging necrotic left pericardiophrenic lymph node since the January, 2020 CT. 3. Stable sub-5 mm liver lesions which are too small to characterize by CT. 4. Possible bilateral iliofemoral DVT, though this may be artifactual and related to incomplete opacification. Does the patient have clinical symptoms of BILATERAL LOWER extremity edema? If so, LOWER extremity venous Doppler ultrasound may be helpful to confirm a benign this finding. 5. Osseous metastasis involving the T10 vertebral body and the right pedicle of T10, more conspicuous than in January, 2020. Interval decrease in size of a LEFT tenth rib metastasis after treatment with radiation therapy. No new osseous metastases. 6. BILATERAL nephrolithiasis. No obstructing ureteral calculi. Severe scarring involving both kidneys. 7.  Aortic Atherosclerosis, severe.  (ICD10-170.0) Electronically Signed   By: Evangeline Dakin M.D.   On: 02/23/2019 13:40   Vas [place] Lower Extremity Venous (dvt) (only Mc & Wl 7a-7p)  Result Date: 02/23/2019  Lower Venous Study Indications: Possible iliac thrombosis vs. artifact by CT. No lower extremity edema.  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 Presenting with acute diverticulitis involving the proximal sigmoid colon with evidence of intramural abscess in the wall of the sigmoid colon. Comparison Study: No prior study on file for comparison. Performing Technologist: Sharion Dove RVS  Examination Guidelines: A complete evaluation includes B-mode imaging, spectral Doppler, color Doppler, and power Doppler as needed  of all accessible portions of each vessel. Bilateral testing is considered an integral part of a complete examination. Limited examinations for reoccurring indications may be performed as noted.  Right Venous Findings: +---------+---------------+---------+-----------+----------+-------+            Compressibility Phasicity Spontaneity Properties Summary  +---------+---------------+---------+-----------+----------+-------+  CFV       Full            Yes       Yes                             +---------+---------------+---------+-----------+----------+-------+  SFJ       Full                                                      +---------+---------------+---------+-----------+----------+-------+  FV Prox   Full                                                      +---------+---------------+---------+-----------+----------+-------+  FV Mid    Full                                                      +---------+---------------+---------+-----------+----------+-------+  FV Distal Full                                                      +---------+---------------+---------+-----------+----------+-------+  PFV       Full                                                      +---------+---------------+---------+-----------+----------+-------+  POP       Full            Yes       Yes                             +---------+---------------+---------+-----------+----------+-------+  PTV       Full                                                      +---------+---------------+---------+-----------+----------+-------+  PERO      Full                                                      +---------+---------------+---------+-----------+----------+-------+  Left Venous Findings: +---------+---------------+---------+-----------+----------+-------+            Compressibility Phasicity Spontaneity Properties Summary  +---------+---------------+---------+-----------+----------+-------+  CFV       Full            Yes       Yes                              +---------+---------------+---------+-----------+----------+-------+  SFJ       Full                                                      +---------+---------------+---------+-----------+----------+-------+  FV Prox   Full                                                      +---------+---------------+---------+-----------+----------+-------+  FV Mid    Full                                                      +---------+---------------+---------+-----------+----------+-------+  FV Distal Full                                                      +---------+---------------+---------+-----------+----------+-------+  PFV       Full                                                      +---------+---------------+---------+-----------+----------+-------+  POP       Full            Yes       Yes                             +---------+---------------+---------+-----------+----------+-------+  PTV       Full                                                      +---------+---------------+---------+-----------+----------+-------+  PERO      Full                                                      +---------+---------------+---------+-----------+----------+-------+  Summary: Right: There is no evidence of deep vein thrombosis in the lower extremity. Left: There is no evidence of deep vein thrombosis in the lower extremity.  *See table(s) above for measurements and observations.    Preliminary     Medications / Allergies: per chart  Antibiotics: Anti-infectives (From admission, onward)   Start     Dose/Rate Route Frequency Ordered Stop   02/23/19 2000  piperacillin-tazobactam (ZOSYN) IVPB 3.375 g     3.375 g 12.5 mL/hr over 240 Minutes Intravenous Every 8 hours 02/23/19 1608     02/23/19 1400  piperacillin-tazobactam (ZOSYN) IVPB 3.375 g     3.375 g 100 mL/hr over 30 Minutes Intravenous  Once 02/23/19 1347 02/23/19 1427        Note: Portions of this report may have been transcribed using  voice recognition software. Every effort was made to ensure accuracy; however, inadvertent computerized transcription errors may be present.   Any transcriptional errors that result from this process are unintentional.     Adin Hector, MD, FACS, MASCRS Gastrointestinal and Minimally Invasive Surgery    [address], [place], [place] [address] [phone] Main / Paging [phone] Fax

## 2019-02-25 ENCOUNTER — Telehealth: Payer: Self-pay | Admitting: Medical Oncology

## 2019-02-25 ENCOUNTER — Encounter (HOSPITAL_COMMUNITY): Payer: Self-pay

## 2019-02-25 ENCOUNTER — Other Ambulatory Visit: Payer: Medicare Other

## 2019-02-25 ENCOUNTER — Inpatient Hospital Stay: Payer: Medicare Other

## 2019-02-25 DIAGNOSIS — T451X5A Adverse effect of antineoplastic and immunosuppressive drugs, initial encounter: Secondary | ICD-10-CM

## 2019-02-25 DIAGNOSIS — D701 Agranulocytosis secondary to cancer chemotherapy: Secondary | ICD-10-CM

## 2019-02-25 LAB — DIFFERENTIAL
Basophils Absolute: 0 10*3/uL (ref 0.0–0.1)
Basophils Relative: 1 %
Eosinophils Absolute: 0 10*3/uL (ref 0.0–0.5)
Eosinophils Relative: 2 %
Lymphocytes Relative: 24 %
Lymphs Abs: 0.3 10*3/uL — ABNORMAL LOW (ref 0.7–4.0)
Monocytes Absolute: 0.2 10*3/uL (ref 0.1–1.0)
Monocytes Relative: 12 %
Neutro Abs: 0.8 10*3/uL — ABNORMAL LOW (ref 1.7–7.7)
Neutrophils Relative %: 61 %

## 2019-02-25 LAB — BASIC METABOLIC PANEL
Anion gap: 9 (ref 5–15)
BUN: 11 mg/dL (ref 8–23)
CO2: 21 mmol/L — ABNORMAL LOW (ref 22–32)
Calcium: 8.1 mg/dL — ABNORMAL LOW (ref 8.9–10.3)
Chloride: 105 mmol/L (ref 98–111)
Creatinine, Ser: 0.59 mg/dL (ref 0.44–1.00)
GFR calc Af Amer: >60 mL/min (ref >60)
GFR calc non Af Amer: >60 mL/min (ref >60)
Glucose, Bld: 105 mg/dL — ABNORMAL HIGH (ref 70–99)
Potassium: 3 mmol/L — ABNORMAL LOW (ref 3.5–5.1)
Sodium: 135 mmol/L (ref 135–145)

## 2019-02-25 LAB — CBC
HCT: 25 % — ABNORMAL LOW (ref 36.0–46.0)
Hemoglobin: 8.1 g/dL — ABNORMAL LOW (ref 12.0–15.0)
MCH: 33.5 pg (ref 26.0–34.0)
MCHC: 32.4 g/dL (ref 30.0–36.0)
MCV: 103.3 fL — ABNORMAL HIGH (ref 80.0–100.0)
Platelets: 174 10*3/uL (ref 150–400)
RBC: 2.42 MIL/uL — ABNORMAL LOW (ref 3.87–5.11)
RDW: 17.3 % — ABNORMAL HIGH (ref 11.5–15.5)
WBC: 1.3 10*3/uL — CL (ref 4.0–10.5)
nRBC: 0 % (ref 0.0–0.2)

## 2019-02-25 LAB — TSH: TSH: 0.548 u[IU]/mL (ref 0.308–3.960)

## 2019-02-25 MED ORDER — CHLORHEXIDINE GLUCONATE 0.12 % MT SOLN
15.0000 mL | Freq: Two times a day (BID) | OROMUCOSAL | Status: DC
  Administered 2019-02-27: 10:00:00 15 mL via OROMUCOSAL
  Filled 2019-02-25 (×2): qty 15

## 2019-02-25 MED ORDER — ORAL CARE MOUTH RINSE: 15.0000 mL | Freq: Two times a day (BID) | OROMUCOSAL | Status: DC

## 2019-02-25 MED ORDER — TBO-FILGRASTIM 300 MCG/0.5ML ~~LOC~~ SOSY
300.0000 ug | PREFILLED_SYRINGE | Freq: Every day | SUBCUTANEOUS | Status: DC
  Administered 2019-02-25: 300 ug via SUBCUTANEOUS
  Filled 2019-02-25: qty 0.5

## 2019-02-25 MED ORDER — POTASSIUM CHLORIDE CRYS ER 20 MEQ PO TBCR: 40.0000 meq | EXTENDED_RELEASE_TABLET | ORAL | Status: AC

## 2019-02-25 NOTE — Progress Notes (Signed)
[facility] Progress Note   Alison Sullivan [id number] Jun 18, 1951 68 y.o.  [name] is managed by Dr. Fanny Bien. Alison Sullivan  Actively treated with chemotherapy/immunotherapy/hormonal therapy: yes  Current therapy: carboplatin, Alimta, and Keytruda  Last treated: 02/18/2019 (cycle 4, day 1)  Next scheduled appointment with provider: 03/11/2019  Assessment: Plan:    Nausea without vomiting - Plan: 0.9 %  sodium chloride infusion  Port-A-Cath in place - Plan: heparin lock flush 100 unit/mL, sodium chloride flush (NS) 0.9 % injection 10 mL  Large cell carcinoma of left lung, stage 4 (HCC) - Plan: heparin lock flush 100 unit/mL, sodium chloride flush (NS) 0.9 % injection 10 mL  Non-intractable vomiting with nausea, unspecified vomiting type - Plan: scopolamine (TRANSDERM-SCOP) 1 MG/3DAYS  Neoplasm related pain - Plan: morphine 4 MG/ML injection 1 mg   Nausea: The patient was given Zofran 4 mg IV and was given a prescription for scopolamine patches.  Stage IV large cell lymphoma of the lung: The patient is status post cycle 4, day 1 of carboplatin, Alimta, and Keytruda dosed on 02/18/2019.  She will see Dr. Julien Nordmann in follow-up on 03/11/2019.  Neoplasm related pain: The patient was given morphine 1 mg IV x1 today.  Please see After Visit Summary for patient specific instructions.  Future Appointments  Date Time Provider [place]  03/04/2019 12:15 PM [facility] None  03/11/2019  9:00 AM [facility] None  03/11/2019  9:45 AM Curt Bears, MD [facility] None  03/11/2019 10:30 AM [facility] INFUSION [facility] None  03/18/2019 12:15 PM [facility] LAB 3 [facility] None  03/25/2019 12:15 PM [facility] LAB 3 [facility] None  04/01/2019  8:30 AM [facility] LAB 4 [facility] None  04/01/2019  8:45 AM Curt Bears, MD [facility] None  04/01/2019  9:45 AM [facility] INFUSION [facility] None  04/09/2019 12:15  PM [facility] LAB 3 [facility] None  04/15/2019 12:15 PM [facility] LAB 3 [facility] None    No orders of the defined types were placed in this encounter.      Subjective:   Patient ID:  Alison Sullivan is a 69 y.o. (DOB Apr 23, 1951) female.  Chief Complaint:  Chief Complaint  Patient presents with   Nausea    HPI Alison Sullivan is a 68 year old female with a diagnosis of a stage IV large cell lymphoma of the lung who is followed by Dr. Julien Nordmann and is status post cycle 4, day 1 of carboplatin, Alimta, and Keytruda dosed on 02/18/2019.  She presents today with report that she has been having extreme nausea with all p.o. intake including her medications.  She states that she is not taking any of her medications this week.  She initially reported that she was hopeful that she could be admitted to the hospital for IV hydration for several days since she was not able to eat.  She reports understanding and agreement when she was told that the risk of contracting COVID-19 would be higher if she were hospitalized.  She is agreeable to receive IV hydration today plus new nausea medication and attempt to increase her intake of fluids on her own.  She lives by herself in [place] and has no immediate support close by.  Her son lives in [place].  Medications: I have reviewed the patient's current medications.  Allergies:  Allergies  Allergen Reactions   Contrast Media [Iodinated Diagnostic Agents] Diarrhea and Other (See Comments)    Stomach cramps - can take IV not oral liquid  Past Medical History:  Diagnosis Date   Arthritis    Chronic kidney disease    Dog bite of wrist    History of kidney stones    History of radiation therapy 10/23/18- 11/06/18   Left lower rib mass/ 30 Gy delivered in 10 fractions 3 Gy.    Kidney infection    Left renal mass    Osteopenia    Pyelonephritis    Recurrent UTI (urinary tract infection)     Past Surgical History:    Procedure Laterality Date   CYSTOSCOPY WITH RETROGRADE PYELOGRAM, URETEROSCOPY AND STENT PLACEMENT Left 12/28/2017   Procedure: CYSTOSCOPY WITH RETROGRADE PYELOGRAM, URETEROSCOPY AND STENT PLACEMENT, STONE BASKETRY;  Surgeon: Festus Aloe, MD;  Location: [facility];  Service: Urology;  Laterality: Left;   FINGER SURGERY     index, growth removal   HOLMIUM LASER APPLICATION Left 2/35/3614   Procedure: HOLMIUM LASER APPLICATION;  Surgeon: Festus Aloe, MD;  Location: [facility];  Service: Urology;  Laterality: Left;   IR IMAGING GUIDED PORT INSERTION  10/31/2018   KIDNEY STONE SURGERY  2011 or 2012   LITHOTRIPSY  2007    Family History  Problem Relation Age of Onset   Cancer Father        pancreatic   Melanoma Father     Social History   Socioeconomic History   Marital status: Single    Spouse name: Not on file   Number of children: Not on file   Years of education: Not on file   Highest education level: Not on file  Occupational History   Not on file  Social Needs   Financial resource strain: Not on file   Food insecurity:    Worry: Not on file    Inability: Not on file   Transportation needs:    Medical: No    Non-medical: No  Tobacco Use   Smoking status: Former Smoker    Packs/day: 0.25    Years: 30.00    Pack years: 7.50    Types: Cigarettes   Smokeless tobacco: Never Used   Tobacco comment: She quit early December 2019  Substance and Sexual Activity   Alcohol use: Yes    Comment: occ   Drug use: No   Sexual activity: Not on file  Lifestyle   Physical activity:    Days per week: Not on file    Minutes per session: Not on file   Stress: Not on file  Relationships   Social connections:    Talks on phone: Not on file    Gets together: Not on file    Attends religious service: Not on file    Active member of club or organization: Not on file    Attends meetings of clubs or organizations:  Not on file    Relationship status: Not on file   Intimate partner violence:    Fear of current or ex partner: No    Emotionally abused: No    Physically abused: No    Forced sexual activity: No  Other Topics Concern   Not on file  Social History Narrative   Not on file    Past Medical History, Surgical history, Social history, and Family history were reviewed and updated as appropriate.   Please see review of systems for further details on the patient's review from today.   Review of Systems:  Review of Systems  Constitutional: Positive for appetite change and fatigue. Negative for chills, diaphoresis and fever.  HENT: Negative for trouble swallowing.   Respiratory: Negative for cough, choking, shortness of breath and wheezing.   Cardiovascular: Negative for chest pain and palpitations.  Gastrointestinal: Positive for nausea. Negative for constipation, diarrhea and vomiting.  Genitourinary: Negative for decreased urine volume.  Neurological: Positive for weakness. Negative for headaches.    Objective:   Physical Exam:  BP 106/67 (BP Location: Left Arm, Patient Position: Sitting)    Pulse (!) 105    Temp 97.9 F (36.6 C) (Oral)    Resp 18    Ht 5\' 6"  (1.676 m)    Wt 145 lb (65.8 kg)    SpO2 100%    BMI 23.40 kg/m  ECOG: 1  Physical Exam Constitutional:      General: She is not in acute distress.    Appearance: She is not diaphoretic.  HENT:     Head: Normocephalic and atraumatic.  Cardiovascular:     Rate and Rhythm: Normal rate and regular rhythm.     Heart sounds: Normal heart sounds. No murmur. No friction rub. No gallop.   Pulmonary:     Effort: Pulmonary effort is normal. No respiratory distress.     Breath sounds: Normal breath sounds. No stridor. No wheezing or rales.  Abdominal:     General: Bowel sounds are normal. There is no distension.     Palpations: Abdomen is soft. There is no mass.     Tenderness: There is no abdominal tenderness. There is no  guarding or rebound.  Skin:    General: Skin is warm and dry.  Neurological:     Mental Status: She is alert.     Coordination: Coordination normal.     Lab Review:     Component Value Date/Time   NA 135 02/25/2019 0331   K 3.0 (L) 02/25/2019 0331   CL 105 02/25/2019 0331   CO2 21 (L) 02/25/2019 0331   GLUCOSE 105 (H) 02/25/2019 0331   BUN 11 02/25/2019 0331   CREATININE 0.59 02/25/2019 0331   CREATININE 0.79 02/22/2019 1450   CALCIUM 8.1 (L) 02/25/2019 0331   PROT 6.7 02/23/2019 1212   ALBUMIN 3.2 (L) 02/23/2019 1212   AST 23 02/23/2019 1212   AST 21 02/22/2019 1450   ALT 16 02/23/2019 1212   ALT 14 02/22/2019 1450   ALKPHOS 80 02/23/2019 1212   BILITOT 0.9 02/23/2019 1212   BILITOT 0.7 02/22/2019 1450   GFRNONAA >60 02/25/2019 0331   GFRNONAA >60 02/22/2019 1450   GFRAA >60 02/25/2019 0331   GFRAA >60 02/22/2019 1450       Component Value Date/Time   WBC 1.3 (LL) 02/25/2019 0331   RBC 2.42 (L) 02/25/2019 0331   HGB 8.1 (L) 02/25/2019 0331   HGB 11.3 (L) 02/22/2019 1450   HCT 25.0 (L) 02/25/2019 0331   PLT 174 02/25/2019 0331   PLT 317 02/22/2019 1450   MCV 103.3 (H) 02/25/2019 0331   MCH 33.5 02/25/2019 0331   MCHC 32.4 02/25/2019 0331   RDW 17.3 (H) 02/25/2019 0331   LYMPHSABS 0.3 (L) 02/25/2019 0331   MONOABS 0.2 02/25/2019 0331   EOSABS 0.0 02/25/2019 0331   BASOSABS 0.0 02/25/2019 0331   -------------------------------  Imaging from last 24 hours (if applicable):  Radiology interpretation: Ct Chest W Contrast  Result Date: 02/15/2019 CLINICAL DATA:  Stage IV left upper lobe high-grade neuroendocrine lung carcinoma with small and large cell features diagnosed November 2019. interval chemotherapy and immunotherapy. Restaging. Palliative radiation therapy to thoracic spine and left  rib lesions completed 02/08/2019. EXAM: CT CHEST, ABDOMEN, AND PELVIS WITH CONTRAST TECHNIQUE: Multidetector CT imaging of the chest, abdomen and pelvis was performed  following the standard protocol during bolus administration of intravenous contrast. CONTRAST:  155mL OMNIPAQUE IOHEXOL 300 MG/ML  SOLN COMPARISON:  12/03/2018 CT chest, abdomen and pelvis. FINDINGS: CT CHEST FINDINGS Cardiovascular: Normal heart size. No significant pericardial effusion/thickening. Right internal jugular Port-A-Cath terminates in the lower third of the SVC. Atherosclerotic nonaneurysmal thoracic aorta. Normal caliber pulmonary arteries. No central pulmonary emboli. Mediastinum/Nodes: No discrete thyroid nodules. Unremarkable esophagus. No axillary adenopathy. Bulky 3.6 x 2.9 cm left pericardiophrenic mass (series 2/image 40), increased from 2.9 x 2.3 cm. Enlarged 1.9 cm short axis left prevascular node (series 2/image 17), increased from 1.4 cm using similar measurement technique. No hilar adenopathy. Lungs/Pleura: No pneumothorax. No right pleural effusion. New trace dependent left pleural effusion. Far inferior plaque-like posterior left pleural lesion measures 0.7 cm thickness, previously 0.8 cm, not appreciably changed (series 7/image 15). Subsolid 4.1 x 2.4 cm medial superior segment right lower lobe mass (series 6/image 51), previously 4.1 x 2.4 cm using similar measurement technique, stable. Thick irregular bandlike focus of consolidation with surrounding patchy ground-glass opacity in the right upper lobe is unchanged. Solid 2.1 x 1.7 cm left upper lobe pulmonary nodule (series 6/image 53), previously 2.4 x 1.7 cm, slightly decreased. No new significant pulmonary nodules. No acute consolidative airspace disease. Musculoskeletal: Left posterior tenth rib expansile lesion measures 2.2 cm thickness, mildly increased from 1.8 cm, with associated healed pathologic fracture. Increased sclerosis within the sclerotic ill-defined T10 vertebral body lesion extending into the right pedicle. No new focal osseous lesions in the chest. Minimal thoracic spondylosis. CT ABDOMEN PELVIS FINDINGS  Hepatobiliary: Normal liver size. Several tiny subcentimeter hypodense lesions scattered throughout the liver, too small to characterize, unchanged. No new liver lesions. Normal gallbladder with no radiopaque cholelithiasis. No biliary ductal dilatation. Pancreas: Normal, with no mass or duct dilation. Spleen: Normal size. No mass. Adrenals/Urinary Tract: Normal adrenals. New 3 mm stone at the right ureterovesical junction. Minimal right hydroureteronephrosis, minimally increased from prior. Increased urothelial wall thickening and enhancement throughout the right renal collecting system and right ureter. Several nonobstructing stones in the kidneys bilaterally, largest 8 mm in the lower right kidney and 5 mm in the lower left kidney. No overt left hydronephrosis. Hypodense 2.4 cm lateral interpolar right renal cortical lesion is stable in size. Numerous subcentimeter hypodense renal cortical lesions throughout both kidneys are too small to characterize and are not appreciably changed. Nondistended bladder with suggestion of new mild bladder wall thickening and mucosal hyperenhancement. Stomach/Bowel: Normal non-distended stomach. Normal caliber small bowel with no small bowel wall thickening. Appendix not discretely visualized. Marked sigmoid diverticulosis. Similar chronic mild wall thickening in the sigmoid colon without acute pericolonic fat stranding. Vascular/Lymphatic: Atherosclerotic nonaneurysmal abdominal aorta. Patent portal, splenic, hepatic and renal veins. No pathologically enlarged lymph nodes in the abdomen or pelvis. Reproductive: Grossly normal uterus. Stable simple benign appearing 1.2 cm right adnexal cyst. No left adnexal mass. Other: No pneumoperitoneum, ascites or focal fluid collection. Musculoskeletal: No aggressive appearing focal osseous lesions. Moderate lower lumbar spondylosis. IMPRESSION: 1. New 3 mm stone at the right ureterovesical junction, with minimal right hydroureteronephrosis,  slightly increased from prior. 2. New mild diffuse bladder wall thickening and mucosal hyperenhancement. New urothelial wall thickening and hyperenhancement throughout the right renal collecting system and right ureter. Findings suggest acute cystitis with ascending right urinary tract infection. Suggest correlation with urinalysis. 3.  Bulky left pericardiophrenic and left prevascular mediastinal nodal metastases have increased. 4. New trace dependent left pleural effusion. 5. Left upper lobe solid pulmonary nodule is slightly decreased. Superior segment right lower lobe subsolid lung mass is stable. 6. Increased sclerosis within the T10 spinal metastasis, nonspecific, potentially due to treatment change. Left posterior tenth rib expansile metastasis is slightly increased in thickness. Far inferior plaque-like pleural lesion adjacent to the left twelfth rib is stable. No new osseous lesions. 7. No evidence of metastatic disease in the abdomen or pelvis. 8. Marked sigmoid diverticulosis with chronic mild sigmoid wall thickening, favoring chronic diverticulosis. 9.  Aortic Atherosclerosis (ICD10-I70.0). These results will be called to the ordering clinician or representative by the Radiologist Assistant, and communication documented in the PACS or zVision Dashboard. Electronically Signed   By: Ilona Sorrel M.D.   On: 02/15/2019 16:13   Ct Abdomen Pelvis W Contrast  Result Date: 02/23/2019 CLINICAL DATA:  68 year old presenting with acute onset of LEFT LOWER abdominal pain and nausea/vomiting that began yesterday. Current history of urinary tract calculi. 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 EXAM: CT ABDOMEN AND PELVIS WITH CONTRAST TECHNIQUE: Multidetector CT imaging of the abdomen and pelvis was  performed using the standard protocol following bolus administration of intravenous contrast. CONTRAST:  155mL OMNIPAQUE IOHEXOL 300 MG/ML IV. COMPARISON:  02/15/2019 and earlier, including PET-CT 09/18/2018. FINDINGS: Lower chest: Visualized lung bases clear apart from the expected dependent atelectasis posteriorly in the lower lobes. Stable mild BILATERAL lower lobe bronchiectasis. Stable normal heart size. Necrotic LEFT pericardiophrenic lymph node measuring approximately 2.6 x 3.3 x 3.6 cm, slightly increased in size since the January, 2020 CT. Hepatobiliary: Multiple sub-5 mm low-attenuation lesions throughout both lobes of the liver, unchanged since the January, 2020 examination. No new or enlarging liver lesions. Gallbladder normal in appearance without calcified gallstones. No biliary ductal dilation. Pancreas: Normal in appearance without evidence of mass, ductal dilation, or inflammation. Spleen: Normal in size and appearance. Adrenals/Urinary Tract: Normal appearing adrenal glands. Severe scarring involving both kidneys. BILATERAL nephrolithiasis, the largest stone measuring approximately 1 cm in a LOWER pole calyx of the RIGHT kidney. No obstructing ureteral calculi on either side. Cortical cysts and calyceal diverticula involving both kidneys. No convincing solid masses involving either kidney. Large extrarenal pelvis on the LEFT and small extrarenal pelvis on the RIGHT. Normal appearing decompressed urinary bladder. Stomach/Bowel: Stomach normal in appearance for the degree of distention. Normal-appearing small bowel. Entire colon relatively decompressed. Mild wall thickening diffusely throughout the decompressed colon. Distal descending and sigmoid colon diverticulosis without evidence of acute diverticulitis involving the proximal sigmoid colon in the LEFT side of the mid pelvis. Complex fluid collection in this location which is likely intramural. No evidence of extraluminal gas. Appendix not  clearly demonstrated, but no pericecal inflammation. Vascular/Lymphatic: Severe aortoiliofemoral atherosclerosis with calcified and noncalcified plaque involving the abdominal aorta. Possible filling defects involving the BILATERAL iliofemoral veins, though this may be artifactual and related to incomplete opacification. No pathologic lymphadenopathy. Reproductive: Normal appearing atrophic uterus consistent with patient age. No adnexal masses. Other: None. Musculoskeletal: LEFT posterolateral tenth rib metastasis measuring approximately 3.8 x 1.9 cm, decreased in size since the prior PET-CT after radiation treatment. Mixed lytic and sclerotic metastasis involving the T10 vertebral body and the RIGHT pedicle of T10, more conspicuous than on  the January, 2020 examination. No new osseous metastases elsewhere. IMPRESSION: 1. Acute diverticulitis involving the proximal sigmoid colon with evidence of an intramural abscess in the wall of the sigmoid colon. No evidence of perforation. 2. Enlarging necrotic left pericardiophrenic lymph node since the January, 2020 CT. 3. Stable sub-5 mm liver lesions which are too small to characterize by CT. 4. Possible bilateral iliofemoral DVT, though this may be artifactual and related to incomplete opacification. Does the patient have clinical symptoms of BILATERAL LOWER extremity edema? If so, LOWER extremity venous Doppler ultrasound may be helpful to confirm a benign this finding. 5. Osseous metastasis involving the T10 vertebral body and the right pedicle of T10, more conspicuous than in January, 2020. Interval decrease in size of a LEFT tenth rib metastasis after treatment with radiation therapy. No new osseous metastases. 6. BILATERAL nephrolithiasis. No obstructing ureteral calculi. Severe scarring involving both kidneys. 7.  Aortic Atherosclerosis, severe.  (ICD10-170.0) Electronically Signed   By: Evangeline Dakin M.D.   On: 02/23/2019 13:40   Ct Abdomen Pelvis W  Contrast  Result Date: 02/15/2019 CLINICAL DATA:  Stage IV left upper lobe high-grade neuroendocrine lung carcinoma with small and large cell features diagnosed November 2019. interval chemotherapy and immunotherapy. Restaging. Palliative radiation therapy to thoracic spine and left rib lesions completed 02/08/2019. EXAM: CT CHEST, ABDOMEN, AND PELVIS WITH CONTRAST TECHNIQUE: Multidetector CT imaging of the chest, abdomen and pelvis was performed following the standard protocol during bolus administration of intravenous contrast. CONTRAST:  159mL OMNIPAQUE IOHEXOL 300 MG/ML  SOLN COMPARISON:  12/03/2018 CT chest, abdomen and pelvis. FINDINGS: CT CHEST FINDINGS Cardiovascular: Normal heart size. No significant pericardial effusion/thickening. Right internal jugular Port-A-Cath terminates in the lower third of the SVC. Atherosclerotic nonaneurysmal thoracic aorta. Normal caliber pulmonary arteries. No central pulmonary emboli. Mediastinum/Nodes: No discrete thyroid nodules. Unremarkable esophagus. No axillary adenopathy. Bulky 3.6 x 2.9 cm left pericardiophrenic mass (series 2/image 40), increased from 2.9 x 2.3 cm. Enlarged 1.9 cm short axis left prevascular node (series 2/image 17), increased from 1.4 cm using similar measurement technique. No hilar adenopathy. Lungs/Pleura: No pneumothorax. No right pleural effusion. New trace dependent left pleural effusion. Far inferior plaque-like posterior left pleural lesion measures 0.7 cm thickness, previously 0.8 cm, not appreciably changed (series 7/image 15). Subsolid 4.1 x 2.4 cm medial superior segment right lower lobe mass (series 6/image 51), previously 4.1 x 2.4 cm using similar measurement technique, stable. Thick irregular bandlike focus of consolidation with surrounding patchy ground-glass opacity in the right upper lobe is unchanged. Solid 2.1 x 1.7 cm left upper lobe pulmonary nodule (series 6/image 53), previously 2.4 x 1.7 cm, slightly decreased. No new  significant pulmonary nodules. No acute consolidative airspace disease. Musculoskeletal: Left posterior tenth rib expansile lesion measures 2.2 cm thickness, mildly increased from 1.8 cm, with associated healed pathologic fracture. Increased sclerosis within the sclerotic ill-defined T10 vertebral body lesion extending into the right pedicle. No new focal osseous lesions in the chest. Minimal thoracic spondylosis. CT ABDOMEN PELVIS FINDINGS Hepatobiliary: Normal liver size. Several tiny subcentimeter hypodense lesions scattered throughout the liver, too small to characterize, unchanged. No new liver lesions. Normal gallbladder with no radiopaque cholelithiasis. No biliary ductal dilatation. Pancreas: Normal, with no mass or duct dilation. Spleen: Normal size. No mass. Adrenals/Urinary Tract: Normal adrenals. New 3 mm stone at the right ureterovesical junction. Minimal right hydroureteronephrosis, minimally increased from prior. Increased urothelial wall thickening and enhancement throughout the right renal collecting system and right ureter. Several nonobstructing stones in the  kidneys bilaterally, largest 8 mm in the lower right kidney and 5 mm in the lower left kidney. No overt left hydronephrosis. Hypodense 2.4 cm lateral interpolar right renal cortical lesion is stable in size. Numerous subcentimeter hypodense renal cortical lesions throughout both kidneys are too small to characterize and are not appreciably changed. Nondistended bladder with suggestion of new mild bladder wall thickening and mucosal hyperenhancement. Stomach/Bowel: Normal non-distended stomach. Normal caliber small bowel with no small bowel wall thickening. Appendix not discretely visualized. Marked sigmoid diverticulosis. Similar chronic mild wall thickening in the sigmoid colon without acute pericolonic fat stranding. Vascular/Lymphatic: Atherosclerotic nonaneurysmal abdominal aorta. Patent portal, splenic, hepatic and renal veins. No  pathologically enlarged lymph nodes in the abdomen or pelvis. Reproductive: Grossly normal uterus. Stable simple benign appearing 1.2 cm right adnexal cyst. No left adnexal mass. Other: No pneumoperitoneum, ascites or focal fluid collection. Musculoskeletal: No aggressive appearing focal osseous lesions. Moderate lower lumbar spondylosis. IMPRESSION: 1. New 3 mm stone at the right ureterovesical junction, with minimal right hydroureteronephrosis, slightly increased from prior. 2. New mild diffuse bladder wall thickening and mucosal hyperenhancement. New urothelial wall thickening and hyperenhancement throughout the right renal collecting system and right ureter. Findings suggest acute cystitis with ascending right urinary tract infection. Suggest correlation with urinalysis. 3. Bulky left pericardiophrenic and left prevascular mediastinal nodal metastases have increased. 4. New trace dependent left pleural effusion. 5. Left upper lobe solid pulmonary nodule is slightly decreased. Superior segment right lower lobe subsolid lung mass is stable. 6. Increased sclerosis within the T10 spinal metastasis, nonspecific, potentially due to treatment change. Left posterior tenth rib expansile metastasis is slightly increased in thickness. Far inferior plaque-like pleural lesion adjacent to the left twelfth rib is stable. No new osseous lesions. 7. No evidence of metastatic disease in the abdomen or pelvis. 8. Marked sigmoid diverticulosis with chronic mild sigmoid wall thickening, favoring chronic diverticulosis. 9.  Aortic Atherosclerosis (ICD10-I70.0). These results will be called to the ordering clinician or representative by the Radiologist Assistant, and communication documented in the PACS or zVision Dashboard. Electronically Signed   By: Ilona Sorrel M.D.   On: 02/15/2019 16:13   Vas [place] Lower Extremity Venous (dvt) (only [facility] 7a-7p)  Result Date: 02/23/2019  Lower Venous Study Indications: Possible iliac  thrombosis vs. artifact by CT. No lower extremity edema.  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 Presenting with acute diverticulitis involving the proximal sigmoid colon with evidence of intramural abscess in the wall of the sigmoid colon. Comparison Study: No prior study on file for comparison. Performing Technologist: Sharion Dove RVS  Examination Guidelines: A complete evaluation includes B-mode imaging, spectral Doppler, color Doppler, and power Doppler as needed of all accessible portions of each vessel. Bilateral testing is considered an integral part of a complete examination. Limited examinations for reoccurring indications may be performed as noted.  Right Venous Findings: +---------+---------------+---------+-----------+----------+-------+            Compressibility Phasicity Spontaneity Properties Summary  +---------+---------------+---------+-----------+----------+-------+  CFV       Full            Yes       Yes                             +---------+---------------+---------+-----------+----------+-------+  SFJ       Full                                                      +---------+---------------+---------+-----------+----------+-------+  FV Prox   Full                                                      +---------+---------------+---------+-----------+----------+-------+  FV Mid    Full                                                      +---------+---------------+---------+-----------+----------+-------+  FV Distal Full                                                      +---------+---------------+---------+-----------+----------+-------+  PFV       Full                                                      +---------+---------------+---------+-----------+----------+-------+  POP       Full            Yes       Yes                             +---------+---------------+---------+-----------+----------+-------+  PTV       Full                                                       +---------+---------------+---------+-----------+----------+-------+  PERO      Full                                                      +---------+---------------+---------+-----------+----------+-------+  Left Venous Findings: +---------+---------------+---------+-----------+----------+-------+            Compressibility Phasicity Spontaneity Properties Summary  +---------+---------------+---------+-----------+----------+-------+  CFV       Full            Yes       Yes                             +---------+---------------+---------+-----------+----------+-------+  SFJ       Full                                                      +---------+---------------+---------+-----------+----------+-------+  FV Prox   Full                                                      +---------+---------------+---------+-----------+----------+-------+  FV Mid    Full                                                      +---------+---------------+---------+-----------+----------+-------+  FV Distal Full                                                      +---------+---------------+---------+-----------+----------+-------+  PFV       Full                                                      +---------+---------------+---------+-----------+----------+-------+  POP       Full            Yes       Yes                             +---------+---------------+---------+-----------+----------+-------+  PTV       Full                                                      +---------+---------------+---------+-----------+----------+-------+  PERO      Full                                                      +---------+---------------+---------+-----------+----------+-------+    Summary: Right: There is no evidence of deep vein thrombosis in the lower extremity. Left: There is no evidence of deep vein thrombosis in the lower extremity.  *See table(s) above for measurements and observations.    Preliminary

## 2019-02-25 NOTE — Progress Notes (Signed)
PROGRESS NOTE    Alison Sullivan  YPP:[id number] DOB: 10/30/1951 DOA: 02/23/2019 PCP: [name], MD    Brief Narrative:  68 year old female who presented with diarrhea, nausea, vomiting and abdominal pain for the last 5 days.  She does have the significant past medical history for stage IV, high-grade neuroendocrine carcinoma, with bony and lymphatic metastasis, on radiation and chemotherapy.  Reported 5 days of abdominal pain, moderate to severe in intensity.  On her initial physical examination her blood pressure was 83/59, heart rate 76, respirate 22, oxygen saturation 97%, she had dry mucous membranes, lungs had decreased breath sounds at bases, heart S1-S2 present rhythmic, the abdomen was tender in the left lower quadrant, no lower extremity edema.  Sodium was 134, potassium 3.1, chloride 104, bicarb 21, glucose 116, BUN 19, creatinine 0.61, white count 4.2, hemoglobin 9.6, hematocrit 28.8, platelets 235.  CT of the abdomen and pelvis showed acute diverticulitis involving the proximal sigmoid colon with evidence of intramural abscess in the wall of the sigmoid colon.  No evidence of perforation.  EKG 80 bpm, normal intervals, normal axis, sinus rhythm with normal conduction, ST segment depressions with T wave inversions in V2 through V5.   Patient was admitted to the hospital with a working diagnosis of acute diverticulitis complicated by hypokalemia   Assessment & Plan:   Principal Problem:   Diverticulitis of sigmoid colon Active Problems:   Bone metastases ([place])   Abscess of sigmoid colon due to diverticulitis   Neuroendocrine carcinoma of the left lung    Hypokalemia   Thrombus port  A cath  1. Acute proximal sigmoid diverticulitis with intraluminal abscess. Continue IV fluids at 75 ml per H (dextrose with balanced electrolyte solutions). Continue with IV antibiotic Zosyn #2. Pain control with IV morphine. Continue patient npo. Plan for repeat CT abdomen in 48 H per  surgery recommendations.   2. Metastatic neuroendocrine carcinoma of the left lung. Patient under current treatment with chemotherapy and radiation therapy.  Her wbc is down to 1,3 with neutropenia down to 0.8. Will continue IV antibiotic therapy with Zosyn. Follow with oncology in regards of white cell stimulating factors. Hgb at 8.1 with Plt at 174. Continue to follow cell count in am.   3. Hypokalemia and Hyponatremia. Continue hydration with dextrose and lactate ringers. K correction with Kcl, will order 80 meq for today in 2 divided doses. Serum bicarbonate at 21. Preserved renal function with serum cr at 0.59.  4. Anemia due to malignancy. Stable hb and hct.   5. GERD. On pantoprazole with good toleration, continue to be NPO.   6. Non occlusive thrombus of the Port A cath.  at home patient on warfarin, while hospitalized, continue enoxaparin therapeutic doses in case of surgical intervention needed.   DVT prophylaxis: scd   Code Status:  full Family Communication: no family at the bedside  Disposition Plan/ discharge barriers: pending clinical improvement.    There is no height or weight on file to calculate BMI. Malnutrition Type:      Malnutrition Characteristics:      Nutrition Interventions:     RN Pressure Injury Documentation:     Consultants:    Surgery   Oncology   Procedures:     Antimicrobials:   IV Zosyn     Subjective: Improved abdominal pain, but not yet back to baseline, no nausea or vomiting, has been NPO since admission. No chest pain or dyspnea.   Objective: Vitals:   02/24/19 0434 02/24/19  1411 02/24/19 2050 02/25/19 0619  BP: (!) 100/59 100/60 99/63 (!) 94/54  Pulse: 81 (!) 57 61 62  Resp: 16 16 18 18   Temp: 98.8 F (37.1 C) 98.5 F (36.9 C) 98.7 F (37.1 C) 97.8 F (36.6 C)  TempSrc: Oral Oral Oral Oral  SpO2: 96% 97% 97% 97%    Intake/Output Summary (Last 24 hours) at 02/25/2019 1132 Last data filed at 02/24/2019  1800 Gross per 24 hour  Intake 300 ml  Output -  Net 300 ml   There were no vitals filed for this visit.  Examination:   General: deconditioned  Neurology: Awake and alert, non focal  E ENT: mild pallor, no icterus, oral mucosa moist Cardiovascular: No JVD. S1-S2 present, rhythmic, no gallops, rubs, or murmurs. No lower extremity edema. Pulmonary: positive breath sounds bilaterally, adequate air movement, no wheezing, rhonchi or rales. Gastrointestinal. Abdomen with no organomegaly, tender to deep palpation at the lower quadrants, but no rebound or guarding Skin. No rashes Musculoskeletal: no joint deformities     Data Reviewed: I have personally reviewed following labs and imaging studies  CBC: Recent Labs  Lab 02/22/19 1450 02/23/19 1212 02/24/19 0409 02/25/19 0331  WBC 6.5 4.2 2.0* 1.3*  NEUTROABS 6.1 3.9 1.5* 0.8*  HGB 11.3* 9.6* 8.6* 8.1*  HCT 33.7* 28.8* 26.6* 25.0*  MCV 100.3* 101.8* 103.9* 103.3*  PLT 317 235 197 371   Basic Metabolic Panel: Recent Labs  Lab 02/22/19 1450 02/23/19 1212 02/24/19 0409 02/25/19 0331  NA 139 134* 136 135  K 3.9 3.1* 3.1* 3.0*  CL 103 104 106 105  CO2 20* 21* 20* 21*  GLUCOSE 111* 116* 113* 105*  BUN 20 19 16 11   CREATININE 0.79 0.61 0.70 0.59  CALCIUM 9.0 8.1* 7.9* 8.1*  MG 1.8  --   --   --    GFR: Estimated Creatinine Clearance: 63.9 mL/min (by C-G formula based on SCr of 0.59 mg/dL). Liver Function Tests: Recent Labs  Lab 02/22/19 1450 02/23/19 1212  AST 21 23  ALT 14 16  ALKPHOS 106 80  BILITOT 0.7 0.9  PROT 7.6 6.7  ALBUMIN 3.4* 3.2*   Recent Labs  Lab 02/23/19 1212  LIPASE 25   No results for input(s): AMMONIA in the last 168 hours. Coagulation Profile: Recent Labs  Lab 02/23/19 1212  INR 1.7*   Cardiac Enzymes: Recent Labs  Lab 02/23/19 1213  TROPONINI <0.03   BNP (last 3 results) No results for input(s): PROBNP in the last 8760 hours. HbA1C: No results for input(s): HGBA1C in the  last 72 hours. CBG: No results for input(s): GLUCAP in the last 168 hours. Lipid Profile: No results for input(s): CHOL, HDL, LDLCALC, TRIG, CHOLHDL, LDLDIRECT in the last 72 hours. Thyroid Function Tests: Recent Labs    02/22/19 1451  TSH 0.548   Anemia Panel: No results for input(s): VITAMINB12, FOLATE, FERRITIN, TIBC, IRON, RETICCTPCT in the last 72 hours.    Radiology Studies: I have reviewed all of the imaging during this hospital visit personally     Scheduled Meds: . enoxaparin (LOVENOX) injection  1 mg/kg Subcutaneous Q12H  . gabapentin  300 mg Oral TID  . Melatonin  10 mg Oral QHS  . pantoprazole  40 mg Oral Daily  . potassium chloride  40 mEq Oral Once  . scopolamine  1 patch Transdermal Q72H  . Tbo-filgastrim (GRANIX) SQ  300 mcg Subcutaneous q1800   Continuous Infusions: . dextrose 5% lactated ringers 75 mL/hr at 02/24/19  1527  . piperacillin-tazobactam (ZOSYN)  IV 3.375 g (02/25/19 0500)     LOS: 2 days        Mauricio Gerome Apley, MD

## 2019-02-25 NOTE — Progress Notes (Signed)
Pharmacy Antibiotic Note  Alison Sullivan is a 68 y.o. female admitted on 02/23/2019 with diverticulitis.  Pharmacy has been consulted for Zosyn dosing.  Day #3 Zosyn Afebrile WBC 1.3, decreased SCr 0.59, stable  Plan:  Continue with Zosyn 3.375g IV q8h (each dose infused over 4 hours).  No dose adjustments needed, Pharmacy will sign off and monitor peripherally via electronic surveillance software    Temp (24hrs), Avg:98.3 F (36.8 C), Min:97.8 F (36.6 C), Max:98.7 F (37.1 C)  Recent Labs  Lab 02/18/19 1002 02/22/19 1450 02/23/19 1212 02/23/19 1557 02/24/19 0409 02/25/19 0331  WBC 5.3 6.5 4.2  --  2.0* 1.3*  CREATININE 0.84 0.79 0.61  --  0.70 0.59  LATICACIDVEN  --   --   --  0.8  --   --     Estimated Creatinine Clearance: 63.9 mL/min (by C-G formula based on SCr of 0.59 mg/dL).    Allergies  Allergen Reactions  . Contrast Media [Iodinated Diagnostic Agents] Diarrhea and Other (See Comments)    Stomach cramps - can take IV not oral liquid    Antimicrobials this admission: 4/11 Zosyn >>  Dose adjustments this admission: --  Microbiology results: GI panel, stool: ordered HIV antibody: non-reactive  Thank you for allowing pharmacy to be a part of this patient's care.  Peggyann Juba, PharmD, BCPS Pager: [phone] 02/23/2019 4:14 PM

## 2019-02-25 NOTE — Progress Notes (Signed)
CRITICAL VALUE ALERT  Critical Value:  WBC 1.3  Date & Time Notied:  [id number]  Provider Notified: YES  Orders Received/Actions taken: NO

## 2019-02-25 NOTE — Telephone Encounter (Signed)
Son aware Alison Sullivan knows she is in hospital.

## 2019-02-25 NOTE — Progress Notes (Signed)
HEMATOLOGY-ONCOLOGY PROGRESS NOTE  SUBJECTIVE: Alison Sullivan is a 68 year old female followed by oncology for stage IV high-grade neuroendocrine carcinoma with small and large cell features diagnosed in November 2019.  She is currently receiving treatment with carboplatin for an AUC of 5, Alimta 500 mg meter squared, and Keytruda 200 mg IV every 3 weeks.  She is status post 4 cycles of treatment.  Last cycle of her treatment was given on 02/18/2019.  She was admitted to the hospital with nausea, vomiting, and abdominal pain with diarrhea.  A CT scan of the abdomen and pelvis was performed which showed acute diverticulitis involving the proximal sigmoid colon with evidence of intramural abscess in the wall of the sigmoid colon.  General surgery has been consulted and does not plan any surgical intervention at this time.  She is currently being treated with Zosyn.  The patient denies fevers and chills.  Reports that she still has abdominal pain particularly in her left lower quadrant.  Has mild nausea but no vomiting.  Reports that  her diarrhea has resolved.  Currently n.p.o. except ice chips.  Denies bleeding.  Reports pain to her left ribs.    Large cell carcinoma of left lung, stage 4 (H[facility])   10/18/2018 Initial Diagnosis    Large cell carcinoma of left lung, stage 4 ([place])    10/29/2018 - 12/09/2018 Chemotherapy    The patient had palonosetron (ALOXI) injection 0.25 mg, 0.25 mg, Intravenous,  Once, 2 of 4 cycles Administration: 0.25 mg (10/29/2018), 0.25 mg (11/19/2018) pegfilgrastim-cbqv (UDENYCA) injection 6 mg, 6 mg, Subcutaneous, Once, 1 of 3 cycles Administration: 6 mg (11/23/2018) CARBOplatin (PARAPLATIN) 420 mg in sodium chloride 0.9 % 250 mL chemo infusion, 420 mg (100 % of original dose 418 mg), Intravenous,  Once, 2 of 4 cycles Dose modification: 418 mg (original dose 418 mg, Cycle 1), 418 mg (original dose 418 mg, Cycle 2) Administration: 420 mg (10/29/2018), 420 mg (11/19/2018) etoposide  (VEPESID) 180 mg in sodium chloride 0.9 % 500 mL chemo infusion, 100 mg/m2 = 180 mg, Intravenous,  Once, 2 of 4 cycles Administration: 180 mg (10/29/2018), 180 mg (10/30/2018), 180 mg (10/31/2018), 180 mg (11/19/2018), 180 mg (11/20/2018), 180 mg (11/21/2018) fosaprepitant (EMEND) 150 mg, dexamethasone (DECADRON) 12 mg in sodium chloride 0.9 % 145 mL IVPB, , Intravenous,  Once, 2 of 4 cycles Administration:  (10/29/2018),  (11/19/2018) atezolizumab (TECENTRIQ) 1,200 mg in sodium chloride 0.9 % 250 mL chemo infusion, 1,200 mg, Intravenous, Once, 2 of 8 cycles Administration: 1,200 mg (10/29/2018), 1,200 mg (11/19/2018)  for chemotherapy treatment.     12/20/2018 -  Chemotherapy    The patient had palonosetron (ALOXI) injection 0.25 mg, 0.25 mg, Intravenous,  Once, 4 of 4 cycles Administration: 0.25 mg (12/20/2018), 0.25 mg (01/28/2019), 0.25 mg (02/18/2019), 0.25 mg (01/10/2019) PEMEtrexed (ALIMTA) 900 mg in sodium chloride 0.9 % 100 mL chemo infusion, 515 mg/m2 = 875 mg, Intravenous,  Once, 4 of 6 cycles Administration: 900 mg (12/20/2018), 900 mg (01/28/2019), 900 mg (02/18/2019), 900 mg (01/10/2019) CARBOplatin (PARAPLATIN) 400 mg in sodium chloride 0.9 % 250 mL chemo infusion, 400 mg (100 % of original dose 403 mg), Intravenous,  Once, 4 of 4 cycles Dose modification: 403 mg (original dose 403 mg, Cycle 1), 403 mg (original dose 403 mg, Cycle 3), 403 mg (original dose 403 mg, Cycle 4), 403 mg (original dose 403 mg, Cycle 2) Administration: 400 mg (12/20/2018), 400 mg (01/28/2019), 400 mg (02/18/2019), 400 mg (01/10/2019) pembrolizumab (KEYTRUDA) 200 mg in sodium chloride  0.9 % 50 mL chemo infusion, 200 mg, Intravenous, Once, 4 of 6 cycles Administration: 200 mg (12/20/2018), 200 mg (01/28/2019), 200 mg (02/18/2019), 200 mg (01/10/2019) fosaprepitant (EMEND) 150 mg, dexamethasone (DECADRON) 12 mg in sodium chloride 0.9 % 145 mL IVPB, , Intravenous,  Once, 4 of 4 cycles Administration:  (12/20/2018),  (01/28/2019),  (02/18/2019),   (01/10/2019)  for chemotherapy treatment.      REVIEW OF SYSTEMS:   Constitutional: Denies fevers, chills Eyes: Denies blurriness of vision Ears, nose, mouth, throat, and face: Denies mucositis or sore throat Respiratory: Denies cough, dyspnea or wheezes Cardiovascular: Denies palpitation, chest discomfort Gastrointestinal: Reports left lower quadrant abdominal pain.  Has mild nausea but no vomiting.  Diarrhea has resolved. Skin: Denies abnormal skin rashes Lymphatics: Denies new lymphadenopathy or easy bruising Neurological:Denies numbness, tingling or new weaknesses Behavioral/Psych: Mood is stable, no new changes  Extremities: No lower extremity edema All other systems were reviewed with the patient and are negative.  I have reviewed the past medical history, past surgical history, social history and family history with the patient and they are unchanged from previous note.   PHYSICAL EXAMINATION:  Vitals:   02/24/19 2050 02/25/19 0619  BP: 99/63 (!) 94/54  Pulse: 61 62  Resp: 18 18  Temp: 98.7 F (37.1 C) 97.8 F (36.6 C)  SpO2: 97% 97%   GENERAL:alert, no distress and comfortable SKIN: skin color, texture, turgor are normal, no rashes or significant lesions EYES: normal, Conjunctiva are pink and non-injected, sclera clear OROPHARYNX:no exudate, no erythema and lips, buccal mucosa, and tongue normal  NECK: supple, thyroid normal size, non-tender, without nodularity LYMPH:  no palpable lymphadenopathy in the cervical, axillary or inguinal LUNGS: clear to auscultation and percussion with normal breathing effort HEART: regular rate & rhythm and no murmurs and no lower extremity edema ABDOMEN: Positive bowel sounds.  Nondistended.  Mild discomfort with light palpation to the left lower quadrant. Musculoskeletal:no cyanosis of digits and no clubbing  NEURO: alert & oriented x 3 with fluent speech, no focal motor/sensory deficits  LABORATORY DATA:  I have reviewed the data  as listed CMP Latest Ref Rng & Units 02/25/2019 02/24/2019 02/23/2019  Glucose 70 - 99 mg/dL 105(H) 113(H) 116(H)  BUN 8 - 23 mg/dL 11 16 19   Creatinine 0.44 - 1.00 mg/dL 0.59 0.70 0.61  Sodium 135 - 145 mmol/L 135 136 134(L)  Potassium 3.5 - 5.1 mmol/L 3.0(L) 3.1(L) 3.1(L)  Chloride 98 - 111 mmol/L 105 106 104  CO2 22 - 32 mmol/L 21(L) 20(L) 21(L)  Calcium 8.9 - 10.3 mg/dL 8.1(L) 7.9(L) 8.1(L)  Total Protein 6.5 - 8.1 g/dL - - 6.7  Total Bilirubin 0.3 - 1.2 mg/dL - - 0.9  Alkaline Phos 38 - 126 U/L - - 80  AST 15 - 41 U/L - - 23  ALT 0 - 44 U/L - - 16    Lab Results  Component Value Date   WBC 1.3 (LL) 02/25/2019   HGB 8.1 (L) 02/25/2019   HCT 25.0 (L) 02/25/2019   MCV 103.3 (H) 02/25/2019   PLT 174 02/25/2019   NEUTROABS 1.5 (L) 02/24/2019    ASSESSMENT AND PLAN: This is a very pleasant 68 year old white female recently diagnosed with high-grade neuroendocrine carcinoma. The patient underwent systemic chemotherapy with small cell regimen including carboplatin, etoposide and Tecentriq status post 2 cycles. She tolerated her treatment well but unfortunately repeat CT scan of the chest, abdomen and pelvis showed evidence for disease progression. The patient was  started on systemic chemotherapy with carboplatin, Alimta and Keytruda status post 4 cycles.  The patient has been tolerating this treatment well with no concerning adverse effects except for fatigue.  She is currently admitted to the hospital for acute diverticulitis.  She is receiving IV antibiotics.  Remains afebrile.  Labs from today have been reviewed.  Total white blood cell count is trending downwards and is currently 1.3.  Differential has not been completed yet and I have added this onto her labs already drawn today.  May consider Granix pending the results of the differential.  She is reporting increased pain to her left ribs.  CT scan of the chest was performed on 02/15/2019.  Lesion in this area was reported as stable on  that scan.  There were no new osseous lesions.  I will discuss this further with Dr. Julien Nordmann to see if any further imaging is needed.  For the suspicious nonocclusive blood clot at the tip of the Port-A-Cath, she is currently on a therapeutic dose of Lovenox.  Coumadin is being held currently due to the possibility of a need for a procedure.  Mikey Bussing, DNP, AGPCNP-BC, AOCNP  ADDENDUM: ANC is 0.8 today. Discussed with Dr.Mohamed and will initiate Granix 300 mg sq daily X 2 doses.

## 2019-02-25 NOTE — Progress Notes (Signed)
Subjective/Chief Complaint: About the same, was having diarrhea but now without bm of flatus, no n/v   Objective: Vital signs in last 24 hours: Temp:  [97.8 F (36.6 C)-98.7 F (37.1 C)] 97.8 F (36.6 C) (04/13 0619) Pulse Rate:  [57-62] 62 (04/13 0619) Resp:  [16-18] 18 (04/13 0619) BP: (94-100)/(54-63) 94/54 (04/13 0619) SpO2:  [97 %] 97 % (04/13 0619) Last BM Date: 02/23/19  Intake/Output from previous day: 04/12 0701 - 04/13 0700 In: 300 [I.V.:300] Out: -  Intake/Output this shift: No intake/output data recorded.  A/O follows commands lying comfortably in bed Pupils equal cv rrr Lungs clear bilaterally Ab soft mild tenderness suprapubic few bs   Lab Results:  Recent Labs    02/24/19 0409 02/25/19 0331  WBC 2.0* 1.3*  HGB 8.6* 8.1*  HCT 26.6* 25.0*  PLT 197 174   BMET Recent Labs    02/24/19 0409 02/25/19 0331  NA 136 135  K 3.1* 3.0*  CL 106 105  CO2 20* 21*  GLUCOSE 113* 105*  BUN 16 11  CREATININE 0.70 0.59  CALCIUM 7.9* 8.1*   PT/INR Recent Labs    02/23/19 1212  LABPROT 19.5*  INR 1.7*   ABG No results for input(s): PHART, HCO3 in the last 72 hours.  Invalid input(s): PCO2, PO2  Studies/Results: Ct Abdomen Pelvis W Contrast  Result Date: 02/23/2019 CLINICAL DATA:  68 year old presenting with acute onset of LEFT LOWER abdominal pain and nausea/vomiting that began yesterday. Current history of urinary tract calculi. 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 EXAM: CT ABDOMEN AND PELVIS WITH CONTRAST TECHNIQUE: Multidetector CT imaging of the abdomen and pelvis was performed using the standard protocol following bolus administration of intravenous contrast. CONTRAST:  146mL OMNIPAQUE IOHEXOL 300 MG/ML IV. COMPARISON:  02/15/2019  and earlier, including PET-CT 09/18/2018. FINDINGS: Lower chest: Visualized lung bases clear apart from the expected dependent atelectasis posteriorly in the lower lobes. Stable mild BILATERAL lower lobe bronchiectasis. Stable normal heart size. Necrotic LEFT pericardiophrenic lymph node measuring approximately 2.6 x 3.3 x 3.6 cm, slightly increased in size since the January, 2020 CT. Hepatobiliary: Multiple sub-5 mm low-attenuation lesions throughout both lobes of the liver, unchanged since the January, 2020 examination. No new or enlarging liver lesions. Gallbladder normal in appearance without calcified gallstones. No biliary ductal dilation. Pancreas: Normal in appearance without evidence of mass, ductal dilation, or inflammation. Spleen: Normal in size and appearance. Adrenals/Urinary Tract: Normal appearing adrenal glands. Severe scarring involving both kidneys. BILATERAL nephrolithiasis, the largest stone measuring approximately 1 cm in a LOWER pole calyx of the RIGHT kidney. No obstructing ureteral calculi on either side. Cortical cysts and calyceal diverticula involving both kidneys. No convincing solid masses involving either kidney. Large extrarenal pelvis on the LEFT and small extrarenal pelvis on the RIGHT. Normal appearing decompressed urinary bladder. Stomach/Bowel: Stomach normal in appearance for the degree of distention. Normal-appearing small bowel. Entire colon relatively decompressed. Mild wall thickening diffusely throughout the decompressed colon. Distal descending and sigmoid colon diverticulosis without evidence of acute diverticulitis involving the proximal sigmoid colon in the LEFT side of the mid pelvis. Complex fluid collection in this location which is likely intramural. No evidence of extraluminal gas. Appendix not clearly demonstrated, but no pericecal inflammation. Vascular/Lymphatic: Severe aortoiliofemoral atherosclerosis with calcified and noncalcified plaque involving the  abdominal  aorta. Possible filling defects involving the BILATERAL iliofemoral veins, though this may be artifactual and related to incomplete opacification. No pathologic lymphadenopathy. Reproductive: Normal appearing atrophic uterus consistent with patient age. No adnexal masses. Other: None. Musculoskeletal: LEFT posterolateral tenth rib metastasis measuring approximately 3.8 x 1.9 cm, decreased in size since the prior PET-CT after radiation treatment. Mixed lytic and sclerotic metastasis involving the T10 vertebral body and the RIGHT pedicle of T10, more conspicuous than on the January, 2020 examination. No new osseous metastases elsewhere. IMPRESSION: 1. Acute diverticulitis involving the proximal sigmoid colon with evidence of an intramural abscess in the wall of the sigmoid colon. No evidence of perforation. 2. Enlarging necrotic left pericardiophrenic lymph node since the January, 2020 CT. 3. Stable sub-5 mm liver lesions which are too small to characterize by CT. 4. Possible bilateral iliofemoral DVT, though this may be artifactual and related to incomplete opacification. Does the patient have clinical symptoms of BILATERAL LOWER extremity edema? If so, LOWER extremity venous Doppler ultrasound may be helpful to confirm a benign this finding. 5. Osseous metastasis involving the T10 vertebral body and the right pedicle of T10, more conspicuous than in January, 2020. Interval decrease in size of a LEFT tenth rib metastasis after treatment with radiation therapy. No new osseous metastases. 6. BILATERAL nephrolithiasis. No obstructing ureteral calculi. Severe scarring involving both kidneys. 7.  Aortic Atherosclerosis, severe.  (ICD10-170.0) Electronically Signed   By: Evangeline Dakin M.D.   On: 02/23/2019 13:40   Vas [place] Lower Extremity Venous (dvt) (only [facility] 7a-7p)  Result Date: 02/23/2019  Lower Venous Study Indications: Possible iliac thrombosis vs. artifact by CT. No lower extremity edema.  Risk  Factors: Cancer Large cell carcinoma of the left lung with spinal mets. Presenting with acute diverticulitis involving the proximal sigmoid colon with evidence of intramural abscess in the wall of the sigmoid colon. Comparison Study: No prior study on file for comparison. Performing Technologist: Sharion Dove RVS  Examination Guidelines: A complete evaluation includes B-mode imaging, spectral Doppler, color Doppler, and power Doppler as needed of all accessible portions of each vessel. Bilateral testing is considered an integral part of a complete examination. Limited examinations for reoccurring indications may be performed as noted.  Right Venous Findings: +---------+---------------+---------+-----------+----------+-------+          CompressibilityPhasicitySpontaneityPropertiesSummary +---------+---------------+---------+-----------+----------+-------+ CFV      Full           Yes      Yes                          +---------+---------------+---------+-----------+----------+-------+ SFJ      Full                                                 +---------+---------------+---------+-----------+----------+-------+ FV Prox  Full                                                 +---------+---------------+---------+-----------+----------+-------+ FV Mid   Full                                                 +---------+---------------+---------+-----------+----------+-------+  FV DistalFull                                                 +---------+---------------+---------+-----------+----------+-------+ PFV      Full                                                 +---------+---------------+---------+-----------+----------+-------+ POP      Full           Yes      Yes                          +---------+---------------+---------+-----------+----------+-------+ PTV      Full                                                  +---------+---------------+---------+-----------+----------+-------+ PERO     Full                                                 +---------+---------------+---------+-----------+----------+-------+  Left Venous Findings: +---------+---------------+---------+-----------+----------+-------+          CompressibilityPhasicitySpontaneityPropertiesSummary +---------+---------------+---------+-----------+----------+-------+ CFV      Full           Yes      Yes                          +---------+---------------+---------+-----------+----------+-------+ SFJ      Full                                                 +---------+---------------+---------+-----------+----------+-------+ FV Prox  Full                                                 +---------+---------------+---------+-----------+----------+-------+ FV Mid   Full                                                 +---------+---------------+---------+-----------+----------+-------+ FV DistalFull                                                 +---------+---------------+---------+-----------+----------+-------+ PFV      Full                                                 +---------+---------------+---------+-----------+----------+-------+  POP      Full           Yes      Yes                          +---------+---------------+---------+-----------+----------+-------+ PTV      Full                                                 +---------+---------------+---------+-----------+----------+-------+ PERO     Full                                                 +---------+---------------+---------+-----------+----------+-------+    Summary: Right: There is no evidence of deep vein thrombosis in the lower extremity. Left: There is no evidence of deep vein thrombosis in the lower extremity.  *See table(s) above for measurements and observations.    Preliminary      Anti-infectives: Anti-infectives (From admission, onward)   Start     Dose/Rate Route Frequency Ordered Stop   02/23/19 2000  piperacillin-tazobactam (ZOSYN) IVPB 3.375 g     3.375 g 12.5 mL/hr over 240 Minutes Intravenous Every 8 hours 02/23/19 1608     02/23/19 1400  piperacillin-tazobactam (ZOSYN) IVPB 3.375 g     3.375 g 100 mL/hr over 30 Minutes Intravenous  Once 02/23/19 1347 02/23/19 1427      Assessment/Plan: Diverticulitis with likely intramural abscess -continue zosyn, she is stable today, if not significant change plan repeat ct scan 4/15 -little bit more difficult to follow indicators such as wbc due to chemotherapy  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 Follow by Dr. Earlie Server. On chemotx now Involve oncology in her decision planning -defer to medical primary service Mets to left rib andT10 Rad tx by Dr. Isidore Moos History of UTI and ? pyelonephritis Anticoagulated on Coumadin For thrombosis involving her porta cath.  Agree with holding oral anticoagulation for now in case interventional radiology or surgery needed Now placed on Lovenox History of kidney stones Smoked until last fall Severe atherosclerosis by CT scan   Rolm Bookbinder 02/25/2019

## 2019-02-26 ENCOUNTER — Inpatient Hospital Stay (HOSPITAL_COMMUNITY): Payer: Medicare Other

## 2019-02-26 LAB — CBC WITH DIFFERENTIAL/PLATELET
Abs Immature Granulocytes: 0.04 10*3/uL (ref 0.00–0.07)
Basophils Absolute: 0 10*3/uL (ref 0.0–0.1)
Basophils Relative: 0 %
Eosinophils Absolute: 0 10*3/uL (ref 0.0–0.5)
Eosinophils Relative: 0 %
HCT: 26.6 % — ABNORMAL LOW (ref 36.0–46.0)
Hemoglobin: 8.8 g/dL — ABNORMAL LOW (ref 12.0–15.0)
Immature Granulocytes: 1 %
Lymphocytes Relative: 8 %
Lymphs Abs: 0.2 10*3/uL — ABNORMAL LOW (ref 0.7–4.0)
MCH: 34 pg (ref 26.0–34.0)
MCHC: 33.1 g/dL (ref 30.0–36.0)
MCV: 102.7 fL — ABNORMAL HIGH (ref 80.0–100.0)
Monocytes Absolute: 0.3 10*3/uL (ref 0.1–1.0)
Monocytes Relative: 11 %
Neutro Abs: 2.2 10*3/uL (ref 1.7–7.7)
Neutrophils Relative %: 80 %
Platelets: 149 10*3/uL — ABNORMAL LOW (ref 150–400)
RBC: 2.59 MIL/uL — ABNORMAL LOW (ref 3.87–5.11)
RDW: 17 % — ABNORMAL HIGH (ref 11.5–15.5)
WBC: 2.8 10*3/uL — ABNORMAL LOW (ref 4.0–10.5)
nRBC: 0 % (ref 0.0–0.2)

## 2019-02-26 LAB — BASIC METABOLIC PANEL
Anion gap: 11 (ref 5–15)
BUN: 15 mg/dL (ref 8–23)
CO2: 22 mmol/L (ref 22–32)
Calcium: 8.4 mg/dL — ABNORMAL LOW (ref 8.9–10.3)
Chloride: 103 mmol/L (ref 98–111)
Creatinine, Ser: 0.61 mg/dL (ref 0.44–1.00)
GFR calc Af Amer: >60 mL/min (ref >60)
GFR calc non Af Amer: >60 mL/min (ref >60)
Glucose, Bld: 79 mg/dL (ref 70–99)
Potassium: 2.9 mmol/L — ABNORMAL LOW (ref 3.5–5.1)
Sodium: 136 mmol/L (ref 135–145)

## 2019-02-26 MED ORDER — POTASSIUM CHLORIDE 10 MEQ/100ML IV SOLN: 10.0000 meq | INTRAVENOUS | Status: DC

## 2019-02-26 MED ORDER — IOHEXOL 300 MG/ML  SOLN
100.0000 mL | Freq: Once | INTRAMUSCULAR | Status: AC | PRN
  Administered 2019-02-26: 12:00:00 100 mL via INTRAVENOUS

## 2019-02-26 MED ORDER — DIPHENHYDRAMINE HCL 50 MG/ML IJ SOLN: 50.0000 mg | INTRAMUSCULAR | Status: DC

## 2019-02-26 MED ORDER — DOCUSATE SODIUM 100 MG PO CAPS
100.0000 mg | ORAL_CAPSULE | Freq: Two times a day (BID) | ORAL | Status: DC
  Administered 2019-02-26 (×2): 100 mg via ORAL
  Filled 2019-02-26 (×3): qty 1

## 2019-02-26 MED ORDER — MOXIFLOXACIN HCL 400 MG PO TABS
400.0000 mg | ORAL_TABLET | Freq: Every day | ORAL | Status: DC
  Administered 2019-02-27: 400 mg via ORAL
  Filled 2019-02-26: qty 1

## 2019-02-26 MED ORDER — POTASSIUM CHLORIDE CRYS ER 20 MEQ PO TBCR: 40.0000 meq | EXTENDED_RELEASE_TABLET | ORAL | Status: DC

## 2019-02-26 MED ORDER — DIPHENHYDRAMINE HCL 50 MG PO CAPS: 50.0000 mg | ORAL_CAPSULE | ORAL | Status: DC

## 2019-02-26 MED ORDER — SODIUM CHLORIDE (PF) 0.9 % IJ SOLN
INTRAMUSCULAR | Status: AC
  Filled 2019-02-26: qty 50

## 2019-02-26 MED ORDER — POTASSIUM CHLORIDE CRYS ER 20 MEQ PO TBCR
40.0000 meq | EXTENDED_RELEASE_TABLET | ORAL | Status: AC
  Administered 2019-02-26: 19:00:00 40 meq via ORAL
  Filled 2019-02-26 (×2): qty 2

## 2019-02-26 MED ORDER — SCOPOLAMINE 1 MG/3DAYS TD PT72: 1.0000 | MEDICATED_PATCH | TRANSDERMAL | Status: DC

## 2019-02-26 MED ORDER — PREDNISONE 50 MG PO TABS: 50.0000 mg | ORAL_TABLET | Freq: Four times a day (QID) | ORAL | Status: DC

## 2019-02-26 MED ORDER — POLYETHYLENE GLYCOL 3350 17 G PO PACK
17.0000 g | PACK | Freq: Every day | ORAL | Status: DC
  Administered 2019-02-26: 13:00:00 17 g via ORAL
  Filled 2019-02-26 (×2): qty 1

## 2019-02-26 MED ORDER — HYDROCORTISONE NA SUCCINATE PF 250 MG IJ SOLR: 200.0000 mg | INTRAMUSCULAR | Status: DC

## 2019-02-26 MED ORDER — DIPHENHYDRAMINE HCL 50 MG PO CAPS: 50.0000 mg | ORAL_CAPSULE | Freq: Once | ORAL | Status: DC

## 2019-02-26 MED ORDER — DIPHENHYDRAMINE HCL 50 MG/ML IJ SOLN: 50.0000 mg | Freq: Once | INTRAMUSCULAR | Status: DC

## 2019-02-26 NOTE — Progress Notes (Signed)
   Subjective/Chief Complaint: No bm or flatus yet she says, no n/v, no sob, no real abdominal pain anymore, she feels much better   Objective: Vital signs in last 24 hours: Temp:  [98 F (36.7 C)-98.4 F (36.9 C)] 98 F (36.7 C) (04/14 0457) Pulse Rate:  [73-84] 84 (04/14 0459) Resp:  [16-18] 16 (04/14 0457) BP: (86-111)/(50-63) 93/61 (04/14 0459) SpO2:  [97 %-99 %] 97 % (04/14 0457) Last BM Date: 02/23/19  Intake/Output from previous day: 04/13 0701 - 04/14 0700 In: 742.1 [I.V.:408.7; IV Piggyback:333.5] Out: -  Intake/Output this shift: No intake/output data recorded.  General appearance: alert Resp: clear to auscultation bilaterally Cardio: regular rate and rhythm GI: soft nontender nondistended some bs present Extremities: edema mild  Lab Results:  Recent Labs    02/25/19 0331 02/26/19 0414  WBC 1.3* 2.8*  HGB 8.1* 8.8*  HCT 25.0* 26.6*  PLT 174 149*   BMET Recent Labs    02/25/19 0331 02/26/19 0414  NA 135 136  K 3.0* 2.9*  CL 105 103  CO2 21* 22  GLUCOSE 105* 79  BUN 11 15  CREATININE 0.59 0.61  CALCIUM 8.1* 8.4*   PT/INR Recent Labs    02/23/19 1212  LABPROT 19.5*  INR 1.7*   ABG No results for input(s): PHART, HCO3 in the last 72 hours.  Invalid input(s): PCO2, PO2  Studies/Results: No results found.  Anti-infectives: Anti-infectives (From admission, onward)   Start     Dose/Rate Route Frequency Ordered Stop   02/23/19 2000  piperacillin-tazobactam (ZOSYN) IVPB 3.375 g     3.375 g 12.5 mL/hr over 240 Minutes Intravenous Every 8 hours 02/23/19 1608     02/23/19 1400  piperacillin-tazobactam (ZOSYN) IVPB 3.375 g     3.375 g 100 mL/hr over 30 Minutes Intravenous  Once 02/23/19 1347 02/23/19 1427      Assessment/Plan: Diverticulitis with likely intramural abscess -continue zosyn, she is stable today, if not significant change plan repeat ct scan today -will give her clears today, she appears to be resolving -little bit more  difficult to follow indicators such as wbc due to chemotherapy 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 Follow by Dr. Earlie Server. On chemotx now On warfarin for clot involving port For thrombosis involving her port cath.Agree with holding oral anticoagulation for now in case interventional radiology or surgery needed on Lovenox   Rolm Bookbinder 02/26/2019

## 2019-02-26 NOTE — Progress Notes (Signed)
Brief Oncology Note:  Patient seen this am.  Feels better.  Less abdominal pain.  Denies nausea, vomiting, constipation, diarrhea.  Denies bleeding.  Has remained afebrile.  Received 1 dose of Granix yesterday for an ANC of 0.8.   CBC from today was reviewed.  Total white blood cell count is still low but has improved to 2.8.  ANC is now normal at 2.2.  Hemoglobin and platelets remain stable.  Will discontinue the dose of Granix that was due for later today.  Continue to monitor CBC.  She is scheduled to have a CT of the abdomen and pelvis performed later today as ordered by general surgery.  She remains on Zosyn.  The patient will begin clear liquids later today.  Mikey Bussing, DNP, AGPCNP-BC, AOCNP

## 2019-02-26 NOTE — Progress Notes (Signed)
Orders placed for 13 hour contrast allergy prep per request of Dr. Donne Hazel.   Please call IR with questions or concerns regarding prep.  Candiss Norse, PA-C Pager# [phone]

## 2019-02-26 NOTE — Progress Notes (Signed)
PROGRESS NOTE    Alison Sullivan  XLK:[id number] DOB: 04-15-1951 DOA: 02/23/2019 PCP: [name], MD    Brief Narrative:  68 year old female who presented with diarrhea, nausea, vomiting and abdominal pain for the last 5 days. She does have the significant past medical history for stage IV,high-grade neuroendocrine carcinoma,with bony and lymphatic metastasis,on radiation and chemotherapy. Reported 5 days of abdominal pain, moderate to severe in intensity. On her initial physical examination her blood pressure was 83/59, heart rate 76, respirate 22, oxygen saturation 97%,she had dry mucous membranes, lungs had decreased breath sounds at bases, heart S1-S2 present rhythmic, the abdomen was tender in the left lower quadrant, no lower extremity edema. Sodium was 134, potassium 3.1, chloride 104, bicarb 21, glucose 116, BUN 19, creatinine 0.61, white count 4.2, hemoglobin 9.6, hematocrit 28.8, platelets 235.CT of the abdomen and pelvis showed acute diverticulitis involving the proximal sigmoid colon with evidence of intramural abscess in the wall of the sigmoid colon. No evidence of perforation. EKG 80 bpm, normal intervals, normal axis, sinus rhythm with normal conduction, ST segment depressions with T wave inversions in V2 through V5.  Patient was admitted to the hospital with a working diagnosis of acute diverticulitis complicated by hypokalemia    Assessment & Plan:   Principal Problem:   Diverticulitis of sigmoid colon Active Problems:   Bone metastases ([place])   Abscess of sigmoid colon due to diverticulitis   Neuroendocrine carcinoma of the left lung    Hypokalemia   Thrombus port  A cath  1. Acute proximal sigmoid diverticulitis with intraluminal abscess. Her symptoms have been improving, but not yet back to baseline, diet has been advanced to clear liquids, continue IV antibiotic therapy with Zosyn. Will discontinue IV fluids. Continue pain control with  morphine IV and as needed zofran for nausea. Plan to repeat CT abdomen and pelvis today with IV contrast. Patient has history of intolerance to oral contrast, with abdominal cramps and diarrhea, no true allergy.   2. Metastatic neuroendocrine carcinoma of the left lung. Patient under current treatment with chemotherapy and radiation therapy.  wbc up to 2,8 today, with 2,2 neutrophils, patient did received one dose of Tbo-Filagrastim. Follow with oncology recommendations.   3. Hypokalemia and Hyponatremia. Persistent hypokalemia, today down to 2,9, will continue aggressive K correction with oral Kcl, 80 meq today in 2 divided doses, follow on renal panel in am. Her renal function has remained stable at 0.61, with serum bicarbonate at 22.   4. Anemia due to malignancy. Continue Hgb and Hct stable at 8.8 and 26.6.    5. GERD. Continue po pantoprazole, diet has been advanced to clears today.   6. Non occlusive thrombus of the Port A cath. On enoxaparin therapeutic doses, while hospitalized, at home on warfarin.   DVT prophylaxis:scd  Code Status:full Family Communication:no family at the bedside Disposition Plan/ discharge barriers:pending clinical improvement.   There is no height or weight on file to calculate BMI. Malnutrition Type:      Malnutrition Characteristics:      Nutrition Interventions:     RN Pressure Injury Documentation:     Consultants:   Surgery   Procedures:     Antimicrobials:   Zosyn     Subjective: Patient reports improvement in her abdominal pain, but not back to normal, no nausea or vomiting, no diarrhea. Improved appetite. No dyspnea or chest pain.   Objective: Vitals:   02/25/19 1452 02/25/19 2102 02/26/19 0457 02/26/19 0459  BP: (!) 96/58 111/63 Marland Kitchen)  86/50 93/61  Pulse: 73 77 79 84  Resp: 18 16 16    Temp: 98.4 F (36.9 C) 98.2 F (36.8 C) 98 F (36.7 C)   TempSrc: Oral Oral Oral   SpO2: 98% 99% 97%      Intake/Output Summary (Last 24 hours) at 02/26/2019 1040 Last data filed at 02/26/2019 0400 Gross per 24 hour  Intake 742.13 ml  Output -  Net 742.13 ml   There were no vitals filed for this visit.  Examination:   General: deconditioned  Neurology: Awake and alert, non focal  E ENT: mild pallor, no icterus, oral mucosa moist Cardiovascular: No JVD. S1-S2 present, rhythmic, no gallops, rubs, or murmurs. No lower extremity edema. Pulmonary: positive breath sounds bilaterally, adequate air movement, no wheezing, rhonchi or rales. Gastrointestinal. Abdomen mild distended with no organomegaly, non tender, no rebound or guarding Skin. No rashes Musculoskeletal: no joint deformities     Data Reviewed: I have personally reviewed following labs and imaging studies  CBC: Recent Labs  Lab 02/22/19 1450 02/23/19 1212 02/24/19 0409 02/25/19 0331 02/26/19 0414  WBC 6.5 4.2 2.0* 1.3* 2.8*  NEUTROABS 6.1 3.9 1.5* 0.8* 2.2  HGB 11.3* 9.6* 8.6* 8.1* 8.8*  HCT 33.7* 28.8* 26.6* 25.0* 26.6*  MCV 100.3* 101.8* 103.9* 103.3* 102.7*  PLT 317 235 197 174 829*   Basic Metabolic Panel: Recent Labs  Lab 02/22/19 1450 02/23/19 1212 02/24/19 0409 02/25/19 0331 02/26/19 0414  NA 139 134* 136 135 136  K 3.9 3.1* 3.1* 3.0* 2.9*  CL 103 104 106 105 103  CO2 20* 21* 20* 21* 22  GLUCOSE 111* 116* 113* 105* 79  BUN 20 19 16 11 15   CREATININE 0.79 0.61 0.70 0.59 0.61  CALCIUM 9.0 8.1* 7.9* 8.1* 8.4*  MG 1.8  --   --   --   --    GFR: Estimated Creatinine Clearance: 63.9 mL/min (by C-G formula based on SCr of 0.61 mg/dL). Liver Function Tests: Recent Labs  Lab 02/22/19 1450 02/23/19 1212  AST 21 23  ALT 14 16  ALKPHOS 106 80  BILITOT 0.7 0.9  PROT 7.6 6.7  ALBUMIN 3.4* 3.2*   Recent Labs  Lab 02/23/19 1212  LIPASE 25   No results for input(s): AMMONIA in the last 168 hours. Coagulation Profile: Recent Labs  Lab 02/23/19 1212  INR 1.7*   Cardiac Enzymes: Recent Labs  Lab  02/23/19 1213  TROPONINI <0.03   BNP (last 3 results) No results for input(s): PROBNP in the last 8760 hours. HbA1C: No results for input(s): HGBA1C in the last 72 hours. CBG: No results for input(s): GLUCAP in the last 168 hours. Lipid Profile: No results for input(s): CHOL, HDL, LDLCALC, TRIG, CHOLHDL, LDLDIRECT in the last 72 hours. Thyroid Function Tests: No results for input(s): TSH, T4TOTAL, FREET4, T3FREE, THYROIDAB in the last 72 hours. Anemia Panel: No results for input(s): VITAMINB12, FOLATE, FERRITIN, TIBC, IRON, RETICCTPCT in the last 72 hours.    Radiology Studies: I have reviewed all of the imaging during this hospital visit personally     Scheduled Meds: . chlorhexidine  15 mL Mouth Rinse BID  . diphenhydrAMINE  50 mg Oral Once   Or  . diphenhydrAMINE  50 mg Intravenous Once  . docusate sodium  100 mg Oral BID  . enoxaparin (LOVENOX) injection  1 mg/kg Subcutaneous Q12H  . mouth rinse  15 mL Mouth Rinse q12n4p  . Melatonin  10 mg Oral QHS  . pantoprazole  40 mg  Oral Daily  . polyethylene glycol  17 g Oral Daily  . potassium chloride  40 mEq Oral Once  . predniSONE  50 mg Oral Q6H  . scopolamine  1 patch Transdermal Q72H   Continuous Infusions: . dextrose 5% lactated ringers 75 mL/hr at 02/26/19 0145  . piperacillin-tazobactam (ZOSYN)  IV 3.375 g (02/26/19 0347)     LOS: 3 days         Gerome Apley, MD

## 2019-02-26 NOTE — Progress Notes (Signed)
Patient slightly non-complaint refusing oral rinse, melatonin, and complaining about colace. RN educated patient, colace is a stool softener and not a laxative. Patient stated she had diarrhea episode earlier, in the am. Patient complained about taking potassium pills. RN crush and dissolved in water; patient said it was nasty. RN tried to add it to some pudding per day RN's recommendation and patient still said she couldn't consume it. Patient is asking why potassium is not IV. RN explained to her she can only go home on tablets and not IV meds. Patient said she will talk to the doctor in the morning. Will continue to monitor.

## 2019-02-26 NOTE — Progress Notes (Signed)
Dr. Donne Hazel  Please remove contrast allergy out Alison Sullivan's chart.  She is not allergic to the Iodinated Contrast Media Agent.  She is just not able to tolerate the oral contrast per pt. This has to be removed by a physician.  Thank you.

## 2019-02-26 NOTE — Progress Notes (Signed)
ANTICOAGULATION CONSULT NOTE - Follow Up Consult  Pharmacy Consult for Enoxaparin Indication: VTE treatment  Allergies  Allergen Reactions  . Contrast Media [Iodinated Diagnostic Agents] Diarrhea and Other (See Comments)    Stomach cramps - can take IV not oral liquid    Patient Measurements:    Height: 5\' 6"  Weight: 65.8 kg   Vital Signs: Temp: 98 F (36.7 C) (04/14 0457) Temp Source: Oral (04/14 0457) BP: 93/61 (04/14 0459) Pulse Rate: 84 (04/14 0459)  Labs: Recent Labs    02/23/19 1212 02/23/19 1213 02/24/19 0409 02/25/19 0331 02/26/19 0414  HGB 9.6*  --  8.6* 8.1* 8.8*  HCT 28.8*  --  26.6* 25.0* 26.6*  PLT 235  --  197 174 149*  LABPROT 19.5*  --   --   --   --   INR 1.7*  --   --   --   --   CREATININE 0.61  --  0.70 0.59 0.61  TROPONINI  --  <0.03  --   --   --     Estimated Creatinine Clearance: 63.9 mL/min (by C-G formula based on SCr of 0.61 mg/dL).   Medical History: Past Medical History:  Diagnosis Date  . Arthritis   . Chronic kidney disease   . Dog bite of wrist   . History of kidney stones   . History of radiation therapy 10/23/18- 11/06/18   Left lower rib mass/ 30 Gy delivered in 10 fractions 3 Gy.   . Kidney infection   . Left renal mass   . Osteopenia   . Pyelonephritis   . 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 Venous duplex of the lower extremities negative for DVT. Warfarin held on admission. Pharmacy consulted for enoxaparin dosing. INR = 1.7. Hgb 9.6, Pltc WNL. SCr 0.61 with CrCl ~ 64 ml/min.   Today, 02/26/2019 - SCr 0.61, CrCl ~63 ml/min - CBC: Hgb 8.8, Plts 149, low but stable - No bleeding reported  Goal of Therapy:   Anti-Xa level 0.6-1 units/ml 4hrs after LMWH dose given Monitor platelets by anticoagulation protocol: Yes   Plan:   Continue Enoxaparin 1mg /kg SQ q12h  Monitor renal function  Monitor CBC at least q72h  Watch for s/sx of bleeding   Peggyann Juba, PharmD, BCPS Pager: [phone] 02/23/2019 5:17 PM

## 2019-02-26 NOTE — Care Management Important Message (Signed)
Important Message  Patient Details IM Letter given to the Case Manager to present to the Patient Name: Alison Sullivan MRN: [id number] Date of Birth: 13-Feb-1951   Medicare Important Message Given:  Yes    Kerin Salen 02/26/2019, 11:06 AMImportant Message  Patient Details  Name: Alison Sullivan MRN: [id number] Date of Birth: 05/04/1951   Medicare Important Message Given:  Yes    Kerin Salen 02/26/2019, 11:06 AM

## 2019-02-27 LAB — CBC WITH DIFFERENTIAL/PLATELET
Abs Immature Granulocytes: 0.03 10*3/uL (ref 0.00–0.07)
Basophils Absolute: 0 10*3/uL (ref 0.0–0.1)
Basophils Relative: 0 %
Eosinophils Absolute: 0 10*3/uL (ref 0.0–0.5)
Eosinophils Relative: 0 %
HCT: 25.3 % — ABNORMAL LOW (ref 36.0–46.0)
Hemoglobin: 8.2 g/dL — ABNORMAL LOW (ref 12.0–15.0)
Immature Granulocytes: 1 %
Lymphocytes Relative: 11 %
Lymphs Abs: 0.4 10*3/uL — ABNORMAL LOW (ref 0.7–4.0)
MCH: 32.9 pg (ref 26.0–34.0)
MCHC: 32.4 g/dL (ref 30.0–36.0)
MCV: 101.6 fL — ABNORMAL HIGH (ref 80.0–100.0)
Monocytes Absolute: 0.4 10*3/uL (ref 0.1–1.0)
Monocytes Relative: 13 %
Neutro Abs: 2.5 10*3/uL (ref 1.7–7.7)
Neutrophils Relative %: 75 %
Platelets: 124 10*3/uL — ABNORMAL LOW (ref 150–400)
RBC: 2.49 MIL/uL — ABNORMAL LOW (ref 3.87–5.11)
RDW: 16.9 % — ABNORMAL HIGH (ref 11.5–15.5)
WBC: 3.4 10*3/uL — ABNORMAL LOW (ref 4.0–10.5)
nRBC: 0 % (ref 0.0–0.2)

## 2019-02-27 LAB — BASIC METABOLIC PANEL
Anion gap: 8 (ref 5–15)
BUN: 12 mg/dL (ref 8–23)
CO2: 23 mmol/L (ref 22–32)
Calcium: 8.1 mg/dL — ABNORMAL LOW (ref 8.9–10.3)
Chloride: 105 mmol/L (ref 98–111)
Creatinine, Ser: 0.62 mg/dL (ref 0.44–1.00)
GFR calc Af Amer: >60 mL/min (ref >60)
GFR calc non Af Amer: >60 mL/min (ref >60)
Glucose, Bld: 99 mg/dL (ref 70–99)
Potassium: 2.8 mmol/L — ABNORMAL LOW (ref 3.5–5.1)
Sodium: 136 mmol/L (ref 135–145)

## 2019-02-27 LAB — MAGNESIUM: Magnesium: 1.7 mg/dL (ref 1.7–2.4)

## 2019-02-27 MED ORDER — HEPARIN SOD (PORK) LOCK FLUSH 100 UNIT/ML IV SOLN
500.0000 [IU] | INTRAVENOUS | Status: AC | PRN
  Administered 2019-02-27: 500 [IU]
  Filled 2019-02-27: qty 5

## 2019-02-27 MED ORDER — POTASSIUM CHLORIDE 40 MEQ/15ML (20%) PO SOLN: 40.0000 meq | Freq: Every day | ORAL | 0 refills | Status: DC

## 2019-02-27 MED ORDER — POTASSIUM CHLORIDE ER 10 MEQ PO TBCR: 40.0000 meq | EXTENDED_RELEASE_TABLET | Freq: Every day | ORAL | 0 refills | Status: DC

## 2019-02-27 MED ORDER — POTASSIUM CHLORIDE CRYS ER 10 MEQ PO TBCR
40.0000 meq | EXTENDED_RELEASE_TABLET | Freq: Three times a day (TID) | ORAL | Status: DC
  Filled 2019-02-27: qty 4
  Filled 2019-02-27: qty 2

## 2019-02-27 MED ORDER — MOXIFLOXACIN HCL 400 MG PO TABS: 400.0000 mg | ORAL_TABLET | Freq: Every day | ORAL | 0 refills | Status: DC

## 2019-02-27 NOTE — Plan of Care (Signed)
Nutrition Education Note  **RD working remotely**  RD consulted for nutrition education regarding nutrition management for diverticulosis/ Diverticulitis.    Spoke with patient over the phone and answered all of patients questions at this time. Pt agreed to RD emailing the following resources.  RD provided "Fiber Restricted Nutrition Therapy" handout from the [employer]. Reviewed patient's dietary recall and discussed ways for pt to meet nutrition goals over the next several weeks. Explained reasons for pt to follow a low fiber diet over the next several weeks. Reviewed low fiber foods and high fiber foods. Discussed best practice for long term management of diverticulosis is a high fiber diet and discussed ways to gradually increase fiber in the diet.   Teach back method used. Pt verbalizes understanding of information provided.   Expect good compliance. Patient to email with any follow-up questions. 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  Body mass index is 23.4 kg/m^2. Pt meets criteria for normal based on current BMI.  Current diet order is soft. Labs and medications reviewed. No further nutrition interventions warranted at this time. RD contact information provided. If additional nutrition issues arise, please re-consult RD.  Clayton Bibles, MS, RD, [place] [facility] Pager: [phone] After Hours Pager: [phone]

## 2019-02-27 NOTE — TOC Transition Note (Signed)
Transition of Care [facility]) - CM/SW Discharge Note   Patient Details  Name: Alison Sullivan MRN: [id number] Date of Birth: 15-Feb-1951  [facility] CM/SW Contact:  Leeroy Cha, RN Phone Number: 02/27/2019, 12:35 PM   Clinical Narrative:    Discharged to home   Final next level of care: Home/Self Care Barriers to Discharge: No Barriers Identified   Patient Goals and CMS Choice Patient states their goals for this hospitalization and ongoing recovery are:: to [facility]      Discharge Placement                       Discharge Plan and Services   Discharge Planning Services: CM Consult                      Social Determinants of Health (SDOH) Interventions     Readmission Risk Interventions No flowsheet data found.

## 2019-02-27 NOTE — Discharge Summary (Signed)
Physician Discharge Summary  Alison Sullivan GYI:[id number] DOB: 24-Oct-1951 DOA: 02/23/2019  PCP: [name], MD  Admit date: 02/23/2019 Discharge date: 02/27/2019  Admitted From: Home Disposition: Home  Recommendations for Outpatient Follow-up:  1. Follow up with PCP in 1-2 weeks 2. Please obtain BMP in 1-2 weeks, eval potassium level 3. Follow-up with general surgery outpatient as scheduled  Home Health: No Equipment/Devices: None  Discharge Condition: Stable CODE STATUS: Full code Diet recommendation: Regular diet  History of present illness:  68 year old female who presented with diarrhea, nausea, vomiting and abdominal pain for the last 5 days. She does have the significant past medical history for stage IV,high-grade neuroendocrine carcinoma,with bony and lymphatic metastasis,on radiation and chemotherapy. Reported 5 days of abdominal pain, moderate to severe in intensity. On her initial physical examination her blood pressure was 83/59, heart rate 76, respirate 22, oxygen saturation 97%,she had dry mucous membranes, lungs had decreased breath sounds at bases, heart S1-S2 present rhythmic, the abdomen was tender in the left lower quadrant, no lower extremity edema. Sodium was 134, potassium 3.1, chloride 104, bicarb 21, glucose 116, BUN 19, creatinine 0.61, white count 4.2, hemoglobin 9.6, hematocrit 28.8, platelets 235.CT of the abdomen and pelvis showed acute diverticulitis involving the proximal sigmoid colon with evidence of intramural abscess in the wall of the sigmoid colon. No evidence of perforation. EKG 80 bpm, normal intervals, normal axis, sinus rhythm with normal conduction, ST segment depressions with T wave inversions in V2 through V5.  Patient was admitted to the hospital with a working diagnosis of acute diverticulitis complicated by hypokalemia  Hospital course:  Acute proximal sigmoid diverticulitis with intraluminal abscess. CT  abdomen/pelvis on admission notable for acute diverticulitis involving the proximal sigmoid colon with evidence of intramural abscess in the wall of the sigmoid colon.  Patient was initially started on IV Zosyn.  General surgery followed during the hospital course.  No surgical intervention was necessary.  Patient will transition from IV Zosyn to Avelox 400 mg p.o. daily for an additional 10 days per general surgery.  Tolerating diet.  We will follow-up with general surgery outpatient as scheduled.  Metastatic neuroendocrine carcinoma of the left lung.  Patient under current treatment with chemotherapy and radiation therapy.Patient did received one dose of Tbo-Filagrastim. Follow up with oncology outpatient.  Hypokalemia and Hyponatremia. Etiology likely poor oral intake given her diverticulitis with intra-abdominal abscess.  Now tolerating diet.  Potassium was replaced during hospitalization.  We will continue potassium supplementation 40 mEq daily for an additional 4 days following hospitalization.  Recommend PCP repeat BMP in 1-2 weeks.  Anemia due to malignancy. Hgb and Hct stable at 8.8 and 26.6.   GERD: Continue PPI  Non occlusive thrombus of the Port A cath. Home warfarin was held in anticipation of possible surgical intervention.  Plan to resume home warfarin on discharge.   Discharge Diagnoses:  Principal Problem:   Diverticulitis of sigmoid colon Active Problems:   Bone metastases ([place])   Abscess of sigmoid colon due to diverticulitis   Neuroendocrine carcinoma of the left lung    Thrombus port  A cath    Discharge Instructions  Discharge Instructions    Call MD for:  persistant dizziness or light-headedness   Complete by:  As directed    Call MD for:  persistant nausea and vomiting   Complete by:  As directed    Call MD for:  severe uncontrolled pain   Complete by:  As directed    Call MD  for:  temperature >100.4   Complete by:  As directed    Diet - low  sodium heart healthy   Complete by:  As directed    Increase activity slowly   Complete by:  As directed      Allergies as of 02/27/2019      Reactions   Contrast Media [iodinated Diagnostic Agents] Diarrhea, Other (See Comments)   Stomach cramps - can take IV not oral liquid      Medication List    STOP taking these medications   sucralfate 1 g tablet Commonly known as:  CARAFATE     TAKE these medications   folic acid 1 MG tablet Commonly known as:  FOLVITE Take 1 tablet (1 mg total) by mouth daily. What changed:  when to take this   gabapentin 300 MG capsule Commonly known as:  NEURONTIN Take 1 capsule (300 mg total) by mouth 3 (three) times daily.   lidocaine 2 % solution Commonly known as:  XYLOCAINE Use as directed 5 mLs in the mouth or throat every 3 (three) hours as needed for mouth pain. May mix with Mylanta before taking if desired.   lidocaine-prilocaine cream Commonly known as:  EMLA Apply 1 application topically as needed. What changed:  reasons to take this   Melatonin 10 MG Tabs Take 10 mg by mouth at bedtime.   moxifloxacin 400 MG tablet Commonly known as:  AVELOX Take 1 tablet (400 mg total) by mouth daily at 8 pm.   omeprazole 40 MG capsule Commonly known as:  PRILOSEC Take 1 capsule (40 mg total) by mouth daily. What changed:  when to take this   oxyCODONE 5 MG immediate release tablet Commonly known as:  Oxy IR/ROXICODONE Take 1-2 tablets (5-10 mg total) by mouth every 4 (four) hours as needed for severe pain.   potassium chloride 10 MEQ tablet Commonly known as:  K-DUR Take 4 tablets (40 mEq total) by mouth daily for 4 days.   prochlorperazine 10 MG tablet Commonly known as:  COMPAZINE Take 1 tablet (10 mg total) by mouth every 6 (six) hours as needed for nausea or vomiting.   scopolamine 1 MG/3DAYS Commonly known as:  TRANSDERM-SCOP Place 1 patch (1.5 mg total) onto the skin every 3 (three) days.   simvastatin 20 MG  tablet Commonly known as:  ZOCOR Take 1 tablet (20 mg total) by mouth daily at 6 PM. Need annual appointment for further refills What changed:    when to take this  additional instructions   warfarin 5 MG tablet Commonly known as:  COUMADIN TAKE 1 TABLET(5 MG) BY MOUTH DAILY What changed:  See the new instructions.      Follow-up Information    Rolm Bookbinder, MD Follow up in 2 week(s).   Specialty:  General Surgery Contact information: [address] [place] [address] [phone]        [name], MD. Call in 1 week(s).   Specialty:  Internal Medicine Contact information: [place] [address] [phone]          Allergies  Allergen Reactions  . Contrast Media [Iodinated Diagnostic Agents] Diarrhea and Other (See Comments)    Stomach cramps - can take IV not oral liquid    Consultations:  General surgery, Dr. Rolm Bookbinder   Procedures/Studies: Ct Chest W Contrast  Result Date: 02/15/2019 CLINICAL DATA:  Stage IV left upper lobe high-grade neuroendocrine lung carcinoma with small and large cell  features diagnosed November 2019. interval chemotherapy and immunotherapy. Restaging. Palliative radiation therapy to thoracic spine and left rib lesions completed 02/08/2019. EXAM: CT CHEST, ABDOMEN, AND PELVIS WITH CONTRAST TECHNIQUE: Multidetector CT imaging of the chest, abdomen and pelvis was performed following the standard protocol during bolus administration of intravenous contrast. CONTRAST:  152mL OMNIPAQUE IOHEXOL 300 MG/ML  SOLN COMPARISON:  12/03/2018 CT chest, abdomen and pelvis. FINDINGS: CT CHEST FINDINGS Cardiovascular: Normal heart size. No significant pericardial effusion/thickening. Right internal jugular Port-A-Cath terminates in the lower third of the SVC. Atherosclerotic nonaneurysmal thoracic aorta. Normal caliber pulmonary arteries. No central pulmonary emboli. Mediastinum/Nodes: No discrete  thyroid nodules. Unremarkable esophagus. No axillary adenopathy. Bulky 3.6 x 2.9 cm left pericardiophrenic mass (series 2/image 40), increased from 2.9 x 2.3 cm. Enlarged 1.9 cm short axis left prevascular node (series 2/image 17), increased from 1.4 cm using similar measurement technique. No hilar adenopathy. Lungs/Pleura: No pneumothorax. No right pleural effusion. New trace dependent left pleural effusion. Far inferior plaque-like posterior left pleural lesion measures 0.7 cm thickness, previously 0.8 cm, not appreciably changed (series 7/image 15). Subsolid 4.1 x 2.4 cm medial superior segment right lower lobe mass (series 6/image 51), previously 4.1 x 2.4 cm using similar measurement technique, stable. Thick irregular bandlike focus of consolidation with surrounding patchy ground-glass opacity in the right upper lobe is unchanged. Solid 2.1 x 1.7 cm left upper lobe pulmonary nodule (series 6/image 53), previously 2.4 x 1.7 cm, slightly decreased. No new significant pulmonary nodules. No acute consolidative airspace disease. Musculoskeletal: Left posterior tenth rib expansile lesion measures 2.2 cm thickness, mildly increased from 1.8 cm, with associated healed pathologic fracture. Increased sclerosis within the sclerotic ill-defined T10 vertebral body lesion extending into the right pedicle. No new focal osseous lesions in the chest. Minimal thoracic spondylosis. CT ABDOMEN PELVIS FINDINGS Hepatobiliary: Normal liver size. Several tiny subcentimeter hypodense lesions scattered throughout the liver, too small to characterize, unchanged. No new liver lesions. Normal gallbladder with no radiopaque cholelithiasis. No biliary ductal dilatation. Pancreas: Normal, with no mass or duct dilation. Spleen: Normal size. No mass. Adrenals/Urinary Tract: Normal adrenals. New 3 mm stone at the right ureterovesical junction. Minimal right hydroureteronephrosis, minimally increased from prior. Increased urothelial wall  thickening and enhancement throughout the right renal collecting system and right ureter. Several nonobstructing stones in the kidneys bilaterally, largest 8 mm in the lower right kidney and 5 mm in the lower left kidney. No overt left hydronephrosis. Hypodense 2.4 cm lateral interpolar right renal cortical lesion is stable in size. Numerous subcentimeter hypodense renal cortical lesions throughout both kidneys are too small to characterize and are not appreciably changed. Nondistended bladder with suggestion of new mild bladder wall thickening and mucosal hyperenhancement. Stomach/Bowel: Normal non-distended stomach. Normal caliber small bowel with no small bowel wall thickening. Appendix not discretely visualized. Marked sigmoid diverticulosis. Similar chronic mild wall thickening in the sigmoid colon without acute pericolonic fat stranding. Vascular/Lymphatic: Atherosclerotic nonaneurysmal abdominal aorta. Patent portal, splenic, hepatic and renal veins. No pathologically enlarged lymph nodes in the abdomen or pelvis. Reproductive: Grossly normal uterus. Stable simple benign appearing 1.2 cm right adnexal cyst. No left adnexal mass. Other: No pneumoperitoneum, ascites or focal fluid collection. Musculoskeletal: No aggressive appearing focal osseous lesions. Moderate lower lumbar spondylosis. IMPRESSION: 1. New 3 mm stone at the right ureterovesical junction, with minimal right hydroureteronephrosis, slightly increased from prior. 2. New mild diffuse bladder wall thickening and mucosal hyperenhancement. New urothelial wall thickening and hyperenhancement throughout the right renal collecting system and right  ureter. Findings suggest acute cystitis with ascending right urinary tract infection. Suggest correlation with urinalysis. 3. Bulky left pericardiophrenic and left prevascular mediastinal nodal metastases have increased. 4. New trace dependent left pleural effusion. 5. Left upper lobe solid pulmonary nodule is  slightly decreased. Superior segment right lower lobe subsolid lung mass is stable. 6. Increased sclerosis within the T10 spinal metastasis, nonspecific, potentially due to treatment change. Left posterior tenth rib expansile metastasis is slightly increased in thickness. Far inferior plaque-like pleural lesion adjacent to the left twelfth rib is stable. No new osseous lesions. 7. No evidence of metastatic disease in the abdomen or pelvis. 8. Marked sigmoid diverticulosis with chronic mild sigmoid wall thickening, favoring chronic diverticulosis. 9.  Aortic Atherosclerosis (ICD10-I70.0). These results will be called to the ordering clinician or representative by the Radiologist Assistant, and communication documented in the PACS or zVision Dashboard. Electronically Signed   By: Ilona Sorrel M.D.   On: 02/15/2019 16:13   Ct Abdomen Pelvis W Contrast  Result Date: 02/26/2019 CLINICAL DATA:  Follow-up diverticulitis EXAM: CT ABDOMEN AND PELVIS WITH CONTRAST TECHNIQUE: Multidetector CT imaging of the abdomen and pelvis was performed using the standard protocol following bolus administration of intravenous contrast. CONTRAST:  182mL OMNIPAQUE IOHEXOL 300 MG/ML  SOLN COMPARISON:  CT abdomen pelvis, 02/23/2019 FINDINGS: Lower chest: No acute abnormality. Redemonstrated necrotic epicardial lymph node about the cardiac apex (series 2, image 16). Hepatobiliary: No focal liver abnormality is seen. No gallstones, gallbladder wall thickening, or biliary dilatation. Pancreas: Unremarkable. No pancreatic ductal dilatation or surrounding inflammatory changes. Spleen: Normal in size without focal abnormality. Adrenals/Urinary Tract: Adrenal glands are unremarkable. Numerous nonobstructive small bilateral renal calculi. Bladder is unremarkable. Stomach/Bowel: Stomach is within normal limits. Appendix appears normal. Severe sigmoid diverticulosis with mild thickening of the proximal to mid sigmoid wall, slightly improved, and a  redemonstrated intramural abscess at the left aspect of the mid sigmoid (series 2, image 76). This is not significantly changed compared to prior examination and measures approximately 1.8 cm. Vascular/Lymphatic: Severe mixed calcific atherosclerosis of the abdominal aorta and branch vasculature no enlarged abdominal or pelvic lymph nodes. Reproductive: No mass or other abnormality. Other: No abdominal wall hernia or abnormality. No abdominopelvic ascites. Musculoskeletal: No acute osseous findings. Redemonstrated sclerotic lesion of the T10 vertebral body and soft tissue lesion of the left tenth rib. IMPRESSION: 1. Severe sigmoid diverticulosis with mild thickening of the proximal to mid sigmoid wall, slightly improved, and a redemonstrated intramural abscess at the left aspect of the mid sigmoid (series 2, image 76). This is not significantly changed compared to prior examination and measures approximately 1.8 cm. 2. Other chronic and incidental findings as detailed above, including known metastatic neuroendocrine malignancy. Electronically Signed   By: Eddie Candle M.D.   On: 02/26/2019 12:55   Ct Abdomen Pelvis W Contrast  Result Date: 02/23/2019 CLINICAL DATA:  68 year old presenting with acute onset of LEFT LOWER abdominal pain and nausea/vomiting that began yesterday. Current history of urinary tract calculi. 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 EXAM: CT ABDOMEN AND PELVIS WITH CONTRAST TECHNIQUE: Multidetector CT imaging of the abdomen and pelvis was performed using the standard protocol following bolus administration of intravenous contrast. CONTRAST:  161mL OMNIPAQUE IOHEXOL 300 MG/ML IV. COMPARISON:  02/15/2019 and earlier, including PET-CT 09/18/2018. FINDINGS: Lower chest: Visualized  lung bases clear apart from the expected dependent atelectasis posteriorly in the lower lobes. Stable mild BILATERAL lower lobe bronchiectasis. Stable normal heart size. Necrotic LEFT pericardiophrenic lymph node measuring approximately 2.6 x 3.3 x 3.6 cm, slightly increased in size since the January, 2020 CT. Hepatobiliary: Multiple sub-5 mm low-attenuation lesions throughout both lobes of the liver, unchanged since the January, 2020 examination. No new or enlarging liver lesions. Gallbladder normal in appearance without calcified gallstones. No biliary ductal dilation. Pancreas: Normal in appearance without evidence of mass, ductal dilation, or inflammation. Spleen: Normal in size and appearance. Adrenals/Urinary Tract: Normal appearing adrenal glands. Severe scarring involving both kidneys. BILATERAL nephrolithiasis, the largest stone measuring approximately 1 cm in a LOWER pole calyx of the RIGHT kidney. No obstructing ureteral calculi on either side. Cortical cysts and calyceal diverticula involving both kidneys. No convincing solid masses involving either kidney. Large extrarenal pelvis on the LEFT and small extrarenal pelvis on the RIGHT. Normal appearing decompressed urinary bladder. Stomach/Bowel: Stomach normal in appearance for the degree of distention. Normal-appearing small bowel. Entire colon relatively decompressed. Mild wall thickening diffusely throughout the decompressed colon. Distal descending and sigmoid colon diverticulosis without evidence of acute diverticulitis involving the proximal sigmoid colon in the LEFT side of the mid pelvis. Complex fluid collection in this location which is likely intramural. No evidence of extraluminal gas. Appendix not clearly demonstrated, but no pericecal inflammation. Vascular/Lymphatic: Severe aortoiliofemoral atherosclerosis with calcified and noncalcified plaque involving the abdominal aorta. Possible filling defects involving the BILATERAL iliofemoral  veins, though this may be artifactual and related to incomplete opacification. No pathologic lymphadenopathy. Reproductive: Normal appearing atrophic uterus consistent with patient age. No adnexal masses. Other: None. Musculoskeletal: LEFT posterolateral tenth rib metastasis measuring approximately 3.8 x 1.9 cm, decreased in size since the prior PET-CT after radiation treatment. Mixed lytic and sclerotic metastasis involving the T10 vertebral body and the RIGHT pedicle of T10, more conspicuous than on the January, 2020 examination. No new osseous metastases elsewhere. IMPRESSION: 1. Acute diverticulitis involving the proximal sigmoid colon with evidence of an intramural abscess in the wall of the sigmoid colon. No evidence of perforation. 2. Enlarging necrotic left pericardiophrenic lymph node since the January, 2020 CT. 3. Stable sub-5 mm liver lesions which are too small to characterize by CT. 4. Possible bilateral iliofemoral DVT, though this may be artifactual and related to incomplete opacification. Does the patient have clinical symptoms of BILATERAL LOWER extremity edema? If so, LOWER extremity venous Doppler ultrasound may be helpful to confirm a benign this finding. 5. Osseous metastasis involving the T10 vertebral body and the right pedicle of T10, more conspicuous than in January, 2020. Interval decrease in size of a LEFT tenth rib metastasis after treatment with radiation therapy. No new osseous metastases. 6. BILATERAL nephrolithiasis. No obstructing ureteral calculi. Severe scarring involving both kidneys. 7.  Aortic Atherosclerosis, severe.  (ICD10-170.0) Electronically Signed   By: Evangeline Dakin M.D.   On: 02/23/2019 13:40   Ct Abdomen Pelvis W Contrast  Result Date: 02/15/2019 CLINICAL DATA:  Stage IV left upper lobe high-grade neuroendocrine lung carcinoma with small and large cell features diagnosed November 2019. interval chemotherapy and immunotherapy. Restaging. Palliative radiation  therapy to thoracic spine and left rib lesions completed 02/08/2019. EXAM: CT CHEST, ABDOMEN, AND PELVIS WITH CONTRAST TECHNIQUE: Multidetector CT imaging of the chest, abdomen and pelvis was performed following the standard protocol during bolus administration of intravenous contrast. CONTRAST:  148mL OMNIPAQUE IOHEXOL 300 MG/ML  SOLN COMPARISON:  12/03/2018 CT chest,  abdomen and pelvis. FINDINGS: CT CHEST FINDINGS Cardiovascular: Normal heart size. No significant pericardial effusion/thickening. Right internal jugular Port-A-Cath terminates in the lower third of the SVC. Atherosclerotic nonaneurysmal thoracic aorta. Normal caliber pulmonary arteries. No central pulmonary emboli. Mediastinum/Nodes: No discrete thyroid nodules. Unremarkable esophagus. No axillary adenopathy. Bulky 3.6 x 2.9 cm left pericardiophrenic mass (series 2/image 40), increased from 2.9 x 2.3 cm. Enlarged 1.9 cm short axis left prevascular node (series 2/image 17), increased from 1.4 cm using similar measurement technique. No hilar adenopathy. Lungs/Pleura: No pneumothorax. No right pleural effusion. New trace dependent left pleural effusion. Far inferior plaque-like posterior left pleural lesion measures 0.7 cm thickness, previously 0.8 cm, not appreciably changed (series 7/image 15). Subsolid 4.1 x 2.4 cm medial superior segment right lower lobe mass (series 6/image 51), previously 4.1 x 2.4 cm using similar measurement technique, stable. Thick irregular bandlike focus of consolidation with surrounding patchy ground-glass opacity in the right upper lobe is unchanged. Solid 2.1 x 1.7 cm left upper lobe pulmonary nodule (series 6/image 53), previously 2.4 x 1.7 cm, slightly decreased. No new significant pulmonary nodules. No acute consolidative airspace disease. Musculoskeletal: Left posterior tenth rib expansile lesion measures 2.2 cm thickness, mildly increased from 1.8 cm, with associated healed pathologic fracture. Increased sclerosis  within the sclerotic ill-defined T10 vertebral body lesion extending into the right pedicle. No new focal osseous lesions in the chest. Minimal thoracic spondylosis. CT ABDOMEN PELVIS FINDINGS Hepatobiliary: Normal liver size. Several tiny subcentimeter hypodense lesions scattered throughout the liver, too small to characterize, unchanged. No new liver lesions. Normal gallbladder with no radiopaque cholelithiasis. No biliary ductal dilatation. Pancreas: Normal, with no mass or duct dilation. Spleen: Normal size. No mass. Adrenals/Urinary Tract: Normal adrenals. New 3 mm stone at the right ureterovesical junction. Minimal right hydroureteronephrosis, minimally increased from prior. Increased urothelial wall thickening and enhancement throughout the right renal collecting system and right ureter. Several nonobstructing stones in the kidneys bilaterally, largest 8 mm in the lower right kidney and 5 mm in the lower left kidney. No overt left hydronephrosis. Hypodense 2.4 cm lateral interpolar right renal cortical lesion is stable in size. Numerous subcentimeter hypodense renal cortical lesions throughout both kidneys are too small to characterize and are not appreciably changed. Nondistended bladder with suggestion of new mild bladder wall thickening and mucosal hyperenhancement. Stomach/Bowel: Normal non-distended stomach. Normal caliber small bowel with no small bowel wall thickening. Appendix not discretely visualized. Marked sigmoid diverticulosis. Similar chronic mild wall thickening in the sigmoid colon without acute pericolonic fat stranding. Vascular/Lymphatic: Atherosclerotic nonaneurysmal abdominal aorta. Patent portal, splenic, hepatic and renal veins. No pathologically enlarged lymph nodes in the abdomen or pelvis. Reproductive: Grossly normal uterus. Stable simple benign appearing 1.2 cm right adnexal cyst. No left adnexal mass. Other: No pneumoperitoneum, ascites or focal fluid collection. Musculoskeletal:  No aggressive appearing focal osseous lesions. Moderate lower lumbar spondylosis. IMPRESSION: 1. New 3 mm stone at the right ureterovesical junction, with minimal right hydroureteronephrosis, slightly increased from prior. 2. New mild diffuse bladder wall thickening and mucosal hyperenhancement. New urothelial wall thickening and hyperenhancement throughout the right renal collecting system and right ureter. Findings suggest acute cystitis with ascending right urinary tract infection. Suggest correlation with urinalysis. 3. Bulky left pericardiophrenic and left prevascular mediastinal nodal metastases have increased. 4. New trace dependent left pleural effusion. 5. Left upper lobe solid pulmonary nodule is slightly decreased. Superior segment right lower lobe subsolid lung mass is stable. 6. Increased sclerosis within the T10 spinal metastasis, nonspecific, potentially due  to treatment change. Left posterior tenth rib expansile metastasis is slightly increased in thickness. Far inferior plaque-like pleural lesion adjacent to the left twelfth rib is stable. No new osseous lesions. 7. No evidence of metastatic disease in the abdomen or pelvis. 8. Marked sigmoid diverticulosis with chronic mild sigmoid wall thickening, favoring chronic diverticulosis. 9.  Aortic Atherosclerosis (ICD10-I70.0). These results will be called to the ordering clinician or representative by the Radiologist Assistant, and communication documented in the PACS or zVision Dashboard. Electronically Signed   By: Ilona Sorrel M.D.   On: 02/15/2019 16:13   Vas [place] Lower Extremity Venous (dvt) (only [facility] 7a-7p)  Result Date: 02/25/2019  Lower Venous Study Indications: Possible iliac thrombosis vs. artifact by CT. No lower extremity edema.  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 Presenting with acute diverticulitis involving the proximal sigmoid colon with evidence of intramural abscess in the wall of the  sigmoid colon. Comparison Study: No prior study on file for comparison. Performing Technologist: Sharion Dove RVS  Examination Guidelines: A complete evaluation includes B-mode imaging, spectral Doppler, color Doppler, and power Doppler as needed of all accessible portions of each vessel. Bilateral testing is considered an integral part of a complete examination. Limited examinations for reoccurring indications may be performed as noted.  Right Venous Findings: +---------+---------------+---------+-----------+----------+-------+          CompressibilityPhasicitySpontaneityPropertiesSummary +---------+---------------+---------+-----------+----------+-------+ CFV      Full           Yes      Yes                          +---------+---------------+---------+-----------+----------+-------+ SFJ      Full                                                 +---------+---------------+---------+-----------+----------+-------+ FV Prox  Full                                                 +---------+---------------+---------+-----------+----------+-------+ FV Mid   Full                                                 +---------+---------------+---------+-----------+----------+-------+ FV DistalFull                                                 +---------+---------------+---------+-----------+----------+-------+ PFV      Full                                                 +---------+---------------+---------+-----------+----------+-------+ POP      Full           Yes      Yes                          +---------+---------------+---------+-----------+----------+-------+  PTV      Full                                                 +---------+---------------+---------+-----------+----------+-------+ PERO     Full                                                 +---------+---------------+---------+-----------+----------+-------+  Left Venous Findings:  +---------+---------------+---------+-----------+----------+-------+          CompressibilityPhasicitySpontaneityPropertiesSummary +---------+---------------+---------+-----------+----------+-------+ CFV      Full           Yes      Yes                          +---------+---------------+---------+-----------+----------+-------+ SFJ      Full                                                 +---------+---------------+---------+-----------+----------+-------+ FV Prox  Full                                                 +---------+---------------+---------+-----------+----------+-------+ FV Mid   Full                                                 +---------+---------------+---------+-----------+----------+-------+ FV DistalFull                                                 +---------+---------------+---------+-----------+----------+-------+ PFV      Full                                                 +---------+---------------+---------+-----------+----------+-------+ POP      Full           Yes      Yes                          +---------+---------------+---------+-----------+----------+-------+ PTV      Full                                                 +---------+---------------+---------+-----------+----------+-------+ PERO     Full                                                 +---------+---------------+---------+-----------+----------+-------+  Summary: Right: There is no evidence of deep vein thrombosis in the lower extremity. Left: There is no evidence of deep vein thrombosis in the lower extremity.  *See table(s) above for measurements and observations. Electronically signed by Monica Martinez MD on 02/25/2019 at 5:13:20 PM.    Final      Subjective: Patient seen and examined at bedside,.  Tolerating diet.  Abdominal pain has resolved.  Seen by general surgery this morning and okay for discharge home on oral antibiotics.  No  other complaints or concerns at this time.  Denies headache, no chest pain, no palpitations, no shortness of breath, no abdominal pain, no weakness, no issues with bowel/bladder function.  No acute events overnight per nursing staff.   Discharge Exam: Vitals:   02/26/19 2028 02/27/19 0435  BP: 99/67 107/60  Pulse: 79 68  Resp: 16 16  Temp: 98 F (36.7 C) (!) 97.4 F (36.3 C)  SpO2: 97% 97%   Vitals:   02/26/19 0459 02/26/19 1436 02/26/19 2028 02/27/19 0435  BP: 93/61 101/66 99/67 107/60  Pulse: 84 92 79 68  Resp:  16 16 16   Temp:  98 F (36.7 C) 98 F (36.7 C) (!) 97.4 F (36.3 C)  TempSrc:  Oral    SpO2:  100% 97% 97%    General: Pt is alert, awake, not in acute distress Cardiovascular: RRR, S1/S2 +, no rubs, no gallops Respiratory: CTA bilaterally, no wheezing, no rhonchi Abdominal: Soft, NT, ND, bowel sounds + Extremities: no edema, no cyanosis    The results of significant diagnostics from this hospitalization (including imaging, microbiology, ancillary and laboratory) are listed below for reference.     Microbiology: No results found for this or any previous visit (from the past 240 hour(s)).   Labs: BNP (last 3 results) No results for input(s): BNP in the last 8760 hours. Basic Metabolic Panel: Recent Labs  Lab 02/22/19 1450 02/23/19 1212 02/24/19 0409 02/25/19 0331 02/26/19 0414 02/27/19 0325  NA 139 134* 136 135 136 136  K 3.9 3.1* 3.1* 3.0* 2.9* 2.8*  CL 103 104 106 105 103 105  CO2 20* 21* 20* 21* 22 23  GLUCOSE 111* 116* 113* 105* 79 99  BUN 20 19 16 11 15 12   CREATININE 0.79 0.61 0.70 0.59 0.61 0.62  CALCIUM 9.0 8.1* 7.9* 8.1* 8.4* 8.1*  MG 1.8  --   --   --   --  1.7   Liver Function Tests: Recent Labs  Lab 02/22/19 1450 02/23/19 1212  AST 21 23  ALT 14 16  ALKPHOS 106 80  BILITOT 0.7 0.9  PROT 7.6 6.7  ALBUMIN 3.4* 3.2*   Recent Labs  Lab 02/23/19 1212  LIPASE 25   No results for input(s): AMMONIA in the last 168  hours. CBC: Recent Labs  Lab 02/23/19 1212 02/24/19 0409 02/25/19 0331 02/26/19 0414 02/27/19 0325  WBC 4.2 2.0* 1.3* 2.8* 3.4*  NEUTROABS 3.9 1.5* 0.8* 2.2 2.5  HGB 9.6* 8.6* 8.1* 8.8* 8.2*  HCT 28.8* 26.6* 25.0* 26.6* 25.3*  MCV 101.8* 103.9* 103.3* 102.7* 101.6*  PLT 235 197 174 149* 124*   Cardiac Enzymes: Recent Labs  Lab 02/23/19 1213  TROPONINI <0.03   BNP: Invalid input(s): POCBNP CBG: No results for input(s): GLUCAP in the last 168 hours. D-Dimer No results for input(s): DDIMER in the last 72 hours. Hgb A1c No results for input(s): HGBA1C in the last 72 hours. Lipid Profile No results for input(s): CHOL, HDL, LDLCALC, TRIG, CHOLHDL,  LDLDIRECT in the last 72 hours. Thyroid function studies No results for input(s): TSH, T4TOTAL, T3FREE, THYROIDAB in the last 72 hours.  Invalid input(s): FREET3 Anemia work up No results for input(s): VITAMINB12, FOLATE, FERRITIN, TIBC, IRON, RETICCTPCT in the last 72 hours. Urinalysis    Component Value Date/Time   COLORURINE YELLOW 02/15/2019 1335   APPEARANCEUR CLOUDY (A) 02/15/2019 1335   LABSPEC 1.041 (H) 02/15/2019 1335   PHURINE 7.0 02/15/2019 1335   GLUCOSEU NEGATIVE 02/15/2019 1335   GLUCOSEU NEGATIVE 07/05/2017 1136   HGBUR SMALL (A) 02/15/2019 1335   BILIRUBINUR NEGATIVE 02/15/2019 1335   BILIRUBINUR neg 03/16/2018 1530   KETONESUR NEGATIVE 02/15/2019 1335   PROTEINUR 30 (A) 02/15/2019 1335   UROBILINOGEN 0.2 03/16/2018 1530   UROBILINOGEN 0.2 07/05/2017 1136   NITRITE NEGATIVE 02/15/2019 1335   LEUKOCYTESUR LARGE (A) 02/15/2019 1335   Sepsis Labs Invalid input(s): PROCALCITONIN,  WBC,  LACTICIDVEN Microbiology No results found for this or any previous visit (from the past 240 hour(s)).   Time coordinating discharge: Over 30 minutes  SIGNED:   Delisia Mcquiston J [place] DO  [facility] 02/27/2019, 9:58 AM

## 2019-02-27 NOTE — Progress Notes (Signed)
Subjective/Chief Complaint: Had bm yesterday, no real abd pain, no n/v, tol fulls, feels well   Objective: Vital signs in last 24 hours: Temp:  [97.4 F (36.3 C)-98 F (36.7 C)] 97.4 F (36.3 C) (04/15 0435) Pulse Rate:  [68-92] 68 (04/15 0435) Resp:  [16] 16 (04/15 0435) BP: (99-107)/(60-67) 107/60 (04/15 0435) SpO2:  [97 %-100 %] 97 % (04/15 0435) Last BM Date: 02/26/19  Intake/Output from previous day: 04/14 0701 - 04/15 0700 In: -  Out: 1 [Stool:1] Intake/Output this shift: No intake/output data recorded.  General well appearing Ext mild edema GI: soft nontender nondistended bs present  Lab Results:  Recent Labs    02/26/19 0414 02/27/19 0325  WBC 2.8* 3.4*  HGB 8.8* 8.2*  HCT 26.6* 25.3*  PLT 149* 124*   BMET Recent Labs    02/26/19 0414 02/27/19 0325  NA 136 136  K 2.9* 2.8*  CL 103 105  CO2 22 23  GLUCOSE 79 99  BUN 15 12  CREATININE 0.61 0.62  CALCIUM 8.4* 8.1*   PT/INR No results for input(s): LABPROT, INR in the last 72 hours. ABG No results for input(s): PHART, HCO3 in the last 72 hours.  Invalid input(s): PCO2, PO2  Studies/Results: Ct Abdomen Pelvis W Contrast  Result Date: 02/26/2019 CLINICAL DATA:  Follow-up diverticulitis EXAM: CT ABDOMEN AND PELVIS WITH CONTRAST TECHNIQUE: Multidetector CT imaging of the abdomen and pelvis was performed using the standard protocol following bolus administration of intravenous contrast. CONTRAST:  128mL OMNIPAQUE IOHEXOL 300 MG/ML  SOLN COMPARISON:  CT abdomen pelvis, 02/23/2019 FINDINGS: Lower chest: No acute abnormality. Redemonstrated necrotic epicardial lymph node about the cardiac apex (series 2, image 16). Hepatobiliary: No focal liver abnormality is seen. No gallstones, gallbladder wall thickening, or biliary dilatation. Pancreas: Unremarkable. No pancreatic ductal dilatation or surrounding inflammatory changes. Spleen: Normal in size without focal abnormality. Adrenals/Urinary Tract: Adrenal  glands are unremarkable. Numerous nonobstructive small bilateral renal calculi. Bladder is unremarkable. Stomach/Bowel: Stomach is within normal limits. Appendix appears normal. Severe sigmoid diverticulosis with mild thickening of the proximal to mid sigmoid wall, slightly improved, and a redemonstrated intramural abscess at the left aspect of the mid sigmoid (series 2, image 76). This is not significantly changed compared to prior examination and measures approximately 1.8 cm. Vascular/Lymphatic: Severe mixed calcific atherosclerosis of the abdominal aorta and branch vasculature no enlarged abdominal or pelvic lymph nodes. Reproductive: No mass or other abnormality. Other: No abdominal wall hernia or abnormality. No abdominopelvic ascites. Musculoskeletal: No acute osseous findings. Redemonstrated sclerotic lesion of the T10 vertebral body and soft tissue lesion of the left tenth rib. IMPRESSION: 1. Severe sigmoid diverticulosis with mild thickening of the proximal to mid sigmoid wall, slightly improved, and a redemonstrated intramural abscess at the left aspect of the mid sigmoid (series 2, image 76). This is not significantly changed compared to prior examination and measures approximately 1.8 cm. 2. Other chronic and incidental findings as detailed above, including known metastatic neuroendocrine malignancy. Electronically Signed   By: Eddie Candle M.D.   On: 02/26/2019 12:55    Anti-infectives: Anti-infectives (From admission, onward)   Start     Dose/Rate Route Frequency Ordered Stop   02/27/19 1200  moxifloxacin (AVELOX) tablet 400 mg     400 mg Oral Daily 02/26/19 1449     02/23/19 2000  piperacillin-tazobactam (ZOSYN) IVPB 3.375 g     3.375 g 12.5 mL/hr over 240 Minutes Intravenous Every 8 hours 02/23/19 1608 02/27/19 1159   02/23/19 1400  piperacillin-tazobactam (ZOSYN) IVPB 3.375 g     3.375 g 100 mL/hr over 30 Minutes Intravenous  Once 02/23/19 1347 02/23/19 1427       Assessment/Plan: Diverticulitis with likely intramural abscess -appears she has resolved this by ct scan and clinically -due to issues with pills discussed with pharmacy and will start avelox today, stop zosyn - can dc home today with 10 days avelox, will follow up  -can restart anticoagulation 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 Follow by Dr. Earlie Server. On chemotx now On warfarin for clot involving port For thrombosis involving her port cath.Agree with holding oral anticoagulation for now in case interventional radiology or surgery needed on Lovenox  Rolm Bookbinder 02/27/2019

## 2019-02-27 NOTE — Discharge Instructions (Signed)

## 2019-02-27 NOTE — Progress Notes (Signed)
Patient declined to take potassium pill. Patient states they are too big. Offered to split pill in half or dissolve in water. Patient states that was tried yesterday & doesn't work for her. Called pharmacy & told patient she could take  10 meq pills and advised she would have to take 4 or she could do liquid. Patient choose to do pills. Once pills arrived patient stated they were still too big. Notified doctor and he changed prescription to oral. Patient states she will just take it when she gets home.

## 2019-02-28 ENCOUNTER — Telehealth: Payer: Self-pay | Admitting: *Deleted

## 2019-02-28 NOTE — Telephone Encounter (Signed)
Tried calling pt to make virtual [place] f/u. There was no answer and couldn't leave msg due to vm being full. Will retry.Sent CRM to [facility] just in case pt calls back...Alison Sullivan

## 2019-03-01 NOTE — Telephone Encounter (Signed)
Tried calling pt again still no answer and not able to leave vm due to vm being full.Marland KitchenJohny Sullivan

## 2019-03-04 ENCOUNTER — Other Ambulatory Visit: Payer: Self-pay

## 2019-03-04 ENCOUNTER — Encounter (HOSPITAL_COMMUNITY): Payer: Self-pay

## 2019-03-04 ENCOUNTER — Telehealth: Payer: Self-pay | Admitting: *Deleted

## 2019-03-04 ENCOUNTER — Emergency Department (HOSPITAL_COMMUNITY): Payer: Medicare Other

## 2019-03-04 ENCOUNTER — Emergency Department (HOSPITAL_COMMUNITY)
Admission: EM | Admit: 2019-03-04 | Discharge: 2019-03-04 | Disposition: A | Discharge status: Home / Self Care | Payer: Medicare Other | Attending: Emergency Medicine | Admitting: Emergency Medicine

## 2019-03-04 ENCOUNTER — Other Ambulatory Visit: Payer: Medicare Other

## 2019-03-04 ENCOUNTER — Inpatient Hospital Stay: Payer: Medicare Other

## 2019-03-04 DIAGNOSIS — R5383 Other fatigue: Secondary | ICD-10-CM | POA: Diagnosis not present

## 2019-03-04 DIAGNOSIS — Z87891 Personal history of nicotine dependence: Secondary | ICD-10-CM | POA: Insufficient documentation

## 2019-03-04 DIAGNOSIS — Z7901 Long term (current) use of anticoagulants: Secondary | ICD-10-CM | POA: Insufficient documentation

## 2019-03-04 DIAGNOSIS — Z79899 Other long term (current) drug therapy: Secondary | ICD-10-CM | POA: Insufficient documentation

## 2019-03-04 DIAGNOSIS — R112 Nausea with vomiting, unspecified: Secondary | ICD-10-CM | POA: Diagnosis not present

## 2019-03-04 DIAGNOSIS — E876 Hypokalemia: Secondary | ICD-10-CM | POA: Insufficient documentation

## 2019-03-04 DIAGNOSIS — C3492 Malignant neoplasm of unspecified part of left bronchus or lung: Secondary | ICD-10-CM | POA: Diagnosis not present

## 2019-03-04 DIAGNOSIS — J449 Chronic obstructive pulmonary disease, unspecified: Secondary | ICD-10-CM | POA: Diagnosis not present

## 2019-03-04 DIAGNOSIS — R531 Weakness: Secondary | ICD-10-CM | POA: Diagnosis not present

## 2019-03-04 DIAGNOSIS — R0602 Shortness of breath: Secondary | ICD-10-CM | POA: Insufficient documentation

## 2019-03-04 DIAGNOSIS — R52 Pain, unspecified: Secondary | ICD-10-CM | POA: Diagnosis not present

## 2019-03-04 LAB — COMPREHENSIVE METABOLIC PANEL
ALT: 30 U/L (ref 0–44)
AST: 33 U/L (ref 15–41)
Albumin: 3.1 g/dL — ABNORMAL LOW (ref 3.5–5.0)
Alkaline Phosphatase: 80 U/L (ref 38–126)
Anion gap: 8 (ref 5–15)
BUN: 12 mg/dL (ref 8–23)
CO2: 21 mmol/L — ABNORMAL LOW (ref 22–32)
Calcium: 7.9 mg/dL — ABNORMAL LOW (ref 8.9–10.3)
Chloride: 109 mmol/L (ref 98–111)
Creatinine, Ser: 0.68 mg/dL (ref 0.44–1.00)
GFR calc Af Amer: >60 mL/min (ref >60)
GFR calc non Af Amer: >60 mL/min (ref >60)
Glucose, Bld: 110 mg/dL — ABNORMAL HIGH (ref 70–99)
Potassium: 3 mmol/L — ABNORMAL LOW (ref 3.5–5.1)
Sodium: 138 mmol/L (ref 135–145)
Total Bilirubin: 0.4 mg/dL (ref 0.3–1.2)
Total Protein: 6.1 g/dL — ABNORMAL LOW (ref 6.5–8.1)

## 2019-03-04 LAB — CBC WITH DIFFERENTIAL/PLATELET
Abs Immature Granulocytes: 0.09 10*3/uL — ABNORMAL HIGH (ref 0.00–0.07)
Basophils Absolute: 0 10*3/uL (ref 0.0–0.1)
Basophils Relative: 1 %
Eosinophils Absolute: 0 10*3/uL (ref 0.0–0.5)
Eosinophils Relative: 1 %
HCT: 24.6 % — ABNORMAL LOW (ref 36.0–46.0)
Hemoglobin: 8.3 g/dL — ABNORMAL LOW (ref 12.0–15.0)
Immature Granulocytes: 4 %
Lymphocytes Relative: 39 %
Lymphs Abs: 0.9 10*3/uL (ref 0.7–4.0)
MCH: 34.6 pg — ABNORMAL HIGH (ref 26.0–34.0)
MCHC: 33.7 g/dL (ref 30.0–36.0)
MCV: 102.5 fL — ABNORMAL HIGH (ref 80.0–100.0)
Monocytes Absolute: 0.6 10*3/uL (ref 0.1–1.0)
Monocytes Relative: 29 %
Neutro Abs: 0.6 10*3/uL — ABNORMAL LOW (ref 1.7–7.7)
Neutrophils Relative %: 26 %
Platelets: 43 10*3/uL — ABNORMAL LOW (ref 150–400)
RBC: 2.4 MIL/uL — ABNORMAL LOW (ref 3.87–5.11)
RDW: 17.1 % — ABNORMAL HIGH (ref 11.5–15.5)
WBC: 2.2 10*3/uL — ABNORMAL LOW (ref 4.0–10.5)
nRBC: 0 % (ref 0.0–0.2)

## 2019-03-04 LAB — PROTIME-INR
INR: 1.5 — ABNORMAL HIGH (ref 0.8–1.2)
Prothrombin Time: 17.6 seconds — ABNORMAL HIGH (ref 11.4–15.2)

## 2019-03-04 LAB — TROPONIN I: Troponin I: <0.03 ng/mL (ref <0.03)

## 2019-03-04 MED ORDER — SODIUM CHLORIDE (PF) 0.9 % IJ SOLN
INTRAMUSCULAR | Status: AC
  Filled 2019-03-04: qty 50

## 2019-03-04 MED ORDER — IOHEXOL 350 MG/ML SOLN
100.0000 mL | Freq: Once | INTRAVENOUS | Status: AC | PRN
  Administered 2019-03-04: 19:00:00 100 mL via INTRAVENOUS

## 2019-03-04 MED ORDER — SODIUM CHLORIDE 0.9 % IV BOLUS
1000.0000 mL | Freq: Once | INTRAVENOUS | Status: AC
  Administered 2019-03-04: 1000 mL via INTRAVENOUS

## 2019-03-04 MED ORDER — POTASSIUM CHLORIDE CRYS ER 20 MEQ PO TBCR
40.0000 meq | EXTENDED_RELEASE_TABLET | Freq: Once | ORAL | Status: AC
  Administered 2019-03-04: 40 meq via ORAL
  Filled 2019-03-04: qty 2

## 2019-03-04 NOTE — ED Provider Notes (Signed)
Alison Sullivan Provider Note   CSN: [id number] Arrival date & time: 03/04/19  1652    History   Chief Complaint Chief Complaint  Patient presents with  . Orthostatic  . Fatigue    HPI Alison Sullivan is a 68 y.o. female medical history significant for lung cancer with metastasis currently undergoing chemotherapy presents to emergency department today with chief complaint of fatigue and shortness of breath.  Patient states she has felt fatigued x5 days and dyspnea x2 days. P  Pt was recently discharged from the hospital after being treated for acute diverticulitis with hypokalemia.  She states that ever since being discharged she has not felt like herself. She feels too weak to walk around is unsteady on her feet. She reports sudden onset dyspnea, She is unable to catch her breath and has to take short quick breaths. Also admits to chills and decreased appetite. Her last chemo treatment was x 1 week ago. Pt is currently taking  She denies fever, abdominal pain, cough, chest pain, diarrhea, lower extremity edema.  History provided by patient with additional history obtained from chart review.      Past Medical History:  Diagnosis Date  . Arthritis   . Chronic kidney disease   . Dog bite of wrist   . History of kidney stones   . History of radiation therapy 10/23/18- 11/06/18   Left lower rib mass/ 30 Gy delivered in 10 fractions 3 Gy.   . Kidney infection   . Left renal mass   . Osteopenia   . Pyelonephritis   . Recurrent UTI (urinary tract infection)     Patient Active Problem List   Diagnosis Date Noted  . Neuroendocrine carcinoma of the left lung  02/24/2019  . Neutropenia ([place]) 02/24/2019  . Diverticulitis of sigmoid colon 02/24/2019  . Thrombus port  A cath 02/24/2019  . Abscess of sigmoid colon due to diverticulitis 02/23/2019  . Long term current use of anticoagulant therapy 01/10/2019  . Port-A-Cath in place 12/20/2018  . Large  cell carcinoma of left lung, stage 4 ([place]) 10/18/2018  . Encounter for antineoplastic immunotherapy 10/18/2018  . Bone metastases ([place]) 10/10/2018  . Bronchiectasis without complication ([place]) 83/41/9622  . COPD GOLD ? II  Still smoking 09/12/2018  . Cigarette smoker 09/12/2018  . Pulmonary nodule 09/11/2018  . Left-sided back pain 08/06/2018  . Rib pain on left side 08/06/2018  . Acute thoracic back pain 07/05/2018  . UTI symptoms 03/17/2018  . Closed nondisplaced fracture of lateral malleolus of left fibula 03/06/2018  . Pain in left ankle and joints of left foot 02/13/2018  . Right low back pain 02/09/2018  . Fall 02/09/2018  . Right ankle pain 02/09/2018  . Left ankle pain 02/09/2018  . Hyperlipidemia 01/10/2018  . Routine general medical examination at a health care facility 01/02/2015  . Insomnia 01/02/2015  . Nephrolithiasis 08/11/2013    Past Surgical History:  Procedure Laterality Date  . CYSTOSCOPY WITH RETROGRADE PYELOGRAM, URETEROSCOPY AND STENT PLACEMENT Left 12/28/2017   Procedure: CYSTOSCOPY WITH RETROGRADE PYELOGRAM, URETEROSCOPY AND STENT PLACEMENT, STONE BASKETRY;  Surgeon: Festus Aloe, MD;  Location: [facility];  Service: Urology;  Laterality: Left;  . FINGER SURGERY     index, growth removal  . HOLMIUM LASER APPLICATION Left 2/97/9892   Procedure: HOLMIUM LASER APPLICATION;  Surgeon: Festus Aloe, MD;  Location: [facility];  Service: Urology;  Laterality: Left;  . IR IMAGING GUIDED PORT INSERTION  10/31/2018  .  KIDNEY STONE SURGERY  2011 or 2012  . LITHOTRIPSY  2007     OB History   No obstetric history on file.      Home Medications    Prior to Admission medications   Medication Sig Start Date End Date Taking? Authorizing Provider  folic acid (FOLVITE) 1 MG tablet Take 1 tablet (1 mg total) by mouth daily. Patient taking differently: Take 1 mg by mouth every evening.  12/10/18   Curt Bears, MD   gabapentin (NEURONTIN) 300 MG capsule Take 1 capsule (300 mg total) by mouth 3 (three) times daily. 02/05/19   [name], MD  lidocaine (XYLOCAINE) 2 % solution Use as directed 5 mLs in the mouth or throat every 3 (three) hours as needed for mouth pain. May mix with Mylanta before taking if desired. Patient not taking: Reported on 02/23/2019 02/13/19   Harle Stanford., PA-C  lidocaine-prilocaine (EMLA) cream Apply 1 application topically as needed. Patient taking differently: Apply 1 application topically as needed (For port-a-cath.).  10/18/18   Curt Bears, MD  Melatonin 10 MG TABS Take 10 mg by mouth at bedtime.     [provider]  moxifloxacin (AVELOX) 400 MG tablet Take 1 tablet (400 mg total) by mouth daily at 8 pm. 02/27/19   Rolm Bookbinder, MD  omeprazole (PRILOSEC) 40 MG capsule Take 1 capsule (40 mg total) by mouth daily. Patient taking differently: Take 40 mg by mouth every evening.  02/13/19   Tanner, Lyndon Code., PA-C  oxyCODONE (OXY IR/ROXICODONE) 5 MG immediate release tablet Take 1-2 tablets (5-10 mg total) by mouth every 4 (four) hours as needed for severe pain. 02/04/19   [name], MD  potassium chloride (K-DUR) 10 MEQ tablet Take 4 tablets (40 mEq total) by mouth daily for 4 days. 02/27/19 03/03/19  [place] Donnamarie Poag, DO  Potassium Chloride 40 MEQ/15ML (20%) SOLN Take 40 mEq by mouth daily for 4 days. 02/27/19 03/03/19  [place] Donnamarie Poag, DO  prochlorperazine (COMPAZINE) 10 MG tablet Take 1 tablet (10 mg total) by mouth every 6 (six) hours as needed for nausea or vomiting. 10/18/18   Curt Bears, MD  scopolamine (TRANSDERM-SCOP) 1 MG/3DAYS Place 1 patch (1.5 mg total) onto the skin every 3 (three) days. 02/22/19   Tanner, Lyndon Code., PA-C  simvastatin (ZOCOR) 20 MG tablet Take 1 tablet (20 mg total) by mouth daily at 6 PM. Need annual appointment for further refills Patient taking differently: Take 20 mg by mouth every evening.  02/12/19   [name], MD  warfarin  (COUMADIN) 5 MG tablet TAKE 1 TABLET(5 MG) BY MOUTH DAILY Patient taking differently: Take 2.5-5 mg by mouth every evening. Take half a tablet on Monday and Thursday and one tablet the rest of the week. 12/21/18   Curt Bears, MD    Family History Family History  Problem Relation Age of Onset  . Cancer Father        pancreatic  . Melanoma Father     Social History Social History   Tobacco Use  . Smoking status: Former Smoker    Packs/day: 0.25    Years: 30.00    Pack years: 7.50    Types: Cigarettes  . Smokeless tobacco: Never Used  . Tobacco comment: She quit early December 2019  Substance Use Topics  . Alcohol use: Yes    Comment: occ  . Drug use: No     Allergies   Contrast media [iodinated diagnostic agents]   Review of  Systems Review of Systems  Constitutional: Positive for activity change and chills. Negative for fever.  HENT: Negative for congestion, ear discharge, ear pain, sinus pressure, sinus pain and sore throat.   Eyes: Negative for pain and redness.  Respiratory: Positive for shortness of breath. Negative for cough.   Cardiovascular: Negative for chest pain.  Gastrointestinal: Negative for abdominal pain, constipation, diarrhea, nausea and vomiting.  Genitourinary: Negative for dysuria and hematuria.  Musculoskeletal: Negative for back pain and neck pain.  Skin: Negative for wound.  Neurological: Negative for weakness, numbness and headaches.     Physical Exam Updated Vital Signs BP 103/69 (BP Location: Right Arm)   Pulse 75   Temp 97.8 F (36.6 C) (Oral)   Resp 18   Ht 5\' 6"  (1.676 m)   Wt 65.8 kg   SpO2 100%   BMI 23.40 kg/m   Physical Exam Vitals signs and nursing note reviewed.  Constitutional:      Appearance: She is not diaphoretic.     Comments: Pt is frail, ill-appearing.  HENT:     Head: Normocephalic and atraumatic.  Eyes:     General: No scleral icterus.    Conjunctiva/sclera: Conjunctivae normal.  Neck:      Musculoskeletal: Normal range of motion.  Cardiovascular:     Rate and Rhythm: Normal rate and regular rhythm.     Pulses: Normal pulses.          Radial pulses are 2+ on the right side and 2+ on the left side.     Heart sounds: Normal heart sounds.  Pulmonary:     Effort: Pulmonary effort is normal.     Breath sounds: Normal breath sounds.     Comments: Lungs diminished throughout. Pt is in mild respiratory distress, she is speaking in short sentences. SpO2 is 100% on room air during my exam. Abdominal:     General: There is no distension.     Palpations: Abdomen is soft.     Tenderness: There is no abdominal tenderness. There is no guarding or rebound.  Musculoskeletal: Normal range of motion.     Comments: Negative homans sign bilaterally. Bilateral calves non tender to palpation. No lower extremity edema.  Skin:    General: Skin is warm and dry.  Neurological:     Mental Status: She is alert and oriented to person, place, and time.  Psychiatric:        Behavior: Behavior normal.      ED Treatments / Results  Labs (all labs ordered are listed, but only abnormal results are displayed) Labs Reviewed  COMPREHENSIVE METABOLIC PANEL - Abnormal; Notable for the following components:      Result Value   Potassium 3.0 (*)    CO2 21 (*)    Glucose, Bld 110 (*)    Calcium 7.9 (*)    Total Protein 6.1 (*)    Albumin 3.1 (*)    All other components within normal limits  CBC WITH DIFFERENTIAL/PLATELET - Abnormal; Notable for the following components:   WBC 2.2 (*)    RBC 2.40 (*)    Hemoglobin 8.3 (*)    HCT 24.6 (*)    MCV 102.5 (*)    MCH 34.6 (*)    RDW 17.1 (*)    Platelets 43 (*)    Neutro Abs 0.6 (*)    Abs Immature Granulocytes 0.09 (*)    All other components within normal limits  PROTIME-INR - Abnormal; Notable for the following components:  Prothrombin Time 17.6 (*)    INR 1.5 (*)    All other components within normal limits  TROPONIN I    EKG EKG  interpretation Date/Time:                  Monday March 04 2019 17:48:23 EDT Ventricular Rate:         73 PR Interval:                  * QRS Duration:            86 QT Interval:                 409 QTC Calculation:        451 R Axis:                         45 Text Interpretation:       Sinus rhythm Low voltage, precordial leads,  Abnormal R-wave progression, early transition, Borderline T abnormalities, anterior leads. No significant changes since last tracing. Confirmed by Thayer Jew [id number]) on 03/05/2019 12:00:10 AM  Radiology Ct Angio Chest Pe W And/or Wo Contrast  Result Date: 03/04/2019 CLINICAL DATA:  Weakness, shortness of breath. Stage IV neuroendocrine lung carcinoma. EXAM: CT ANGIOGRAPHY CHEST WITH CONTRAST TECHNIQUE: Multidetector CT imaging of the chest was performed using the standard protocol during bolus administration of intravenous contrast. Multiplanar CT image reconstructions and MIPs were obtained to evaluate the vascular anatomy. CONTRAST:  188mL OMNIPAQUE IOHEXOL 350 MG/ML SOLN COMPARISON:  02/15/2019 FINDINGS: Cardiovascular: No filling defects in the pulmonary arteries to suggest pulmonary emboli. Heart is borderline in size. Scattered aortic atherosclerosis. No evidence of aortic aneurysm. Mediastinum/Nodes: Left pericardiophrenic mass again noted measuring 2.9 x 2.9 cm compared to 3.6 x 2.9 cm previously. He prevascular adenopathy measures up to 1.8 cm in short axis dimension, stable. Lungs/Pleura: Left upper lobe mass measures up to 1.8 cm compared to 2.1 cm previously. Irregular bandlike opacity with surrounding ground-glass opacities in the right upper lobe is stable since prior study. Ground-glass nodular area in the posteromedial superior segment right lower lobe also stable. No effusions. Upper Abdomen: Imaging into the upper abdomen shows no acute findings. Musculoskeletal: Chest wall soft tissues are unremarkable. Sclerotic area within T10 vertebral body and  right pedicle are unchanged. The previously seen expansile lesion within the posterior left 10th rib is only partially visualized on today's study. Review of the MIP images confirms the above findings. IMPRESSION: No evidence of pulmonary embolus. Left cardiophrenic mass stable or slightly decreased in size. Prevascular adenopathy stable. Bilateral upper lobe nodules/masses and ground-glass nodular area in the superior segment of the right lower lobe are stable. Stable sclerotic lesion within the T10 vertebral body. Electronically Signed   By: Rolm Baptise M.D.   On: 03/04/2019 20:23   Dg Chest Portable 1 View  Result Date: 03/04/2019 CLINICAL DATA:  Shortness of breath. EXAM: PORTABLE CHEST 1 VIEW COMPARISON:  CT scan of February 15, 2019. Radiographs of August 06, 2018. FINDINGS: The heart size and mediastinal contours are within normal limits. Interval placement of right internal jugular Port-A-Cath with distal tip in expected position of cavoatrial junction. No pneumothorax or pleural effusion is noted. Stable left upper lobe nodular density is noted. Stable right upper lobe opacity is noted consistent with consolidation or scarring. No new abnormality is noted. The visualized skeletal structures are unremarkable. IMPRESSION: Stable left upper lobe nodule is noted concerning for metastatic  lesion. Stable right upper lobe opacity is noted concerning for scarring or consolidation. No significant changes noted compared to prior exam. Electronically Signed   By: Marijo Conception M.D.   On: 03/04/2019 18:24    Procedures Procedures (including critical care time)  Medications Ordered in ED Medications  iohexol (OMNIPAQUE) 350 MG/ML injection 100 mL (100 mLs Intravenous Contrast Given 03/04/19 1914)  sodium chloride 0.9 % bolus 1,000 mL (0 mLs Intravenous Stopped 03/04/19 2124)  potassium chloride SA (K-DUR) CR tablet 40 mEq (40 mEq Oral Given 03/04/19 2053)     Initial Impression / Assessment and Plan /  ED Course  I have reviewed the triage vital signs and the nursing notes.  Pertinent labs & imaging results that were available during my care of the patient were reviewed by me and considered in my medical decision making (see chart for details).  Pt is ill appearing, speaking in short sentences on initial exam. She was discharged from hospital x5 days ago after being treated for diverticulitis and hypokalemia. She admits to feeling unwell and had sudden onset shortness of breath. She is on Warfarin, but it was help during her hospital stay in anticipation of possible procedure. She has resumed it and denies missing any doses since discharge. DDX include dehydration, PE, viral URI.   Her workup today shows hypokalemia of 3.0, will replace with PO potassium. Her CBC is remarkable for platelets of 43, possible cause is the chemo she had x1 week ago. CTA chest viewed by me is negative for PE.Chest xray viewed by me is negative for acute infectious processes. On reassessment pt feels much improved after IV fluids. Her shortness of breath has improved and she is talking in full sentences.   Patient is hemodynamically stable, in NAD, and able to ambulate in the ED. Evaluation does not show pathology that would require ongoing emergent intervention or inpatient treatment. I explained the diagnosis to the patient. Patient is comfortable with above plan and is stable for discharge at this time. All questions were answered prior to disposition. Strict return precautions for returning to the ED were discussed. Encouraged follow up with PCP. The patient was discussed with and seen by Dr. Zenia Resides who agrees with the treatment plan.  This note was prepared with assistance of Systems analyst. Occasional wrong-word or sound-a-like substitutions may have occurred due to the inherent limitations of voice recognition software.   Final Clinical Impressions(s) / ED Diagnoses   Final diagnoses:  Fatigue,  unspecified type  Shortness of breath    ED Discharge Orders    None       Flint Melter 03/06/19 0313    Lacretia Leigh, MD 03/06/19 [phone]

## 2019-03-04 NOTE — ED Triage Notes (Signed)
Per EMS, Pt is coming from home. Pt is complaining of progressive weakness, and SOB since last discharge from here. Pt is positive orthostatics, and displaying signs of deyhrdation. Pt complaining of of pain focused around pts port. Denies diarrhea, and emesis since discharge.

## 2019-03-04 NOTE — Discharge Instructions (Addendum)
You have been seen today for fatigue and shortness of breath. Please read and follow all provided instructions. Return to the emergency room for worsening condition or new concerning symptoms.  Your CT scan today does not show a blood clot. Your potassium was low at 3.0.  1. Medications:  Prescription- continue taking the potassium pills you have at home and have your blood work drawn after finishing the pills. Continue usual home medications Take medications as prescribed. Please review all of the medicines and only take them if you do not have an allergy to them.   2. Treatment: rest, drink plenty of fluids 3. Follow Up: Please follow up with your primary doctor in 2-5 days for discussion of your diagnoses and further evaluation after today's visit; Call today to arrange your follow up.    It is also a possibility that you have an allergic reaction to any of the medicines that you have been prescribed - Everybody reacts differently to medications and while MOST people have no trouble with most medicines, you may have a reaction such as nausea, vomiting, rash, swelling, shortness of breath. If this is the case, please stop taking the medicine immediately and contact your physician.  ?

## 2019-03-04 NOTE — ED Provider Notes (Signed)
Medical screening examination/treatment/procedure(s) were conducted as a shared visit with non-physician practitioner(s) and myself.  I personally evaluated the patient during the encounter.  None Patient complains of weakness and shortness of breath x1 day.  Had a chest CT which was negative for PE.  Has also received chemotherapy recently and felt like she might been dehydrated was given IV fluids and now feels better.  Will discharge home   Lacretia Leigh, MD 03/04/19 2054

## 2019-03-04 NOTE — Telephone Encounter (Signed)
Tried calling pt on several occassions still not able to reach pt no one is answering, and not able to leave msg due to vm full.Marland KitchenJohny Sullivan

## 2019-03-04 NOTE — Telephone Encounter (Signed)
Received TC from patient's son, Alison Sullivan. He states that his mother was discharged from the hospital on 02/27/19 (diverticulitis) and that ever since she has been home she has been very SOB with minimal activity. Sitting still in a chair , she does ok but if she gets up and walks even a few feet, she becomes very short winded.  Alison Sullivan states that this is new for her. Alison Sullivan that patient should go to ED for evaluation as we cannot assess her well over the phone. He is in agreement and will tell his mother to do that and help with arranging for her to get to ED.  Patient was scheduled for labs today but did not come d/t weakness and SOB.

## 2019-03-04 NOTE — ED Notes (Signed)
Bed: EY22 Expected date:  Expected time:  Means of arrival:  Comments: EMS-weakness/orthostatic

## 2019-03-05 ENCOUNTER — Telehealth: Payer: Self-pay | Admitting: Internal Medicine

## 2019-03-05 NOTE — Telephone Encounter (Signed)
Tried to call patient twice . Yesterday and today - no answer and no voicemail . Called per 4/20 sch message

## 2019-03-06 ENCOUNTER — Telehealth: Payer: Self-pay | Admitting: Medical Oncology

## 2019-03-06 ENCOUNTER — Other Ambulatory Visit: Payer: Self-pay | Admitting: Medical Oncology

## 2019-03-06 NOTE — Telephone Encounter (Signed)
Transportation assistance and daily assistance requested . She is having a hard time with ADLS . Gwinda Maine and Ebony Hail notified.

## 2019-03-06 NOTE — Telephone Encounter (Signed)
Left voicemail for patient regarding transportation. I have helped her in the past but just wanted to re-verify her address. Left my contact number for her to call back.

## 2019-03-08 ENCOUNTER — Telehealth: Payer: Self-pay | Admitting: *Deleted

## 2019-03-08 ENCOUNTER — Other Ambulatory Visit: Payer: Self-pay | Admitting: Medical Oncology

## 2019-03-08 ENCOUNTER — Telehealth: Payer: Self-pay | Admitting: Medical Oncology

## 2019-03-08 DIAGNOSIS — C3492 Malignant neoplasm of unspecified part of left bronchus or lung: Secondary | ICD-10-CM

## 2019-03-08 NOTE — Telephone Encounter (Signed)
Requesting update on home health resources. Referral made to [facility] and f/u done with Ginette Otto for  transportation.

## 2019-03-08 NOTE — Telephone Encounter (Signed)
Alison Sullivan  Clinical Social Sullivan received referral from Psychologist, forensic.  CSW spoke with patient first and then contacted patient's son Shea Stakes.   CSW will email patient's son list of home care agencies and reviewed resources available.  CSW will follow up with patient next week to discuss counseling resources available and normalize counseling throughout cancer experience.  Gwinda Maine, LCSW  Clinical Social Worker [facility]

## 2019-03-11 ENCOUNTER — Inpatient Hospital Stay (HOSPITAL_BASED_OUTPATIENT_CLINIC_OR_DEPARTMENT_OTHER): Payer: Medicare Other | Admitting: Internal Medicine

## 2019-03-11 ENCOUNTER — Encounter: Payer: Self-pay | Admitting: Internal Medicine

## 2019-03-11 ENCOUNTER — Other Ambulatory Visit: Payer: Medicare Other

## 2019-03-11 ENCOUNTER — Inpatient Hospital Stay: Payer: Medicare Other

## 2019-03-11 ENCOUNTER — Other Ambulatory Visit: Payer: Self-pay

## 2019-03-11 VITALS — BP 102/67 | HR 110 | Temp 97.8°F | Resp 18 | Ht 66.0 in | Wt 138.2 lb

## 2019-03-11 VITALS — HR 85

## 2019-03-11 DIAGNOSIS — C7A1 Malignant poorly differentiated neuroendocrine tumors: Secondary | ICD-10-CM | POA: Diagnosis not present

## 2019-03-11 DIAGNOSIS — T85818D Embolism due to other internal prosthetic devices, implants and grafts, subsequent encounter: Secondary | ICD-10-CM

## 2019-03-11 DIAGNOSIS — Z79899 Other long term (current) drug therapy: Secondary | ICD-10-CM | POA: Diagnosis not present

## 2019-03-11 DIAGNOSIS — Z923 Personal history of irradiation: Secondary | ICD-10-CM | POA: Diagnosis not present

## 2019-03-11 DIAGNOSIS — C3492 Malignant neoplasm of unspecified part of left bronchus or lung: Secondary | ICD-10-CM

## 2019-03-11 DIAGNOSIS — C7A8 Other malignant neuroendocrine tumors: Secondary | ICD-10-CM | POA: Diagnosis not present

## 2019-03-11 DIAGNOSIS — M8580 Other specified disorders of bone density and structure, unspecified site: Secondary | ICD-10-CM | POA: Diagnosis not present

## 2019-03-11 DIAGNOSIS — Z5112 Encounter for antineoplastic immunotherapy: Secondary | ICD-10-CM | POA: Diagnosis not present

## 2019-03-11 DIAGNOSIS — G893 Neoplasm related pain (acute) (chronic): Secondary | ICD-10-CM | POA: Diagnosis not present

## 2019-03-11 DIAGNOSIS — Z5111 Encounter for antineoplastic chemotherapy: Secondary | ICD-10-CM | POA: Diagnosis not present

## 2019-03-11 DIAGNOSIS — K5792 Diverticulitis of intestine, part unspecified, without perforation or abscess without bleeding: Secondary | ICD-10-CM | POA: Diagnosis not present

## 2019-03-11 DIAGNOSIS — N189 Chronic kidney disease, unspecified: Secondary | ICD-10-CM | POA: Diagnosis not present

## 2019-03-11 DIAGNOSIS — Z87891 Personal history of nicotine dependence: Secondary | ICD-10-CM | POA: Diagnosis not present

## 2019-03-11 DIAGNOSIS — C7B8 Other secondary neuroendocrine tumors: Secondary | ICD-10-CM | POA: Diagnosis not present

## 2019-03-11 DIAGNOSIS — K209 Esophagitis, unspecified: Secondary | ICD-10-CM | POA: Diagnosis not present

## 2019-03-11 DIAGNOSIS — R112 Nausea with vomiting, unspecified: Secondary | ICD-10-CM | POA: Diagnosis not present

## 2019-03-11 DIAGNOSIS — Z7901 Long term (current) use of anticoagulants: Secondary | ICD-10-CM | POA: Diagnosis not present

## 2019-03-11 LAB — CBC WITH DIFFERENTIAL (CANCER CENTER ONLY)
Abs Immature Granulocytes: 0.05 10*3/uL (ref 0.00–0.07)
Basophils Absolute: 0 10*3/uL (ref 0.0–0.1)
Basophils Relative: 1 %
Eosinophils Absolute: 0 10*3/uL (ref 0.0–0.5)
Eosinophils Relative: 1 %
HCT: 30 % — ABNORMAL LOW (ref 36.0–46.0)
Hemoglobin: 9.9 g/dL — ABNORMAL LOW (ref 12.0–15.0)
Immature Granulocytes: 1 %
Lymphocytes Relative: 27 %
Lymphs Abs: 1.1 10*3/uL (ref 0.7–4.0)
MCH: 34.9 pg — ABNORMAL HIGH (ref 26.0–34.0)
MCHC: 33 g/dL (ref 30.0–36.0)
MCV: 105.6 fL — ABNORMAL HIGH (ref 80.0–100.0)
Monocytes Absolute: 0.8 10*3/uL (ref 0.1–1.0)
Monocytes Relative: 20 %
Neutro Abs: 2 10*3/uL (ref 1.7–7.7)
Neutrophils Relative %: 50 %
Platelet Count: 184 10*3/uL (ref 150–400)
RBC: 2.84 MIL/uL — ABNORMAL LOW (ref 3.87–5.11)
RDW: 18.9 % — ABNORMAL HIGH (ref 11.5–15.5)
WBC Count: 4 10*3/uL (ref 4.0–10.5)
nRBC: 0 % (ref 0.0–0.2)

## 2019-03-11 LAB — CMP (CANCER CENTER ONLY)
ALT: 26 U/L (ref 0–44)
AST: 34 U/L (ref 15–41)
Albumin: 3.3 g/dL — ABNORMAL LOW (ref 3.5–5.0)
Alkaline Phosphatase: 102 U/L (ref 38–126)
Anion gap: 10 (ref 5–15)
BUN: 12 mg/dL (ref 8–23)
CO2: 24 mmol/L (ref 22–32)
Calcium: 8.6 mg/dL — ABNORMAL LOW (ref 8.9–10.3)
Chloride: 105 mmol/L (ref 98–111)
Creatinine: 0.89 mg/dL (ref 0.44–1.00)
GFR, Est AFR Am: >60 mL/min (ref >60)
GFR, Estimated: >60 mL/min (ref >60)
Glucose, Bld: 115 mg/dL — ABNORMAL HIGH (ref 70–99)
Potassium: 3.3 mmol/L — ABNORMAL LOW (ref 3.5–5.1)
Sodium: 139 mmol/L (ref 135–145)
Total Bilirubin: 0.3 mg/dL (ref 0.3–1.2)
Total Protein: 6.8 g/dL (ref 6.5–8.1)

## 2019-03-11 LAB — PROTIME-INR
INR: 2.1 — ABNORMAL HIGH (ref 0.8–1.2)
Prothrombin Time: 23.3 seconds — ABNORMAL HIGH (ref 11.4–15.2)

## 2019-03-11 MED ORDER — SODIUM CHLORIDE 0.9 % IV SOLN
200.0000 mg | Freq: Once | INTRAVENOUS | Status: AC
  Administered 2019-03-11: 12:00:00 200 mg via INTRAVENOUS
  Filled 2019-03-11: qty 8

## 2019-03-11 MED ORDER — SODIUM CHLORIDE 0.9 % IV SOLN
Freq: Once | INTRAVENOUS | Status: AC
  Filled 2019-03-11: qty 250

## 2019-03-11 MED ORDER — HEPARIN SOD (PORK) LOCK FLUSH 100 UNIT/ML IV SOLN
500.0000 [IU] | Freq: Once | INTRAVENOUS | Status: AC | PRN
  Administered 2019-03-11: 500 [IU]
  Filled 2019-03-11: qty 5

## 2019-03-11 MED ORDER — PROCHLORPERAZINE MALEATE 10 MG PO TABS
10.0000 mg | ORAL_TABLET | Freq: Once | ORAL | Status: AC
  Administered 2019-03-11: 10 mg via ORAL

## 2019-03-11 MED ORDER — SODIUM CHLORIDE 0.9 % IV SOLN
Freq: Once | INTRAVENOUS | Status: AC
  Administered 2019-03-11: 11:00:00 via INTRAVENOUS
  Filled 2019-03-11: qty 250

## 2019-03-11 MED ORDER — PROCHLORPERAZINE MALEATE 10 MG PO TABS
ORAL_TABLET | ORAL | Status: AC
  Filled 2019-03-11: qty 1

## 2019-03-11 MED ORDER — SODIUM CHLORIDE 0.9 % IV SOLN
515.0000 mg/m2 | Freq: Once | INTRAVENOUS | Status: AC
  Administered 2019-03-11: 12:00:00 900 mg via INTRAVENOUS
  Filled 2019-03-11: qty 20

## 2019-03-11 MED ORDER — LIDOCAINE-PRILOCAINE 2.5-2.5 % EX CREA
TOPICAL_CREAM | CUTANEOUS | Status: AC
  Filled 2019-03-11: qty 5

## 2019-03-11 MED ORDER — SODIUM CHLORIDE 0.9% FLUSH
10.0000 mL | INTRAVENOUS | Status: DC | PRN
  Administered 2019-03-11: 10 mL
  Filled 2019-03-11: qty 10

## 2019-03-11 NOTE — Patient Instructions (Signed)
[place] Discharge Instructions for Patients Receiving Chemotherapy  Today you received the following chemotherapy agents Alimta, Keytruda  To help prevent nausea and vomiting after your treatment, we encourage you to take your nausea medication: As directed by MD   If you develop nausea and vomiting that is not controlled by your nausea medication, call the clinic.   BELOW ARE SYMPTOMS THAT SHOULD BE REPORTED IMMEDIATELY:  *FEVER GREATER THAN 100.5 F  *CHILLS WITH OR WITHOUT FEVER  NAUSEA AND VOMITING THAT IS NOT CONTROLLED WITH YOUR NAUSEA MEDICATION  *UNUSUAL SHORTNESS OF BREATH  *UNUSUAL BRUISING OR BLEEDING  TENDERNESS IN MOUTH AND THROAT WITH OR WITHOUT PRESENCE OF ULCERS  *URINARY PROBLEMS  *BOWEL PROBLEMS  UNUSUAL RASH Items with * indicate a potential emergency and should be followed up as soon as possible.  Feel free to call the clinic should you have any questions or concerns. The clinic phone number is [phone].  Please show the [place] at check-in to the Emergency Department and triage nurse.

## 2019-03-11 NOTE — Progress Notes (Signed)
[place] [phone]) [phone]   [phone]  OFFICE PROGRESS NOTE  [name], MD 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  She presented with left upper lobe lung nodule in addition to mediastinal lymphadenopathy as well as right upper lobe as well as metastatic bone disease to the sixth and 10th rib.  The patient has significant pain from her metastatic rib lesions.  PRIOR THERAPY:  1) systemic chemotherapy with carboplatin for AUC of 5 on day 1, etoposide 100 mg/M2 on days 1, 2 and 3 as well as Tecentriq 1200 mg IV every 3 weeks with Neulasta support.  Status post 2 cycles, discontinued secondary to disease progression. 2) palliative radiotherapy to progressive T10 vertebral body bone lesion under the care of Dr. Isidore Moos .  CURRENT THERAPY: Systemic chemotherapy with carboplatin for AUC of 5, Alimta 500 mg/M2 and Keytruda 200 mg IV every 3 weeks.  First dose December 17, 2018.  Status post 4 cycles.  INTERVAL HISTORY: Alison Sullivan 68 y.o. female returns to the clinic today for follow-up visit.  The patient was recently admitted to the hospital with diverticulitis.  She was treated with IV Zosyn and then oral Avelox.  She is feeling much better with no more diarrhea or abdominal pain.  She was also seen last week at the emergency department complaining of shortness of breath and repeat CT angiogram of the chest showed no concerning findings for pulmonary embolus or other concerning abnormalities to explain her shortness of breath.  She was given IV hydration and sent home.  The patient continues to have itching around her lower chest.  She denied having any current fever or chills.  She has no nausea, vomiting, diarrhea or constipation.  She denied having any headache or visual changes.  She is here today for  evaluation before starting cycle #5 of her treatment and this is the first maintenance cycle with Alimta and Keytruda.   MEDICAL HISTORY: Past Medical History:  Diagnosis Date   Arthritis    Chronic kidney disease    Dog bite of wrist    History of kidney stones    History of radiation therapy 10/23/18- 11/06/18   Left lower rib mass/ 30 Gy delivered in 10 fractions 3 Gy.    Kidney infection    Left renal mass    Osteopenia    Pyelonephritis    Recurrent UTI (urinary tract infection)     ALLERGIES:  is allergic to contrast media [iodinated diagnostic agents].  MEDICATIONS:  Current Outpatient Medications  Medication Sig Dispense Refill   folic acid (FOLVITE) 1 MG tablet Take 1 tablet (1 mg total) by mouth daily. (Patient taking differently: Take 1 mg by mouth every evening. ) 30 tablet 4   gabapentin (NEURONTIN) 300 MG capsule Take 1 capsule (300 mg total) by mouth 3 (three) times daily. 90 capsule 0   lidocaine (XYLOCAINE) 2 % solution Use as directed 5 mLs in the mouth or throat every 3 (three) hours as needed for mouth pain. May mix with Mylanta before taking if desired. (Patient not taking: Reported on 02/23/2019) 200 mL 1   lidocaine-prilocaine (EMLA) cream Apply 1 application topically as needed. (Patient taking differently: Apply 1 application topically as needed (For port-a-cath.). ) 30 g 0   Melatonin 10 MG TABS Take 10 mg by mouth at bedtime.  moxifloxacin (AVELOX) 400 MG tablet Take 1 tablet (400 mg total) by mouth daily at 8 pm. 10 tablet 0   omeprazole (PRILOSEC) 40 MG capsule Take 1 capsule (40 mg total) by mouth daily. (Patient taking differently: Take 40 mg by mouth every evening. ) 30 capsule 2   oxyCODONE (OXY IR/ROXICODONE) 5 MG immediate release tablet Take 1-2 tablets (5-10 mg total) by mouth every 4 (four) hours as needed for severe pain. 120 tablet 0   potassium chloride (K-DUR) 10 MEQ tablet Take 4 tablets (40 mEq total) by mouth daily for  4 days. 16 tablet 0   Potassium Chloride 40 MEQ/15ML (20%) SOLN Take 40 mEq by mouth daily for 4 days. 60 mL 0   prochlorperazine (COMPAZINE) 10 MG tablet Take 1 tablet (10 mg total) by mouth every 6 (six) hours as needed for nausea or vomiting. 30 tablet 0   scopolamine (TRANSDERM-SCOP) 1 MG/3DAYS Place 1 patch (1.5 mg total) onto the skin every 3 (three) days. 10 patch 12   simvastatin (ZOCOR) 20 MG tablet Take 1 tablet (20 mg total) by mouth daily at 6 PM. Need annual appointment for further refills (Patient taking differently: Take 20 mg by mouth every evening. ) 90 tablet 0   warfarin (COUMADIN) 5 MG tablet TAKE 1 TABLET(5 MG) BY MOUTH DAILY (Patient taking differently: Take 2.5-5 mg by mouth every evening. Take half a tablet on Monday and Thursday and one tablet the rest of the week.) 90 tablet 1   No current facility-administered medications for this visit.     SURGICAL HISTORY:  Past Surgical History:  Procedure Laterality Date   CYSTOSCOPY WITH RETROGRADE PYELOGRAM, URETEROSCOPY AND STENT PLACEMENT Left 12/28/2017   Procedure: CYSTOSCOPY WITH RETROGRADE PYELOGRAM, URETEROSCOPY AND STENT PLACEMENT, STONE BASKETRY;  Surgeon: Festus Aloe, MD;  Location[facility];  Service: Urology;  Laterality: Left;   FINGER SURGERY     index, growth removal   HOLMIUM LASER APPLICATION Left 7/62/8315   Procedure: HOLMIUM LASER APPLICATION;  Surgeon: Festus Aloe, MD;  Location: [facility];  Service: Urology;  Laterality: Left;   IR IMAGING GUIDED PORT INSERTION  10/31/2018   KIDNEY STONE SURGERY  2011 or 2012   LITHOTRIPSY  2007    REVIEW OF SYSTEMS:  Constitutional: positive for fatigue Eyes: negative Ears, nose, mouth, throat, and face: negative Respiratory: positive for dyspnea on exertion Cardiovascular: negative Gastrointestinal: negative Genitourinary:negative Integument/breast: positive for pruritus Hematologic/lymphatic:  negative Musculoskeletal:negative Neurological: negative Behavioral/Psych: negative Endocrine: negative Allergic/Immunologic: negative   PHYSICAL EXAMINATION: General appearance: alert, cooperative, fatigued and no distress Head: Normocephalic, without obvious abnormality, atraumatic Neck: no adenopathy, no JVD, supple, symmetrical, trachea midline and thyroid not enlarged, symmetric, no tenderness/mass/nodules Lymph nodes: Cervical, supraclavicular, and axillary nodes normal. Resp: clear to auscultation bilaterally Back: symmetric, no curvature. ROM normal. No CVA tenderness. Cardio: regular rate and rhythm, S1, S2 normal, no murmur, click, rub or gallop GI: soft, non-tender; bowel sounds normal; no masses,  no organomegaly Extremities: extremities normal, atraumatic, no cyanosis or edema Neurologic: Alert and oriented X 3, normal strength and tone. Normal symmetric reflexes. Normal coordination and gait  ECOG PERFORMANCE STATUS: 1 - Symptomatic but completely ambulatory  Blood pressure 102/67, pulse (!) 110, temperature 97.8 F (36.6 C), temperature source Oral, resp. rate 18, height 5\' 6"  (1.676 m), weight 138 lb 3.2 oz (62.7 kg), SpO2 100 %.  LABORATORY DATA: Lab Results  Component Value Date   WBC 4.0 03/11/2019   HGB 9.9 (L)  03/11/2019   HCT 30.0 (L) 03/11/2019   MCV 105.6 (H) 03/11/2019   PLT 184 03/11/2019      Chemistry      Component Value Date/Time   NA 138 03/04/2019 1729   K 3.0 (L) 03/04/2019 1729   CL 109 03/04/2019 1729   CO2 21 (L) 03/04/2019 1729   BUN 12 03/04/2019 1729   CREATININE 0.68 03/04/2019 1729   CREATININE 0.79 02/22/2019 1450      Component Value Date/Time   CALCIUM 7.9 (L) 03/04/2019 1729   ALKPHOS 80 03/04/2019 1729   AST 33 03/04/2019 1729   AST 21 02/22/2019 1450   ALT 30 03/04/2019 1729   ALT 14 02/22/2019 1450   BILITOT 0.4 03/04/2019 1729   BILITOT 0.7 02/22/2019 1450       RADIOGRAPHIC STUDIES: Ct Chest W  Contrast  Result Date: 02/15/2019 CLINICAL DATA:  Stage IV left upper lobe high-grade neuroendocrine lung carcinoma with small and large cell features diagnosed November 2019. interval chemotherapy and immunotherapy. Restaging. Palliative radiation therapy to thoracic spine and left rib lesions completed 02/08/2019. EXAM: CT CHEST, ABDOMEN, AND PELVIS WITH CONTRAST TECHNIQUE: Multidetector CT imaging of the chest, abdomen and pelvis was performed following the standard protocol during bolus administration of intravenous contrast. CONTRAST:  133mL OMNIPAQUE IOHEXOL 300 MG/ML  SOLN COMPARISON:  12/03/2018 CT chest, abdomen and pelvis. FINDINGS: CT CHEST FINDINGS Cardiovascular: Normal heart size. No significant pericardial effusion/thickening. Right internal jugular Port-A-Cath terminates in the lower third of the SVC. Atherosclerotic nonaneurysmal thoracic aorta. Normal caliber pulmonary arteries. No central pulmonary emboli. Mediastinum/Nodes: No discrete thyroid nodules. Unremarkable esophagus. No axillary adenopathy. Bulky 3.6 x 2.9 cm left pericardiophrenic mass (series 2/image 40), increased from 2.9 x 2.3 cm. Enlarged 1.9 cm short axis left prevascular node (series 2/image 17), increased from 1.4 cm using similar measurement technique. No hilar adenopathy. Lungs/Pleura: No pneumothorax. No right pleural effusion. New trace dependent left pleural effusion. Far inferior plaque-like posterior left pleural lesion measures 0.7 cm thickness, previously 0.8 cm, not appreciably changed (series 7/image 15). Subsolid 4.1 x 2.4 cm medial superior segment right lower lobe mass (series 6/image 51), previously 4.1 x 2.4 cm using similar measurement technique, stable. Thick irregular bandlike focus of consolidation with surrounding patchy ground-glass opacity in the right upper lobe is unchanged. Solid 2.1 x 1.7 cm left upper lobe pulmonary nodule (series 6/image 53), previously 2.4 x 1.7 cm, slightly decreased. No new  significant pulmonary nodules. No acute consolidative airspace disease. Musculoskeletal: Left posterior tenth rib expansile lesion measures 2.2 cm thickness, mildly increased from 1.8 cm, with associated healed pathologic fracture. Increased sclerosis within the sclerotic ill-defined T10 vertebral body lesion extending into the right pedicle. No new focal osseous lesions in the chest. Minimal thoracic spondylosis. CT ABDOMEN PELVIS FINDINGS Hepatobiliary: Normal liver size. Several tiny subcentimeter hypodense lesions scattered throughout the liver, too small to characterize, unchanged. No new liver lesions. Normal gallbladder with no radiopaque cholelithiasis. No biliary ductal dilatation. Pancreas: Normal, with no mass or duct dilation. Spleen: Normal size. No mass. Adrenals/Urinary Tract: Normal adrenals. New 3 mm stone at the right ureterovesical junction. Minimal right hydroureteronephrosis, minimally increased from prior. Increased urothelial wall thickening and enhancement throughout the right renal collecting system and right ureter. Several nonobstructing stones in the kidneys bilaterally, largest 8 mm in the lower right kidney and 5 mm in the lower left kidney. No overt left hydronephrosis. Hypodense 2.4 cm lateral interpolar right renal cortical lesion is stable in size. Numerous subcentimeter  hypodense renal cortical lesions throughout both kidneys are too small to characterize and are not appreciably changed. Nondistended bladder with suggestion of new mild bladder wall thickening and mucosal hyperenhancement. Stomach/Bowel: Normal non-distended stomach. Normal caliber small bowel with no small bowel wall thickening. Appendix not discretely visualized. Marked sigmoid diverticulosis. Similar chronic mild wall thickening in the sigmoid colon without acute pericolonic fat stranding. Vascular/Lymphatic: Atherosclerotic nonaneurysmal abdominal aorta. Patent portal, splenic, hepatic and renal veins. No  pathologically enlarged lymph nodes in the abdomen or pelvis. Reproductive: Grossly normal uterus. Stable simple benign appearing 1.2 cm right adnexal cyst. No left adnexal mass. Other: No pneumoperitoneum, ascites or focal fluid collection. Musculoskeletal: No aggressive appearing focal osseous lesions. Moderate lower lumbar spondylosis. IMPRESSION: 1. New 3 mm stone at the right ureterovesical junction, with minimal right hydroureteronephrosis, slightly increased from prior. 2. New mild diffuse bladder wall thickening and mucosal hyperenhancement. New urothelial wall thickening and hyperenhancement throughout the right renal collecting system and right ureter. Findings suggest acute cystitis with ascending right urinary tract infection. Suggest correlation with urinalysis. 3. Bulky left pericardiophrenic and left prevascular mediastinal nodal metastases have increased. 4. New trace dependent left pleural effusion. 5. Left upper lobe solid pulmonary nodule is slightly decreased. Superior segment right lower lobe subsolid lung mass is stable. 6. Increased sclerosis within the T10 spinal metastasis, nonspecific, potentially due to treatment change. Left posterior tenth rib expansile metastasis is slightly increased in thickness. Far inferior plaque-like pleural lesion adjacent to the left twelfth rib is stable. No new osseous lesions. 7. No evidence of metastatic disease in the abdomen or pelvis. 8. Marked sigmoid diverticulosis with chronic mild sigmoid wall thickening, favoring chronic diverticulosis. 9.  Aortic Atherosclerosis (ICD10-I70.0). These results will be called to the ordering clinician or representative by the Radiologist Assistant, and communication documented in the PACS or zVision Dashboard. Electronically Signed   By: Ilona Sorrel M.D.   On: 02/15/2019 16:13   Ct Angio Chest Pe W And/or Wo Contrast  Result Date: 03/04/2019 CLINICAL DATA:  Weakness, shortness of breath. Stage IV neuroendocrine  lung carcinoma. EXAM: CT ANGIOGRAPHY CHEST WITH CONTRAST TECHNIQUE: Multidetector CT imaging of the chest was performed using the standard protocol during bolus administration of intravenous contrast. Multiplanar CT image reconstructions and MIPs were obtained to evaluate the vascular anatomy. CONTRAST:  1101mL OMNIPAQUE IOHEXOL 350 MG/ML SOLN COMPARISON:  02/15/2019 FINDINGS: Cardiovascular: No filling defects in the pulmonary arteries to suggest pulmonary emboli. Heart is borderline in size. Scattered aortic atherosclerosis. No evidence of aortic aneurysm. Mediastinum/Nodes: Left pericardiophrenic mass again noted measuring 2.9 x 2.9 cm compared to 3.6 x 2.9 cm previously. He prevascular adenopathy measures up to 1.8 cm in short axis dimension, stable. Lungs/Pleura: Left upper lobe mass measures up to 1.8 cm compared to 2.1 cm previously. Irregular bandlike opacity with surrounding ground-glass opacities in the right upper lobe is stable since prior study. Ground-glass nodular area in the posteromedial superior segment right lower lobe also stable. No effusions. Upper Abdomen: Imaging into the upper abdomen shows no acute findings. Musculoskeletal: Chest wall soft tissues are unremarkable. Sclerotic area within T10 vertebral body and right pedicle are unchanged. The previously seen expansile lesion within the posterior left 10th rib is only partially visualized on today's study. Review of the MIP images confirms the above findings. IMPRESSION: No evidence of pulmonary embolus. Left cardiophrenic mass stable or slightly decreased in size. Prevascular adenopathy stable. Bilateral upper lobe nodules/masses and ground-glass nodular area in the superior segment of the right lower  lobe are stable. Stable sclerotic lesion within the T10 vertebral body. Electronically Signed   By: Rolm Baptise M.D.   On: 03/04/2019 20:23   Ct Abdomen Pelvis W Contrast  Result Date: 02/26/2019 CLINICAL DATA:  Follow-up diverticulitis  EXAM: CT ABDOMEN AND PELVIS WITH CONTRAST TECHNIQUE: Multidetector CT imaging of the abdomen and pelvis was performed using the standard protocol following bolus administration of intravenous contrast. CONTRAST:  196mL OMNIPAQUE IOHEXOL 300 MG/ML  SOLN COMPARISON:  CT abdomen pelvis, 02/23/2019 FINDINGS: Lower chest: No acute abnormality. Redemonstrated necrotic epicardial lymph node about the cardiac apex (series 2, image 16). Hepatobiliary: No focal liver abnormality is seen. No gallstones, gallbladder wall thickening, or biliary dilatation. Pancreas: Unremarkable. No pancreatic ductal dilatation or surrounding inflammatory changes. Spleen: Normal in size without focal abnormality. Adrenals/Urinary Tract: Adrenal glands are unremarkable. Numerous nonobstructive small bilateral renal calculi. Bladder is unremarkable. Stomach/Bowel: Stomach is within normal limits. Appendix appears normal. Severe sigmoid diverticulosis with mild thickening of the proximal to mid sigmoid wall, slightly improved, and a redemonstrated intramural abscess at the left aspect of the mid sigmoid (series 2, image 76). This is not significantly changed compared to prior examination and measures approximately 1.8 cm. Vascular/Lymphatic: Severe mixed calcific atherosclerosis of the abdominal aorta and branch vasculature no enlarged abdominal or pelvic lymph nodes. Reproductive: No mass or other abnormality. Other: No abdominal wall hernia or abnormality. No abdominopelvic ascites. Musculoskeletal: No acute osseous findings. Redemonstrated sclerotic lesion of the T10 vertebral body and soft tissue lesion of the left tenth rib. IMPRESSION: 1. Severe sigmoid diverticulosis with mild thickening of the proximal to mid sigmoid wall, slightly improved, and a redemonstrated intramural abscess at the left aspect of the mid sigmoid (series 2, image 76). This is not significantly changed compared to prior examination and measures approximately 1.8 cm. 2.  Other chronic and incidental findings as detailed above, including known metastatic neuroendocrine malignancy. Electronically Signed   By: Eddie Candle M.D.   On: 02/26/2019 12:55   Ct Abdomen Pelvis W Contrast  Result Date: 02/23/2019 CLINICAL DATA:  69 year old presenting with acute onset of LEFT LOWER abdominal pain and nausea/vomiting that began yesterday. Current history of urinary tract calculi. 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 EXAM: CT ABDOMEN AND PELVIS WITH CONTRAST TECHNIQUE: Multidetector CT imaging of the abdomen and pelvis was performed using the standard protocol following bolus administration of intravenous contrast. CONTRAST:  132mL OMNIPAQUE IOHEXOL 300 MG/ML IV. COMPARISON:  02/15/2019 and earlier, including PET-CT 09/18/2018. FINDINGS: Lower chest: Visualized lung bases clear apart from the expected dependent atelectasis posteriorly in the lower lobes. Stable mild BILATERAL lower lobe bronchiectasis. Stable normal heart size. Necrotic LEFT pericardiophrenic lymph node measuring approximately 2.6 x 3.3 x 3.6 cm, slightly increased in size since the January, 2020 CT. Hepatobiliary: Multiple sub-5 mm low-attenuation lesions throughout both lobes of the liver, unchanged since the January, 2020 examination. No new or enlarging liver lesions. Gallbladder normal in appearance without calcified gallstones. No biliary ductal dilation. Pancreas: Normal in appearance without evidence of mass, ductal dilation, or inflammation. Spleen: Normal in size and appearance. Adrenals/Urinary Tract: Normal appearing adrenal glands. Severe scarring involving both kidneys. BILATERAL nephrolithiasis, the largest stone measuring approximately 1 cm in a LOWER pole calyx of the RIGHT kidney. No  obstructing ureteral calculi on either side. Cortical cysts and  calyceal diverticula involving both kidneys. No convincing solid masses involving either kidney. Large extrarenal pelvis on the LEFT and small extrarenal pelvis on the RIGHT. Normal appearing decompressed urinary bladder. Stomach/Bowel: Stomach normal in appearance for the degree of distention. Normal-appearing small bowel. Entire colon relatively decompressed. Mild wall thickening diffusely throughout the decompressed colon. Distal descending and sigmoid colon diverticulosis without evidence of acute diverticulitis involving the proximal sigmoid colon in the LEFT side of the mid pelvis. Complex fluid collection in this location which is likely intramural. No evidence of extraluminal gas. Appendix not clearly demonstrated, but no pericecal inflammation. Vascular/Lymphatic: Severe aortoiliofemoral atherosclerosis with calcified and noncalcified plaque involving the abdominal aorta. Possible filling defects involving the BILATERAL iliofemoral veins, though this may be artifactual and related to incomplete opacification. No pathologic lymphadenopathy. Reproductive: Normal appearing atrophic uterus consistent with patient age. No adnexal masses. Other: None. Musculoskeletal: LEFT posterolateral tenth rib metastasis measuring approximately 3.8 x 1.9 cm, decreased in size since the prior PET-CT after radiation treatment. Mixed lytic and sclerotic metastasis involving the T10 vertebral body and the RIGHT pedicle of T10, more conspicuous than on the January, 2020 examination. No new osseous metastases elsewhere. IMPRESSION: 1. Acute diverticulitis involving the proximal sigmoid colon with evidence of an intramural abscess in the wall of the sigmoid colon. No evidence of perforation. 2. Enlarging necrotic left pericardiophrenic lymph node since the January, 2020 CT. 3. Stable sub-5 mm liver lesions which are too small to characterize by CT. 4. Possible bilateral  iliofemoral DVT, though this may be artifactual and related to incomplete opacification. Does the patient have clinical symptoms of BILATERAL LOWER extremity edema? If so, LOWER extremity venous Doppler ultrasound may be helpful to confirm a benign this finding. 5. Osseous metastasis involving the T10 vertebral body and the right pedicle of T10, more conspicuous than in January, 2020. Interval decrease in size of a LEFT tenth rib metastasis after treatment with radiation therapy. No new osseous metastases. 6. BILATERAL nephrolithiasis. No obstructing ureteral calculi. Severe scarring involving both kidneys. 7.  Aortic Atherosclerosis, severe.  (ICD10-170.0) Electronically Signed   By: Evangeline Dakin M.D.   On: 02/23/2019 13:40   Ct Abdomen Pelvis W Contrast  Result Date: 02/15/2019 CLINICAL DATA:  Stage IV left upper lobe high-grade neuroendocrine lung carcinoma with small and large cell features diagnosed November 2019. interval chemotherapy and immunotherapy. Restaging. Palliative radiation therapy to thoracic spine and left rib lesions completed 02/08/2019. EXAM: CT CHEST, ABDOMEN, AND PELVIS WITH CONTRAST TECHNIQUE: Multidetector CT imaging of the chest, abdomen and pelvis was performed following the standard protocol during bolus administration of intravenous contrast. CONTRAST:  135mL OMNIPAQUE IOHEXOL 300 MG/ML  SOLN COMPARISON:  12/03/2018 CT chest, abdomen and pelvis. FINDINGS: CT CHEST FINDINGS Cardiovascular: Normal heart size. No significant pericardial effusion/thickening. Right internal jugular Port-A-Cath terminates in the lower third of the SVC. Atherosclerotic nonaneurysmal thoracic aorta. Normal caliber pulmonary arteries. No central pulmonary emboli. Mediastinum/Nodes: No discrete thyroid nodules. Unremarkable esophagus. No axillary adenopathy. Bulky 3.6 x 2.9 cm left pericardiophrenic mass (series 2/image 40), increased from 2.9 x 2.3 cm. Enlarged 1.9 cm short axis left prevascular node  (series 2/image 17), increased from 1.4 cm using similar measurement technique. No hilar adenopathy. Lungs/Pleura: No pneumothorax. No right pleural effusion. New trace dependent left pleural effusion. Far inferior plaque-like posterior left pleural lesion measures 0.7 cm thickness, previously 0.8 cm, not appreciably changed (series 7/image 15). Subsolid 4.1 x 2.4 cm medial superior segment right lower lobe mass (series 6/image 51), previously 4.1  x 2.4 cm using similar measurement technique, stable. Thick irregular bandlike focus of consolidation with surrounding patchy ground-glass opacity in the right upper lobe is unchanged. Solid 2.1 x 1.7 cm left upper lobe pulmonary nodule (series 6/image 53), previously 2.4 x 1.7 cm, slightly decreased. No new significant pulmonary nodules. No acute consolidative airspace disease. Musculoskeletal: Left posterior tenth rib expansile lesion measures 2.2 cm thickness, mildly increased from 1.8 cm, with associated healed pathologic fracture. Increased sclerosis within the sclerotic ill-defined T10 vertebral body lesion extending into the right pedicle. No new focal osseous lesions in the chest. Minimal thoracic spondylosis. CT ABDOMEN PELVIS FINDINGS Hepatobiliary: Normal liver size. Several tiny subcentimeter hypodense lesions scattered throughout the liver, too small to characterize, unchanged. No new liver lesions. Normal gallbladder with no radiopaque cholelithiasis. No biliary ductal dilatation. Pancreas: Normal, with no mass or duct dilation. Spleen: Normal size. No mass. Adrenals/Urinary Tract: Normal adrenals. New 3 mm stone at the right ureterovesical junction. Minimal right hydroureteronephrosis, minimally increased from prior. Increased urothelial wall thickening and enhancement throughout the right renal collecting system and right ureter. Several nonobstructing stones in the kidneys bilaterally, largest 8 mm in the lower right kidney and 5 mm in the lower left  kidney. No overt left hydronephrosis. Hypodense 2.4 cm lateral interpolar right renal cortical lesion is stable in size. Numerous subcentimeter hypodense renal cortical lesions throughout both kidneys are too small to characterize and are not appreciably changed. Nondistended bladder with suggestion of new mild bladder wall thickening and mucosal hyperenhancement. Stomach/Bowel: Normal non-distended stomach. Normal caliber small bowel with no small bowel wall thickening. Appendix not discretely visualized. Marked sigmoid diverticulosis. Similar chronic mild wall thickening in the sigmoid colon without acute pericolonic fat stranding. Vascular/Lymphatic: Atherosclerotic nonaneurysmal abdominal aorta. Patent portal, splenic, hepatic and renal veins. No pathologically enlarged lymph nodes in the abdomen or pelvis. Reproductive: Grossly normal uterus. Stable simple benign appearing 1.2 cm right adnexal cyst. No left adnexal mass. Other: No pneumoperitoneum, ascites or focal fluid collection. Musculoskeletal: No aggressive appearing focal osseous lesions. Moderate lower lumbar spondylosis. IMPRESSION: 1. New 3 mm stone at the right ureterovesical junction, with minimal right hydroureteronephrosis, slightly increased from prior. 2. New mild diffuse bladder wall thickening and mucosal hyperenhancement. New urothelial wall thickening and hyperenhancement throughout the right renal collecting system and right ureter. Findings suggest acute cystitis with ascending right urinary tract infection. Suggest correlation with urinalysis. 3. Bulky left pericardiophrenic and left prevascular mediastinal nodal metastases have increased. 4. New trace dependent left pleural effusion. 5. Left upper lobe solid pulmonary nodule is slightly decreased. Superior segment right lower lobe subsolid lung mass is stable. 6. Increased sclerosis within the T10 spinal metastasis, nonspecific, potentially due to treatment change. Left posterior tenth  rib expansile metastasis is slightly increased in thickness. Far inferior plaque-like pleural lesion adjacent to the left twelfth rib is stable. No new osseous lesions. 7. No evidence of metastatic disease in the abdomen or pelvis. 8. Marked sigmoid diverticulosis with chronic mild sigmoid wall thickening, favoring chronic diverticulosis. 9.  Aortic Atherosclerosis (ICD10-I70.0). These results will be called to the ordering clinician or representative by the Radiologist Assistant, and communication documented in the PACS or zVision Dashboard. Electronically Signed   By: Ilona Sorrel M.D.   On: 02/15/2019 16:13   Dg Chest Portable 1 View  Result Date: 03/04/2019 CLINICAL DATA:  Shortness of breath. EXAM: PORTABLE CHEST 1 VIEW COMPARISON:  CT scan of February 15, 2019. Radiographs of August 06, 2018. FINDINGS: The heart size and  mediastinal contours are within normal limits. Interval placement of right internal jugular Port-A-Cath with distal tip in expected position of cavoatrial junction. No pneumothorax or pleural effusion is noted. Stable left upper lobe nodular density is noted. Stable right upper lobe opacity is noted consistent with consolidation or scarring. No new abnormality is noted. The visualized skeletal structures are unremarkable. IMPRESSION: Stable left upper lobe nodule is noted concerning for metastatic lesion. Stable right upper lobe opacity is noted concerning for scarring or consolidation. No significant changes noted compared to prior exam. Electronically Signed   By: Marijo Conception M.D.   On: 03/04/2019 18:24   Vas [place] Lower Extremity Venous (dvt) (only Mc & W[facility] 7a-7p)  Result Date: 02/25/2019  Lower Venous Study Indications: Possible iliac thrombosis vs. artifact by CT. No lower extremity edema.  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 Presenting with acute diverticulitis involving the proximal sigmoid colon with evidence of intramural abscess in the  wall of the sigmoid colon. Comparison Study: No prior study on file for comparison. Performing Technologist: Sharion Dove RVS  Examination Guidelines: A complete evaluation includes B-mode imaging, spectral Doppler, color Doppler, and power Doppler as needed of all accessible portions of each vessel. Bilateral testing is considered an integral part of a complete examination. Limited examinations for reoccurring indications may be performed as noted.  Right Venous Findings: +---------+---------------+---------+-----------+----------+-------+            Compressibility Phasicity Spontaneity Properties Summary  +---------+---------------+---------+-----------+----------+-------+  CFV       Full            Yes       Yes                             +---------+---------------+---------+-----------+----------+-------+  SFJ       Full                                                      +---------+---------------+---------+-----------+----------+-------+  FV Prox   Full                                                      +---------+---------------+---------+-----------+----------+-------+  FV Mid    Full                                                      +---------+---------------+---------+-----------+----------+-------+  FV Distal Full                                                      +---------+---------------+---------+-----------+----------+-------+  PFV       Full                                                      +---------+---------------+---------+-----------+----------+-------+  POP       Full            Yes       Yes                             +---------+---------------+---------+-----------+----------+-------+  PTV       Full                                                      +---------+---------------+---------+-----------+----------+-------+  PERO      Full                                                      +---------+---------------+---------+-----------+----------+-------+  Left Venous  Findings: +---------+---------------+---------+-----------+----------+-------+            Compressibility Phasicity Spontaneity Properties Summary  +---------+---------------+---------+-----------+----------+-------+  CFV       Full            Yes       Yes                             +---------+---------------+---------+-----------+----------+-------+  SFJ       Full                                                      +---------+---------------+---------+-----------+----------+-------+  FV Prox   Full                                                      +---------+---------------+---------+-----------+----------+-------+  FV Mid    Full                                                      +---------+---------------+---------+-----------+----------+-------+  FV Distal Full                                                      +---------+---------------+---------+-----------+----------+-------+  PFV       Full                                                      +---------+---------------+---------+-----------+----------+-------+  POP       Full            Yes       Yes                             +---------+---------------+---------+-----------+----------+-------+  PTV       Full                                                      +---------+---------------+---------+-----------+----------+-------+  PERO      Full                                                      +---------+---------------+---------+-----------+----------+-------+    Summary: Right: There is no evidence of deep vein thrombosis in the lower extremity. Left: There is no evidence of deep vein thrombosis in the lower extremity.  *See table(s) above for measurements and observations. Electronically signed by Monica Martinez MD on 02/25/2019 at 5:13:20 PM.    Final     ASSESSMENT AND PLAN: This is a very pleasant 68 years old white female recently diagnosed with high-grade neuroendocrine carcinoma. The patient underwent systemic chemotherapy with  small cell regimen including carboplatin, etoposide and Tecentriq status post 2 cycles. She tolerated her treatment well but unfortunately repeat CT scan of the chest, abdomen and pelvis showed evidence for disease progression. The patient was started on systemic chemotherapy with carboplatin, Alimta and Keytruda status post 4 cycles.   The patient tolerated the last cycle well. I recommended for her to proceed with the first cycle of maintenance treatment with Alimta and Keytruda. In the meantime I will refer the patient again to Dr. Durenda Hurt at [facility] for second opinion regarding her condition and other treatment options for her aggressive high-grade neuroendocrine carcinoma. For the itching she will continue with Benadryl and hydrocortisone cream topically. For the persistent back pain and T10 bone lesion with epidural extension, she underwent palliative radiotherapy to this area under the care of Dr. Isidore Moos.  She will also continue her current pain medication with oxycodone and gabapentin. For the suspicious nonocclusive blood clot at the tip of the Port-A-Cath, she was started on Coumadin 5 mg p.o. daily.  We will continue to monitor her PT/INR closely. The patient will come back for follow-up visit in 3 weeks for evaluation before starting cycle #6. She was advised to call immediately if she has any concerning symptoms in the interval. The patient voices understanding of current disease status and treatment options and is in agreement with the current care plan. All questions were answered. The patient knows to call the clinic with any problems, questions or concerns. We can certainly see the patient much sooner if necessary.  Disclaimer: This note was dictated with voice recognition software. Similar sounding words can inadvertently be transcribed and may not be corrected upon review.

## 2019-03-11 NOTE — Progress Notes (Signed)
Pt. potassium low 3.3 Diane RN to follow up with pt.

## 2019-03-11 NOTE — Progress Notes (Signed)
Mild Hypokalemia-Per Dr Julien Nordmann via voice mail to pt I instructed her to increase K+ in diet and examples of food listed.

## 2019-03-12 ENCOUNTER — Telehealth: Payer: Self-pay | Admitting: Internal Medicine

## 2019-03-12 NOTE — Telephone Encounter (Signed)
Tried to reach regarding schedule °

## 2019-03-12 NOTE — Telephone Encounter (Signed)
Faxed records to Dr. Solon Palm

## 2019-03-13 ENCOUNTER — Telehealth: Payer: Self-pay | Admitting: Internal Medicine

## 2019-03-13 NOTE — Telephone Encounter (Signed)
Copied from [place] [phone]. Topic: Quick Communication - Home Health Verbal Orders >> Mar 13, 2019  5:18 PM Percell Belt A wrote: Caller/Agency: Glenard Haring with [facility]  Callback Number: [phone] Requesting OT/PT/Skilled Nursing/Social Work/Speech Therapy: just FYI they were not able to get in touch with pt today.  Will have a delay of care

## 2019-03-14 ENCOUNTER — Telehealth: Payer: Self-pay | Admitting: Medical Oncology

## 2019-03-14 ENCOUNTER — Telehealth: Payer: Self-pay | Admitting: Internal Medicine

## 2019-03-14 NOTE — Telephone Encounter (Signed)
fyi

## 2019-03-14 NOTE — Telephone Encounter (Signed)
Requests [facility] result. Faxed response.

## 2019-03-14 NOTE — Telephone Encounter (Signed)
Sent a message to HIM for referral to [facility]

## 2019-03-15 ENCOUNTER — Telehealth: Payer: Self-pay | Admitting: *Deleted

## 2019-03-15 DIAGNOSIS — C7A8 Other malignant neuroendocrine tumors: Secondary | ICD-10-CM | POA: Diagnosis not present

## 2019-03-15 NOTE — Telephone Encounter (Signed)
Referral faxed to [facility] - Dr. Lissa Morales; [place] [phone]

## 2019-03-15 NOTE — Telephone Encounter (Signed)
Received vm message from patient stating that the rash she is experiencing has 'mushroomed all over her body'.  She had a video visit with Dr. Durenda Hurt from [facility] today and he has sent in a prescription for something for the rash.  She could not remember what the prescription is. She also states she is having increasing pain on her left side-left anterior ribcage..  She states it is very painful and it is the same kind of pain she had with previous thoracic spine mets.  She is asking if she should have a scan of some sort. Attempted call back to patient but no answer. Was able to leave vm message for patient to call back.

## 2019-03-15 NOTE — Telephone Encounter (Signed)
Received call back from patient.  She did not have much to add to conversation except that the medication Dr. Durenda Hurt called in was Atarax. She is very concerned about the new pain she mentioned and is requesting further investigation into that-such as a scan to rule out bone mets.

## 2019-03-18 ENCOUNTER — Other Ambulatory Visit: Payer: Self-pay | Admitting: Internal Medicine

## 2019-03-18 ENCOUNTER — Telehealth: Payer: Self-pay | Admitting: Medical

## 2019-03-18 ENCOUNTER — Telehealth: Payer: Self-pay | Admitting: Emergency Medicine

## 2019-03-18 ENCOUNTER — Encounter: Payer: Medicare Other | Admitting: Medical

## 2019-03-18 ENCOUNTER — Other Ambulatory Visit: Payer: Medicare Other

## 2019-03-18 DIAGNOSIS — N200 Calculus of kidney: Secondary | ICD-10-CM | POA: Diagnosis not present

## 2019-03-18 DIAGNOSIS — C3490 Malignant neoplasm of unspecified part of unspecified bronchus or lung: Secondary | ICD-10-CM

## 2019-03-18 NOTE — Telephone Encounter (Signed)
Called pt since she did not answer this am for scheduling phone call for [facility] appt at 1300.  Pt states that her rash was taken care of by a physician at [facility] she was referred to by [facility], and that she feels better with the prescriptions she received from them at this time.  Pt still concerned about L sided rib/back pain, states she would like to have another MRI to f/u on source of pain & how to possibly manage it.  MD [facility] aware and agreed to this.  Desk RN Diane will put in order for MRI to be done w/in the next few days followed by an appt to discuss her results.  Pt VU of plan and that she will receive a call from imaging to schedule her MRI.  Cancelling pt's [facility] appt for today (03/18/2019), PA Lucianne Lei & MD [facility] & pt all aware.  Pt expressed gratitude over being followed up with by [facility].

## 2019-03-18 NOTE — Telephone Encounter (Signed)
Schedule message sent. 

## 2019-03-18 NOTE — Telephone Encounter (Signed)
Scheduled appt per 5/4 sch message - unable to reach patient . Left message with appt date and time

## 2019-03-18 NOTE — Telephone Encounter (Signed)
She can come to [facility] for evaluation.

## 2019-03-20 DIAGNOSIS — K5792 Diverticulitis of intestine, part unspecified, without perforation or abscess without bleeding: Secondary | ICD-10-CM | POA: Diagnosis not present

## 2019-03-25 ENCOUNTER — Other Ambulatory Visit: Payer: Self-pay

## 2019-03-25 ENCOUNTER — Other Ambulatory Visit: Payer: Medicare Other

## 2019-03-25 ENCOUNTER — Ambulatory Visit (HOSPITAL_COMMUNITY)
Admission: RE | Admit: 2019-03-25 | Discharge: 2019-03-25 | Disposition: A | Discharge status: Home / Self Care | Payer: Medicare Other | Source: Ambulatory Visit | Attending: Internal Medicine | Admitting: Internal Medicine

## 2019-03-25 DIAGNOSIS — C3490 Malignant neoplasm of unspecified part of unspecified bronchus or lung: Secondary | ICD-10-CM

## 2019-03-25 DIAGNOSIS — M899 Disorder of bone, unspecified: Secondary | ICD-10-CM | POA: Diagnosis not present

## 2019-03-25 MED ORDER — GADOBUTROL 1 MMOL/ML IV SOLN
6.0000 mL | Freq: Once | INTRAVENOUS | Status: AC | PRN
  Administered 2019-03-25: 6 mL via INTRAVENOUS

## 2019-03-25 NOTE — Telephone Encounter (Signed)
Pt was unhappy that she was not made aware of her two mri appts until she saw it in my chart. She then called scheduling to confirm the appts.  I listened to the first recorded call from last week, the scheduler did state there were orders for thoracic and lumbar spine mri scans.  Pt stated the scheduler she s/w today was rude and would not answer her questions.  I listened to the recording and in my opinion the scheduler answered her questions in a professional manner.  I apologized, told her I would review with my team and that particular scheduler.  I gave her my name and told her to call if she has further concerns.  I gave her the office of patient experience phone #, that they want to hear patient's concerns, requests, ideas, etc.  Pt was appreciative. I offered to pick her up from her mri bc she may have transportation issues, she declined.

## 2019-03-26 ENCOUNTER — Telehealth: Payer: Self-pay | Admitting: Medical Oncology

## 2019-03-26 NOTE — Telephone Encounter (Signed)
ok 

## 2019-03-26 NOTE — Telephone Encounter (Signed)
Called pt.

## 2019-03-26 NOTE — Telephone Encounter (Signed)
MRI is better. No clear explanation for her pain. Option: 1. Referral to pain clinic 2. Keep adjusting her pain medication.

## 2019-03-26 NOTE — Telephone Encounter (Signed)
  Palpable lump-She feels a "lump" on /near her left 10th rib where ribcage curves around to side. It is painful to palpation. "I know there is something wrong in this area". She wants it  examined. She does not want to come up with a way to live with pain. She does not want pain med increased.  Difficulty swallowing when big pills hit her tongue> gagging> pills come back up. [place] recommended speech/swallow evaluation.  Itching persists -atarax not helping completely.   ?medrol dose pak.

## 2019-03-26 NOTE — Telephone Encounter (Signed)
Persistent intense pain Left ribcage at back where original tumor was in Dec. "around 10th rib".  She states that no on is addressing this pain and wants to get to the bottom of the cause and get it managed. Had MRIs of L and T spine yesterday.  Persistent gagging ,then vomiting- Trouble swallowing pills She cannot hold down her pills except one small pill.   Allergic reaction -Still itching from immunotherapy .-[place] ordered atarax.

## 2019-03-27 ENCOUNTER — Other Ambulatory Visit: Payer: Self-pay

## 2019-03-27 ENCOUNTER — Ambulatory Visit (HOSPITAL_COMMUNITY)
Admission: RE | Admit: 2019-03-27 | Discharge: 2019-03-27 | Disposition: A | Discharge status: Home / Self Care | Payer: Medicare Other | Source: Ambulatory Visit | Attending: Medical | Admitting: Medical

## 2019-03-27 ENCOUNTER — Inpatient Hospital Stay: Payer: Medicare Other | Attending: Internal Medicine | Admitting: Medical

## 2019-03-27 VITALS — BP 113/70 | HR 110 | Temp 97.5°F | Resp 18 | Ht 66.0 in | Wt 137.4 lb

## 2019-03-27 DIAGNOSIS — C7A8 Other malignant neuroendocrine tumors: Secondary | ICD-10-CM | POA: Insufficient documentation

## 2019-03-27 DIAGNOSIS — R1312 Dysphagia, oropharyngeal phase: Secondary | ICD-10-CM | POA: Diagnosis not present

## 2019-03-27 DIAGNOSIS — R0781 Pleurodynia: Secondary | ICD-10-CM | POA: Insufficient documentation

## 2019-03-27 DIAGNOSIS — Z79899 Other long term (current) drug therapy: Secondary | ICD-10-CM | POA: Insufficient documentation

## 2019-03-27 DIAGNOSIS — C7B8 Other secondary neuroendocrine tumors: Secondary | ICD-10-CM | POA: Diagnosis not present

## 2019-03-27 DIAGNOSIS — C3492 Malignant neoplasm of unspecified part of left bronchus or lung: Secondary | ICD-10-CM

## 2019-03-27 DIAGNOSIS — L299 Pruritus, unspecified: Secondary | ICD-10-CM | POA: Diagnosis not present

## 2019-03-27 DIAGNOSIS — Z5112 Encounter for antineoplastic immunotherapy: Secondary | ICD-10-CM | POA: Diagnosis not present

## 2019-03-27 DIAGNOSIS — Z5111 Encounter for antineoplastic chemotherapy: Secondary | ICD-10-CM | POA: Diagnosis not present

## 2019-03-27 DIAGNOSIS — E876 Hypokalemia: Secondary | ICD-10-CM | POA: Diagnosis not present

## 2019-03-27 DIAGNOSIS — Z7901 Long term (current) use of anticoagulants: Secondary | ICD-10-CM | POA: Insufficient documentation

## 2019-03-27 DIAGNOSIS — C7951 Secondary malignant neoplasm of bone: Secondary | ICD-10-CM | POA: Diagnosis not present

## 2019-03-27 DIAGNOSIS — Z87891 Personal history of nicotine dependence: Secondary | ICD-10-CM | POA: Insufficient documentation

## 2019-03-27 DIAGNOSIS — N189 Chronic kidney disease, unspecified: Secondary | ICD-10-CM | POA: Diagnosis not present

## 2019-03-27 MED ORDER — GABAPENTIN 600 MG PO TABS: 300.0000 mg | ORAL_TABLET | Freq: Three times a day (TID) | ORAL | 2 refills | Status: DC

## 2019-03-27 NOTE — Progress Notes (Signed)
[facility] Progress Note   Alison Sullivan [id number] 01-08-1951 68 y.o.  Alison Sullivan is managed by Dr. Fanny Bien. Alison Sullivan  Actively treated with chemotherapy/immunotherapy/hormonal therapy: yes  Current therapy: Alimta, and Keytruda  Last treated: 03/11/2019 (cycle 5, day 1)  Next scheduled appointment with provider: 04/01/2019  Assessment: Plan:    Large cell carcinoma of left lung, stage 4 (HCC)  Neuroendocrine carcinoma of the left lung  - Plan: DG Ribs Unilateral Left  Itching  Oropharyngeal dysphagia  Bone metastases ([place]) - Plan: DG Ribs Unilateral Left  Rib pain on left side - Plan: DG Ribs Unilateral Left   Metastatic neuroendocrine carcinoma of the left lung: Alison Sullivan continues to be followed by Dr. Julien Nordmann and is status post maintenance therapy with Alimta and Keytruda which was dosed on 03/11/2019.  She is scheduled to follow-up and be seen again for her next treatment on 04/01/2019.  Pruritus: She reports that her pruritus is better with her use of Atarax.  She was told that she could add Zyrtec 10 mg once daily and Allegra 60 mg p.o. twice daily if needed.   Oropharyngeal dysphasia: She reports that her swallowing is better with the way she positions her medications when her tongue with the timing of her medications.  She reports that she is having difficulty swallowing gabapentin capsules.  Based on this she has been given gabapentin tablets 600 mg with instructions to take 1/2 tablet 3 times daily as needed.  Left rib pain: Patient was referred for rib x-rays today.  These results returned showing no evidence of fracture or bony lesion.  She plans to discuss her left rib pain and fullness with Dr. Julien Nordmann at her upcoming appointment.  Please see After Visit Summary for patient specific instructions.  Future Appointments  Date Time Provider [place]  04/01/2019  8:30 AM [facility] LAB 4 [facility] None  04/01/2019   8:45 AM Curt Bears, MD [facility] None  04/01/2019  9:45 AM [facility] INFUSION [facility] None  04/22/2019  8:15 AM [facility] LAB 3 [facility] None  04/22/2019  8:45 AM Curt Bears, MD [facility] None  04/22/2019  9:30 AM [facility] INFUSION [facility] None  05/13/2019  8:15 AM [facility] LAB 4 [facility] None  05/13/2019  8:45 AM Curt Bears, MD [facility] None  05/13/2019  9:30 AM [facility] INFUSION [facility] None    Orders Placed This Encounter  Procedures   DG Ribs Unilateral Left       Subjective:   Patient ID:  Alison Sullivan is a 68 y.o. (DOB 1951-02-05) female.  Chief Complaint: No chief complaint on file.   HPI Alison Sullivan is a 68 years old female with a high-grade neuroendocrine carcinoma. She is managed by Dr. Julien Nordmann and is status post cycle 5, day 1 of maintenance treatment with Alimta, and Keytruda which was dosed on 03/11/2019 when she was last seen.  A telemedicine visit has taken place with Dr. Durenda Hurt at [facility] since her last visit.  His note is listed below:  Chief complaint: Date of diagnosis: Diagnosis: Non-small cell lung cancer  Histology: Large cell neuroendorcine  Mutation status:  Stage: Prior systemic therapy: 1. Carboplatin, etoposide and atezolizumab discontinued due to disease progression  History of present illness: I am seeing the patient at the request of Dr. Julien Nordmann for further evaluation and consideration of treatment options for large cell neuroendocrine. I explained this subtype of lung cancer has features of both smalll cell and non-small cell  lung cancer. She reports tolerating first therapy without difficulty with some nausea at first cycle and then alopecia. She is currently receiving carboplatin/pemetrexed and pembrolizumab, and has received 4 cycles and started on pemetrexed and pembrolizumab maintenance. I explained that the therapy has revealed stable disease. I explained at  this time we would continue the current therapy and plan on repeating scan after 3 or 4 cycles of maintenance therapy. I explained this would give [place] 3 scans at separate time points to assess the disease  She is having some diffuse pruritis which I explained was related to the immunotherapy. Benadryl not effective and causing sedation. I explained that we can try some atarax and if this does not work then we can try a short course of steroids. She is having trouble swallowing pills over the last couple days, and this sounds oropharyngeal rather than dysphagia. I discussed potential speech evaluation.   I discussed next steps. The next standard therapy would would docetaxel alone or with ramucirumab. I explained the docetaxel can cause fatigue, lower red and white counts, nausea and with ramucirumab are increased blood pressure, protein in the urine and coughing or GI bleeding. I discussed testing with NGS to see if there is actionable mutation. I explained the rate of lower in the large cell neuroendocrine subtype. I explained we would also consider clinical trials at the time of disease progression. This would require coming to [facility] since that would difficult to assess and coordinate through video health  Assessment/Plan: 68 year-old woman with large cell neuroendocrine  Onc-continue pemetrexed and pembrolizumab and will await next disease assessment. Would consider NGS testing and if tissue available  Pruritis will try atarax and if not consider short course of steroids for symptomatic relief Swallowing would consider speech evaluation for problems swallowing pills which developed over the last several days   The patient contacted our office yesterday with the following concerns:  1) Palpable lump-She feels a "lump" on /near her left 10th rib where ribcage curves around to side. It is painful to palpation. "I know there is something wrong in this area". She wants it  examined. She does not want to  come up with a way to live with pain. She does not want pain med increased.  An MRI of the thoracic and lumbar spine was completed on 03/25/2019 with results as follows:  Improved RIGHT T10 vertebral body lesion, most notably with no adjacent epidural tumor on the RIGHT.  Improved LEFT-sided rib lesion is measurably smaller in cross-section. No new thoracic lesions.  Unremarkable lumbar spine. No visible metastatic disease, epidural tumor, or compressive spondylosis.  Her most recent chest CT from 02/15/2019 returned showing:  Left posterior tenth rib expansile lesion measures 2.2 cm thickness, mildly increased from 1.8 cm, with associated healed pathologic fracture. Increased sclerosis within the sclerotic ill-defined T10 vertebral body lesion extending into the right pedicle. No new focal osseous lesions in the chest. Minimal thoracic spondylosis.   2) Difficulty swallowing when big pills hit her tongue> gagging> pills come back up. Dr. Gaynell Face recommended speech/swallow evaluation.  She reports that she has noted that her swallowing is better and that she has less gagging when she places her medications on a certain part of her tongue when she staggers her medications.   3) Itching persists -she reports that Atarax is beginning to help her pruritus.   Medications: I have reviewed the patient's current medications.  Allergies:  Allergies  Allergen Reactions   Contrast Media [Iodinated Diagnostic Agents]  Diarrhea and Other (See Comments)    Stomach cramps - can take IV not oral liquid    Past Medical History:  Diagnosis Date   Arthritis    Chronic kidney disease    Dog bite of wrist    History of kidney stones    History of radiation therapy 10/23/18- 11/06/18   Left lower rib mass/ 30 Gy delivered in 10 fractions 3 Gy.    Kidney infection    Left renal mass    Osteopenia    Pyelonephritis    Recurrent UTI (urinary tract infection)     Past Surgical  History:  Procedure Laterality Date   CYSTOSCOPY WITH RETROGRADE PYELOGRAM, URETEROSCOPY AND STENT PLACEMENT Left 12/28/2017   Procedure: CYSTOSCOPY WITH RETROGRADE PYELOGRAM, URETEROSCOPY AND STENT PLACEMENT, STONE BASKETRY;  Surgeon: Festus Aloe, MD;  Location: [facility];  Service: Urology;  Laterality: Left;   FINGER SURGERY     index, growth removal   HOLMIUM LASER APPLICATION Left 03/30/16   Procedure: HOLMIUM LASER APPLICATION;  Surgeon: Festus Aloe, MD;  Location: [facility];  Service: Urology;  Laterality: Left;   IR IMAGING GUIDED PORT INSERTION  10/31/2018   KIDNEY STONE SURGERY  2011 or 2012   LITHOTRIPSY  2007    Family History  Problem Relation Age of Onset   Cancer Father        pancreatic   Melanoma Father     Social History   Socioeconomic History   Marital status: Single    Spouse name: Not on file   Number of children: Not on file   Years of education: Not on file   Highest education level: Not on file  Occupational History   Not on file  Social Needs   Financial resource strain: Not on file   Food insecurity:    Worry: Not on file    Inability: Not on file   Transportation needs:    Medical: No    Non-medical: No  Tobacco Use   Smoking status: Former Smoker    Packs/day: 0.25    Years: 30.00    Pack years: 7.50    Types: Cigarettes   Smokeless tobacco: Never Used   Tobacco comment: She quit early December 2019  Substance and Sexual Activity   Alcohol use: Yes    Comment: occ   Drug use: No   Sexual activity: Not on file  Lifestyle   Physical activity:    Days per week: Not on file    Minutes per session: Not on file   Stress: Not on file  Relationships   Social connections:    Talks on phone: Not on file    Gets together: Not on file    Attends religious service: Not on file    Active member of club or organization: Not on file    Attends meetings of clubs or  organizations: Not on file    Relationship status: Not on file   Intimate partner violence:    Fear of current or ex partner: No    Emotionally abused: No    Physically abused: No    Forced sexual activity: No  Other Topics Concern   Not on file  Social History Narrative   Not on file    Past Medical History, Surgical history, Social history, and Family history were reviewed and updated as appropriate.   Please see review of systems for further details on the patient's review from today.   Review of  Systems:  Review of Systems  Constitutional: Negative for chills, diaphoresis and fever.  HENT: Positive for trouble swallowing. Negative for facial swelling and voice change.   Respiratory: Negative for cough, chest tightness, shortness of breath and wheezing.   Cardiovascular: Negative for chest pain and palpitations.  Gastrointestinal: Negative for abdominal pain, constipation, diarrhea, nausea and vomiting.  Musculoskeletal: Negative for back pain and myalgias.       Left rib mass   Skin: Negative for rash.       Itching   Neurological: Negative for dizziness, light-headedness and headaches.    Objective:   Physical Exam:  BP 113/70    Pulse (!) 110    Temp (!) 97.5 F (36.4 C) (Oral)    Resp 18    Ht 5\' 6"  (1.676 m)    Wt 137 lb 6.4 oz (62.3 kg)    SpO2 100%    BMI 22.18 kg/m  ECOG: 1  Physical Exam Constitutional:      General: She is not in acute distress.    Appearance: Normal appearance. She is not ill-appearing.  Eyes:     General: No scleral icterus.       Right eye: No discharge.        Left eye: No discharge.     Conjunctiva/sclera: Conjunctivae normal.  Musculoskeletal: Normal range of motion.        General: Tenderness present.     Comments: There is an area of tenderness and irregularity along the lower posterior lateral left ribs.  Neurological:     General: No focal deficit present.     Mental Status: She is alert.     Coordination: Coordination  normal.     Gait: Gait normal.  Psychiatric:        Mood and Affect: Mood normal.        Behavior: Behavior normal.        Thought Content: Thought content normal.        Judgment: Judgment normal.     Lab Review:     Component Value Date/Time   NA 139 03/11/2019 0935   K 3.3 (L) 03/11/2019 0935   CL 105 03/11/2019 0935   CO2 24 03/11/2019 0935   GLUCOSE 115 (H) 03/11/2019 0935   BUN 12 03/11/2019 0935   CREATININE 0.89 03/11/2019 0935   CALCIUM 8.6 (L) 03/11/2019 0935   PROT 6.8 03/11/2019 0935   ALBUMIN 3.3 (L) 03/11/2019 0935   AST 34 03/11/2019 0935   ALT 26 03/11/2019 0935   ALKPHOS 102 03/11/2019 0935   BILITOT 0.3 03/11/2019 0935   GFRNONAA >60 03/11/2019 0935   GFRAA >60 03/11/2019 0935       Component Value Date/Time   WBC 4.0 03/11/2019 0937   WBC 2.2 (L) 03/04/2019 1729   RBC 2.84 (L) 03/11/2019 0937   HGB 9.9 (L) 03/11/2019 0937   HCT 30.0 (L) 03/11/2019 0937   PLT 184 03/11/2019 0937   MCV 105.6 (H) 03/11/2019 0937   MCH 34.9 (H) 03/11/2019 0937   MCHC 33.0 03/11/2019 0937   RDW 18.9 (H) 03/11/2019 0937   LYMPHSABS 1.1 03/11/2019 0937   MONOABS 0.8 03/11/2019 0937   EOSABS 0.0 03/11/2019 0937   BASOSABS 0.0 03/11/2019 0937   -------------------------------  Imaging from last 24 hours (if applicable):  Radiology interpretation: Dg Ribs Unilateral Left  Result Date: 03/27/2019 CLINICAL DATA:  Neuroendocrine tumor left lung stage IV. Left rib pain EXAM: LEFT RIBS - 2 VIEW COMPARISON:  CT chest  03/04/2019 FINDINGS: Left upper lobe nodule appears improved compared to the prior study. Negative for acute infiltrate effusion or pneumothorax on the left. Apically pleural scarring. Port-A-Cath tip SVC Negative for left rib fracture or rib lesion. IMPRESSION: No acute abnormality.  No rib fracture or lesion on the left. Left upper lobe nodule appears smaller compared with chest CT of 03/04/2019 Electronically Signed   By: Franchot Gallo M.D.   On: 03/27/2019  15:18   Ct Angio Chest Pe W And/or Wo Contrast  Result Date: 03/04/2019 CLINICAL DATA:  Weakness, shortness of breath. Stage IV neuroendocrine lung carcinoma. EXAM: CT ANGIOGRAPHY CHEST WITH CONTRAST TECHNIQUE: Multidetector CT imaging of the chest was performed using the standard protocol during bolus administration of intravenous contrast. Multiplanar CT image reconstructions and MIPs were obtained to evaluate the vascular anatomy. CONTRAST:  111mL OMNIPAQUE IOHEXOL 350 MG/ML SOLN COMPARISON:  02/15/2019 FINDINGS: Cardiovascular: No filling defects in the pulmonary arteries to suggest pulmonary emboli. Heart is borderline in size. Scattered aortic atherosclerosis. No evidence of aortic aneurysm. Mediastinum/Nodes: Left pericardiophrenic mass again noted measuring 2.9 x 2.9 cm compared to 3.6 x 2.9 cm previously. He prevascular adenopathy measures up to 1.8 cm in short axis dimension, stable. Lungs/Pleura: Left upper lobe mass measures up to 1.8 cm compared to 2.1 cm previously. Irregular bandlike opacity with surrounding ground-glass opacities in the right upper lobe is stable since prior study. Ground-glass nodular area in the posteromedial superior segment right lower lobe also stable. No effusions. Upper Abdomen: Imaging into the upper abdomen shows no acute findings. Musculoskeletal: Chest wall soft tissues are unremarkable. Sclerotic area within T10 vertebral body and right pedicle are unchanged. The previously seen expansile lesion within the posterior left 10th rib is only partially visualized on today's study. Review of the MIP images confirms the above findings. IMPRESSION: No evidence of pulmonary embolus. Left cardiophrenic mass stable or slightly decreased in size. Prevascular adenopathy stable. Bilateral upper lobe nodules/masses and ground-glass nodular area in the superior segment of the right lower lobe are stable. Stable sclerotic lesion within the T10 vertebral body. Electronically Signed    By: Rolm Baptise M.D.   On: 03/04/2019 20:23   Mr Thoracic Spine W Wo Contrast  Result Date: 03/25/2019 CLINICAL DATA:  Stage IV neuroendocrine lung carcinoma. Staging. Status post radiation to T10 lesion and adjacent ribs EXAM: MRI THORACIC AND LUMBAR SPINE WITHOUT AND WITH CONTRAST TECHNIQUE: Multiplanar and multiecho pulse sequences of the thoracic and lumbar spine were obtained without and with intravenous contrast. CONTRAST:  Gadavist 6 mL. COMPARISON:  Multiple priors. FINDINGS: MRI THORACIC SPINE FINDINGS Alignment:  Anatomic Vertebrae: T10 lesion is redemonstrated, RIGHT hemivertebra, with improved vertebral body component as well as improved pedicle and posterior element component. The most notable finding is lack of adjacent epidural tumor. LEFT-sided rib lesion, also improved, now 6 x 16 mm cross-section compared with previous 10 x 20 mm. No new deposits are identified. Cord:  Normal in signal and morphology. Paraspinal and other soft tissues: Adenopathy and pulmonary lesions are better demonstrated on CT. Unchanged soft tissue along the ventral aspect of the LEFT twelfth rib. Disc levels: No disc protrusion is evident. MRI LUMBAR SPINE FINDINGS Segmentation:  Standard. Alignment:  Physiologic Vertebrae: No visible lumbar metastatic disease. Fatty replaced marrow. Conus medullaris: Extends to the L1 level and appears normal. Paraspinal and other soft tissues: Aortic atherosclerosis. No definite adenopathy. Disc levels: No disc protrusion, spinal stenosis, or subarticular zone/foraminal zone narrowing. Minor multilevel facet arthropathy. IMPRESSION: Improved RIGHT  T10 vertebral body lesion, most notably with no adjacent epidural tumor on the RIGHT. Improved LEFT-sided rib lesion is measurably smaller in cross-section. No new thoracic lesions. Unremarkable lumbar spine. No visible metastatic disease, epidural tumor, or compressive spondylosis. Electronically Signed   By: Staci Righter M.D.   On:  03/25/2019 14:47   Mr Lumbar Spine W Wo Contrast  Result Date: 03/25/2019 CLINICAL DATA:  Stage IV neuroendocrine lung carcinoma. Staging. Status post radiation to T10 lesion and adjacent ribs EXAM: MRI THORACIC AND LUMBAR SPINE WITHOUT AND WITH CONTRAST TECHNIQUE: Multiplanar and multiecho pulse sequences of the thoracic and lumbar spine were obtained without and with intravenous contrast. CONTRAST:  Gadavist 6 mL. COMPARISON:  Multiple priors. FINDINGS: MRI THORACIC SPINE FINDINGS Alignment:  Anatomic Vertebrae: T10 lesion is redemonstrated, RIGHT hemivertebra, with improved vertebral body component as well as improved pedicle and posterior element component. The most notable finding is lack of adjacent epidural tumor. LEFT-sided rib lesion, also improved, now 6 x 16 mm cross-section compared with previous 10 x 20 mm. No new deposits are identified. Cord:  Normal in signal and morphology. Paraspinal and other soft tissues: Adenopathy and pulmonary lesions are better demonstrated on CT. Unchanged soft tissue along the ventral aspect of the LEFT twelfth rib. Disc levels: No disc protrusion is evident. MRI LUMBAR SPINE FINDINGS Segmentation:  Standard. Alignment:  Physiologic Vertebrae: No visible lumbar metastatic disease. Fatty replaced marrow. Conus medullaris: Extends to the L1 level and appears normal. Paraspinal and other soft tissues: Aortic atherosclerosis. No definite adenopathy. Disc levels: No disc protrusion, spinal stenosis, or subarticular zone/foraminal zone narrowing. Minor multilevel facet arthropathy. IMPRESSION: Improved RIGHT T10 vertebral body lesion, most notably with no adjacent epidural tumor on the RIGHT. Improved LEFT-sided rib lesion is measurably smaller in cross-section. No new thoracic lesions. Unremarkable lumbar spine. No visible metastatic disease, epidural tumor, or compressive spondylosis. Electronically Signed   By: Staci Righter M.D.   On: 03/25/2019 14:47   Ct Abdomen  Pelvis W Contrast  Result Date: 02/26/2019 CLINICAL DATA:  Follow-up diverticulitis EXAM: CT ABDOMEN AND PELVIS WITH CONTRAST TECHNIQUE: Multidetector CT imaging of the abdomen and pelvis was performed using the standard protocol following bolus administration of intravenous contrast. CONTRAST:  174mL OMNIPAQUE IOHEXOL 300 MG/ML  SOLN COMPARISON:  CT abdomen pelvis, 02/23/2019 FINDINGS: Lower chest: No acute abnormality. Redemonstrated necrotic epicardial lymph node about the cardiac apex (series 2, image 16). Hepatobiliary: No focal liver abnormality is seen. No gallstones, gallbladder wall thickening, or biliary dilatation. Pancreas: Unremarkable. No pancreatic ductal dilatation or surrounding inflammatory changes. Spleen: Normal in size without focal abnormality. Adrenals/Urinary Tract: Adrenal glands are unremarkable. Numerous nonobstructive small bilateral renal calculi. Bladder is unremarkable. Stomach/Bowel: Stomach is within normal limits. Appendix appears normal. Severe sigmoid diverticulosis with mild thickening of the proximal to mid sigmoid wall, slightly improved, and a redemonstrated intramural abscess at the left aspect of the mid sigmoid (series 2, image 76). This is not significantly changed compared to prior examination and measures approximately 1.8 cm. Vascular/Lymphatic: Severe mixed calcific atherosclerosis of the abdominal aorta and branch vasculature no enlarged abdominal or pelvic lymph nodes. Reproductive: No mass or other abnormality. Other: No abdominal wall hernia or abnormality. No abdominopelvic ascites. Musculoskeletal: No acute osseous findings. Redemonstrated sclerotic lesion of the T10 vertebral body and soft tissue lesion of the left tenth rib. IMPRESSION: 1. Severe sigmoid diverticulosis with mild thickening of the proximal to mid sigmoid wall, slightly improved, and a redemonstrated intramural abscess at the left aspect of  the mid sigmoid (series 2, image 76). This is not  significantly changed compared to prior examination and measures approximately 1.8 cm. 2. Other chronic and incidental findings as detailed above, including known metastatic neuroendocrine malignancy. Electronically Signed   By: Eddie Candle M.D.   On: 02/26/2019 12:55   Dg Chest Portable 1 View  Result Date: 03/04/2019 CLINICAL DATA:  Shortness of breath. EXAM: PORTABLE CHEST 1 VIEW COMPARISON:  CT scan of February 15, 2019. Radiographs of August 06, 2018. FINDINGS: The heart size and mediastinal contours are within normal limits. Interval placement of right internal jugular Port-A-Cath with distal tip in expected position of cavoatrial junction. No pneumothorax or pleural effusion is noted. Stable left upper lobe nodular density is noted. Stable right upper lobe opacity is noted consistent with consolidation or scarring. No new abnormality is noted. The visualized skeletal structures are unremarkable. IMPRESSION: Stable left upper lobe nodule is noted concerning for metastatic lesion. Stable right upper lobe opacity is noted concerning for scarring or consolidation. No significant changes noted compared to prior exam. Electronically Signed   By: Marijo Conception M.D.   On: 03/04/2019 18:24        This case was discussed with Dr. Julien Nordmann. He expressed agreement with my management of this patient.

## 2019-03-28 ENCOUNTER — Other Ambulatory Visit: Payer: Self-pay | Admitting: Medical Oncology

## 2019-03-28 DIAGNOSIS — C3492 Malignant neoplasm of unspecified part of left bronchus or lung: Secondary | ICD-10-CM

## 2019-04-01 ENCOUNTER — Encounter: Payer: Self-pay | Admitting: Internal Medicine

## 2019-04-01 ENCOUNTER — Telehealth: Payer: Self-pay | Admitting: Internal Medicine

## 2019-04-01 ENCOUNTER — Other Ambulatory Visit: Payer: Self-pay | Admitting: Medical Oncology

## 2019-04-01 ENCOUNTER — Other Ambulatory Visit: Payer: Self-pay

## 2019-04-01 ENCOUNTER — Inpatient Hospital Stay: Payer: Medicare Other

## 2019-04-01 ENCOUNTER — Inpatient Hospital Stay (HOSPITAL_BASED_OUTPATIENT_CLINIC_OR_DEPARTMENT_OTHER): Payer: Medicare Other | Admitting: Internal Medicine

## 2019-04-01 VITALS — HR 100

## 2019-04-01 VITALS — BP 90/67 | HR 108 | Temp 98.2°F | Resp 17 | Ht 66.0 in | Wt 140.0 lb

## 2019-04-01 DIAGNOSIS — C7B8 Other secondary neuroendocrine tumors: Secondary | ICD-10-CM | POA: Diagnosis not present

## 2019-04-01 DIAGNOSIS — C7951 Secondary malignant neoplasm of bone: Secondary | ICD-10-CM

## 2019-04-01 DIAGNOSIS — C3492 Malignant neoplasm of unspecified part of left bronchus or lung: Secondary | ICD-10-CM

## 2019-04-01 DIAGNOSIS — C7A8 Other malignant neuroendocrine tumors: Secondary | ICD-10-CM

## 2019-04-01 DIAGNOSIS — C3490 Malignant neoplasm of unspecified part of unspecified bronchus or lung: Secondary | ICD-10-CM

## 2019-04-01 DIAGNOSIS — E876 Hypokalemia: Secondary | ICD-10-CM | POA: Diagnosis not present

## 2019-04-01 DIAGNOSIS — Z5112 Encounter for antineoplastic immunotherapy: Secondary | ICD-10-CM | POA: Diagnosis not present

## 2019-04-01 DIAGNOSIS — N189 Chronic kidney disease, unspecified: Secondary | ICD-10-CM | POA: Diagnosis not present

## 2019-04-01 DIAGNOSIS — T85818D Embolism due to other internal prosthetic devices, implants and grafts, subsequent encounter: Secondary | ICD-10-CM

## 2019-04-01 DIAGNOSIS — Z5111 Encounter for antineoplastic chemotherapy: Secondary | ICD-10-CM | POA: Diagnosis not present

## 2019-04-01 DIAGNOSIS — R5382 Chronic fatigue, unspecified: Secondary | ICD-10-CM

## 2019-04-01 DIAGNOSIS — R1312 Dysphagia, oropharyngeal phase: Secondary | ICD-10-CM | POA: Diagnosis not present

## 2019-04-01 DIAGNOSIS — L299 Pruritus, unspecified: Secondary | ICD-10-CM | POA: Diagnosis not present

## 2019-04-01 LAB — CMP (CANCER CENTER ONLY)
ALT: 57 U/L — ABNORMAL HIGH (ref 0–44)
AST: 72 U/L — ABNORMAL HIGH (ref 15–41)
Albumin: 2.7 g/dL — ABNORMAL LOW (ref 3.5–5.0)
Alkaline Phosphatase: 108 U/L (ref 38–126)
Anion gap: 10 (ref 5–15)
BUN: 10 mg/dL (ref 8–23)
CO2: 22 mmol/L (ref 22–32)
Calcium: 7.8 mg/dL — ABNORMAL LOW (ref 8.9–10.3)
Chloride: 108 mmol/L (ref 98–111)
Creatinine: 0.83 mg/dL (ref 0.44–1.00)
GFR, Est AFR Am: >60 mL/min
GFR, Estimated: >60 mL/min
Glucose, Bld: 141 mg/dL — ABNORMAL HIGH (ref 70–99)
Potassium: 2.8 mmol/L — CL (ref 3.5–5.1)
Sodium: 140 mmol/L (ref 135–145)
Total Bilirubin: 0.4 mg/dL (ref 0.3–1.2)
Total Protein: 6.2 g/dL — ABNORMAL LOW (ref 6.5–8.1)

## 2019-04-01 LAB — CBC WITH DIFFERENTIAL (CANCER CENTER ONLY)
Abs Immature Granulocytes: 0.03 K/uL (ref 0.00–0.07)
Basophils Absolute: 0 K/uL (ref 0.0–0.1)
Basophils Relative: 0 %
Eosinophils Absolute: 0.2 K/uL (ref 0.0–0.5)
Eosinophils Relative: 5 %
HCT: 25.9 % — ABNORMAL LOW (ref 36.0–46.0)
Hemoglobin: 8.5 g/dL — ABNORMAL LOW (ref 12.0–15.0)
Immature Granulocytes: 1 %
Lymphocytes Relative: 26 %
Lymphs Abs: 1.1 K/uL (ref 0.7–4.0)
MCH: 35.1 pg — ABNORMAL HIGH (ref 26.0–34.0)
MCHC: 32.8 g/dL (ref 30.0–36.0)
MCV: 107 fL — ABNORMAL HIGH (ref 80.0–100.0)
Monocytes Absolute: 0.8 K/uL (ref 0.1–1.0)
Monocytes Relative: 18 %
Neutro Abs: 2.2 K/uL (ref 1.7–7.7)
Neutrophils Relative %: 50 %
Platelet Count: 238 K/uL (ref 150–400)
RBC: 2.42 MIL/uL — ABNORMAL LOW (ref 3.87–5.11)
RDW: 18.9 % — ABNORMAL HIGH (ref 11.5–15.5)
WBC Count: 4.4 K/uL (ref 4.0–10.5)
nRBC: 0 % (ref 0.0–0.2)

## 2019-04-01 LAB — PROTIME-INR
INR: 1.7 — ABNORMAL HIGH (ref 0.8–1.2)
Prothrombin Time: 19.5 seconds — ABNORMAL HIGH (ref 11.4–15.2)

## 2019-04-01 LAB — TSH: TSH: 0.539 u[IU]/mL (ref 0.308–3.960)

## 2019-04-01 MED ORDER — SODIUM CHLORIDE 0.9% FLUSH
10.0000 mL | INTRAVENOUS | Status: DC | PRN
  Administered 2019-04-01: 10 mL
  Filled 2019-04-01: qty 10

## 2019-04-01 MED ORDER — SODIUM CHLORIDE 0.9 % IV SOLN
520.0000 mg/m2 | Freq: Once | INTRAVENOUS | Status: AC
  Administered 2019-04-01: 900 mg via INTRAVENOUS
  Filled 2019-04-01: qty 20

## 2019-04-01 MED ORDER — POTASSIUM CHLORIDE 10 MEQ/100ML IV SOLN
10.0000 meq | INTRAVENOUS | Status: AC
  Administered 2019-04-01 (×2): 10 meq via INTRAVENOUS
  Filled 2019-04-01: qty 100

## 2019-04-01 MED ORDER — SODIUM CHLORIDE 0.9 % IV SOLN
200.0000 mg | Freq: Once | INTRAVENOUS | Status: AC
  Administered 2019-04-01: 11:00:00 200 mg via INTRAVENOUS
  Filled 2019-04-01: qty 8

## 2019-04-01 MED ORDER — POTASSIUM CHLORIDE 40 MEQ/15ML (20%) PO SOLN: 40.0000 meq | Freq: Every day | ORAL | 0 refills | Status: DC

## 2019-04-01 MED ORDER — SODIUM CHLORIDE 0.9 % IV SOLN
Freq: Once | INTRAVENOUS | Status: AC
  Administered 2019-04-01: 10:00:00 via INTRAVENOUS
  Filled 2019-04-01: qty 250

## 2019-04-01 MED ORDER — CYANOCOBALAMIN 1000 MCG/ML IJ SOLN
INTRAMUSCULAR | Status: AC
  Filled 2019-04-01: qty 1

## 2019-04-01 MED ORDER — PROCHLORPERAZINE MALEATE 10 MG PO TABS
10.0000 mg | ORAL_TABLET | Freq: Once | ORAL | Status: AC
  Administered 2019-04-01: 10 mg via ORAL

## 2019-04-01 MED ORDER — HEPARIN SOD (PORK) LOCK FLUSH 100 UNIT/ML IV SOLN
500.0000 [IU] | Freq: Once | INTRAVENOUS | Status: AC | PRN
  Administered 2019-04-01: 13:00:00 500 [IU]
  Filled 2019-04-01: qty 5

## 2019-04-01 MED ORDER — PROCHLORPERAZINE MALEATE 10 MG PO TABS
ORAL_TABLET | ORAL | Status: AC
  Filled 2019-04-01: qty 1

## 2019-04-01 NOTE — Progress Notes (Signed)
Per Dr. Julien Nordmann patient to receive K+ 2mEq IV today with potassium labs resulting at 2.8. An oral Rx was also sent to patient pharmacy.  Jalene Mullet, PharmD PGY2 Hematology/ Oncology Pharmacy Resident 04/01/2019 10:39 AM

## 2019-04-01 NOTE — Telephone Encounter (Signed)
Added additional cycles per 5/18 los - pt to get an updated schedule in the treatment area.

## 2019-04-01 NOTE — Patient Instructions (Signed)
[facility] Discharge Instructions for Patients Receiving Chemotherapy  Today you received the following chemotherapy agents Pembrolizumab (KEYTRUDA) & Pemetrexed (ALIMTA).  To help prevent nausea and vomiting after your treatment, we encourage you to take your nausea medication as prescribed.   If you develop nausea and vomiting that is not controlled by your nausea medication, call the clinic.   BELOW ARE SYMPTOMS THAT SHOULD BE REPORTED IMMEDIATELY:  *FEVER GREATER THAN 100.5 F  *CHILLS WITH OR WITHOUT FEVER  NAUSEA AND VOMITING THAT IS NOT CONTROLLED WITH YOUR NAUSEA MEDICATION  *UNUSUAL SHORTNESS OF BREATH  *UNUSUAL BRUISING OR BLEEDING  TENDERNESS IN MOUTH AND THROAT WITH OR WITHOUT PRESENCE OF ULCERS  *URINARY PROBLEMS  *BOWEL PROBLEMS  UNUSUAL RASH Items with * indicate a potential emergency and should be followed up as soon as possible.  Feel free to call the clinic should you have any questions or concerns. The clinic phone number is [phone].  Please show the [facility] at check-in to the Emergency Department and triage nurse.  Coronavirus (COVID-19) Are you at risk?  Are you at risk for the Coronavirus (COVID-19)?  To be considered HIGH RISK for Coronavirus (COVID-19), you have to meet the following criteria:  . Traveled to [place], [place], [place], [place] or [place]; or in the [place] to [place], [place], [place], or [place]; and have fever, cough, and shortness of breath within the last 2 weeks of travel OR . Been in close contact with a person diagnosed with COVID-19 within the last 2 weeks and have fever, cough, and shortness of breath . IF YOU DO NOT MEET THESE CRITERIA, YOU ARE CONSIDERED LOW RISK FOR COVID-19.  What to do if you are HIGH RISK for COVID-19?  Marland Kitchen If you are having a medical emergency, call 911. . Seek medical care right away. Before you go to a doctor's office, urgent care or emergency department,  call ahead and tell them about your recent travel, contact with someone diagnosed with COVID-19, and your symptoms. You should receive instructions from your physician's office regarding next steps of care.  . When you arrive at healthcare provider, tell the healthcare staff immediately you have returned from visiting [place], [place], [place], [place] or [place]; or traveled in the [place] to [place], [place], [place], or [place]; in the last two weeks or you have been in close contact with a person diagnosed with COVID-19 in the last 2 weeks.   . Tell the health care staff about your symptoms: fever, cough and shortness of breath. . After you have been seen by a medical provider, you will be either: o Tested for (COVID-19) and discharged home on quarantine except to seek medical care if symptoms worsen, and asked to  - Stay home and avoid contact with others until you get your results (4-5 days)  - Avoid travel on public transportation if possible (such as bus, train, or airplane) or o Sent to the Emergency Department by EMS for evaluation, COVID-19 testing, and possible admission depending on your condition and test results.  What to do if you are LOW RISK for COVID-19?  Reduce your risk of any infection by using the same precautions used for avoiding the common cold or flu:  Marland Kitchen Wash your hands often with soap and warm water for at least 20 seconds.  If soap and water are not readily available, use an alcohol-based hand sanitizer with at least 60% alcohol.  Marland Kitchen  If coughing or sneezing, cover your mouth and nose by coughing or sneezing into the elbow areas of your shirt or coat, into a tissue or into your sleeve (not your hands). . Avoid shaking hands with others and consider head nods or verbal greetings only. . Avoid touching your eyes, nose, or mouth with unwashed hands.  . Avoid close contact with people who are sick. . Avoid places or events with large numbers of people in one  location, like concerts or sporting events. . Carefully consider travel plans you have or are making. . If you are planning any travel outside or inside the [place], visit the CDC's Travelers' Health webpage for the latest health notices. . If you have some symptoms but not all symptoms, continue to monitor at home and seek medical attention if your symptoms worsen. . If you are having a medical emergency, call 911.   [facility] / e-Visit: [email or web address]         [facility]: [facility]: [phone]                   [facility]: [phone]

## 2019-04-01 NOTE — Progress Notes (Signed)
[name]   [phone]  OFFICE PROGRESS NOTE  [name], MD [name] [address]  DIAGNOSIS: Stage IV (T1b, N2, M1c) 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  She presented with left upper lobe lung nodule in addition to mediastinal lymphadenopathy as well as right upper lobe as well as metastatic bone disease to the sixth and 10th rib.  The patient has significant pain from her metastatic rib lesions.  PRIOR THERAPY:  1) systemic chemotherapy with carboplatin for AUC of 5 on day 1, etoposide 100 mg/M2 on days 1, 2 and 3 as well as Tecentriq 1200 mg IV every 3 weeks with Neulasta support.  Status post 2 cycles, discontinued secondary to disease progression. 2) palliative radiotherapy to progressive T10 vertebral body bone lesion under the care of Dr. Isidore Moos .  CURRENT THERAPY: Systemic chemotherapy with carboplatin for AUC of 5, Alimta 500 mg/M2 and Keytruda 200 mg IV every 3 weeks.  First dose December 17, 2018.  Status post 5 cycles.  INTERVAL HISTORY: Alison Sullivan 68 y.o. female returns to the clinic today for follow-up visit.  The patient is feeling fine today with no concerning complaints except for itching and she is currently on Atarax with some improvement.  She also continues to have the left lower rib back pain likely from the expansile lesion at the left 10th rib area.  She had MRI of the thoracolumbar spine as well as chest x-ray recently that were unremarkable and did not show any concerning abnormalities and actually showed improvement of the previous disease.  The patient denied having any current shortness of breath, cough or hemoptysis.  She denied having any weight loss or night sweats.  She has no nausea, vomiting, diarrhea or constipation.  She continues to tolerate her treatment fairly well.  She is here for evaluation before  starting cycle #6.  MEDICAL HISTORY: Past Medical History:  Diagnosis Date   Arthritis    Chronic kidney disease    Dog bite of wrist    History of kidney stones    History of radiation therapy 10/23/18- 11/06/18   Left lower rib mass/ 30 Gy delivered in 10 fractions 3 Gy.    Kidney infection    Left renal mass    Osteopenia    Pyelonephritis    Recurrent UTI (urinary tract infection)     ALLERGIES:  is allergic to contrast media [iodinated diagnostic agents].  MEDICATIONS:  Current Outpatient Medications  Medication Sig Dispense Refill   folic acid (FOLVITE) 1 MG tablet Take 1 tablet (1 mg total) by mouth daily. (Patient taking differently: Take 1 mg by mouth every evening. ) 30 tablet 4   gabapentin (NEURONTIN) 600 MG tablet Take 0.5 tablets (300 mg total) by mouth 3 (three) times daily. 135 tablet 2   lidocaine (XYLOCAINE) 2 % solution Use as directed 5 mLs in the mouth or throat every 3 (three) hours as needed for mouth pain. May mix with Mylanta before taking if desired. (Patient not taking: Reported on 03/11/2019) 200 mL 1   lidocaine-prilocaine (EMLA) cream Apply 1 application topically as needed. (Patient taking differently: Apply 1 application topically as needed (For port-a-cath.). ) 30 g 0   Melatonin 10 MG TABS Take 10 mg by mouth at bedtime.      omeprazole (PRILOSEC) 40 MG capsule Take 1 capsule (40 mg total) by mouth daily. (  Patient taking differently: Take 40 mg by mouth every evening. ) 30 capsule 2   oxyCODONE (OXY IR/ROXICODONE) 5 MG immediate release tablet Take 1-2 tablets (5-10 mg total) by mouth every 4 (four) hours as needed for severe pain. 120 tablet 0   Potassium Chloride 40 MEQ/15ML (20%) SOLN Take 40 mEq by mouth daily for 4 days. 60 mL 0   prochlorperazine (COMPAZINE) 10 MG tablet Take 1 tablet (10 mg total) by mouth every 6 (six) hours as needed for nausea or vomiting. 30 tablet 0   scopolamine (TRANSDERM-SCOP) 1 MG/3DAYS Place 1 patch  (1.5 mg total) onto the skin every 3 (three) days. (Patient not taking: Reported on 03/11/2019) 10 patch 12   simvastatin (ZOCOR) 20 MG tablet Take 1 tablet (20 mg total) by mouth daily at 6 PM. Need annual appointment for further refills (Patient taking differently: Take 20 mg by mouth every evening. ) 90 tablet 0   warfarin (COUMADIN) 5 MG tablet TAKE 1 TABLET(5 MG) BY MOUTH DAILY (Patient taking differently: Take 2.5-5 mg by mouth every evening. Take half a tablet on Monday and Thursday and one tablet the rest of the week.) 90 tablet 1   No current facility-administered medications for this visit.     SURGICAL HISTORY:  Past Surgical History:  Procedure Laterality Date   CYSTOSCOPY WITH RETROGRADE PYELOGRAM, URETEROSCOPY AND STENT PLACEMENT Left 12/28/2017   Procedure: CYSTOSCOPY WITH RETROGRADE PYELOGRAM, URETEROSCOPY AND STENT PLACEMENT, STONE BASKETRY;  Surgeon: Festus Aloe, MD;  Location: [facility];  Service: Urology;  Laterality: Left;   FINGER SURGERY     index, growth removal   HOLMIUM LASER APPLICATION Left 02/29/4080   Procedure: HOLMIUM LASER APPLICATION;  Surgeon: Festus Aloe, MD;  Location[facility];  Service: Urology;  Laterality: Left;   IR IMAGING GUIDED PORT INSERTION  10/31/2018   KIDNEY STONE SURGERY  2011 or 2012   LITHOTRIPSY  2007    REVIEW OF SYSTEMS:  A comprehensive review of systems was negative except for: Respiratory: positive for pleurisy/chest pain Integument/breast: positive for pruritus   PHYSICAL EXAMINATION: General appearance: alert, cooperative, fatigued and no distress Head: Normocephalic, without obvious abnormality, atraumatic Neck: no adenopathy, no JVD, supple, symmetrical, trachea midline and thyroid not enlarged, symmetric, no tenderness/mass/nodules Lymph nodes: Cervical, supraclavicular, and axillary nodes normal. Resp: clear to auscultation bilaterally Back: symmetric, no curvature. ROM  normal. No CVA tenderness. Cardio: regular rate and rhythm, S1, S2 normal, no murmur, click, rub or gallop GI: soft, non-tender; bowel sounds normal; no masses,  no organomegaly Extremities: extremities normal, atraumatic, no cyanosis or edema  ECOG PERFORMANCE STATUS: 1 - Symptomatic but completely ambulatory  Blood pressure 90/67, pulse (!) 108, temperature 98.2 F (36.8 C), temperature source Oral, resp. rate 17, height 5\' 6"  (1.676 m), weight 140 lb (63.5 kg), SpO2 99 %.  LABORATORY DATA: Lab Results  Component Value Date   WBC 4.0 03/11/2019   HGB 9.9 (L) 03/11/2019   HCT 30.0 (L) 03/11/2019   MCV 105.6 (H) 03/11/2019   PLT 184 03/11/2019      Chemistry      Component Value Date/Time   NA 139 03/11/2019 0935   K 3.3 (L) 03/11/2019 0935   CL 105 03/11/2019 0935   CO2 24 03/11/2019 0935   BUN 12 03/11/2019 0935   CREATININE 0.89 03/11/2019 0935      Component Value Date/Time   CALCIUM 8.6 (L) 03/11/2019 0935   ALKPHOS 102 03/11/2019 0935   AST  34 03/11/2019 0935   ALT 26 03/11/2019 0935   BILITOT 0.3 03/11/2019 0935       RADIOGRAPHIC STUDIES: Dg Ribs Unilateral Left  Result Date: 03/27/2019 CLINICAL DATA:  Neuroendocrine tumor left lung stage IV. Left rib pain EXAM: LEFT RIBS - 2 VIEW COMPARISON:  CT chest 03/04/2019 FINDINGS: Left upper lobe nodule appears improved compared to the prior study. Negative for acute infiltrate effusion or pneumothorax on the left. Apically pleural scarring. Port-A-Cath tip SVC Negative for left rib fracture or rib lesion. IMPRESSION: No acute abnormality.  No rib fracture or lesion on the left. Left upper lobe nodule appears smaller compared with chest CT of 03/04/2019 Electronically Signed   By: Franchot Gallo M.D.   On: 03/27/2019 15:18   Ct Angio Chest Pe W And/or Wo Contrast  Result Date: 03/04/2019 CLINICAL DATA:  Weakness, shortness of breath. Stage IV neuroendocrine lung carcinoma. EXAM: CT ANGIOGRAPHY CHEST WITH CONTRAST  TECHNIQUE: Multidetector CT imaging of the chest was performed using the standard protocol during bolus administration of intravenous contrast. Multiplanar CT image reconstructions and MIPs were obtained to evaluate the vascular anatomy. CONTRAST:  141mL OMNIPAQUE IOHEXOL 350 MG/ML SOLN COMPARISON:  02/15/2019 FINDINGS: Cardiovascular: No filling defects in the pulmonary arteries to suggest pulmonary emboli. Heart is borderline in size. Scattered aortic atherosclerosis. No evidence of aortic aneurysm. Mediastinum/Nodes: Left pericardiophrenic mass again noted measuring 2.9 x 2.9 cm compared to 3.6 x 2.9 cm previously. He prevascular adenopathy measures up to 1.8 cm in short axis dimension, stable. Lungs/Pleura: Left upper lobe mass measures up to 1.8 cm compared to 2.1 cm previously. Irregular bandlike opacity with surrounding ground-glass opacities in the right upper lobe is stable since prior study. Ground-glass nodular area in the posteromedial superior segment right lower lobe also stable. No effusions. Upper Abdomen: Imaging into the upper abdomen shows no acute findings. Musculoskeletal: Chest wall soft tissues are unremarkable. Sclerotic area within T10 vertebral body and right pedicle are unchanged. The previously seen expansile lesion within the posterior left 10th rib is only partially visualized on today's study. Review of the MIP images confirms the above findings. IMPRESSION: No evidence of pulmonary embolus. Left cardiophrenic mass stable or slightly decreased in size. Prevascular adenopathy stable. Bilateral upper lobe nodules/masses and ground-glass nodular area in the superior segment of the right lower lobe are stable. Stable sclerotic lesion within the T10 vertebral body. Electronically Signed   By: Rolm Baptise M.D.   On: 03/04/2019 20:23   Mr Thoracic Spine W Wo Contrast  Result Date: 03/25/2019 CLINICAL DATA:  Stage IV neuroendocrine lung carcinoma. Staging. Status post radiation to T10  lesion and adjacent ribs EXAM: MRI THORACIC AND LUMBAR SPINE WITHOUT AND WITH CONTRAST TECHNIQUE: Multiplanar and multiecho pulse sequences of the thoracic and lumbar spine were obtained without and with intravenous contrast. CONTRAST:  Gadavist 6 mL. COMPARISON:  Multiple priors. FINDINGS: MRI THORACIC SPINE FINDINGS Alignment:  Anatomic Vertebrae: T10 lesion is redemonstrated, RIGHT hemivertebra, with improved vertebral body component as well as improved pedicle and posterior element component. The most notable finding is lack of adjacent epidural tumor. LEFT-sided rib lesion, also improved, now 6 x 16 mm cross-section compared with previous 10 x 20 mm. No new deposits are identified. Cord:  Normal in signal and morphology. Paraspinal and other soft tissues: Adenopathy and pulmonary lesions are better demonstrated on CT. Unchanged soft tissue along the ventral aspect of the LEFT twelfth rib. Disc levels: No disc protrusion is evident. MRI LUMBAR SPINE FINDINGS Segmentation:  Standard. Alignment:  Physiologic Vertebrae: No visible lumbar metastatic disease. Fatty replaced marrow. Conus medullaris: Extends to the L1 level and appears normal. Paraspinal and other soft tissues: Aortic atherosclerosis. No definite adenopathy. Disc levels: No disc protrusion, spinal stenosis, or subarticular zone/foraminal zone narrowing. Minor multilevel facet arthropathy. IMPRESSION: Improved RIGHT T10 vertebral body lesion, most notably with no adjacent epidural tumor on the RIGHT. Improved LEFT-sided rib lesion is measurably smaller in cross-section. No new thoracic lesions. Unremarkable lumbar spine. No visible metastatic disease, epidural tumor, or compressive spondylosis. Electronically Signed   By: Staci Righter M.D.   On: 03/25/2019 14:47   Mr Lumbar Spine W Wo Contrast  Result Date: 03/25/2019 CLINICAL DATA:  Stage IV neuroendocrine lung carcinoma. Staging. Status post radiation to T10 lesion and adjacent ribs EXAM: MRI  THORACIC AND LUMBAR SPINE WITHOUT AND WITH CONTRAST TECHNIQUE: Multiplanar and multiecho pulse sequences of the thoracic and lumbar spine were obtained without and with intravenous contrast. CONTRAST:  Gadavist 6 mL. COMPARISON:  Multiple priors. FINDINGS: MRI THORACIC SPINE FINDINGS Alignment:  Anatomic Vertebrae: T10 lesion is redemonstrated, RIGHT hemivertebra, with improved vertebral body component as well as improved pedicle and posterior element component. The most notable finding is lack of adjacent epidural tumor. LEFT-sided rib lesion, also improved, now 6 x 16 mm cross-section compared with previous 10 x 20 mm. No new deposits are identified. Cord:  Normal in signal and morphology. Paraspinal and other soft tissues: Adenopathy and pulmonary lesions are better demonstrated on CT. Unchanged soft tissue along the ventral aspect of the LEFT twelfth rib. Disc levels: No disc protrusion is evident. MRI LUMBAR SPINE FINDINGS Segmentation:  Standard. Alignment:  Physiologic Vertebrae: No visible lumbar metastatic disease. Fatty replaced marrow. Conus medullaris: Extends to the L1 level and appears normal. Paraspinal and other soft tissues: Aortic atherosclerosis. No definite adenopathy. Disc levels: No disc protrusion, spinal stenosis, or subarticular zone/foraminal zone narrowing. Minor multilevel facet arthropathy. IMPRESSION: Improved RIGHT T10 vertebral body lesion, most notably with no adjacent epidural tumor on the RIGHT. Improved LEFT-sided rib lesion is measurably smaller in cross-section. No new thoracic lesions. Unremarkable lumbar spine. No visible metastatic disease, epidural tumor, or compressive spondylosis. Electronically Signed   By: Staci Righter M.D.   On: 03/25/2019 14:47   Dg Chest Portable 1 View  Result Date: 03/04/2019 CLINICAL DATA:  Shortness of breath. EXAM: PORTABLE CHEST 1 VIEW COMPARISON:  CT scan of February 15, 2019. Radiographs of August 06, 2018. FINDINGS: The heart size and  mediastinal contours are within normal limits. Interval placement of right internal jugular Port-A-Cath with distal tip in expected position of cavoatrial junction. No pneumothorax or pleural effusion is noted. Stable left upper lobe nodular density is noted. Stable right upper lobe opacity is noted consistent with consolidation or scarring. No new abnormality is noted. The visualized skeletal structures are unremarkable. IMPRESSION: Stable left upper lobe nodule is noted concerning for metastatic lesion. Stable right upper lobe opacity is noted concerning for scarring or consolidation. No significant changes noted compared to prior exam. Electronically Signed   By: Marijo Conception M.D.   On: 03/04/2019 18:24    ASSESSMENT AND PLAN: This is a very pleasant 68 years old white female recently diagnosed with high-grade neuroendocrine carcinoma. The patient underwent systemic chemotherapy with small cell regimen including carboplatin, etoposide and Tecentriq status post 2 cycles. She tolerated her treatment well but unfortunately repeat CT scan of the chest, abdomen and pelvis showed evidence for disease progression. The patient was started  on systemic chemotherapy with carboplatin, Alimta and Keytruda status post 5 cycles.   She continues to tolerate this treatment well with no concerning complaints except for the itching and she is currently on Atarax.   I recommended for her to proceed with cycle #6 today as scheduled. For the itching she will continue with Benadryl and hydrocortisone cream topically.  I also advised her to stop Pepcid 20 mg p.o. twice daily. For the persistent back pain and T10 bone lesion with epidural extension, she underwent palliative radiotherapy under the care of Dr. Isidore Moos with improvement of her disease.  Her new pain is likely secondary to the expansile lesion at the left 10th rib.  I will arrange for the patient to have repeat CT scan of the chest, abdomen and pelvis in 3 weeks  for restaging of her disease. For the suspicious nonocclusive blood clot at the tip of the Port-A-Cath, she was started on Coumadin 5 mg p.o. daily.  We will continue to monitor her PT/INR closely. For the hypokalemia, will give the patient potassium supplement. She was advised to call immediately if she has any concerning symptoms in the interval. The patient voices understanding of current disease status and treatment options and is in agreement with the current care plan. All questions were answered. The patient knows to call the clinic with any problems, questions or concerns. We can certainly see the patient much sooner if necessary.  Disclaimer: This note was dictated with voice recognition software. Similar sounding words can inadvertently be transcribed and may not be corrected upon review.

## 2019-04-01 NOTE — Addendum Note (Signed)
Addended by: Ardeen Garland on: 04/01/2019 11:32 AM   Modules accepted: Orders

## 2019-04-02 ENCOUNTER — Telehealth: Payer: Self-pay | Admitting: *Deleted

## 2019-04-02 NOTE — Telephone Encounter (Signed)
On 04-01-19 fax end of tx note to [facility] at [phone] . Ok per Dr Isidore Moos

## 2019-04-04 DIAGNOSIS — C7A8 Other malignant neuroendocrine tumors: Secondary | ICD-10-CM | POA: Diagnosis not present

## 2019-04-04 DIAGNOSIS — G893 Neoplasm related pain (acute) (chronic): Secondary | ICD-10-CM | POA: Diagnosis not present

## 2019-04-04 DIAGNOSIS — F17210 Nicotine dependence, cigarettes, uncomplicated: Secondary | ICD-10-CM | POA: Diagnosis not present

## 2019-04-05 ENCOUNTER — Other Ambulatory Visit: Payer: Self-pay | Admitting: Medical Oncology

## 2019-04-05 ENCOUNTER — Telehealth: Payer: Self-pay | Admitting: Medical Oncology

## 2019-04-05 DIAGNOSIS — K121 Other forms of stomatitis: Secondary | ICD-10-CM

## 2019-04-05 MED ORDER — MAGIC MOUTHWASH: 5.0000 mL | Freq: Four times a day (QID) | ORAL | 1 refills | Status: AC | PRN

## 2019-04-05 NOTE — Telephone Encounter (Signed)
Sore mouth ,thick mucous.  I told her to use 1/4 tsp baking soday and 1/8 tsp salt in 8 oz water. Swish and spit every 1-2 hrs. Per Julien Nordmann I called in [facility].

## 2019-04-09 ENCOUNTER — Other Ambulatory Visit: Payer: Medicare Other

## 2019-04-09 DIAGNOSIS — C7A8 Other malignant neuroendocrine tumors: Secondary | ICD-10-CM | POA: Diagnosis not present

## 2019-04-11 ENCOUNTER — Encounter (HOSPITAL_COMMUNITY): Payer: Self-pay

## 2019-04-11 ENCOUNTER — Other Ambulatory Visit: Payer: Self-pay

## 2019-04-11 ENCOUNTER — Ambulatory Visit (HOSPITAL_COMMUNITY)
Admission: RE | Admit: 2019-04-11 | Discharge: 2019-04-11 | Disposition: A | Discharge status: Home / Self Care | Payer: Medicare Other | Source: Ambulatory Visit | Attending: Internal Medicine | Admitting: Internal Medicine

## 2019-04-11 ENCOUNTER — Encounter: Payer: Self-pay | Admitting: *Deleted

## 2019-04-11 ENCOUNTER — Telehealth: Payer: Self-pay | Admitting: *Deleted

## 2019-04-11 DIAGNOSIS — C3490 Malignant neoplasm of unspecified part of unspecified bronchus or lung: Secondary | ICD-10-CM | POA: Diagnosis not present

## 2019-04-11 DIAGNOSIS — C7951 Secondary malignant neoplasm of bone: Secondary | ICD-10-CM | POA: Diagnosis not present

## 2019-04-11 MED ORDER — SODIUM CHLORIDE (PF) 0.9 % IJ SOLN
INTRAMUSCULAR | Status: AC
  Filled 2019-04-11: qty 50

## 2019-04-11 MED ORDER — IOHEXOL 300 MG/ML  SOLN
100.0000 mL | Freq: Once | INTRAMUSCULAR | Status: AC | PRN
  Administered 2019-04-11: 100 mL via INTRAVENOUS

## 2019-04-11 NOTE — Telephone Encounter (Signed)
"  Alison Sullivan CT [phone]).  Alison Sullivan is here now for CT CA/P with contrast.  Unable to proceed with scans or reach nurse.  Can you help with ' Allergy list'?  She has received IV contrast in the past.  Contrast media/ Iodinated Diagnostic agents is on her allergy list with description note she can receive IV contrast not oral.  Reaction to oral contrast of stomach cramps and diarrhea is a sensitivity intolerance not an allergy.  Cannot proceed with scans with allergy listed."  This nurse deleted Contrast Media/Iodinated Diagnostic agents from allergy list due to entry miscategorized.  Barium Sulfate oral contrast medium as intolerance.

## 2019-04-12 DIAGNOSIS — C7A8 Other malignant neuroendocrine tumors: Secondary | ICD-10-CM | POA: Diagnosis not present

## 2019-04-15 ENCOUNTER — Inpatient Hospital Stay (HOSPITAL_COMMUNITY)
Admission: EM | Admit: 2019-04-15 | Discharge: 2019-04-19 | DRG: 393 | Disposition: A | Discharge status: Home / Self Care | Payer: Medicare Other | Attending: Family Medicine | Admitting: Family Medicine

## 2019-04-15 ENCOUNTER — Other Ambulatory Visit: Payer: Self-pay

## 2019-04-15 ENCOUNTER — Encounter (HOSPITAL_COMMUNITY): Payer: Self-pay | Admitting: Emergency Medicine

## 2019-04-15 ENCOUNTER — Other Ambulatory Visit: Payer: Medicare Other

## 2019-04-15 DIAGNOSIS — Y832 Surgical operation with anastomosis, bypass or graft as the cause of abnormal reaction of the patient, or of later complication, without mention of misadventure at the time of the procedure: Secondary | ICD-10-CM | POA: Diagnosis present

## 2019-04-15 DIAGNOSIS — E785 Hyperlipidemia, unspecified: Secondary | ICD-10-CM | POA: Diagnosis not present

## 2019-04-15 DIAGNOSIS — G4700 Insomnia, unspecified: Secondary | ICD-10-CM | POA: Diagnosis present

## 2019-04-15 DIAGNOSIS — R1310 Dysphagia, unspecified: Secondary | ICD-10-CM | POA: Diagnosis present

## 2019-04-15 DIAGNOSIS — Z79899 Other long term (current) drug therapy: Secondary | ICD-10-CM

## 2019-04-15 DIAGNOSIS — D649 Anemia, unspecified: Secondary | ICD-10-CM

## 2019-04-15 DIAGNOSIS — N189 Chronic kidney disease, unspecified: Secondary | ICD-10-CM | POA: Diagnosis present

## 2019-04-15 DIAGNOSIS — D701 Agranulocytosis secondary to cancer chemotherapy: Secondary | ICD-10-CM

## 2019-04-15 DIAGNOSIS — C7A1 Malignant poorly differentiated neuroendocrine tumors: Secondary | ICD-10-CM | POA: Diagnosis present

## 2019-04-15 DIAGNOSIS — D61810 Antineoplastic chemotherapy induced pancytopenia: Secondary | ICD-10-CM | POA: Diagnosis not present

## 2019-04-15 DIAGNOSIS — D61818 Other pancytopenia: Secondary | ICD-10-CM | POA: Diagnosis not present

## 2019-04-15 DIAGNOSIS — C7951 Secondary malignant neoplasm of bone: Secondary | ICD-10-CM | POA: Diagnosis not present

## 2019-04-15 DIAGNOSIS — Z800 Family history of malignant neoplasm of digestive organs: Secondary | ICD-10-CM

## 2019-04-15 DIAGNOSIS — T82868A Thrombosis of vascular prosthetic devices, implants and grafts, initial encounter: Secondary | ICD-10-CM | POA: Diagnosis not present

## 2019-04-15 DIAGNOSIS — T451X5A Adverse effect of antineoplastic and immunosuppressive drugs, initial encounter: Secondary | ICD-10-CM | POA: Diagnosis not present

## 2019-04-15 DIAGNOSIS — R197 Diarrhea, unspecified: Secondary | ICD-10-CM | POA: Diagnosis not present

## 2019-04-15 DIAGNOSIS — R52 Pain, unspecified: Secondary | ICD-10-CM | POA: Diagnosis not present

## 2019-04-15 DIAGNOSIS — K521 Toxic gastroenteritis and colitis: Secondary | ICD-10-CM | POA: Diagnosis not present

## 2019-04-15 DIAGNOSIS — Z91041 Radiographic dye allergy status: Secondary | ICD-10-CM

## 2019-04-15 DIAGNOSIS — Z20828 Contact with and (suspected) exposure to other viral communicable diseases: Secondary | ICD-10-CM | POA: Diagnosis not present

## 2019-04-15 DIAGNOSIS — Z87442 Personal history of urinary calculi: Secondary | ICD-10-CM

## 2019-04-15 DIAGNOSIS — Y712 Prosthetic and other implants, materials and accessory cardiovascular devices associated with adverse incidents: Secondary | ICD-10-CM | POA: Diagnosis present

## 2019-04-15 DIAGNOSIS — E876 Hypokalemia: Secondary | ICD-10-CM | POA: Diagnosis not present

## 2019-04-15 DIAGNOSIS — R64 Cachexia: Secondary | ICD-10-CM | POA: Diagnosis not present

## 2019-04-15 DIAGNOSIS — Z6822 Body mass index (BMI) 22.0-22.9, adult: Secondary | ICD-10-CM

## 2019-04-15 DIAGNOSIS — Z79890 Hormone replacement therapy (postmenopausal): Secondary | ICD-10-CM

## 2019-04-15 DIAGNOSIS — M8580 Other specified disorders of bone density and structure, unspecified site: Secondary | ICD-10-CM | POA: Diagnosis present

## 2019-04-15 DIAGNOSIS — C3492 Malignant neoplasm of unspecified part of left bronchus or lung: Secondary | ICD-10-CM

## 2019-04-15 DIAGNOSIS — Z7901 Long term (current) use of anticoagulants: Secondary | ICD-10-CM

## 2019-04-15 DIAGNOSIS — R531 Weakness: Secondary | ICD-10-CM | POA: Diagnosis not present

## 2019-04-15 DIAGNOSIS — Z923 Personal history of irradiation: Secondary | ICD-10-CM

## 2019-04-15 DIAGNOSIS — Z87440 Personal history of urinary (tract) infections: Secondary | ICD-10-CM

## 2019-04-15 DIAGNOSIS — Z808 Family history of malignant neoplasm of other organs or systems: Secondary | ICD-10-CM

## 2019-04-15 DIAGNOSIS — D709 Neutropenia, unspecified: Secondary | ICD-10-CM | POA: Diagnosis present

## 2019-04-15 LAB — COMPREHENSIVE METABOLIC PANEL
ALT: 52 U/L — ABNORMAL HIGH (ref 0–44)
AST: 78 U/L — ABNORMAL HIGH (ref 15–41)
Albumin: 2.9 g/dL — ABNORMAL LOW (ref 3.5–5.0)
Alkaline Phosphatase: 112 U/L (ref 38–126)
Anion gap: 10 (ref 5–15)
BUN: 17 mg/dL (ref 8–23)
CO2: 23 mmol/L (ref 22–32)
Calcium: 8.3 mg/dL — ABNORMAL LOW (ref 8.9–10.3)
Chloride: 104 mmol/L (ref 98–111)
Creatinine, Ser: 0.84 mg/dL (ref 0.44–1.00)
GFR calc Af Amer: >60 mL/min (ref >60)
GFR calc non Af Amer: >60 mL/min (ref >60)
Glucose, Bld: 115 mg/dL — ABNORMAL HIGH (ref 70–99)
Potassium: 2.6 mmol/L — CL (ref 3.5–5.1)
Sodium: 137 mmol/L (ref 135–145)
Total Bilirubin: 0.8 mg/dL (ref 0.3–1.2)
Total Protein: 6.7 g/dL (ref 6.5–8.1)

## 2019-04-15 LAB — MAGNESIUM: Magnesium: 1.7 mg/dL (ref 1.7–2.4)

## 2019-04-15 LAB — SARS CORONAVIRUS 2 BY RT PCR (HOSPITAL ORDER, PERFORMED IN ~~LOC~~ HOSPITAL LAB): SARS Coronavirus 2: NEGATIVE

## 2019-04-15 LAB — ABO/RH: ABO/RH(D): O POS

## 2019-04-15 LAB — PREPARE RBC (CROSSMATCH)

## 2019-04-15 LAB — POC OCCULT BLOOD, ED: Fecal Occult Bld: NEGATIVE

## 2019-04-15 MED ORDER — LOPERAMIDE HCL 2 MG PO CAPS
2.0000 mg | ORAL_CAPSULE | ORAL | Status: DC | PRN
  Administered 2019-04-16 (×2): 2 mg via ORAL
  Filled 2019-04-15 (×2): qty 1

## 2019-04-15 MED ORDER — SODIUM CHLORIDE 0.9 % IV BOLUS
1000.0000 mL | Freq: Once | INTRAVENOUS | Status: AC
  Administered 2019-04-15: 1000 mL via INTRAVENOUS

## 2019-04-15 MED ORDER — ONDANSETRON HCL 4 MG/2ML IJ SOLN
4.0000 mg | Freq: Four times a day (QID) | INTRAMUSCULAR | Status: DC | PRN
  Administered 2019-04-15 – 2019-04-18 (×2): 4 mg via INTRAVENOUS
  Filled 2019-04-15 (×2): qty 2

## 2019-04-15 MED ORDER — OXYCODONE HCL 5 MG PO TABS
5.0000 mg | ORAL_TABLET | ORAL | Status: DC | PRN
  Administered 2019-04-16 – 2019-04-18 (×4): 10 mg via ORAL
  Administered 2019-04-18: 5 mg via ORAL
  Administered 2019-04-18 – 2019-04-19 (×2): 10 mg via ORAL
  Filled 2019-04-15 (×4): qty 2
  Filled 2019-04-15: qty 1
  Filled 2019-04-15 (×3): qty 2

## 2019-04-15 MED ORDER — SODIUM CHLORIDE 0.9 % IV SOLN: 10.0000 mL/h | Freq: Once | INTRAVENOUS | Status: DC

## 2019-04-15 MED ORDER — MELATONIN 5 MG PO TABS
10.0000 mg | ORAL_TABLET | Freq: Every day | ORAL | Status: DC
  Administered 2019-04-15: 10 mg via ORAL
  Filled 2019-04-15 (×5): qty 2

## 2019-04-15 MED ORDER — SODIUM CHLORIDE 0.9 % IV SOLN: Freq: Once | INTRAVENOUS | Status: DC

## 2019-04-15 MED ORDER — LORAZEPAM 2 MG/ML IJ SOLN
1.0000 mg | Freq: Once | INTRAMUSCULAR | Status: AC
  Administered 2019-04-15: 1 mg via INTRAVENOUS
  Filled 2019-04-15: qty 1

## 2019-04-15 MED ORDER — POTASSIUM CHLORIDE 10 MEQ/100ML IV SOLN
10.0000 meq | INTRAVENOUS | Status: AC
  Administered 2019-04-15 (×3): 10 meq via INTRAVENOUS
  Filled 2019-04-15 (×3): qty 100

## 2019-04-15 MED ORDER — FENTANYL CITRATE (PF) 100 MCG/2ML IJ SOLN
100.0000 ug | Freq: Once | INTRAMUSCULAR | Status: AC
  Administered 2019-04-15: 100 ug via INTRAVENOUS
  Filled 2019-04-15: qty 2

## 2019-04-15 MED ORDER — SODIUM CHLORIDE 0.9 % IV SOLN
INTRAVENOUS | Status: DC
  Administered 2019-04-15 – 2019-04-18 (×3): via INTRAVENOUS

## 2019-04-15 MED ORDER — ONDANSETRON HCL 4 MG/2ML IJ SOLN
4.0000 mg | Freq: Once | INTRAMUSCULAR | Status: AC
  Administered 2019-04-15: 4 mg via INTRAVENOUS
  Filled 2019-04-15: qty 2

## 2019-04-15 MED ORDER — SODIUM CHLORIDE 0.9% IV SOLUTION: Freq: Once | INTRAVENOUS | Status: DC

## 2019-04-15 MED ORDER — GABAPENTIN 300 MG PO CAPS: 300.0000 mg | ORAL_CAPSULE | Freq: Every day | ORAL | Status: DC | PRN

## 2019-04-15 MED ORDER — FOLIC ACID 1 MG PO TABS
1.0000 mg | ORAL_TABLET | Freq: Every evening | ORAL | Status: DC
  Administered 2019-04-15 – 2019-04-18 (×2): 1 mg via ORAL
  Filled 2019-04-15 (×2): qty 1

## 2019-04-15 MED ORDER — POLYETHYLENE GLYCOL 3350 17 G PO PACK: 17.0000 g | PACK | Freq: Every day | ORAL | Status: DC | PRN

## 2019-04-15 MED ORDER — ONDANSETRON HCL 4 MG PO TABS: 4.0000 mg | ORAL_TABLET | Freq: Four times a day (QID) | ORAL | Status: DC | PRN

## 2019-04-15 MED ORDER — SODIUM CHLORIDE 0.9 % IV SOLN
Freq: Once | INTRAVENOUS | Status: AC
  Administered 2019-04-15: 11:00:00 via INTRAVENOUS

## 2019-04-15 MED ORDER — MAGIC MOUTHWASH
5.0000 mL | Freq: Four times a day (QID) | ORAL | Status: DC | PRN
  Filled 2019-04-15: qty 5

## 2019-04-15 MED ORDER — ACETAMINOPHEN 650 MG RE SUPP: 650.0000 mg | Freq: Four times a day (QID) | RECTAL | Status: DC | PRN

## 2019-04-15 MED ORDER — POTASSIUM CHLORIDE CRYS ER 20 MEQ PO TBCR
40.0000 meq | EXTENDED_RELEASE_TABLET | Freq: Once | ORAL | Status: AC
  Administered 2019-04-15: 40 meq via ORAL
  Filled 2019-04-15: qty 2

## 2019-04-15 MED ORDER — ACETAMINOPHEN 325 MG PO TABS: 650.0000 mg | ORAL_TABLET | Freq: Four times a day (QID) | ORAL | Status: DC | PRN

## 2019-04-15 NOTE — ED Triage Notes (Signed)
Pt BIBA from home c/o diarrhea, LLQ Abdominal pain for "a couple days." Hx of stage 4 lung cancer, currently undergoing radiation and chemotherapy.    AOx4.

## 2019-04-15 NOTE — ED Notes (Signed)
ED TO INPATIENT HANDOFF REPORT  ED Nurse Name and Phone #: [phone] Cari, RN  S Name/Age/Gender [name] 68 y.o. female Room/Bed: WA19/WA19  Code Status   Code Status: Prior  Home/SNF/Other Home Patient oriented to: self, place, time and situation Is this baseline? Yes   Triage Complete: Triage complete  Chief Complaint diarrhea; Ca pt  Triage Note Pt BIBA from home c/o diarrhea, LLQ Abdominal pain for "a couple days." Hx of stage 4 lung cancer, currently undergoing radiation and chemotherapy.    AOx4.    Allergies Allergies  Allergen Reactions  . Barium Sulfate Diarrhea    Hypersensitivity intolerance reaction of stomach cramps and diarrhea.    Level of Care/Admitting Diagnosis ED Disposition    ED Disposition Condition Comment   Admit  Hospital Area: [facility] [[id number]]  Level of Care: Med-Surg [16]  Covid Evaluation: Screening Protocol (No Symptoms)  Diagnosis: Pancytopenia [facility]) [[id number]]  Admitting Physician: Mariel Aloe [phone]  Attending Physician: Mariel Aloe [phone]  PT Class (Do Not Modify): Observation [104]  PT Acc Code (Do Not Modify): Observation [10022]       B Medical/Surgery History Past Medical History:  Diagnosis Date  . Arthritis   . Chronic kidney disease   . Dog bite of wrist   . History of kidney stones   . History of radiation therapy 10/23/18- 11/06/18   Left lower rib mass/ 30 Gy delivered in 10 fractions 3 Gy.   . Kidney infection   . Left renal mass   . Osteopenia   . Pyelonephritis   . Recurrent UTI (urinary tract infection)    Past Surgical History:  Procedure Laterality Date  . CYSTOSCOPY WITH RETROGRADE PYELOGRAM, URETEROSCOPY AND STENT PLACEMENT Left 12/28/2017   Procedure: CYSTOSCOPY WITH RETROGRADE PYELOGRAM, URETEROSCOPY AND STENT PLACEMENT, STONE BASKETRY;  Surgeon: Festus Aloe, MD;  Location: [facility];  Service: Urology;  Laterality: Left;  . FINGER  SURGERY     index, growth removal  . HOLMIUM LASER APPLICATION Left 04/29/736   Procedure: HOLMIUM LASER APPLICATION;  Surgeon: Festus Aloe, MD;  Location: [facility];  Service: Urology;  Laterality: Left;  . IR IMAGING GUIDED PORT INSERTION  10/31/2018  . KIDNEY STONE SURGERY  2011 or 2012  . LITHOTRIPSY  2007     A IV Location/Drains/Wounds Patient Lines/Drains/Airways Status   Active Line/Drains/Airways    Name:   Placement date:   Placement time:   Site:   Days:   Implanted Port 10/31/18 Right Chest   10/31/18    1027    Chest   166   Peripheral IV 04/15/19 Right Antecubital   04/15/19    1405    Antecubital   less than 1   Ureteral Drain/Stent Left ureter 6 Fr.   12/28/17    0908    Left ureter   473   External Urinary Catheter   02/23/19    1142    -   51   Incision (Closed) 12/28/17 Perineum   12/28/17    0718     473          Intake/Output Last 24 hours  Intake/Output Summary (Last 24 hours) at 04/15/2019 1630 Last data filed at 04/15/2019 1516 Gross per 24 hour  Intake 1615 ml  Output -  Net 1615 ml    Labs/Imaging Results for orders placed or performed during the hospital encounter of 04/15/19 (from the past 48 hour(s))  Comprehensive metabolic  panel     Status: Abnormal   Collection Time: 04/15/19  9:45 AM  Result Value Ref Range   Sodium 137 135 - 145 mmol/L   Potassium 2.6 (LL) 3.5 - 5.1 mmol/L    Comment: CRITICAL RESULT CALLED TO, READ BACK BY AND VERIFIED WITH: S.BINGHAM RN AT 1034 ON 04/15/2019 BY S.VANHOORNE    Chloride 104 98 - 111 mmol/L   CO2 23 22 - 32 mmol/L   Glucose, Bld 115 (H) 70 - 99 mg/dL   BUN 17 8 - 23 mg/dL   Creatinine, Ser 0.84 0.44 - 1.00 mg/dL   Calcium 8.3 (L) 8.9 - 10.3 mg/dL   Total Protein 6.7 6.5 - 8.1 g/dL   Albumin 2.9 (L) 3.5 - 5.0 g/dL   AST 78 (H) 15 - 41 U/L   ALT 52 (H) 0 - 44 U/L   Alkaline Phosphatase 112 38 - 126 U/L   Total Bilirubin 0.8 0.3 - 1.2 mg/dL   GFR calc non Af Amer >60 >60 mL/min    GFR calc Af Amer >60 >60 mL/min   Anion gap 10 5 - 15    Comment: Performed at [facility], [place] [address] [place], [place] [address]  CBC with Differential     Status: Abnormal   Collection Time: 04/15/19  9:45 AM  Result Value Ref Range   WBC 2.0 (L) 4.0 - 10.5 K/uL   RBC 1.80 (L) 3.87 - 5.11 MIL/uL   Hemoglobin 6.4 (LL) 12.0 - 15.0 g/dL    Comment: This critical result has verified and been called to [place] by Raelyn Ensign on 06 01 2020 at 1016, and has been read back. Critical Result Verified   HCT 18.9 (L) 36.0 - 46.0 %   MCV 105.0 (H) 80.0 - 100.0 fL   MCH 35.6 (H) 26.0 - 34.0 pg   MCHC 33.9 30.0 - 36.0 g/dL   RDW 14.9 11.5 - 15.5 %   Platelets 17 (LL) 150 - 400 K/uL    Comment: REPEATED TO VERIFY PLATELET COUNT CONFIRMED BY SMEAR SPECIMEN CHECKED FOR CLOTS Immature Platelet Fraction may be clinically indicated, consider ordering this additional test [id number]    nRBC 0.0 0.0 - 0.2 %   Neutrophils Relative % 44 %   Neutro Abs 0.9 (L) 1.7 - 7.7 K/uL   Lymphocytes Relative 28 %   Lymphs Abs 0.6 (L) 0.7 - 4.0 K/uL   Monocytes Relative 24 %   Monocytes Absolute 0.5 0.1 - 1.0 K/uL   Eosinophils Relative 2 %   Eosinophils Absolute 0.0 0.0 - 0.5 K/uL   Basophils Relative 0 %   Basophils Absolute 0.0 0.0 - 0.1 K/uL   Immature Granulocytes 2 %   Abs Immature Granulocytes 0.03 0.00 - 0.07 K/uL   Polychromasia PRESENT     Comment: Performed at [facility], [place] [address], [place], [place] [address]  Magnesium     Status: None   Collection Time: 04/15/19  9:45 AM  Result Value Ref Range   Magnesium 1.7 1.7 - 2.4 mg/dL    Comment: Performed at [facility], [place] [address], [place], [place] [address]  Type and screen [place]     Status: None (Preliminary result)   Collection Time: 04/15/19 10:52 AM  Result Value Ref Range   ABO/RH(D) O POS    Antibody Screen NEG    Sample Expiration  04/18/2019,2359    Unit Number [id number]  Blood Component Type RED CELLS,LR    Unit division 00    Status of Unit ISSUED    Transfusion Status OK TO TRANSFUSE    Crossmatch Result      Compatible Performed at [place] [address] [place], [place] [address]   ABO/Rh     Status: None (Preliminary result)   Collection Time: 04/15/19 10:52 AM  Result Value Ref Range   ABO/RH(D)      O POS Performed at [facility], [place] [address] [place], [place] [address]   POC occult blood, ED     Status: None   Collection Time: 04/15/19 12:50 PM  Result Value Ref Range   Fecal Occult Bld NEGATIVE NEGATIVE  Prepare RBC     Status: None   Collection Time: 04/15/19  1:18 PM  Result Value Ref Range   Order Confirmation      ORDER PROCESSED BY BLOOD BANK Performed at [place] [address], [place],  [address]    No results found.  Pending Labs Unresulted Labs (From admission, onward)    Start     Ordered   04/15/19 1502  SARS Coronavirus 2 (CEPHEID - Performed in [place] hospital lab), [place]  (Asymptomatic Patients Labs)  Once,   R    Question:  Rule Out  Answer:  Yes   04/15/19 1501   04/15/19 1450  Prepare Pheresed Platelets  (Adult Blood Administration - Platelets (Pheresed))  Once,   R    Question Answer Comment  Number of Apheresis Units 1 unit (6-10 packs)   Transfusion Indications Transfuse      04/15/19 1450   Signed and Held  Comprehensive metabolic panel  Tomorrow morning,   R     Signed and Held   Signed and Held  CBC  Tomorrow morning,   R     Signed and Held   Signed and Held  Protime-INR  Tomorrow morning,   R     Signed and Held          Vitals/Pain Today's Vitals   04/15/19 1410 04/15/19 1425 04/15/19 1430 04/15/19 1530  BP: 106/71 109/72 113/68 110/62  Pulse:  86 85 88  Resp: 18 16 18 17   Temp: 98.2 F (36.8 C) 98.6 F (37 C)    TempSrc: Oral Oral    SpO2: 99% 99%  97% 98%  Weight:      Height:      PainSc:        Isolation Precautions No active isolations  Medications Medications  0.9 %  sodium chloride infusion (has no administration in time range)  0.9 %  sodium chloride infusion (Manually program via Guardrails IV Fluids) (has no administration in time range)  loperamide (IMODIUM) capsule 2 mg (has no administration in time range)  0.9 %  sodium chloride infusion (has no administration in time range)  sodium chloride 0.9 % bolus 1,000 mL (0 mLs Intravenous Stopped 04/15/19 1056)  fentaNYL (SUBLIMAZE) injection 100 mcg (100 mcg Intravenous Given 04/15/19 1051)  LORazepam (ATIVAN) injection 1 mg (1 mg Intravenous Given 04/15/19 1052)  potassium chloride 10 mEq in 100 mL IVPB (0 mEq Intravenous Stopped 04/15/19 1516)  potassium chloride SA (K-DUR) CR tablet 40 mEq (40 mEq Oral Given 04/15/19 1104)  ondansetron (ZOFRAN) injection 4 mg (4 mg Intravenous Given 04/15/19 1108)  0.9 %  sodium chloride infusion ( Intravenous New Bag/Given 04/15/19 1112)    Mobility walks with  person assist Low fall risk   Focused Assessments Neuro Assessment Handoff:  Swallow screen pass? Yes          Neuro Assessment:   Neuro Checks:      Last Documented NIHSS Modified Score:   Has TPA been given? No If patient is a Neuro Trauma and patient is going to OR before floor call report to [place] nurse: [phone] or [phone]     R Recommendations: See Admitting Provider Note  Report given to:   Additional Notes:

## 2019-04-15 NOTE — ED Notes (Signed)
Pt provided with ginger ale per her request.

## 2019-04-15 NOTE — ED Notes (Signed)
Pts port was accessed using sterile technique.  Port was easily flushed, provided good blood return.

## 2019-04-15 NOTE — ED Provider Notes (Signed)
[facility] DEPT Provider Note   CSN: [id number] Arrival date & time: 04/15/19  8413    History   Chief Complaint Chief Complaint  Patient presents with  . Diarrhea    HPI Alison Sullivan is a 68 y.o. female.     HPI   Presents for evaluation of abdominal pain.  She is currently receiving treatment at [facility] for metastatic neuroendocrine carcinoma.  She has been treated both with radiation, for rib lesions, and chemotherapy.  She states that for 3 days she has had diarrhea, Fan and brown in color.  There is been no blood in the stool.  She has difficulty eating because she "gags."  He is not had any vomiting.  Currently, she is thirsty and wants to try drinking.  She denies fever, chills, cough, shortness of breath, chest pain, focal weakness or paresthesia.  She feels, generally weak.  Last chemotherapy, 2 weeks ago.  Last radiation therapy, more than 2 months ago, to her spine.  There are no other known modifying factors.  Past Medical History:  Diagnosis Date  . Arthritis   . Chronic kidney disease   . Dog bite of wrist   . History of kidney stones   . History of radiation therapy 10/23/18- 11/06/18   Left lower rib mass/ 30 Gy delivered in 10 fractions 3 Gy.   . Kidney infection   . Left renal mass   . Osteopenia   . Pyelonephritis   . Recurrent UTI (urinary tract infection)     Patient Active Problem List   Diagnosis Date Noted  . Neuroendocrine carcinoma of the left lung  02/24/2019  . Neutropenia ([place]) 02/24/2019  . Diverticulitis of sigmoid colon 02/24/2019  . Hypokalemia 02/24/2019  . Thrombus port  A cath 02/24/2019  . Abscess of sigmoid colon due to diverticulitis 02/23/2019  . Long term current use of anticoagulant therapy 01/10/2019  . Port-A-Cath in place 12/20/2018  . Large cell carcinoma of left lung, stage 4 ([place]) 10/18/2018  . Encounter for antineoplastic immunotherapy 10/18/2018  .  Bone metastases ([place]) 10/10/2018  . Bronchiectasis without complication ([place]) 24/40/1027  . COPD GOLD ? II  Still smoking 09/12/2018  . Cigarette smoker 09/12/2018  . Pulmonary nodule 09/11/2018  . Left-sided back pain 08/06/2018  . Rib pain on left side 08/06/2018  . Acute thoracic back pain 07/05/2018  . UTI symptoms 03/17/2018  . Closed nondisplaced fracture of lateral malleolus of left fibula 03/06/2018  . Pain in left ankle and joints of left foot 02/13/2018  . Right low back pain 02/09/2018  . Fall 02/09/2018  . Right ankle pain 02/09/2018  . Left ankle pain 02/09/2018  . Hyperlipidemia 01/10/2018  . Routine general medical examination at a health care facility 01/02/2015  . Insomnia 01/02/2015  . Nephrolithiasis 08/11/2013    Past Surgical History:  Procedure Laterality Date  . CYSTOSCOPY WITH RETROGRADE PYELOGRAM, URETEROSCOPY AND STENT PLACEMENT Left 12/28/2017   Procedure: CYSTOSCOPY WITH RETROGRADE PYELOGRAM, URETEROSCOPY AND STENT PLACEMENT, STONE BASKETRY;  Surgeon: Festus Aloe, MD;  Location: [facility];  Service: Urology;  Laterality: Left;  . FINGER SURGERY     index, growth removal  . HOLMIUM LASER APPLICATION Left 2/53/6644   Procedure: HOLMIUM LASER APPLICATION;  Surgeon: Festus Aloe, MD;  Location: [facility];  Service: Urology;  Laterality: Left;  . IR IMAGING GUIDED PORT INSERTION  10/31/2018  . KIDNEY STONE SURGERY  2011 or 2012  .  LITHOTRIPSY  2007     OB History   No obstetric history on file.      Home Medications    Prior to Admission medications   Medication Sig Start Date End Date Taking? Authorizing Provider  folic acid (FOLVITE) 1 MG tablet Take 1 tablet (1 mg total) by mouth daily. Patient taking differently: Take 1 mg by mouth every evening.  12/10/18   Curt Bears, MD  gabapentin (NEURONTIN) 600 MG tablet Take 0.5 tablets (300 mg total) by mouth 3 (three) times daily. 03/27/19   Tanner, Lyndon Code., PA-C  ibuprofen (ADVIL) 800 MG tablet  10/24/18   [provider]  lidocaine-prilocaine (EMLA) cream Apply 1 application topically as needed. Patient taking differently: Apply 1 application topically as needed (For port-a-cath.).  10/18/18   Curt Bears, MD  magic mouthwash SOLN Take 5 mLs by mouth 4 (four) times daily as needed for mouth pain. Components - Hydrocortisone 60 mg, Nystatin suspension 30 ml , Benadryl 12.5mg  /5 ml QS 240 ml. 04/05/19   Curt Bears, MD  Melatonin 10 MG TABS Take 10 mg by mouth at bedtime.     [provider]  omeprazole (PRILOSEC) 40 MG capsule Take 1 capsule (40 mg total) by mouth daily. Patient taking differently: Take 40 mg by mouth every evening.  02/13/19   Tanner, Lyndon Code., PA-C  oxyCODONE (OXY IR/ROXICODONE) 5 MG immediate release tablet Take 1-2 tablets (5-10 mg total) by mouth every 4 (four) hours as needed for severe pain. 02/04/19   [name], MD  Potassium Chloride 40 MEQ/15ML (20%) SOLN Take 40 mEq by mouth daily for 4 days. 04/01/19 04/05/19  Curt Bears, MD  prochlorperazine (COMPAZINE) 10 MG tablet Take 1 tablet (10 mg total) by mouth every 6 (six) hours as needed for nausea or vomiting. Patient not taking: Reported on 04/01/2019 10/18/18   Curt Bears, MD  simvastatin (ZOCOR) 20 MG tablet Take 1 tablet (20 mg total) by mouth daily at 6 PM. Need annual appointment for further refills Patient taking differently: Take 20 mg by mouth every evening.  02/12/19   [name], MD  warfarin (COUMADIN) 5 MG tablet TAKE 1 TABLET(5 MG) BY MOUTH DAILY Patient taking differently: Take 2.5-5 mg by mouth every evening. Take 2.5mg  on Monday and Thursday and 5mg  the rest of the week. 12/21/18   Curt Bears, MD    Family History Family History  Problem Relation Age of Onset  . Cancer Father        pancreatic  . Melanoma Father     Social History Social History   Tobacco Use  . Smoking status: Former Smoker     Packs/day: 0.25    Years: 30.00    Pack years: 7.50    Types: Cigarettes  . Smokeless tobacco: Never Used  . Tobacco comment: She quit early December 2019  Substance Use Topics  . Alcohol use: Yes    Comment: occ  . Drug use: No     Allergies   Barium sulfate   Review of Systems Review of Systems  All other systems reviewed and are negative.    Physical Exam Updated Vital Signs BP 109/72   Pulse 86   Temp 98.6 F (37 C) (Oral)   Resp 16   Ht 5\' 6"  (1.676 m)   Wt 62.6 kg   SpO2 99%   BMI 22.27 kg/m   Physical Exam Vitals signs and nursing note reviewed.  Constitutional:  General: She is not in acute distress.    Appearance: Normal appearance. She is well-developed. She is ill-appearing. She is not toxic-appearing or diaphoretic.  HENT:     Head: Normocephalic and atraumatic.     Right Ear: External ear normal.     Left Ear: External ear normal.     Nose: No congestion or rhinorrhea.     Mouth/Throat:     Mouth: Mucous membranes are moist.     Pharynx: Oropharynx is clear. No oropharyngeal exudate or posterior oropharyngeal erythema.  Eyes:     Conjunctiva/sclera: Conjunctivae normal.     Pupils: Pupils are equal, round, and reactive to light.  Neck:     Musculoskeletal: Normal range of motion and neck supple.     Trachea: Phonation normal.  Cardiovascular:     Rate and Rhythm: Normal rate and regular rhythm.     Heart sounds: Normal heart sounds.  Pulmonary:     Effort: Pulmonary effort is normal.     Breath sounds: Normal breath sounds.  Abdominal:     General: There is no distension.     Palpations: Abdomen is soft. There is no mass.     Tenderness: There is no abdominal tenderness. There is no guarding.     Hernia: No hernia is present.  Genitourinary:    Comments: Normal anus.  Black stool in rectal vault.  No rectal mass or visible rectal bleeding.  No fecal impaction. Musculoskeletal: Normal range of motion.  Skin:    General: Skin is  warm and dry.  Neurological:     Mental Status: She is alert and oriented to person, place, and time.     Cranial Nerves: No cranial nerve deficit.     Sensory: No sensory deficit.     Motor: No abnormal muscle tone.     Coordination: Coordination normal.  Psychiatric:        Mood and Affect: Mood normal.        Behavior: Behavior normal.        Thought Content: Thought content normal.        Judgment: Judgment normal.      ED Treatments / Results  Labs (all labs ordered are listed, but only abnormal results are displayed) Labs Reviewed  COMPREHENSIVE METABOLIC PANEL - Abnormal; Notable for the following components:      Result Value   Potassium 2.6 (*)    Glucose, Bld 115 (*)    Calcium 8.3 (*)    Albumin 2.9 (*)    AST 78 (*)    ALT 52 (*)    All other components within normal limits  CBC WITH DIFFERENTIAL/PLATELET - Abnormal; Notable for the following components:   WBC 2.0 (*)    RBC 1.80 (*)    Hemoglobin 6.4 (*)    HCT 18.9 (*)    MCV 105.0 (*)    MCH 35.6 (*)    Platelets 17 (*)    Neutro Abs 0.9 (*)    Lymphs Abs 0.6 (*)    All other components within normal limits  SARS CORONAVIRUS 2 (HOSPITAL ORDER, [facility])  POC OCCULT BLOOD, ED  PREPARE RBC (CROSSMATCH)  TYPE AND SCREEN  ABO/RH  PREPARE PLATELET PHERESIS    EKG None  Radiology No results found.  Procedures .Critical Care Performed by: Daleen Bo, MD Authorized by: Daleen Bo, MD   Critical care provider statement:    Critical care time (minutes):  45   Critical care  start time:  04/15/2019 8:40 AM   Critical care end time:  04/15/2019 12:53 PM   Critical care time was exclusive of:  Separately billable procedures and treating other patients   Critical care was necessary to treat or prevent imminent or life-threatening deterioration of the following conditions:  Metabolic crisis and circulatory failure   Critical care was time spent personally by me on the  following activities:  Blood draw for specimens, development of treatment plan with patient or surrogate, discussions with consultants, evaluation of patient's response to treatment, examination of patient, obtaining history from patient or surrogate, ordering and performing treatments and interventions, ordering and review of laboratory studies, pulse oximetry, re-evaluation of patient's condition, review of old charts and ordering and review of radiographic studies   (including critical care time)  Medications Ordered in ED Medications  0.9 %  sodium chloride infusion (has no administration in time range)  0.9 %  sodium chloride infusion (Manually program via Guardrails IV Fluids) (has no administration in time range)  sodium chloride 0.9 % bolus 1,000 mL (0 mLs Intravenous Stopped 04/15/19 1056)  fentaNYL (SUBLIMAZE) injection 100 mcg (100 mcg Intravenous Given 04/15/19 1051)  LORazepam (ATIVAN) injection 1 mg (1 mg Intravenous Given 04/15/19 1052)  potassium chloride 10 mEq in 100 mL IVPB (10 mEq Intravenous New Bag/Given 04/15/19 1339)  potassium chloride SA (K-DUR) CR tablet 40 mEq (40 mEq Oral Given 04/15/19 1104)  ondansetron (ZOFRAN) injection 4 mg (4 mg Intravenous Given 04/15/19 1108)  0.9 %  sodium chloride infusion ( Intravenous New Bag/Given 04/15/19 1112)     Initial Impression / Assessment and Plan / ED Course  I have reviewed the triage vital signs and the nursing notes.  Pertinent labs & imaging results that were available during my care of the patient were reviewed by me and considered in my medical decision making (see chart for details).  Clinical Course as of Apr 14 1501  Mon Apr 15, 2019  1250 Normal except hemoglobin low, white count low, red cell low, hematocrit low, platelets low  CBC with Differential(!!) [EW]  1251 Normal except potassium low, glucose high, calcium normal, albumin low, AST high, ALT high  Comprehensive metabolic panel(!!) [EW]    Clinical Course User Index  [EW] Daleen Bo, MD        Patient Vitals for the past 24 hrs:  BP Temp Temp src Pulse Resp SpO2 Height Weight  04/15/19 1425 109/72 98.6 F (37 C) Oral 86 16 99 % - -  04/15/19 1410 106/71 98.2 F (36.8 C) Oral - 18 99 % - -  04/15/19 1409 106/71 - - 83 19 97 % - -  04/15/19 1400 115/74 - - 90 (!) 21 98 % - -  04/15/19 1330 98/62 - - 84 18 98 % - -  04/15/19 1315 - - - 86 15 100 % - -  04/15/19 1300 (!) 95/58 - - 91 19 97 % - -  04/15/19 1230 93/67 - - 91 19 97 % - -  04/15/19 1212 90/63 - - 92 (!) 22 94 % - -  04/15/19 1200 (!) 89/60 - - 91 13 94 % - -  04/15/19 1145 - - - 92 (!) 22 97 % - -  04/15/19 1130 98/60 - - 92 18 97 % - -  04/15/19 1100 99/66 - - 88 18 93 % - -  04/15/19 1045 - - - 84 - 100 % - -  04/15/19 1030 126/61 - - Marland Kitchen)  101 - 100 % - -  04/15/19 1000 108/70 - - 79 18 98 % - -  04/15/19 0945 110/74 - - 84 20 99 % - -  04/15/19 0900 (!) 108/59 - - 85 20 99 % - -  04/15/19 0840 - - - - - - 5\' 6"  (1.676 m) 62.6 kg  04/15/19 0839 (!) 93/55 98.3 F (36.8 C) Oral 90 - 98 % - -  04/15/19 0833 - - - - - 98 % - -    12:50 PM Reevaluation with update and discussion. After initial assessment and treatment, an updated evaluation reveals she is more comfortable now, previously was treated for left lower rib pain, with continue.  She was also very anxious so received Ativan.  Patient agrees to hospitalization for further treatment.Daleen Bo   Medical Decision Making: Weakness and malaise with diarrhea.  Patient with known neuroendocrine tumor, metastatic.  Left rib pain is likely related to metastases.  With pancytopenia, likely related to recent chemotherapy, and incidental hypokalemia.  Patient relates prior difficulty with low potassium.  Likely worsening potassium level related to diarrhea.  Patient requires hospitalization for further treatment.  Transfusion ordered for symptomatic anemia, IV potassium and oral potassium given.  CRITICAL CARE-yes Performed by:  Daleen Bo  Nursing Notes Reviewed/ Care Coordinated Applicable Imaging Reviewed Interpretation of Laboratory Data incorporated into ED treatment  3:02 PM-Consult complete with hospitalist. Patient case explained and discussed. He agrees to admit patient for further evaluation and treatment. Call ended at 3:03 PM  Plan: Admit   Final Clinical Impressions(s) / ED Diagnoses   Final diagnoses:  Pancytopenia ([place])  Anemia, unspecified type  Hypokalemia    ED Discharge Orders    None       Daleen Bo, MD 04/15/19 1503

## 2019-04-15 NOTE — ED Notes (Signed)
Pt tolerating PO fluid intake at this time.  Will continue to monitor.

## 2019-04-15 NOTE — ED Notes (Signed)
CRITICAL VALUE STICKER  CRITICAL VALUE: HgB 6.4; Platelets 17  RECEIVER (on-site recipient of call): Alison Sullivan  DATE & TIME NOTIFIED: 04/15/2019 1018  MESSENGER (representative from lab):  MD NOTIFIED: Eulis Foster  TIME OF NOTIFICATION: 1020  RESPONSE: awaiting orders

## 2019-04-15 NOTE — H&P (Signed)
History and Physical    Alison Sullivan KNL:[id number] DOB: 1950-12-08 DOA: 04/15/2019  PCP: [name], MD Patient coming from: Home  Chief Complaint: Progressive weakness  HPI: Alison Sullivan is a 68 y.o. female with medical history significant of metastatic high-grade neuroendocrine carcinoma, hyperlipidemia, nonocclusive thrombus under the Port-A-Cath.  Patient presented secondary to a 3 to 4-day history of worsening weakness, diarrhea, dysphasia.  Patient reports that she is having trouble keeping food down and having multiple stools.  She has not taken anything for her diarrhea. She last had chemotherapy about 2 weeks ago.  She is able to keep up with her fluid losses secondary to trouble swallowing.  ED Course: Vitals: Afebrile, normal pulse, respirations, normotensive, SpO2 of 90% on room air. Labs: Potassium 2.6, AST of 78, ALT of 52, WBC of 2.0k, neutrophils of 900, hemoglobin of 6.4, platelets of 17 Imaging: None Medications/Course: 1 unit PRBC, fentanyl, Ativan, Zofran  Review of Systems: Review of Systems  Constitutional: Negative for chills, fever and malaise/fatigue.  Respiratory: Positive for shortness of breath.   Cardiovascular: Negative for chest pain.  Gastrointestinal: Positive for diarrhea and vomiting. Negative for nausea.  All other systems reviewed and are negative.   Past Medical History:  Diagnosis Date  . Arthritis   . Chronic kidney disease   . Dog bite of wrist   . History of kidney stones   . History of radiation therapy 10/23/18- 11/06/18   Left lower rib mass/ 30 Gy delivered in 10 fractions 3 Gy.   . Kidney infection   . Left renal mass   . Osteopenia   . Pyelonephritis   . Recurrent UTI (urinary tract infection)     Past Surgical History:  Procedure Laterality Date  . CYSTOSCOPY WITH RETROGRADE PYELOGRAM, URETEROSCOPY AND STENT PLACEMENT Left 12/28/2017   Procedure: CYSTOSCOPY WITH RETROGRADE PYELOGRAM, URETEROSCOPY AND  STENT PLACEMENT, STONE BASKETRY;  Surgeon: Festus Aloe, MD;  Location: [facility];  Service: Urology;  Laterality: Left;  . FINGER SURGERY     index, growth removal  . HOLMIUM LASER APPLICATION Left 7/90/2409   Procedure: HOLMIUM LASER APPLICATION;  Surgeon: Festus Aloe, MD;  Location[facility];  Service: Urology;  Laterality: Left;  . IR IMAGING GUIDED PORT INSERTION  10/31/2018  . KIDNEY STONE SURGERY  2011 or 2012  . LITHOTRIPSY  2007     reports that she has quit smoking. Her smoking use included cigarettes. She has a 7.50 pack-year smoking history. She has never used smokeless tobacco. She reports current alcohol use. She reports that she does not use drugs.  Allergies  Allergen Reactions  . Barium Sulfate Diarrhea    Hypersensitivity intolerance reaction of stomach cramps and diarrhea.    Family History  Problem Relation Age of Onset  . Cancer Father        pancreatic  . Melanoma Father    Prior to Admission medications   Medication Sig Start Date End Date Taking? Authorizing Provider  folic acid (FOLVITE) 1 MG tablet Take 1 tablet (1 mg total) by mouth daily. Patient taking differently: Take 1 mg by mouth every evening.  12/10/18   Curt Bears, MD  gabapentin (NEURONTIN) 600 MG tablet Take 0.5 tablets (300 mg total) by mouth 3 (three) times daily. 03/27/19   Tanner, Lyndon Code., PA-C  ibuprofen (ADVIL) 800 MG tablet  10/24/18   [provider]  lidocaine-prilocaine (EMLA) cream Apply 1 application topically as needed. Patient taking differently: Apply 1  application topically as needed (For port-a-cath.).  10/18/18   Curt Bears, MD  magic mouthwash SOLN Take 5 mLs by mouth 4 (four) times daily as needed for mouth pain. Components - Hydrocortisone 60 mg, Nystatin suspension 30 ml , Benadryl 12.5mg  /5 ml QS 240 ml. 04/05/19   Curt Bears, MD  Melatonin 10 MG TABS Take 10 mg by mouth at bedtime.     [provider]  omeprazole (PRILOSEC) 40 MG capsule Take 1 capsule (40 mg total) by mouth daily. Patient taking differently: Take 40 mg by mouth every evening.  02/13/19   Tanner, Lyndon Code., PA-C  oxyCODONE (OXY IR/ROXICODONE) 5 MG immediate release tablet Take 1-2 tablets (5-10 mg total) by mouth every 4 (four) hours as needed for severe pain. 02/04/19   [name], MD  Potassium Chloride 40 MEQ/15ML (20%) SOLN Take 40 mEq by mouth daily for 4 days. 04/01/19 04/05/19  Curt Bears, MD  prochlorperazine (COMPAZINE) 10 MG tablet Take 1 tablet (10 mg total) by mouth every 6 (six) hours as needed for nausea or vomiting. Patient not taking: Reported on 04/01/2019 10/18/18   Curt Bears, MD  simvastatin (ZOCOR) 20 MG tablet Take 1 tablet (20 mg total) by mouth daily at 6 PM. Need annual appointment for further refills Patient taking differently: Take 20 mg by mouth every evening.  02/12/19   [name], MD  warfarin (COUMADIN) 5 MG tablet TAKE 1 TABLET(5 MG) BY MOUTH DAILY Patient taking differently: Take 2.5-5 mg by mouth every evening. Take 2.5mg  on Monday and Thursday and 5mg  the rest of the week. 12/21/18   Curt Bears, MD    Physical Exam:  Physical Exam Constitutional:      General: She is not in acute distress.    Appearance: She is well-developed. She is not diaphoretic.  Eyes:     Conjunctiva/sclera: Conjunctivae normal.     Pupils: Pupils are equal, round, and reactive to light.  Neck:     Musculoskeletal: Normal range of motion.  Cardiovascular:     Rate and Rhythm: Normal rate and regular rhythm.     Heart sounds: Normal heart sounds. No murmur.  Pulmonary:     Effort: Pulmonary effort is normal. No respiratory distress.     Breath sounds: Normal breath sounds. No wheezing or rales.  Abdominal:     General: Bowel sounds are normal. There is no distension.     Palpations: Abdomen is soft.     Tenderness: There is no abdominal tenderness. There is no guarding or  rebound.  Musculoskeletal: Normal range of motion.        General: No tenderness.     Right lower leg: No edema.     Left lower leg: No edema.  Lymphadenopathy:     Cervical: No cervical adenopathy.  Skin:    General: Skin is warm and dry.  Neurological:     Mental Status: She is alert and oriented to person, place, and time.     Labs on Admission: I have personally reviewed following labs and imaging studies  CBC: Recent Labs  Lab 04/15/19 0945  WBC 2.0*  NEUTROABS 0.9*  HGB 6.4*  HCT 18.9*  MCV 105.0*  PLT 17*    Basic Metabolic Panel: Recent Labs  Lab 04/15/19 0945  NA 137  K 2.6*  CL 104  CO2 23  GLUCOSE 115*  BUN 17  CREATININE 0.84  CALCIUM 8.3*    GFR: Estimated Creatinine Clearance: 60 mL/min (by C-G  formula based on SCr of 0.84 mg/dL).  Liver Function Tests: Recent Labs  Lab 04/15/19 0945  AST 78*  ALT 52*  ALKPHOS 112  BILITOT 0.8  PROT 6.7  ALBUMIN 2.9*   Urine analysis:    Component Value Date/Time   COLORURINE YELLOW 02/15/2019 1335   APPEARANCEUR CLOUDY (A) 02/15/2019 1335   LABSPEC 1.041 (H) 02/15/2019 1335   PHURINE 7.0 02/15/2019 1335   GLUCOSEU NEGATIVE 02/15/2019 1335   GLUCOSEU NEGATIVE 07/05/2017 1136   HGBUR SMALL (A) 02/15/2019 1335   BILIRUBINUR NEGATIVE 02/15/2019 1335   BILIRUBINUR neg 03/16/2018 1530   KETONESUR NEGATIVE 02/15/2019 1335   PROTEINUR 30 (A) 02/15/2019 1335   UROBILINOGEN 0.2 03/16/2018 1530   UROBILINOGEN 0.2 07/05/2017 1136   NITRITE NEGATIVE 02/15/2019 1335   LEUKOCYTESUR LARGE (A) 02/15/2019 1335     Radiological Exams on Admission: No results found.  Assessment/Plan Active Problems:   Pancytopenia (HCC)  Pancytopenia Neutropenia Likely secondary to chemotherapy. Baseline hemoglobin of 8-9 and with generally normal platelets between chemotherapy. Given one unit of platelets in the ED. Neutrophils of 900. -1 unit of PRBC -Transfuse for hgb <7 or platelets <10k or <20k with bleeding  -Hold coumadin -Discontinue platelet transfusion  Hypokalemia Given potassium supplementation in the ED -Give another potassium 40 meq x1 -Check magnesium -AM BMP  Hyperlipidemia -Hold simvastatin with mild LFT elevation  Non-occlusive thrombus of the Port A Cath -Hold Coumadin for now -Daily INR  AST/ALT elevation Mild -Repeat CMP  Diarrhea Likely secondary to chemotherapy -Imodium prn -IV fluids  Dysphagia Unknown etiology. Acute. Did not see any oral lesions on exam -Trial on diet. If significant, would benefit from inpatient consult to GI. Otherwise, would recommend outpatient follow-up  High-grade neuroendocrine carcinoma, metastatic Follows with Dr. Julien Nordmann as an outpatient. Currently undergoing chemotherapy. Was previously receiving XRT for rib and spinal mets.   DVT prophylaxis: SCDs Code Status: Full code Family Communication: Son on telephone Disposition Plan: Medical floor Consults called: Oncology by EDP Admission status: Observation   Cordelia Poche, MD [facility] 04/15/2019, 3:30 PM

## 2019-04-15 NOTE — ED Notes (Signed)
Bed: WA19 Expected date:  Expected time:  Means of arrival:  Comments: EMS 

## 2019-04-16 DIAGNOSIS — Z79899 Other long term (current) drug therapy: Secondary | ICD-10-CM | POA: Diagnosis not present

## 2019-04-16 DIAGNOSIS — E876 Hypokalemia: Secondary | ICD-10-CM | POA: Diagnosis present

## 2019-04-16 DIAGNOSIS — Z808 Family history of malignant neoplasm of other organs or systems: Secondary | ICD-10-CM | POA: Diagnosis not present

## 2019-04-16 DIAGNOSIS — R1310 Dysphagia, unspecified: Secondary | ICD-10-CM | POA: Diagnosis present

## 2019-04-16 DIAGNOSIS — R64 Cachexia: Secondary | ICD-10-CM | POA: Diagnosis present

## 2019-04-16 DIAGNOSIS — Z923 Personal history of irradiation: Secondary | ICD-10-CM | POA: Diagnosis not present

## 2019-04-16 DIAGNOSIS — R197 Diarrhea, unspecified: Secondary | ICD-10-CM | POA: Diagnosis not present

## 2019-04-16 DIAGNOSIS — D649 Anemia, unspecified: Secondary | ICD-10-CM | POA: Diagnosis not present

## 2019-04-16 DIAGNOSIS — Z800 Family history of malignant neoplasm of digestive organs: Secondary | ICD-10-CM | POA: Diagnosis not present

## 2019-04-16 DIAGNOSIS — K909 Intestinal malabsorption, unspecified: Secondary | ICD-10-CM

## 2019-04-16 DIAGNOSIS — Z87440 Personal history of urinary (tract) infections: Secondary | ICD-10-CM | POA: Diagnosis not present

## 2019-04-16 DIAGNOSIS — D701 Agranulocytosis secondary to cancer chemotherapy: Secondary | ICD-10-CM | POA: Diagnosis not present

## 2019-04-16 DIAGNOSIS — D61818 Other pancytopenia: Secondary | ICD-10-CM | POA: Diagnosis not present

## 2019-04-16 DIAGNOSIS — Z87442 Personal history of urinary calculi: Secondary | ICD-10-CM | POA: Diagnosis not present

## 2019-04-16 DIAGNOSIS — Y832 Surgical operation with anastomosis, bypass or graft as the cause of abnormal reaction of the patient, or of later complication, without mention of misadventure at the time of the procedure: Secondary | ICD-10-CM | POA: Diagnosis present

## 2019-04-16 DIAGNOSIS — M8580 Other specified disorders of bone density and structure, unspecified site: Secondary | ICD-10-CM | POA: Diagnosis present

## 2019-04-16 DIAGNOSIS — Z6822 Body mass index (BMI) 22.0-22.9, adult: Secondary | ICD-10-CM | POA: Diagnosis not present

## 2019-04-16 DIAGNOSIS — Z20828 Contact with and (suspected) exposure to other viral communicable diseases: Secondary | ICD-10-CM | POA: Diagnosis present

## 2019-04-16 DIAGNOSIS — T451X5A Adverse effect of antineoplastic and immunosuppressive drugs, initial encounter: Secondary | ICD-10-CM | POA: Diagnosis present

## 2019-04-16 DIAGNOSIS — Y712 Prosthetic and other implants, materials and accessory cardiovascular devices associated with adverse incidents: Secondary | ICD-10-CM | POA: Diagnosis present

## 2019-04-16 DIAGNOSIS — C7951 Secondary malignant neoplasm of bone: Secondary | ICD-10-CM | POA: Diagnosis present

## 2019-04-16 DIAGNOSIS — D61810 Antineoplastic chemotherapy induced pancytopenia: Secondary | ICD-10-CM | POA: Diagnosis present

## 2019-04-16 DIAGNOSIS — T82868A Thrombosis of vascular prosthetic devices, implants and grafts, initial encounter: Secondary | ICD-10-CM | POA: Diagnosis present

## 2019-04-16 DIAGNOSIS — N189 Chronic kidney disease, unspecified: Secondary | ICD-10-CM | POA: Diagnosis present

## 2019-04-16 DIAGNOSIS — Z79890 Hormone replacement therapy (postmenopausal): Secondary | ICD-10-CM | POA: Diagnosis not present

## 2019-04-16 DIAGNOSIS — C7A1 Malignant poorly differentiated neuroendocrine tumors: Secondary | ICD-10-CM | POA: Diagnosis present

## 2019-04-16 DIAGNOSIS — E785 Hyperlipidemia, unspecified: Secondary | ICD-10-CM | POA: Diagnosis present

## 2019-04-16 DIAGNOSIS — Z7901 Long term (current) use of anticoagulants: Secondary | ICD-10-CM | POA: Diagnosis not present

## 2019-04-16 DIAGNOSIS — G4700 Insomnia, unspecified: Secondary | ICD-10-CM | POA: Diagnosis present

## 2019-04-16 DIAGNOSIS — K521 Toxic gastroenteritis and colitis: Secondary | ICD-10-CM | POA: Diagnosis present

## 2019-04-16 LAB — COMPREHENSIVE METABOLIC PANEL
ALT: 40 U/L (ref 0–44)
AST: 54 U/L — ABNORMAL HIGH (ref 15–41)
Albumin: 2.7 g/dL — ABNORMAL LOW (ref 3.5–5.0)
Alkaline Phosphatase: 94 U/L (ref 38–126)
Anion gap: 5 (ref 5–15)
BUN: 12 mg/dL (ref 8–23)
CO2: 21 mmol/L — ABNORMAL LOW (ref 22–32)
Calcium: 7.7 mg/dL — ABNORMAL LOW (ref 8.9–10.3)
Chloride: 114 mmol/L — ABNORMAL HIGH (ref 98–111)
Creatinine, Ser: 0.7 mg/dL (ref 0.44–1.00)
GFR calc Af Amer: >60 mL/min (ref >60)
GFR calc non Af Amer: >60 mL/min (ref >60)
Glucose, Bld: 91 mg/dL (ref 70–99)
Potassium: 3 mmol/L — ABNORMAL LOW (ref 3.5–5.1)
Sodium: 140 mmol/L (ref 135–145)
Total Bilirubin: 1 mg/dL (ref 0.3–1.2)
Total Protein: 6 g/dL — ABNORMAL LOW (ref 6.5–8.1)

## 2019-04-16 LAB — CBC
HCT: 22.4 % — ABNORMAL LOW (ref 36.0–46.0)
Hemoglobin: 7.3 g/dL — ABNORMAL LOW (ref 12.0–15.0)
MCH: 33.6 pg (ref 26.0–34.0)
MCHC: 32.6 g/dL (ref 30.0–36.0)
MCV: 103.2 fL — ABNORMAL HIGH (ref 80.0–100.0)
Platelets: 20 10*3/uL — CL (ref 150–400)
RBC: 2.17 MIL/uL — ABNORMAL LOW (ref 3.87–5.11)
RDW: 16.9 % — ABNORMAL HIGH (ref 11.5–15.5)
WBC: 2 10*3/uL — ABNORMAL LOW (ref 4.0–10.5)
nRBC: 0 % (ref 0.0–0.2)

## 2019-04-16 LAB — C DIFFICILE QUICK SCREEN W PCR REFLEX
C Diff antigen: NEGATIVE
C Diff interpretation: NOT DETECTED
C Diff toxin: NEGATIVE

## 2019-04-16 LAB — PROTIME-INR
INR: 1.4 — ABNORMAL HIGH (ref 0.8–1.2)
Prothrombin Time: 17 seconds — ABNORMAL HIGH (ref 11.4–15.2)

## 2019-04-16 LAB — VITAMIN B12: Vitamin B-12: 757 pg/mL (ref 180–914)

## 2019-04-16 MED ORDER — HYDROXYZINE HCL 25 MG PO TABS
25.0000 mg | ORAL_TABLET | Freq: Once | ORAL | Status: AC
  Administered 2019-04-16: 25 mg via ORAL
  Filled 2019-04-16: qty 1

## 2019-04-16 MED ORDER — GERHARDT'S BUTT CREAM
TOPICAL_CREAM | CUTANEOUS | Status: DC | PRN
  Filled 2019-04-16: qty 1

## 2019-04-16 MED ORDER — SODIUM CHLORIDE 0.9% FLUSH
10.0000 mL | INTRAVENOUS | Status: DC | PRN
  Administered 2019-04-17: 10 mL
  Filled 2019-04-16: qty 40

## 2019-04-16 MED ORDER — POTASSIUM CHLORIDE 10 MEQ/50ML IV SOLN
10.0000 meq | INTRAVENOUS | Status: AC
  Administered 2019-04-16 (×6): 10 meq via INTRAVENOUS
  Filled 2019-04-16 (×6): qty 50

## 2019-04-16 NOTE — Care Management Obs Status (Signed)
[facility] NOTIFICATION   Patient Details  Name: Alison Sullivan MRN: [id number] Date of Birth: 09-27-1951   Medicare Observation Status Notification Given:  Yes    Leeroy Cha, RN 04/16/2019, 10:25 AM

## 2019-04-16 NOTE — Care Management Important Message (Signed)
Important Message  Patient Details  Name: Alison Sullivan MRN: [id number] Date of Birth: Feb 15, 1951   Medicare Important Message Given:       Leeroy Cha, RN 04/16/2019, 10:25 AM

## 2019-04-16 NOTE — Progress Notes (Signed)
PROGRESS NOTE    Alison Sullivan  STM:[id number] DOB: [birth year] DOA: 04/15/2019 PCP: [name], MD   Brief Narrative:  Alison Sullivan is a 68 y.o. female with medical history significant of metastatic high-grade neuroendocrine carcinoma, hyperlipidemia, nonocclusive thrombus under the Port-A-Cath.  Patient presented secondary to a 3 to 4-day history of worsening weakness, diarrhea, dysphasia.  She was found to be anemic with hemoglobin of 6.4.  She received 1 unit of PRBC transfusion.  Also has pancytopenia with no signs of bleeding.  So had severe hypokalemia of 2.6 upon presentation.  Replacement done.  Currently 3.0.  Continues to have diarrhea, has had 7 episodes of diarrhea this morning.  Check C. difficile which is negative.  Continues to be on Imodium.  Consultants:   None  Procedures:   None  Antimicrobials:   None   Subjective: Patient seen and examined.  Her only complaint is diarrhea.  She has had 7 episodes of diarrhea this morning.  No other complaint other than mild dysphagia.  Objective: Vitals:   04/15/19 1721 04/15/19 1745 04/15/19 2100 04/16/19 0435  BP: 122/66  116/71 121/75  Pulse: 74  80 87  Resp: 16  17 17   Temp: 97.7 F (36.5 C)  98.6 F (37 C) 98.4 F (36.9 C)  TempSrc:  Oral Oral Oral  SpO2: 99%  99% 96%  Weight:      Height:        Intake/Output Summary (Last 24 hours) at 04/16/2019 1240 Last data filed at 04/16/2019 0300 Gross per 24 hour  Intake 1480.24 ml  Output -  Net 1480.24 ml   Filed Weights   04/15/19 0840  Weight: 62.6 kg    Examination:  General exam: Appears calm and comfortable but cachectic Respiratory system: Clear to auscultation. Respiratory effort normal. Cardiovascular system: S1 & S2 heard, RRR. No JVD, murmurs, rubs, gallops or clicks. No pedal edema. Gastrointestinal system: Abdomen is nondistended, soft and nontender. No organomegaly or masses felt. Normal bowel sounds heard. Central nervous  system: Alert and oriented. No focal neurological deficits. Extremities: Symmetric 5 x 5 power. Skin: No rashes, lesions or ulcers Psychiatry: Judgement and insight appear normal. Mood & affect appropriate.    Data Reviewed: I have personally reviewed following labs and imaging studies  CBC: Recent Labs  Lab 04/15/19 0945 04/16/19 0653  WBC 2.0* 2.0*  NEUTROABS 0.9*  --   HGB 6.4* 7.3*  HCT 18.9* 22.4*  MCV 105.0* 103.2*  PLT 17* 20*   Basic Metabolic Panel: Recent Labs  Lab 04/15/19 0945 04/16/19 0653  NA 137 140  K 2.6* 3.0*  CL 104 114*  CO2 23 21*  GLUCOSE 115* 91  BUN 17 12  CREATININE 0.84 0.70  CALCIUM 8.3* 7.7*  MG 1.7  --    GFR: Estimated Creatinine Clearance: 63 mL/min (by C-G formula based on SCr of 0.7 mg/dL). Liver Function Tests: Recent Labs  Lab 04/15/19 0945 04/16/19 0653  AST 78* 54*  ALT 52* 40  ALKPHOS 112 94  BILITOT 0.8 1.0  PROT 6.7 6.0*  ALBUMIN 2.9* 2.7*   No results for input(s): LIPASE, AMYLASE in the last 168 hours. No results for input(s): AMMONIA in the last 168 hours. Coagulation Profile: Recent Labs  Lab 04/16/19 0653  INR 1.4*   Cardiac Enzymes: No results for input(s): CKTOTAL, CKMB, CKMBINDEX, TROPONINI in the last 168 hours. BNP (last 3 results) No results for input(s): PROBNP in the last 8760 hours. HbA1C: No results  for input(s): HGBA1C in the last 72 hours. CBG: No results for input(s): GLUCAP in the last 168 hours. Lipid Profile: No results for input(s): CHOL, HDL, LDLCALC, TRIG, CHOLHDL, LDLDIRECT in the last 72 hours. Thyroid Function Tests: No results for input(s): TSH, T4TOTAL, FREET4, T3FREE, THYROIDAB in the last 72 hours. Anemia Panel: No results for input(s): VITAMINB12, FOLATE, FERRITIN, TIBC, IRON, RETICCTPCT in the last 72 hours. Sepsis Labs: No results for input(s): PROCALCITON, LATICACIDVEN in the last 168 hours.  Recent Results (from the past 240 hour(s))  SARS Coronavirus 2 (CEPHEID -  Performed in [place] hospital lab), Hosp Order     Status: None   Collection Time: 04/15/19  3:02 PM  Result Value Ref Range Status   SARS Coronavirus 2 NEGATIVE NEGATIVE Final    Comment: (NOTE) If result is NEGATIVE SARS-CoV-2 target nucleic acids are NOT DETECTED. The SARS-CoV-2 RNA is generally detectable in upper and lower  respiratory specimens during the acute phase of infection. The lowest  concentration of SARS-CoV-2 viral copies this assay can detect is 250  copies / mL. A negative result does not preclude SARS-CoV-2 infection  and should not be used as the sole basis for treatment or other  patient management decisions.  A negative result may occur with  improper specimen collection / handling, submission of specimen other  than nasopharyngeal swab, presence of viral mutation(s) within the  areas targeted by this assay, and inadequate number of viral copies  (<250 copies / mL). A negative result must be combined with clinical  observations, patient history, and epidemiological information. If result is POSITIVE SARS-CoV-2 target nucleic acids are DETECTED. The SARS-CoV-2 RNA is generally detectable in upper and lower  respiratory specimens dur ing the acute phase of infection.  Positive  results are indicative of active infection with SARS-CoV-2.  Clinical  correlation with patient history and other diagnostic information is  necessary to determine patient infection status.  Positive results do  not rule out bacterial infection or co-infection with other viruses. If result is PRESUMPTIVE POSTIVE SARS-CoV-2 nucleic acids MAY BE PRESENT.   A presumptive positive result was obtained on the submitted specimen  and confirmed on repeat testing.  While 2019 novel coronavirus  (SARS-CoV-2) nucleic acids may be present in the submitted sample  additional confirmatory testing may be necessary for epidemiological  and / or clinical management purposes  to differentiate between   SARS-CoV-2 and other Sarbecovirus currently known to infect humans.  If clinically indicated additional testing with an alternate test  methodology [phone]) is advised. The SARS-CoV-2 RNA is generally  detectable in upper and lower respiratory sp ecimens during the acute  phase of infection. The expected result is Negative. Fact Sheet for Patients:  StrictlyIdeas.no Fact Sheet for Healthcare Providers: BankingDealers.co.za This test is not yet approved or cleared by the [place] FDA and has been authorized for detection and/or diagnosis of SARS-CoV-2 by FDA under an Emergency Use Authorization (EUA).  This EUA will remain in effect (meaning this test can be used) for the duration of the COVID-19 declaration under Section 564(b)(1) of the Act, 21 U.S.C. section 360bbb-3(b)(1), unless the authorization is terminated or revoked sooner. Performed at [facility], [place] [address], [place], [place] [address]   C difficile quick scan w PCR reflex     Status: None   Collection Time: 04/16/19  9:41 AM  Result Value Ref Range Status   C Diff antigen NEGATIVE NEGATIVE Final   C Diff toxin  NEGATIVE NEGATIVE Final   C Diff interpretation No C. difficile detected.  Final    Comment: Performed at [facility], [place] [address] [place], [place] [address]      Radiology Studies: No results found.  Scheduled Meds: . sodium chloride   Intravenous Once  . folic acid  1 mg Oral QPM  . Melatonin  10 mg Oral QHS   Continuous Infusions: . sodium chloride    . sodium chloride 100 mL/hr at 04/16/19 0300  . potassium chloride 10 mEq (04/16/19 1210)     LOS: 0 days   Assessment & Plan:   Active Problems:   Hyperlipidemia   Large cell carcinoma of left lung, stage 4 (HCC)   Neutropenia (HCC)   Pancytopenia (HCC)   Pancytopenia: White cells and platelets stable with no signs of bleeding.  Hemoglobin  improved to 7.3.  Recheck in the morning.  Severe hypokalemia: Only 3.0.  Will replace through IV and and IV fluids.  Recheck in the morning.  Monitor on telemetry.  Check magnesium.  Nonocclusive thrombus of Port-A-Cath: Holding Coumadin due to anemia.  Diarrhea: C. difficile ruled out.  Likely secondary to chemotherapy.  Continue Imodium as needed.  Continue IV fluids.  Dysphagia: Unknown etiology but could very well be due to generalized weakness.  Consult SLP.  High-grade neuroendocrine carcinoma, metastatic: Oral to Dr. Julien Nordmann as an outpatient.  Currently on chemo.  Has received radiation in the past.  DVT prophylaxis: SCD Code Status: Full code Family Communication: Discussed with patient.  She is alert and competent. Disposition Plan: Potential discharge tomorrow.   Time spent: 30 minutes   Darliss Cheney, MD [facility] Pager [phone]  If 7PM-7AM, please contact night-coverage [email or web address] Password [email or web address] 04/16/2019, 12:40 PM

## 2019-04-17 LAB — BASIC METABOLIC PANEL
Anion gap: 5 (ref 5–15)
BUN: 8 mg/dL (ref 8–23)
CO2: 21 mmol/L — ABNORMAL LOW (ref 22–32)
Calcium: 7.7 mg/dL — ABNORMAL LOW (ref 8.9–10.3)
Chloride: 111 mmol/L (ref 98–111)
Creatinine, Ser: 0.71 mg/dL (ref 0.44–1.00)
GFR calc Af Amer: >60 mL/min (ref >60)
GFR calc non Af Amer: >60 mL/min (ref >60)
Glucose, Bld: 123 mg/dL — ABNORMAL HIGH (ref 70–99)
Potassium: 3.3 mmol/L — ABNORMAL LOW (ref 3.5–5.1)
Sodium: 137 mmol/L (ref 135–145)

## 2019-04-17 LAB — MAGNESIUM: Magnesium: 1.3 mg/dL — ABNORMAL LOW (ref 1.7–2.4)

## 2019-04-17 LAB — CBC WITH DIFFERENTIAL/PLATELET
Abs Immature Granulocytes: 0.03 10*3/uL (ref 0.00–0.07)
Abs Immature Granulocytes: 0.02 10*3/uL (ref 0.00–0.07)
Basophils Absolute: 0 10*3/uL (ref 0.0–0.1)
Basophils Absolute: 0 10*3/uL (ref 0.0–0.1)
Basophils Relative: 0 %
Basophils Relative: 0 %
Eosinophils Absolute: 0 10*3/uL (ref 0.0–0.5)
Eosinophils Absolute: 0.1 10*3/uL (ref 0.0–0.5)
Eosinophils Relative: 2 %
Eosinophils Relative: 4 %
HCT: 18.9 % — ABNORMAL LOW (ref 36.0–46.0)
HCT: 22.9 % — ABNORMAL LOW (ref 36.0–46.0)
Hemoglobin: 6.4 g/dL — CL (ref 12.0–15.0)
Hemoglobin: 7.4 g/dL — ABNORMAL LOW (ref 12.0–15.0)
Immature Granulocytes: 2 %
Immature Granulocytes: 1 %
Lymphocytes Relative: 28 %
Lymphocytes Relative: 30 %
Lymphs Abs: 0.6 10*3/uL — ABNORMAL LOW (ref 0.7–4.0)
Lymphs Abs: 0.6 10*3/uL — ABNORMAL LOW (ref 0.7–4.0)
MCH: 35.6 pg — ABNORMAL HIGH (ref 26.0–34.0)
MCH: 33.6 pg (ref 26.0–34.0)
MCHC: 33.9 g/dL (ref 30.0–36.0)
MCHC: 32.3 g/dL (ref 30.0–36.0)
MCV: 105 fL — ABNORMAL HIGH (ref 80.0–100.0)
MCV: 104.1 fL — ABNORMAL HIGH (ref 80.0–100.0)
Monocytes Absolute: 0.5 10*3/uL (ref 0.1–1.0)
Monocytes Absolute: 0.5 10*3/uL (ref 0.1–1.0)
Monocytes Relative: 24 %
Monocytes Relative: 27 %
Neutro Abs: 0.9 10*3/uL — ABNORMAL LOW (ref 1.7–7.7)
Neutro Abs: 0.7 10*3/uL — ABNORMAL LOW (ref 1.7–7.7)
Neutrophils Relative %: 44 %
Neutrophils Relative %: 38 %
Platelets: 17 10*3/uL — CL (ref 150–400)
Platelets: 30 10*3/uL — ABNORMAL LOW (ref 150–400)
RBC: 1.8 MIL/uL — ABNORMAL LOW (ref 3.87–5.11)
RBC: 2.2 MIL/uL — ABNORMAL LOW (ref 3.87–5.11)
RDW: 14.9 % (ref 11.5–15.5)
RDW: 16.9 % — ABNORMAL HIGH (ref 11.5–15.5)
WBC: 2 10*3/uL — ABNORMAL LOW (ref 4.0–10.5)
WBC: 1.9 10*3/uL — ABNORMAL LOW (ref 4.0–10.5)
nRBC: 0 % (ref 0.0–0.2)
nRBC: 0 % (ref 0.0–0.2)

## 2019-04-17 MED ORDER — POTASSIUM CHLORIDE 20 MEQ/15ML (10%) PO SOLN
40.0000 meq | Freq: Every day | ORAL | Status: DC
  Administered 2019-04-17: 40 meq via ORAL
  Filled 2019-04-17 (×2): qty 30

## 2019-04-17 MED ORDER — MAGNESIUM SULFATE 2 GM/50ML IV SOLN
2.0000 g | Freq: Once | INTRAVENOUS | Status: AC
  Administered 2019-04-17: 2 g via INTRAVENOUS
  Filled 2019-04-17: qty 50

## 2019-04-17 MED ORDER — HYDROXYZINE HCL 25 MG PO TABS
25.0000 mg | ORAL_TABLET | Freq: Three times a day (TID) | ORAL | Status: DC | PRN
  Administered 2019-04-17 – 2019-04-19 (×5): 25 mg via ORAL
  Filled 2019-04-17 (×5): qty 1

## 2019-04-17 MED ORDER — POTASSIUM CHLORIDE CRYS ER 20 MEQ PO TBCR: 40.0000 meq | EXTENDED_RELEASE_TABLET | Freq: Every day | ORAL | Status: DC

## 2019-04-17 NOTE — Progress Notes (Signed)
[facility] PROGRESS NOTE  Alison Sullivan MRN:[id number] DOB: 07/13/1951 DOA: 04/15/2019 PCP: Crawford, Elizabeth A, MD   Narrative: 68 year-old Caucasian female Stage IV T1b, N2, M1 C neuroendocrine CA since 09/2018-Dr. [name]-complicated by rib pain from met--XRT Dr. Squire Second opinion [facility] 03/15/2019-then went to [facility] recommended to continue management--supposed to follow-up with Dr. Stephan Grant after getting NGS/guardant 360 Blood clot tip Port-A-Cath on Coumadin   A & Plan Stage IV neuroendocrine cancer lung stage IV Rib pain status post XRT Pancytopenia likely related to chemo Cancer related cachexia with BMI 22 Received 5 cycles of therapy-getting second opinion at [facility] Have asked oncology to formally weigh in regarding need for Nplate versus Granix She had an appointment at [facility] which has been canceled for 6/4 and she will do a virtual visit instead Continue gabapentin 300 daily as needed neuropathy, oxycodone 5-10 every 4 as needed pain Nonocclusive thrombus at the tip of Port-A-Cath Discussed with Dr. [name]-would hold Coumadin until platelets are above 50 We will need to see what her platelets are over the next couple of days Hypomagnesemia, hypokalemia likely secondary to diarrhea Replacing with 2 g IV magnesium now, replace p.o. potassium Repeat labs a.m. Imodium 2 mg as needed Dysphagia-follow-up with SLP Diarrhea Likely chemotherapy related and resolved with Imodium  DVT SCDs code Status: At this time full code communication: Patient alone disposition Plan: Discharge when able   Samtani, MD  [facility] Via amion app OR [email or web address] 7PM-7AM contact night coverage as above 04/17/2019, 10:32 AM  LOS: 1 day   Consultants:  Oncologist  Procedures:  None  Antimicrobials:  None  Interval history/Subjective: Awake alert pleasant Relates entire history to me No pain No diarrhea at present No  itching  Objective:  Vitals:  Vitals:   04/16/19 2041 04/17/19 0537  BP: 124/73 116/73  Pulse: 86 90  Resp: 16 17  Temp: 98.8 F (37.1 C) 98.2 F (36.8 C)  SpO2: 99% 96%    Exam:  EOMI NCAT pleasant S1-S2 no murmur rub or gallop Abdomen soft no rebound no guarding Chest clear Neck soft supple no thyromegaly no submandibular lymphadenopathy Neurologically intact no focal deficit Skin intact   I have personally reviewed the following:  DATA   Labs:  Platelets up to 30 WBC 1.9 hemoglobin 7.4 MCV 104  ANC 700  Magnesium 1.3, potassium 3.3   Decision to obtain old records:  Yes reviewed  Review and summation of old records:  yes thoroughly reviewed  Scheduled Meds: . sodium chloride   Intravenous Once  . folic acid  1 mg Oral QPM  . Melatonin  10 mg Oral QHS  . potassium chloride  40 mEq Oral Daily   Continuous Infusions: . sodium chloride    . sodium chloride 100 mL/hr at 04/17/19 0012  . magnesium sulfate bolus IVPB 2 g (04/17/19 1026)    Active Problems:   Hyperlipidemia   Large cell carcinoma of left lung, stage 4 (HCC)   Neutropenia (HCC)   Pancytopenia (HCC)   LOS: 1 day          

## 2019-04-17 NOTE — Evaluation (Signed)
Clinical/Bedside Swallow Evaluation Patient Details  Name: Alison Sullivan MRN: [id number] Date of Birth: 12-Dec-1950  Today's Date: 04/17/2019 Time: SLP Start Time (ACUTE ONLY): 6213 SLP Stop Time (ACUTE ONLY): 1345 SLP Time Calculation (min) (ACUTE ONLY): 48 min  Past Medical History:  Past Medical History:  Diagnosis Date  . Arthritis   . Chronic kidney disease   . Dog bite of wrist   . History of kidney stones   . History of radiation therapy 10/23/18- 11/06/18   Left lower rib mass/ 30 Gy delivered in 10 fractions 3 Gy.   . Kidney infection   . Left renal mass   . Osteopenia   . Pyelonephritis   . Recurrent UTI (urinary tract infection)    Past Surgical History:  Past Surgical History:  Procedure Laterality Date  . CYSTOSCOPY WITH RETROGRADE PYELOGRAM, URETEROSCOPY AND STENT PLACEMENT Left 12/28/2017   Procedure: CYSTOSCOPY WITH RETROGRADE PYELOGRAM, URETEROSCOPY AND STENT PLACEMENT, STONE BASKETRY;  Surgeon: Festus Aloe, MD;  Location: [facility];  Service: Urology;  Laterality: Left;  . FINGER SURGERY     index, growth removal  . HOLMIUM LASER APPLICATION Left 0/86/5784   Procedure: HOLMIUM LASER APPLICATION;  Surgeon: Festus Aloe, MD;  Location: [facility];  Service: Urology;  Laterality: Left;  . IR IMAGING GUIDED PORT INSERTION  10/31/2018  . KIDNEY STONE SURGERY  2011 or 2012  . LITHOTRIPSY  2007   HPI:  68 yo female with h/o neuroendocrine carncinoma, pancytopenia admitted with diarrhea and worsening weakness.  Pt found to be anemic and requiring 1 unit of PRBC.  Severe hypokalemia noted upon admit and pt is now on potassium.  Pt with cerebellum and thalami old small vessel infarcts per MRI 10/2018.  Pt with tumors left periaortic, necrotic percardial, LUL nodule, left 10th rib thickness consistent with tumor.  Pt has received radiation and complains of some xerostomia.  She denies dysphagia to food or drinks alone, only to  larger pills.  States large pills will make her gag and she will sometime vomit all of her medications at that point.  She also states she known when they are have not cleared the esophagus and this will make it come back up.  Per pt, PA at cancer center, questions if radiation is source of her gagging.  Pt reports she eats several small meals/day.     Assessment / Plan / Recommendation Clinical Impression  Pt with negative CN exam and no evidence of dysphagia with consumption of solids and liquids during evaluation.  She was observed consuming a small pill with water administered by RN.  Majority of session was focused on reviewing compensation strategies for her large pill dysphagia.  She reports taking pills with pudding or applesauce has not been helpful.  Xerostomia also reported - thus provided her with compensations for this.   Taste and texture sensitivites reported.  Using cold liquids to prevent breakdown of pill, drinking water first to assure adequately moistened in oropharynx, masticating viscous food (eg bread) and place pill in masticated bolus to swallow.  Also advised she speak to pharmacist regarding options for pill administration *suspension, smaller pills/tablets, coated, etc.  SLP also questions if pt's cancer may be inducing gag and subsequent vomiting? All education completed, Thanks for this consult.   SLP Visit Diagnosis: Dysphagia, unspecified (R13.10)    Aspiration Risk  Mild aspiration risk    Diet Recommendation Regular;Thin liquid   Liquid Administration via: Cup;Straw Medication Administration: (defer to pt ,  start all pill administration with liquids) Supervision: Patient able to self feed Compensations: Slow rate;Small sips/bites(start intake with liquids)    Other  Recommendations Oral Care Recommendations: Oral care QID   Follow up Recommendations None      Frequency and Duration            Prognosis        Swallow Study   General Date of Onset:  04/17/19 HPI: 68 yo female with h/o neuroendocrine carncinoma, pancytopenia admitted with diarrhea and worsening weakness.  Pt found to be anemic and requiring 1 unit of PRBC.  Severe hypokalemia noted upon admit and pt is now on potassium.  Pt with cerebellum and thalami old small vessel infarcts per MRI 10/2018.  Pt with tumors left periaortic, necrotic percardial, LUL nodule, left 10th rib thickness consistent with tumor.  Pt has received radiation and complains of some xerostomia.  She denies dysphagia to food or drinks alone, only to larger pills.  States large pills will make her gag and she will sometime vomit all of her medications at that point.  She also states she known when they are have not cleared the esophagus and this will make it come back up.  Per pt, PA at cancer center, questions if radiation is source of her gagging.  Pt reports she eats several small meals/day.   Type of Study: Bedside Swallow Evaluation Diet Prior to this Study: Regular;Thin liquids Temperature Spikes Noted: No Respiratory Status: Room air History of Recent Intubation: No Behavior/Cognition: Alert;Cooperative;Pleasant mood Oral Cavity Assessment: Within Functional Limits Oral Care Completed by SLP: No Oral Cavity - Dentition: Adequate natural dentition Vision: Functional for self-feeding Patient Positioning: Upright in bed Baseline Vocal Quality: Normal Volitional Cough: (dnt) Volitional Swallow: Able to elicit    Oral/Motor/Sensory Function Overall Oral Motor/Sensory Function: Within functional limits   Ice Chips Ice chips: Not tested   Thin Liquid Thin Liquid: Within functional limits Presentation: Cup;Self Fed    Nectar Thick Nectar Thick Liquid: Not tested   Honey Thick Honey Thick Liquid: Not tested   Puree Puree: Not tested   Solid     Solid: Within functional limits Presentation: Self Fed      Macario Golds 04/17/2019,2:56 PM   Luanna Salk, [facility] SLP Acute Rehab  Services Pager [phone] Office [phone]

## 2019-04-18 LAB — COMPREHENSIVE METABOLIC PANEL
ALT: 27 U/L (ref 0–44)
AST: 35 U/L (ref 15–41)
Albumin: 2.4 g/dL — ABNORMAL LOW (ref 3.5–5.0)
Alkaline Phosphatase: 91 U/L (ref 38–126)
Anion gap: 5 (ref 5–15)
BUN: 6 mg/dL — ABNORMAL LOW (ref 8–23)
CO2: 21 mmol/L — ABNORMAL LOW (ref 22–32)
Calcium: 7.7 mg/dL — ABNORMAL LOW (ref 8.9–10.3)
Chloride: 111 mmol/L (ref 98–111)
Creatinine, Ser: 0.67 mg/dL (ref 0.44–1.00)
GFR calc Af Amer: >60 mL/min (ref >60)
GFR calc non Af Amer: >60 mL/min (ref >60)
Glucose, Bld: 87 mg/dL (ref 70–99)
Potassium: 3.8 mmol/L (ref 3.5–5.1)
Sodium: 137 mmol/L (ref 135–145)
Total Bilirubin: 0.3 mg/dL (ref 0.3–1.2)
Total Protein: 5.7 g/dL — ABNORMAL LOW (ref 6.5–8.1)

## 2019-04-18 LAB — CBC WITH DIFFERENTIAL/PLATELET
Abs Immature Granulocytes: 0.02 10*3/uL (ref 0.00–0.07)
Basophils Absolute: 0 10*3/uL (ref 0.0–0.1)
Basophils Relative: 0 %
Eosinophils Absolute: 0.1 10*3/uL (ref 0.0–0.5)
Eosinophils Relative: 4 %
HCT: 21.9 % — ABNORMAL LOW (ref 36.0–46.0)
Hemoglobin: 7.1 g/dL — ABNORMAL LOW (ref 12.0–15.0)
Immature Granulocytes: 1 %
Lymphocytes Relative: 30 %
Lymphs Abs: 0.6 10*3/uL — ABNORMAL LOW (ref 0.7–4.0)
MCH: 34 pg (ref 26.0–34.0)
MCHC: 32.4 g/dL (ref 30.0–36.0)
MCV: 104.8 fL — ABNORMAL HIGH (ref 80.0–100.0)
Monocytes Absolute: 0.7 10*3/uL (ref 0.1–1.0)
Monocytes Relative: 34 %
Neutro Abs: 0.7 10*3/uL — ABNORMAL LOW (ref 1.7–7.7)
Neutrophils Relative %: 31 %
Platelets: 38 10*3/uL — ABNORMAL LOW (ref 150–400)
RBC: 2.09 MIL/uL — ABNORMAL LOW (ref 3.87–5.11)
RDW: 16.7 % — ABNORMAL HIGH (ref 11.5–15.5)
WBC: 2.1 10*3/uL — ABNORMAL LOW (ref 4.0–10.5)
nRBC: 0 % (ref 0.0–0.2)

## 2019-04-18 LAB — PREPARE RBC (CROSSMATCH)

## 2019-04-18 LAB — FOLATE RBC
Folate, Hemolysate: 311 ng/mL
Folate, RBC: 1555 ng/mL (ref >498)
Hematocrit: 20 % — ABNORMAL LOW (ref 34.0–46.6)

## 2019-04-18 LAB — PROTIME-INR
INR: 1.4 — ABNORMAL HIGH (ref 0.8–1.2)
Prothrombin Time: 16.7 seconds — ABNORMAL HIGH (ref 11.4–15.2)

## 2019-04-18 LAB — MAGNESIUM: Magnesium: 2.1 mg/dL (ref 1.7–2.4)

## 2019-04-18 MED ORDER — SODIUM CHLORIDE 0.9% IV SOLUTION
Freq: Once | INTRAVENOUS | Status: AC
  Administered 2019-04-18: 11:00:00 via INTRAVENOUS

## 2019-04-18 MED ORDER — FUROSEMIDE 10 MG/ML IJ SOLN
20.0000 mg | Freq: Once | INTRAMUSCULAR | Status: AC
  Administered 2019-04-18: 20 mg via INTRAVENOUS
  Filled 2019-04-18: qty 2

## 2019-04-18 MED ORDER — DIPHENHYDRAMINE HCL 25 MG PO CAPS
25.0000 mg | ORAL_CAPSULE | Freq: Once | ORAL | Status: AC
  Administered 2019-04-18: 25 mg via ORAL
  Filled 2019-04-18: qty 1

## 2019-04-18 MED ORDER — ACETAMINOPHEN 325 MG PO TABS
650.0000 mg | ORAL_TABLET | Freq: Once | ORAL | Status: AC
  Administered 2019-04-18: 650 mg via ORAL
  Filled 2019-04-18: qty 2

## 2019-04-18 MED ORDER — POTASSIUM CHLORIDE CRYS ER 10 MEQ PO TBCR
40.0000 meq | EXTENDED_RELEASE_TABLET | Freq: Every day | ORAL | Status: DC
  Administered 2019-04-18: 40 meq via ORAL
  Filled 2019-04-18: qty 4

## 2019-04-18 NOTE — Progress Notes (Signed)
[facility] PROGRESS NOTE  Alison Sullivan PJK:[id number] DOB: 1951/07/10 DOA: 04/15/2019 PCP: [name], MD   Narrative: 68 year-old Caucasian female Stage IV T1b, N2, M1 C neuroendocrine CA since 80/9983-JA. [name]-complicated by rib pain from met--XRT Dr. Isidore Moos Second opinion [facility] 03/15/2019-then went to [place] recommended to continue management--supposed to follow-up with Dr. Bonney Aid after getting NGS/guardant 360 Blood clot tip Port-A-Cath on Coumadin   A & Plan Stage IV neuroendocrine cancer lung stage IV Rib pain status post XRT Pancytopenia likely related to chemo Cancer related cachexia with BMI 22 Received 5 cycles of therapy-getting second opinion at [facility] ? NPLATE/GRANIX per Dr. Earlie Server She had an appointment at [facility] which has been canceled for 6/4  Hemoglobin near trigger-transfusing 1 unit of PRBC 6/4 Continue gabapentin 300 daily as needed neuropathy, oxycodone 5-10 every 4 as needed pain Nonocclusive thrombus at the tip of Port-A-Cath Discussed with Dr. Earlie Server 04/17/2019-would hold Coumadin until platelets are above 50 Hypomagnesemia, hypokalemia likely secondary to diarrhea Magnesium now acceptable as is K Imodium 2 mg as needed Dysphagia-follow-up with SLP  Cleared by speech therapy Diarrhea Likely chemotherapy related and resolved with Imodium  DVT SCDs code Status: At this time full code communication: Patient alone disposition Plan: Not ready for discharge getting blood transfusion   [name], MD  [facility] [address] [place] [email or web address] 7PM-7AM contact night coverage as above 04/18/2019, 10:14 AM  LOS: 2 days   Consultants:  Oncologist  Procedures:  None  Antimicrobials:  None  Interval history/Subjective:  Awake alert coherent no distress Eating and drinking but seems a little bit more listless than yesterday looks slightly more flat than prior  Objective:  Vitals:   Vitals:   04/17/19 1955 04/18/19 0428  BP: (!) 110/59 102/63  Pulse: 87 91  Resp: 18 16  Temp: 98.8 F (37.1 C) 98.3 F (36.8 C)  SpO2: 98% 95%    Exam:  No significant changes to exam  EOMI NCAT pleasant S1-S2 no murmur rub or gallop Abdomen soft no rebound no guarding Chest clear Neck soft supple no thyromegaly no submandibular lymphadenopathy Neurologically intact no focal deficit Skin intact   I have personally reviewed the following:  DATA   Labs:  Platelets up to 30-->38, WBC 1.9-->2.1  Hemoglobin down from 7.4-7.1  Magnesium corrected to 2.1  Potassium corrected to 3.8  Scheduled Meds: . sodium chloride   Intravenous Once  . acetaminophen  650 mg Oral Once  . diphenhydrAMINE  25 mg Oral Once  . folic acid  1 mg Oral QPM  . furosemide  20 mg Intravenous Once  . Melatonin  10 mg Oral QHS  . potassium chloride  40 mEq Oral Daily   Continuous Infusions: . sodium chloride 50 mL/hr at 04/18/19 0605    Active Problems:   Hyperlipidemia   Large cell carcinoma of left lung, stage 4 (HCC)   Neutropenia (HCC)   Pancytopenia ([place])   LOS: 2 days

## 2019-04-18 NOTE — Progress Notes (Signed)
Pt was ordered kdur 10 meq tabs(4). She tool one and immediately vomited up her lunch and 240cc fluids and the tablet . I spoke with Dr.Samtani. He is aware

## 2019-04-19 ENCOUNTER — Other Ambulatory Visit: Payer: Self-pay | Admitting: *Deleted

## 2019-04-19 DIAGNOSIS — D649 Anemia, unspecified: Secondary | ICD-10-CM

## 2019-04-19 DIAGNOSIS — C7A8 Other malignant neuroendocrine tumors: Secondary | ICD-10-CM

## 2019-04-19 LAB — COMPREHENSIVE METABOLIC PANEL
ALT: 25 U/L (ref 0–44)
AST: 32 U/L (ref 15–41)
Albumin: 2.7 g/dL — ABNORMAL LOW (ref 3.5–5.0)
Alkaline Phosphatase: 92 U/L (ref 38–126)
Anion gap: 6 (ref 5–15)
BUN: 10 mg/dL (ref 8–23)
CO2: 24 mmol/L (ref 22–32)
Calcium: 8 mg/dL — ABNORMAL LOW (ref 8.9–10.3)
Chloride: 107 mmol/L (ref 98–111)
Creatinine, Ser: 0.84 mg/dL (ref 0.44–1.00)
GFR calc Af Amer: >60 mL/min (ref >60)
GFR calc non Af Amer: >60 mL/min (ref >60)
Glucose, Bld: 86 mg/dL (ref 70–99)
Potassium: 3.3 mmol/L — ABNORMAL LOW (ref 3.5–5.1)
Sodium: 137 mmol/L (ref 135–145)
Total Bilirubin: 0.6 mg/dL (ref 0.3–1.2)
Total Protein: 6 g/dL — ABNORMAL LOW (ref 6.5–8.1)

## 2019-04-19 LAB — CBC WITH DIFFERENTIAL/PLATELET
Abs Immature Granulocytes: 0.04 10*3/uL (ref 0.00–0.07)
Basophils Absolute: 0 10*3/uL (ref 0.0–0.1)
Basophils Relative: 0 %
Eosinophils Absolute: 0.1 10*3/uL (ref 0.0–0.5)
Eosinophils Relative: 4 %
HCT: 28.3 % — ABNORMAL LOW (ref 36.0–46.0)
Hemoglobin: 9.4 g/dL — ABNORMAL LOW (ref 12.0–15.0)
Immature Granulocytes: 2 %
Lymphocytes Relative: 31 %
Lymphs Abs: 0.8 10*3/uL (ref 0.7–4.0)
MCH: 32.5 pg (ref 26.0–34.0)
MCHC: 33.2 g/dL (ref 30.0–36.0)
MCV: 97.9 fL (ref 80.0–100.0)
Monocytes Absolute: 0.8 10*3/uL (ref 0.1–1.0)
Monocytes Relative: 32 %
Neutro Abs: 0.8 10*3/uL — ABNORMAL LOW (ref 1.7–7.7)
Neutrophils Relative %: 31 %
Platelets: 52 10*3/uL — ABNORMAL LOW (ref 150–400)
RBC: 2.89 MIL/uL — ABNORMAL LOW (ref 3.87–5.11)
RDW: 20.4 % — ABNORMAL HIGH (ref 11.5–15.5)
WBC: 2.4 10*3/uL — ABNORMAL LOW (ref 4.0–10.5)
nRBC: 0 % (ref 0.0–0.2)

## 2019-04-19 LAB — BPAM RBC
Blood Product Expiration Date: 202006252359
Blood Product Expiration Date: 202006302359
ISSUE DATE / TIME: 202006011354
ISSUE DATE / TIME: 202006041130
Unit Type and Rh: 5100
Unit Type and Rh: 5100

## 2019-04-19 LAB — TYPE AND SCREEN
ABO/RH(D): O POS
Antibody Screen: NEGATIVE
Unit division: 0
Unit division: 0

## 2019-04-19 LAB — MAGNESIUM: Magnesium: 1.6 mg/dL — ABNORMAL LOW (ref 1.7–2.4)

## 2019-04-19 MED ORDER — HYDROXYZINE HCL 25 MG PO TABS: 25.0000 mg | ORAL_TABLET | Freq: Three times a day (TID) | ORAL | 0 refills | Status: AC | PRN

## 2019-04-19 MED ORDER — GERHARDT'S BUTT CREAM: 1.0000 "application " | TOPICAL_CREAM | Freq: Every day | CUTANEOUS | 0 refills | Status: AC

## 2019-04-19 MED ORDER — MAGNESIUM OXIDE 400 (241.3 MG) MG PO TABS
400.0000 mg | ORAL_TABLET | Freq: Two times a day (BID) | ORAL | Status: DC
  Administered 2019-04-19: 400 mg via ORAL
  Filled 2019-04-19: qty 1

## 2019-04-19 MED ORDER — HEPARIN SOD (PORK) LOCK FLUSH 100 UNIT/ML IV SOLN
500.0000 [IU] | INTRAVENOUS | Status: AC | PRN
  Administered 2019-04-19: 500 [IU]

## 2019-04-19 MED ORDER — MAGNESIUM OXIDE 400 (241.3 MG) MG PO TABS: 400.0000 mg | ORAL_TABLET | Freq: Two times a day (BID) | ORAL | 0 refills | Status: DC

## 2019-04-19 MED ORDER — POLYETHYLENE GLYCOL 3350 17 G PO PACK: 17.0000 g | PACK | Freq: Every day | ORAL | 0 refills | Status: DC | PRN

## 2019-04-19 MED ORDER — LOPERAMIDE HCL 2 MG PO CAPS: 2.0000 mg | ORAL_CAPSULE | ORAL | 0 refills | Status: DC | PRN

## 2019-04-19 MED ORDER — POTASSIUM CHLORIDE ER 10 MEQ PO TBCR: 10.0000 meq | EXTENDED_RELEASE_TABLET | Freq: Two times a day (BID) | ORAL | 0 refills | Status: DC

## 2019-04-19 NOTE — Discharge Summary (Signed)
Physician Discharge Summary  Alison Sullivan XKG:[id number] DOB: 1951/11/06 DOA: 04/15/2019  PCP: [name], MD  Admit date: 04/15/2019 Discharge date: 04/19/2019  Time spent: 40 minutes  Recommendations for Outpatient Follow-up:  1. Needs INR 4 days, basic metabolic panel with potassium and magnesium in 4 days 2. Further management as per radiation oncologist and Dr. Earlie Server regarding planning 3. Check platelet counts to determine if she still a candidate for Coumadin-my discussion with Dr. Earlie Server was no need for placement of port at this time may need to restudy of her port  Discharge Diagnoses:  Active Problems:   Hyperlipidemia   Large cell carcinoma of left lung, stage 4 (HCC)   Neutropenia (HCC)   Pancytopenia (Alison Sullivan)   Discharge Condition: Improved  Diet recommendation: High potassium heart healthy  Filed Weights   04/15/19 0840  Weight: 62.6 kg    History of present illness:  68 year-old Caucasian female Stage IV T1b, N2, M1 C neuroendocrine CA since 70/2637-[name]-complicated by rib pain from met--XRT Dr. Isidore Moos Second opinion [facility] 03/15/2019-then went to [place] recommended to continue management--supposed to follow-up with Dr. Bonney Aid after getting NGS/guardant 360 Blood clot tip Port-A-Cath on Coumadin  Admitted with difficulty swallowing and trouble keeping up with her fluid losses with multiple stools in addition Treated with Imodium, given IV saline and electrolyte replacement  Hospital Course:  Stage IV neuroendocrine cancer lung stage IV Rib pain status post XRT Pancytopenia likely related to chemo Cancer related cachexia with BMI 22 Received 5 cycles of therapy-getting second opinion at [facility] ? NPLATE/GRANIX per Dr. Earlie Server She had an appointment at [facility] which has been canceled for 6/4  Transfused for hemoglobin of 7 PRBC 6/4-up to 9 on discharge Continue gabapentin 300 daily as needed neuropathy,  oxycodone 5-10 every 4 as needed pain Nonocclusive thrombus at the tip of Port-A-Cath Discussed with Dr. Earlie Server 04/17/2019-continuing Coumadin on discharge as platelets above 50-needs close INR check Hypomagnesemia, hypokalemia likely secondary to diarrhea Magnesium still slightly low replacing Cannot tolerate p.o. potassium large pills so given 10 mg twice daily and eat high amounts of oral potassium in the form of bananas and sweet potato-this may be a recurring problem Imodium 2 mg as needed ordered this admission Dysphagia-follow-up with [facility]             Cleared by speech therapy-they recommend slow eating with small sips of liquid and pills with liquids Diarrhea Likely chemotherapy related and resolved with Imodium Consultations:  I did telephone consult Dr. Earlie Server  Discharge Exam: Vitals:   04/18/19 2007 04/19/19 0509  BP: 109/67 104/65  Pulse: 84 96  Resp: 16 18  Temp: 98.2 F (36.8 C) 98 F (36.7 C)  SpO2: 95% 94%    General: Alert pleasant oriented no distress Cardiovascular: S1-S2 no murmur rub or gallop Respiratory: Clinically clear no added sound No lower extremity edema Abdomen soft No rebound No guarding  Discharge Instructions   Discharge Instructions    Diet - low sodium heart healthy   Complete by:  As directed    Discharge instructions   Complete by:  As directed    Please resume your Coumadin Get your INR checked in 4 days Eat plenty of potassium containing foods as you are not able to tolerate potassium replacement Need other labs in about a week this can be done at either primary MD office or at oncology office Oncology will be made aware of your discharge and should call  you for follow-up visit   Increase activity slowly   Complete by:  As directed      Allergies as of 04/19/2019      Reactions   Barium Sulfate Diarrhea   Hypersensitivity intolerance reaction of stomach cramps and diarrhea.      Medication List    STOP taking these  medications   Potassium Chloride 40 MEQ/15ML (20%) Soln Replaced by:  potassium chloride 10 MEQ tablet   simvastatin 20 MG tablet Commonly known as:  ZOCOR     TAKE these medications   folic acid 1 MG tablet Commonly known as:  FOLVITE Take 1 tablet (1 mg total) by mouth daily. What changed:  when to take this   gabapentin 300 MG capsule Commonly known as:  NEURONTIN Take 300 mg by mouth daily as needed (pain). What changed:  Another medication with the same name was removed. Continue taking this medication, and follow the directions you see here.   Gerhardt's butt cream Crea Apply 1 application topically daily.   hydrOXYzine 25 MG tablet Commonly known as:  ATARAX/VISTARIL Take 1 tablet (25 mg total) by mouth 3 (three) times daily as needed for nausea (use Zofran 1st).   lidocaine-prilocaine cream Commonly known as:  EMLA Apply 1 application topically as needed. What changed:  reasons to take this   loperamide 2 MG capsule Commonly known as:  IMODIUM Take 1 capsule (2 mg total) by mouth as needed for diarrhea or loose stools.   magic mouthwash Soln Take 5 mLs by mouth 4 (four) times daily as needed for mouth pain. Components - Hydrocortisone 60 mg, Nystatin suspension 30 ml , Benadryl 12.43m /5 ml QS 240 ml.   magnesium oxide 400 (241.3 Mg) MG tablet Commonly known as:  MAG-OX Take 1 tablet (400 mg total) by mouth 2 (two) times daily.   Melatonin 10 MG Tabs Take 10 mg by mouth at bedtime.   omeprazole 40 MG capsule Commonly known as:  PRILOSEC Take 1 capsule (40 mg total) by mouth daily.   oxyCODONE 5 MG immediate release tablet Commonly known as:  Oxy IR/ROXICODONE Take 1-2 tablets (5-10 mg total) by mouth every 4 (four) hours as needed for severe pain.   polyethylene glycol 17 g packet Commonly known as:  MIRALAX / GLYCOLAX Take 17 g by mouth daily as needed for mild constipation.   potassium chloride 10 MEQ tablet Commonly known as:  K-DUR Take 1 tablet  (10 mEq total) by mouth 2 (two) times daily. Replaces:  Potassium Chloride 40 MEQ/15ML (20%) Soln   prochlorperazine 10 MG tablet Commonly known as:  COMPAZINE Take 1 tablet (10 mg total) by mouth every 6 (six) hours as needed for nausea or vomiting.   warfarin 5 MG tablet Commonly known as:  COUMADIN TAKE 1 TABLET(5 MG) BY MOUTH DAILY What changed:  See the new instructions.      Allergies  Allergen Reactions  . Barium Sulfate Diarrhea    Hypersensitivity intolerance reaction of stomach cramps and diarrhea.      The results of significant diagnostics from this hospitalization (including imaging, microbiology, ancillary and laboratory) are listed below for reference.    Significant Diagnostic Studies: Dg Ribs Unilateral Left  Result Date: 03/27/2019 CLINICAL DATA:  Neuroendocrine tumor left lung stage IV. Left rib pain EXAM: LEFT RIBS - 2 VIEW COMPARISON:  CT chest 03/04/2019 FINDINGS: Left upper lobe nodule appears improved compared to the prior study. Negative for acute infiltrate effusion or pneumothorax on the left.  Apically pleural scarring. Port-A-Cath tip SVC Negative for left rib fracture or rib lesion. IMPRESSION: No acute abnormality.  No rib fracture or lesion on the left. Left upper lobe nodule appears smaller compared with chest CT of 03/04/2019 Electronically Signed   By: Franchot Gallo M.D.   On: 03/27/2019 15:18   Ct Chest W Contrast  Result Date: 04/11/2019 CLINICAL DATA:  Non-small cell lung cancer restaging EXAM: CT CHEST, ABDOMEN, AND PELVIS WITH CONTRAST TECHNIQUE: Multidetector CT imaging of the chest, abdomen and pelvis was performed following the standard protocol during bolus administration of intravenous contrast. CONTRAST:  135m OMNIPAQUE IOHEXOL 300 MG/ML  SOLN COMPARISON:  Multiple exams, including CT a chest from 03/04/2019 and CT abdomen from 02/26/2019 FINDINGS: CT CHEST FINDINGS Cardiovascular: Right Port-A-Cath tip: SVC. Thoracic aortic  atherosclerotic vascular calcification. Mediastinum/Nodes: Left periaortic tumor measures 1.8 by 4.1 cm, formerly 1.8 by 3.8 cm. A necrotic pericardial mass abutting the pericardial apex measures 2.6 by 3.5 cm, formerly 2.8 by 3.1 cm by my measurements. Lungs/Pleura: Bandlike and nodular density at the right lung apex is not appreciably changed, and measures about 1.3 cm in thickness on image 33/7, formerly the same by my measurements. There is some confluent sub solid opacity in the superior segment right lower lobe which is bandlike and stable. Left apically scarring is stable. A left upper lobe pulmonary nodule measures 1.9 by 1.1 cm on image 64/7, formerly 1.8 by 1.2 cm. New small left pleural effusion, nonspecific for transudative or exudative etiology. Musculoskeletal: Ill-defined sclerosis in the right posterior T10 vertebral body, extending into the right pedicle, similar to the prior exam. Irregular sclerosis and periostitis of the left tenth rib probably with an associated pathologic fracture, and extensive surrounding enhancing soft tissue mass measuring up to 2.8 cm in thickness compatible with tumor. On 02/26/2019 this measured up to 2.2 cm in thickness. CT ABDOMEN PELVIS FINDINGS Hepatobiliary: Stable tiny hepatic hypodensities in the lateral segment left hepatic lobe and in segment 7 of the liver are technically too small to characterize although statistically likely to be small cysts or similar benign lesions, not appreciably changed from 02/26/2019. Gallbladder unremarkable. No significant biliary dilatation. Pancreas: Unremarkable Spleen: Unremarkable Adrenals/Urinary Tract: Scattered cysts throughout the kidneys are most cases technically too small to characterize and seem to have varying complexity. In particular the 2.0 by 1.9 cm lesion in the right mid to lower kidney on image 73/2 probably has internal septations and complex components. Scattered bilateral nonobstructive nephrolithiasis  including a dominant clustered calcification in the right kidney lower pole measuring up to 0.8 cm in long axis on image 77/2. There is circumferential accentuated enhancement and borderline wall thickening of the right renal collecting system, right kidney lower pole infundibulum, and right ureter extending down to the urinary bladder, without hydronephrosis or hydroureter. This is likely inflammatory given the length of involvement. Stomach/Bowel: Descending and sigmoid colon diverticulosis. Previous intramural abscess along the sigmoid colon is no longer well seen. Vascular/Lymphatic: Aortoiliac atherosclerotic vascular disease. Chronic mural thrombus in the lower abdominal aorta. Reproductive: 1.1 cm right adnexal cystic lesion, unchanged from previous. Otherwise unremarkable. Other: No supplemental non-categorized findings. Musculoskeletal: Unremarkable IMPRESSION: 1. Compared to CT examinations from last month, the left periaortic and left pericardial dominant tumors appear stable, and the bandlike nodular density at the right apex is likewise unchanged. Stable confluent sub solid opacity in the superior segment right lower lobe. Stable left upper lobe pulmonary nodule. 2. The intramural abscess along the sigmoid colon is no  longer visible. Considerable descending and sigmoid colon diverticulosis. 3. New small left pleural effusion, nonspecific for transudative or exudative etiology. 4. Increase in soft tissue tumor surrounding the metastatic lesion in the left tenth rib. Stable ill-defined sclerosis in the right posterior T10 vertebral body extending into the right pedicle, favoring malignancy. 5. Scattered cystic lesions of varying complexity throughout both kidneys. Cystic neoplasm difficult to exclude in some locations, many of these lesions are technically too small to characterize. Surveillance is likely warranted. 6. New circumferential wall thickening in the right renal collecting system and right  ureter as well as a right kidney lower pole infundibulum. This is most likely inflammatory given the long segment of involvement, and could be from a recent stone passage. There is also bilateral nonobstructive nephrolithiasis. 7. Other imaging findings of potential clinical significance: Aortic Atherosclerosis (ICD10-I70.0). Stable tiny hepatic hypodensities are likely benign. Electronically Signed   By: Van Clines M.D.   On: 04/11/2019 11:08   Mr Thoracic Spine W Wo Contrast  Result Date: 03/25/2019 CLINICAL DATA:  Stage IV neuroendocrine lung carcinoma. Staging. Status post radiation to T10 lesion and adjacent ribs EXAM: MRI THORACIC AND LUMBAR SPINE WITHOUT AND WITH CONTRAST TECHNIQUE: Multiplanar and multiecho pulse sequences of the thoracic and lumbar spine were obtained without and with intravenous contrast. CONTRAST:  Gadavist 6 mL. COMPARISON:  Multiple priors. FINDINGS: MRI THORACIC SPINE FINDINGS Alignment:  Anatomic Vertebrae: T10 lesion is redemonstrated, RIGHT hemivertebra, with improved vertebral body component as well as improved pedicle and posterior element component. The most notable finding is lack of adjacent epidural tumor. LEFT-sided rib lesion, also improved, now 6 x 16 mm cross-section compared with previous 10 x 20 mm. No new deposits are identified. Cord:  Normal in signal and morphology. Paraspinal and other soft tissues: Adenopathy and pulmonary lesions are better demonstrated on CT. Unchanged soft tissue along the ventral aspect of the LEFT twelfth rib. Disc levels: No disc protrusion is evident. MRI LUMBAR SPINE FINDINGS Segmentation:  Standard. Alignment:  Physiologic Vertebrae: No visible lumbar metastatic disease. Fatty replaced marrow. Conus medullaris: Extends to the L1 level and appears normal. Paraspinal and other soft tissues: Aortic atherosclerosis. No definite adenopathy. Disc levels: No disc protrusion, spinal stenosis, or subarticular zone/foraminal zone  narrowing. Minor multilevel facet arthropathy. IMPRESSION: Improved RIGHT T10 vertebral body lesion, most notably with no adjacent epidural tumor on the RIGHT. Improved LEFT-sided rib lesion is measurably smaller in cross-section. No new thoracic lesions. Unremarkable lumbar spine. No visible metastatic disease, epidural tumor, or compressive spondylosis. Electronically Signed   By: Staci Righter M.D.   On: 03/25/2019 14:47   Mr Lumbar Spine W Wo Contrast  Result Date: 03/25/2019 CLINICAL DATA:  Stage IV neuroendocrine lung carcinoma. Staging. Status post radiation to T10 lesion and adjacent ribs EXAM: MRI THORACIC AND LUMBAR SPINE WITHOUT AND WITH CONTRAST TECHNIQUE: Multiplanar and multiecho pulse sequences of the thoracic and lumbar spine were obtained without and with intravenous contrast. CONTRAST:  Gadavist 6 mL. COMPARISON:  Multiple priors. FINDINGS: MRI THORACIC SPINE FINDINGS Alignment:  Anatomic Vertebrae: T10 lesion is redemonstrated, RIGHT hemivertebra, with improved vertebral body component as well as improved pedicle and posterior element component. The most notable finding is lack of adjacent epidural tumor. LEFT-sided rib lesion, also improved, now 6 x 16 mm cross-section compared with previous 10 x 20 mm. No new deposits are identified. Cord:  Normal in signal and morphology. Paraspinal and other soft tissues: Adenopathy and pulmonary lesions are better demonstrated on CT. Unchanged soft  tissue along the ventral aspect of the LEFT twelfth rib. Disc levels: No disc protrusion is evident. MRI LUMBAR SPINE FINDINGS Segmentation:  Standard. Alignment:  Physiologic Vertebrae: No visible lumbar metastatic disease. Fatty replaced marrow. Conus medullaris: Extends to the L1 level and appears normal. Paraspinal and other soft tissues: Aortic atherosclerosis. No definite adenopathy. Disc levels: No disc protrusion, spinal stenosis, or subarticular zone/foraminal zone narrowing. Minor multilevel facet  arthropathy. IMPRESSION: Improved RIGHT T10 vertebral body lesion, most notably with no adjacent epidural tumor on the RIGHT. Improved LEFT-sided rib lesion is measurably smaller in cross-section. No new thoracic lesions. Unremarkable lumbar spine. No visible metastatic disease, epidural tumor, or compressive spondylosis. Electronically Signed   By: Staci Righter M.D.   On: 03/25/2019 14:47   Ct Abdomen Pelvis W Contrast  Result Date: 04/11/2019 CLINICAL DATA:  Non-small cell lung cancer restaging EXAM: CT CHEST, ABDOMEN, AND PELVIS WITH CONTRAST TECHNIQUE: Multidetector CT imaging of the chest, abdomen and pelvis was performed following the standard protocol during bolus administration of intravenous contrast. CONTRAST:  167m OMNIPAQUE IOHEXOL 300 MG/ML  SOLN COMPARISON:  Multiple exams, including CT a chest from 03/04/2019 and CT abdomen from 02/26/2019 FINDINGS: CT CHEST FINDINGS Cardiovascular: Right Port-A-Cath tip: SVC. Thoracic aortic atherosclerotic vascular calcification. Mediastinum/Nodes: Left periaortic tumor measures 1.8 by 4.1 cm, formerly 1.8 by 3.8 cm. A necrotic pericardial mass abutting the pericardial apex measures 2.6 by 3.5 cm, formerly 2.8 by 3.1 cm by my measurements. Lungs/Pleura: Bandlike and nodular density at the right lung apex is not appreciably changed, and measures about 1.3 cm in thickness on image 33/7, formerly the same by my measurements. There is some confluent sub solid opacity in the superior segment right lower lobe which is bandlike and stable. Left apically scarring is stable. A left upper lobe pulmonary nodule measures 1.9 by 1.1 cm on image 64/7, formerly 1.8 by 1.2 cm. New small left pleural effusion, nonspecific for transudative or exudative etiology. Musculoskeletal: Ill-defined sclerosis in the right posterior T10 vertebral body, extending into the right pedicle, similar to the prior exam. Irregular sclerosis and periostitis of the left tenth rib probably with an  associated pathologic fracture, and extensive surrounding enhancing soft tissue mass measuring up to 2.8 cm in thickness compatible with tumor. On 02/26/2019 this measured up to 2.2 cm in thickness. CT ABDOMEN PELVIS FINDINGS Hepatobiliary: Stable tiny hepatic hypodensities in the lateral segment left hepatic lobe and in segment 7 of the liver are technically too small to characterize although statistically likely to be small cysts or similar benign lesions, not appreciably changed from 02/26/2019. Gallbladder unremarkable. No significant biliary dilatation. Pancreas: Unremarkable Spleen: Unremarkable Adrenals/Urinary Tract: Scattered cysts throughout the kidneys are most cases technically too small to characterize and seem to have varying complexity. In particular the 2.0 by 1.9 cm lesion in the right mid to lower kidney on image 73/2 probably has internal septations and complex components. Scattered bilateral nonobstructive nephrolithiasis including a dominant clustered calcification in the right kidney lower pole measuring up to 0.8 cm in long axis on image 77/2. There is circumferential accentuated enhancement and borderline wall thickening of the right renal collecting system, right kidney lower pole infundibulum, and right ureter extending down to the urinary bladder, without hydronephrosis or hydroureter. This is likely inflammatory given the length of involvement. Stomach/Bowel: Descending and sigmoid colon diverticulosis. Previous intramural abscess along the sigmoid colon is no longer well seen. Vascular/Lymphatic: Aortoiliac atherosclerotic vascular disease. Chronic mural thrombus in the lower abdominal aorta. Reproductive: 1.1  cm right adnexal cystic lesion, unchanged from previous. Otherwise unremarkable. Other: No supplemental non-categorized findings. Musculoskeletal: Unremarkable IMPRESSION: 1. Compared to CT examinations from last month, the left periaortic and left pericardial dominant tumors  appear stable, and the bandlike nodular density at the right apex is likewise unchanged. Stable confluent sub solid opacity in the superior segment right lower lobe. Stable left upper lobe pulmonary nodule. 2. The intramural abscess along the sigmoid colon is no longer visible. Considerable descending and sigmoid colon diverticulosis. 3. New small left pleural effusion, nonspecific for transudative or exudative etiology. 4. Increase in soft tissue tumor surrounding the metastatic lesion in the left tenth rib. Stable ill-defined sclerosis in the right posterior T10 vertebral body extending into the right pedicle, favoring malignancy. 5. Scattered cystic lesions of varying complexity throughout both kidneys. Cystic neoplasm difficult to exclude in some locations, many of these lesions are technically too small to characterize. Surveillance is likely warranted. 6. New circumferential wall thickening in the right renal collecting system and right ureter as well as a right kidney lower pole infundibulum. This is most likely inflammatory given the long segment of involvement, and could be from a recent stone passage. There is also bilateral nonobstructive nephrolithiasis. 7. Other imaging findings of potential clinical significance: Aortic Atherosclerosis (ICD10-I70.0). Stable tiny hepatic hypodensities are likely benign. Electronically Signed   By: Van Clines M.D.   On: 04/11/2019 11:08    Microbiology: Recent Results (from the past 240 hour(s))  SARS Coronavirus 2 (CEPHEID - Performed in [place] hospital lab), Hosp Order     Status: None   Collection Time: 04/15/19  3:02 PM  Result Value Ref Range Status   SARS Coronavirus 2 NEGATIVE NEGATIVE Final    Comment: (NOTE) If result is NEGATIVE SARS-CoV-2 target nucleic acids are NOT DETECTED. The SARS-CoV-2 RNA is generally detectable in upper and lower  respiratory specimens during the acute phase of infection. The lowest  concentration of  SARS-CoV-2 viral copies this assay can detect is 250  copies / mL. A negative result does not preclude SARS-CoV-2 infection  and should not be used as the sole basis for treatment or other  patient management decisions.  A negative result may occur with  improper specimen collection / handling, submission of specimen other  than nasopharyngeal swab, presence of viral mutation(s) within the  areas targeted by this assay, and inadequate number of viral copies  (<250 copies / mL). A negative result must be combined with clinical  observations, patient history, and epidemiological information. If result is POSITIVE SARS-CoV-2 target nucleic acids are DETECTED. The SARS-CoV-2 RNA is generally detectable in upper and lower  respiratory specimens dur ing the acute phase of infection.  Positive  results are indicative of active infection with SARS-CoV-2.  Clinical  correlation with patient history and other diagnostic information is  necessary to determine patient infection status.  Positive results do  not rule out bacterial infection or co-infection with other viruses. If result is PRESUMPTIVE POSTIVE SARS-CoV-2 nucleic acids MAY BE PRESENT.   A presumptive positive result was obtained on the submitted specimen  and confirmed on repeat testing.  While 2019 novel coronavirus  (SARS-CoV-2) nucleic acids may be present in the submitted sample  additional confirmatory testing may be necessary for epidemiological  and / or clinical management purposes  to differentiate between  SARS-CoV-2 and other Sarbecovirus currently known to infect humans.  If clinically indicated additional testing with an alternate test  methodology [phone]) is advised. The SARS-CoV-2  RNA is generally  detectable in upper and lower respiratory sp ecimens during the acute  phase of infection. The expected result is Negative. Fact Sheet for Patients:  StrictlyIdeas.no Fact Sheet for Healthcare  Providers: BankingDealers.co.za This test is not yet approved or cleared by the [place] FDA and has been authorized for detection and/or diagnosis of SARS-CoV-2 by FDA under an Emergency Use Authorization (EUA).  This EUA will remain in effect (meaning this test can be used) for the duration of the COVID-19 declaration under Section 564(b)(1) of the Act, 21 U.S.C. section 360bbb-3(b)(1), unless the authorization is terminated or revoked sooner. Performed at [facility], [place] [address], [place], [place] [address]   C difficile quick scan w PCR reflex     Status: None   Collection Time: 04/16/19  9:41 AM  Result Value Ref Range Status   C Diff antigen NEGATIVE NEGATIVE Final   C Diff toxin NEGATIVE NEGATIVE Final   C Diff interpretation No C. difficile detected.  Final    Comment: Performed at [facility], [place] [address], [place], [place] [address]     Labs: Basic Metabolic Panel: Recent Labs  Lab 04/15/19 0945 04/16/19 0653 04/17/19 0821 04/18/19 0346 04/19/19 0412  NA 137 140 137 137 137  K 2.6* 3.0* 3.3* 3.8 3.3*  CL 104 114* 111 111 107  CO2 23 21* 21* 21* 24  GLUCOSE 115* 91 123* 87 86  BUN _0 6* 10  CREATININE 0.84 0.70 0.71 0.67 0.84  CALCIUM 8.3* 7.7* 7.7* 7.7* 8.0*  MG 1.7  --  1.3* 2.1 1.6*   Liver Function Tests: Recent Labs  Lab 04/15/19 0945 04/16/19 0653 04/18/19 0346 04/19/19 0412  AST 78* 54* 35 32  ALT 52* 40 27 25  ALKPHOS 112 94 91 92  BILITOT 0.8 1.0 0.3 0.6  PROT 6.7 6.0* 5.7* 6.0*  ALBUMIN 2.9* 2.7* 2.4* 2.7*   No results for input(s): LIPASE, AMYLASE in the last 168 hours. No results for input(s): AMMONIA in the last 168 hours. CBC: Recent Labs  Lab 04/15/19 0945 04/16/19 0653 04/16/19 1120 04/17/19 0821 04/18/19 0346 04/19/19 0412  WBC 2.0* 2.0*  --  1.9* 2.1* 2.4*  NEUTROABS 0.9*  --   --  0.7* 0.7* 0.8*  HGB 6.4* 7.3*  --  7.4* 7.1* 9.4*  HCT 18.9* 22.4*  20.0* 22.9* 21.9* 28.3*  MCV 105.0* 103.2*  --  104.1* 104.8* 97.9  PLT 17* 20*  --  30* 38* 52*   Cardiac Enzymes: No results for input(s): CKTOTAL, CKMB, CKMBINDEX, TROPONINI in the last 168 hours. BNP: BNP (last 3 results) No results for input(s): BNP in the last 8760 hours.  ProBNP (last 3 results) No results for input(s): PROBNP in the last 8760 hours.  CBG: No results for input(s): GLUCAP in the last 168 hours.     Signed:  Nita Sells MD   [facility] 04/19/2019, 9:31 AM

## 2019-04-19 NOTE — Care Management Important Message (Signed)
Important Message  Patient Details IM Letter given to Servando Snare SW to present to the Patient Name: Alison Sullivan MRN: [id number] Date of Birth: 01-May-1951   Medicare Important Message Given:  Yes    Kerin Salen 04/19/2019, 10:05 AM

## 2019-04-22 ENCOUNTER — Other Ambulatory Visit: Payer: Self-pay

## 2019-04-22 ENCOUNTER — Inpatient Hospital Stay: Payer: Medicare Other | Attending: Internal Medicine

## 2019-04-22 ENCOUNTER — Inpatient Hospital Stay: Payer: Medicare Other

## 2019-04-22 ENCOUNTER — Other Ambulatory Visit: Payer: Self-pay | Admitting: Internal Medicine

## 2019-04-22 ENCOUNTER — Inpatient Hospital Stay (HOSPITAL_BASED_OUTPATIENT_CLINIC_OR_DEPARTMENT_OTHER): Payer: Medicare Other | Admitting: Internal Medicine

## 2019-04-22 ENCOUNTER — Telehealth: Payer: Self-pay | Admitting: *Deleted

## 2019-04-22 ENCOUNTER — Encounter: Payer: Self-pay | Admitting: Internal Medicine

## 2019-04-22 VITALS — BP 106/74 | HR 101 | Temp 99.1°F | Resp 18 | Ht 66.0 in | Wt 134.7 lb

## 2019-04-22 DIAGNOSIS — J029 Acute pharyngitis, unspecified: Secondary | ICD-10-CM | POA: Diagnosis not present

## 2019-04-22 DIAGNOSIS — K5730 Diverticulosis of large intestine without perforation or abscess without bleeding: Secondary | ICD-10-CM | POA: Diagnosis not present

## 2019-04-22 DIAGNOSIS — I513 Intracardiac thrombosis, not elsewhere classified: Secondary | ICD-10-CM | POA: Diagnosis not present

## 2019-04-22 DIAGNOSIS — Z5112 Encounter for antineoplastic immunotherapy: Secondary | ICD-10-CM

## 2019-04-22 DIAGNOSIS — E46 Unspecified protein-calorie malnutrition: Secondary | ICD-10-CM | POA: Diagnosis not present

## 2019-04-22 DIAGNOSIS — N200 Calculus of kidney: Secondary | ICD-10-CM | POA: Diagnosis not present

## 2019-04-22 DIAGNOSIS — D61818 Other pancytopenia: Secondary | ICD-10-CM | POA: Insufficient documentation

## 2019-04-22 DIAGNOSIS — Z923 Personal history of irradiation: Secondary | ICD-10-CM | POA: Insufficient documentation

## 2019-04-22 DIAGNOSIS — I700 Atherosclerosis of aorta: Secondary | ICD-10-CM | POA: Insufficient documentation

## 2019-04-22 DIAGNOSIS — M1990 Unspecified osteoarthritis, unspecified site: Secondary | ICD-10-CM | POA: Diagnosis not present

## 2019-04-22 DIAGNOSIS — C7951 Secondary malignant neoplasm of bone: Secondary | ICD-10-CM | POA: Insufficient documentation

## 2019-04-22 DIAGNOSIS — R5382 Chronic fatigue, unspecified: Secondary | ICD-10-CM

## 2019-04-22 DIAGNOSIS — C7A1 Malignant poorly differentiated neuroendocrine tumors: Secondary | ICD-10-CM | POA: Diagnosis not present

## 2019-04-22 DIAGNOSIS — Z7901 Long term (current) use of anticoagulants: Secondary | ICD-10-CM

## 2019-04-22 DIAGNOSIS — Z87442 Personal history of urinary calculi: Secondary | ICD-10-CM

## 2019-04-22 DIAGNOSIS — C3412 Malignant neoplasm of upper lobe, left bronchus or lung: Secondary | ICD-10-CM | POA: Insufficient documentation

## 2019-04-22 DIAGNOSIS — R0609 Other forms of dyspnea: Secondary | ICD-10-CM | POA: Insufficient documentation

## 2019-04-22 DIAGNOSIS — Z79899 Other long term (current) drug therapy: Secondary | ICD-10-CM | POA: Insufficient documentation

## 2019-04-22 DIAGNOSIS — C3492 Malignant neoplasm of unspecified part of left bronchus or lung: Secondary | ICD-10-CM

## 2019-04-22 DIAGNOSIS — E860 Dehydration: Secondary | ICD-10-CM

## 2019-04-22 DIAGNOSIS — F419 Anxiety disorder, unspecified: Secondary | ICD-10-CM | POA: Insufficient documentation

## 2019-04-22 DIAGNOSIS — J90 Pleural effusion, not elsewhere classified: Secondary | ICD-10-CM

## 2019-04-22 DIAGNOSIS — E876 Hypokalemia: Secondary | ICD-10-CM | POA: Insufficient documentation

## 2019-04-22 DIAGNOSIS — M8580 Other specified disorders of bone density and structure, unspecified site: Secondary | ICD-10-CM | POA: Insufficient documentation

## 2019-04-22 DIAGNOSIS — M546 Pain in thoracic spine: Secondary | ICD-10-CM | POA: Diagnosis not present

## 2019-04-22 DIAGNOSIS — R5383 Other fatigue: Secondary | ICD-10-CM | POA: Diagnosis not present

## 2019-04-22 DIAGNOSIS — T85818D Embolism due to other internal prosthetic devices, implants and grafts, subsequent encounter: Secondary | ICD-10-CM

## 2019-04-22 DIAGNOSIS — C7A8 Other malignant neuroendocrine tumors: Secondary | ICD-10-CM

## 2019-04-22 LAB — CBC WITH DIFFERENTIAL (CANCER CENTER ONLY)
Abs Immature Granulocytes: 0.04 10*3/uL (ref 0.00–0.07)
Basophils Absolute: 0 10*3/uL (ref 0.0–0.1)
Basophils Relative: 0 %
Eosinophils Absolute: 0.1 10*3/uL (ref 0.0–0.5)
Eosinophils Relative: 3 %
HCT: 31.6 % — ABNORMAL LOW (ref 36.0–46.0)
Hemoglobin: 10.2 g/dL — ABNORMAL LOW (ref 12.0–15.0)
Immature Granulocytes: 1 %
Lymphocytes Relative: 23 %
Lymphs Abs: 0.9 10*3/uL (ref 0.7–4.0)
MCH: 32.1 pg (ref 26.0–34.0)
MCHC: 32.3 g/dL (ref 30.0–36.0)
MCV: 99.4 fL (ref 80.0–100.0)
Monocytes Absolute: 1.1 10*3/uL — ABNORMAL HIGH (ref 0.1–1.0)
Monocytes Relative: 30 %
Neutro Abs: 1.6 10*3/uL — ABNORMAL LOW (ref 1.7–7.7)
Neutrophils Relative %: 43 %
Platelet Count: 116 10*3/uL — ABNORMAL LOW (ref 150–400)
RBC: 3.18 MIL/uL — ABNORMAL LOW (ref 3.87–5.11)
RDW: 20.1 % — ABNORMAL HIGH (ref 11.5–15.5)
WBC Count: 3.7 10*3/uL — ABNORMAL LOW (ref 4.0–10.5)
nRBC: 0 % (ref 0.0–0.2)

## 2019-04-22 LAB — CMP (CANCER CENTER ONLY)
ALT: 33 U/L (ref 0–44)
AST: 61 U/L — ABNORMAL HIGH (ref 15–41)
Albumin: 2.9 g/dL — ABNORMAL LOW (ref 3.5–5.0)
Alkaline Phosphatase: 128 U/L — ABNORMAL HIGH (ref 38–126)
Anion gap: 12 (ref 5–15)
BUN: 12 mg/dL (ref 8–23)
CO2: 22 mmol/L (ref 22–32)
Calcium: 8.5 mg/dL — ABNORMAL LOW (ref 8.9–10.3)
Chloride: 104 mmol/L (ref 98–111)
Creatinine: 0.91 mg/dL (ref 0.44–1.00)
GFR, Est AFR Am: >60 mL/min (ref >60)
GFR, Estimated: >60 mL/min (ref >60)
Glucose, Bld: 122 mg/dL — ABNORMAL HIGH (ref 70–99)
Potassium: 3.5 mmol/L (ref 3.5–5.1)
Sodium: 138 mmol/L (ref 135–145)
Total Bilirubin: 0.6 mg/dL (ref 0.3–1.2)
Total Protein: 6.9 g/dL (ref 6.5–8.1)

## 2019-04-22 LAB — MAGNESIUM: Magnesium: 1.4 mg/dL — CL (ref 1.7–2.4)

## 2019-04-22 LAB — PROTIME-INR
INR: 1 (ref 0.8–1.2)
Prothrombin Time: 13.5 seconds (ref 11.4–15.2)

## 2019-04-22 LAB — TSH: TSH: 0.942 u[IU]/mL (ref 0.308–3.960)

## 2019-04-22 MED ORDER — HEPARIN SOD (PORK) LOCK FLUSH 100 UNIT/ML IV SOLN
500.0000 [IU] | Freq: Once | INTRAVENOUS | Status: AC | PRN
  Administered 2019-04-22: 500 [IU]
  Filled 2019-04-22: qty 5

## 2019-04-22 MED ORDER — SODIUM CHLORIDE 0.9 % IV SOLN
Freq: Once | INTRAVENOUS | Status: AC
  Administered 2019-04-22: 10:00:00 via INTRAVENOUS
  Filled 2019-04-22: qty 250

## 2019-04-22 MED ORDER — SODIUM CHLORIDE 0.9% FLUSH
10.0000 mL | INTRAVENOUS | Status: DC | PRN
  Administered 2019-04-22: 10 mL
  Filled 2019-04-22: qty 10

## 2019-04-22 MED ORDER — ALPRAZOLAM 0.25 MG PO TABS: 0.2500 mg | ORAL_TABLET | Freq: Two times a day (BID) | ORAL | 0 refills | Status: DC | PRN

## 2019-04-22 MED ORDER — SODIUM CHLORIDE 0.9 % IV SOLN
200.0000 mg | Freq: Once | INTRAVENOUS | Status: AC
  Administered 2019-04-22: 200 mg via INTRAVENOUS
  Filled 2019-04-22: qty 8

## 2019-04-22 NOTE — Telephone Encounter (Signed)
Received call report from [facility].  "Today's Mg+ = 1.4."  Secure Chat sent with results, confirmed seen.

## 2019-04-22 NOTE — Progress Notes (Signed)
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  She presented with left upper lobe lung nodule in addition to mediastinal lymphadenopathy as well as right upper lobe as well as metastatic bone disease to the sixth and 10th rib.  The patient has significant pain from her metastatic rib lesions.  PRIOR THERAPY:  1) systemic chemotherapy with carboplatin for AUC of 5 on day 1, etoposide 100 mg/M2 on days 1, 2 and 3 as well as Tecentriq 1200 mg IV every 3 weeks with Neulasta support.  Status post 2 cycles, discontinued secondary to disease progression. 2) palliative radiotherapy to progressive T10 vertebral body bone lesion under the care of Dr. Isidore Moos .  CURRENT THERAPY: Systemic chemotherapy with carboplatin for AUC of 5, Alimta 500 mg/M2 and Keytruda 200 mg IV every 3 weeks.  First dose December 17, 2018.  Status post 6 cycles.  Starting cycle #5 the patient is currently on maintenance treatment with Alimta and Keytruda.  INTERVAL HISTORY: Alison Sullivan 68 y.o. female returns to the clinic today for follow-up visit.  Her son was available by phone during the visit.  The patient continues to complain of increasing fatigue and weakness as well as pain on the left side of the mid back.  She also has itching with her treatment.  She has sore throat treated with Magic mouthwash.  She denied having any current chest pain, shortness of breath, cough or hemoptysis.  She has gagging when she takes her medication and sometimes followed by vomiting.  She denied having any current fever or chills.  She has no current nausea, vomiting, diarrhea or constipation.  She was admitted recently to [facility] with pancytopenia as well as significant  fatigue and weakness and dehydration.  The patient had repeat CT scan of the chest, abdomen pelvis performed recently.  She is here for evaluation and discussion of her scan results.   MEDICAL HISTORY: Past Medical History:  Diagnosis Date   Arthritis    Chronic kidney disease    Dog bite of wrist    History of kidney stones    History of radiation therapy 10/23/18- 11/06/18   Left lower rib mass/ 30 Gy delivered in 10 fractions 3 Gy.    Kidney infection    Left renal mass    Osteopenia    Pyelonephritis    Recurrent UTI (urinary tract infection)     ALLERGIES:  is allergic to barium sulfate.  MEDICATIONS:  Current Outpatient Medications  Medication Sig Dispense Refill   folic acid (FOLVITE) 1 MG tablet Take 1 tablet (1 mg total) by mouth daily. (Patient taking differently: Take 1 mg by mouth every evening. ) 30 tablet 4   gabapentin (NEURONTIN) 300 MG capsule Take 300 mg by mouth daily as needed (pain).     Hydrocortisone (GERHARDT'S BUTT CREAM) CREA Apply 1 application topically daily. 1 each 0   hydrOXYzine (ATARAX/VISTARIL) 25 MG tablet Take 1 tablet (25 mg total) by mouth 3 (three) times daily as needed for nausea (use Zofran 1st). 30 tablet 0   lidocaine-prilocaine (EMLA) cream Apply 1 application topically as needed. (Patient taking differently: Apply 1 application topically as needed (For port-a-cath.). ) 30 g 0   loperamide (IMODIUM) 2 MG capsule Take  1 capsule (2 mg total) by mouth as needed for diarrhea or loose stools. 30 capsule 0   magic mouthwash SOLN Take 5 mLs by mouth 4 (four) times daily as needed for mouth pain. Components - Hydrocortisone 60 mg, Nystatin suspension 30 ml , Benadryl 12.5mg  /5 ml QS 240 ml. 240 mL 1   magnesium oxide (MAG-OX) 400 (241.3 Mg) MG tablet Take 1 tablet (400 mg total) by mouth 2 (two) times daily. 20 tablet 0   Melatonin 10 MG TABS Take 10 mg by mouth at bedtime.      omeprazole (PRILOSEC) 40 MG capsule Take 1  capsule (40 mg total) by mouth daily. (Patient not taking: Reported on 04/15/2019) 30 capsule 2   oxyCODONE (OXY IR/ROXICODONE) 5 MG immediate release tablet Take 1-2 tablets (5-10 mg total) by mouth every 4 (four) hours as needed for severe pain. 120 tablet 0   polyethylene glycol (MIRALAX / GLYCOLAX) 17 g packet Take 17 g by mouth daily as needed for mild constipation. 14 each 0   potassium chloride (K-DUR) 10 MEQ tablet Take 1 tablet (10 mEq total) by mouth 2 (two) times daily. 20 tablet 0   prochlorperazine (COMPAZINE) 10 MG tablet Take 1 tablet (10 mg total) by mouth every 6 (six) hours as needed for nausea or vomiting. (Patient not taking: Reported on 04/01/2019) 30 tablet 0   warfarin (COUMADIN) 5 MG tablet TAKE 1 TABLET(5 MG) BY MOUTH DAILY (Patient taking differently: Take 2.5-5 mg by mouth every evening. Take 2.5mg  on Monday and Thursday and 5mg  the rest of the week.) 90 tablet 1   No current facility-administered medications for this visit.     SURGICAL HISTORY:  Past Surgical History:  Procedure Laterality Date   CYSTOSCOPY WITH RETROGRADE PYELOGRAM, URETEROSCOPY AND STENT PLACEMENT Left 12/28/2017   Procedure: CYSTOSCOPY WITH RETROGRADE PYELOGRAM, URETEROSCOPY AND STENT PLACEMENT, STONE BASKETRY;  Surgeon: Festus Aloe, MD;  Location: [facility];  Service: Urology;  Laterality: Left;   FINGER SURGERY     index, growth removal   HOLMIUM LASER APPLICATION Left 9/38/1017   Procedure: HOLMIUM LASER APPLICATION;  Surgeon: Festus Aloe, MD;  Location: [facility];  Service: Urology;  Laterality: Left;   IR IMAGING GUIDED PORT INSERTION  10/31/2018   KIDNEY STONE SURGERY  2011 or 2012   LITHOTRIPSY  2007    REVIEW OF SYSTEMS:  Constitutional: positive for fatigue Eyes: negative Ears, nose, mouth, throat, and face: negative Respiratory: positive for pleurisy/chest pain Cardiovascular: negative Gastrointestinal:  negative Genitourinary:negative Integument/breast: negative Hematologic/lymphatic: negative Musculoskeletal:negative Neurological: negative Behavioral/Psych: negative Endocrine: negative Allergic/Immunologic: negative   PHYSICAL EXAMINATION: General appearance: alert, cooperative, fatigued and no distress Head: Normocephalic, without obvious abnormality, atraumatic Neck: no adenopathy, no JVD, supple, symmetrical, trachea midline and thyroid not enlarged, symmetric, no tenderness/mass/nodules Lymph nodes: Cervical, supraclavicular, and axillary nodes normal. Resp: clear to auscultation bilaterally Back: symmetric, no curvature. ROM normal. No CVA tenderness. Cardio: regular rate and rhythm, S1, S2 normal, no murmur, click, rub or gallop GI: soft, non-tender; bowel sounds normal; no masses,  no organomegaly Extremities: extremities normal, atraumatic, no cyanosis or edema Neurologic: Alert and oriented X 3, normal strength and tone. Normal symmetric reflexes. Normal coordination and gait  ECOG PERFORMANCE STATUS: 1 - Symptomatic but completely ambulatory  Blood pressure 106/74, pulse (!) 101, temperature 99.1 F (37.3 C), temperature source Oral, resp. rate 18, height 5\' 6"  (1.676 m), weight 134 lb 11.2 oz (61.1 kg), SpO2 100 %.  LABORATORY DATA: Lab  Results  Component Value Date   WBC 3.7 (L) 04/22/2019   HGB 10.2 (L) 04/22/2019   HCT 31.6 (L) 04/22/2019   MCV 99.4 04/22/2019   PLT 116 (L) 04/22/2019      Chemistry      Component Value Date/Time   NA 137 04/19/2019 0412   K 3.3 (L) 04/19/2019 0412   CL 107 04/19/2019 0412   CO2 24 04/19/2019 0412   BUN 10 04/19/2019 0412   CREATININE 0.84 04/19/2019 0412   CREATININE 0.83 04/01/2019 0835      Component Value Date/Time   CALCIUM 8.0 (L) 04/19/2019 0412   ALKPHOS 92 04/19/2019 0412   AST 32 04/19/2019 0412   AST 72 (H) 04/01/2019 0835   ALT 25 04/19/2019 0412   ALT 57 (H) 04/01/2019 0835   BILITOT 0.6 04/19/2019  0412   BILITOT 0.4 04/01/2019 0835       RADIOGRAPHIC STUDIES: Dg Ribs Unilateral Left  Result Date: 03/27/2019 CLINICAL DATA:  Neuroendocrine tumor left lung stage IV. Left rib pain EXAM: LEFT RIBS - 2 VIEW COMPARISON:  CT chest 03/04/2019 FINDINGS: Left upper lobe nodule appears improved compared to the prior study. Negative for acute infiltrate effusion or pneumothorax on the left. Apically pleural scarring. Port-A-Cath tip SVC Negative for left rib fracture or rib lesion. IMPRESSION: No acute abnormality.  No rib fracture or lesion on the left. Left upper lobe nodule appears smaller compared with chest CT of 03/04/2019 Electronically Signed   By: Franchot Gallo M.D.   On: 03/27/2019 15:18   Ct Chest W Contrast  Result Date: 04/11/2019 CLINICAL DATA:  Non-small cell lung cancer restaging EXAM: CT CHEST, ABDOMEN, AND PELVIS WITH CONTRAST TECHNIQUE: Multidetector CT imaging of the chest, abdomen and pelvis was performed following the standard protocol during bolus administration of intravenous contrast. CONTRAST:  128mL OMNIPAQUE IOHEXOL 300 MG/ML  SOLN COMPARISON:  Multiple exams, including CT a chest from 03/04/2019 and CT abdomen from 02/26/2019 FINDINGS: CT CHEST FINDINGS Cardiovascular: Right Port-A-Cath tip: SVC. Thoracic aortic atherosclerotic vascular calcification. Mediastinum/Nodes: Left periaortic tumor measures 1.8 by 4.1 cm, formerly 1.8 by 3.8 cm. A necrotic pericardial mass abutting the pericardial apex measures 2.6 by 3.5 cm, formerly 2.8 by 3.1 cm by my measurements. Lungs/Pleura: Bandlike and nodular density at the right lung apex is not appreciably changed, and measures about 1.3 cm in thickness on image 33/7, formerly the same by my measurements. There is some confluent sub solid opacity in the superior segment right lower lobe which is bandlike and stable. Left apically scarring is stable. A left upper lobe pulmonary nodule measures 1.9 by 1.1 cm on image 64/7, formerly 1.8 by 1.2  cm. New small left pleural effusion, nonspecific for transudative or exudative etiology. Musculoskeletal: Ill-defined sclerosis in the right posterior T10 vertebral body, extending into the right pedicle, similar to the prior exam. Irregular sclerosis and periostitis of the left tenth rib probably with an associated pathologic fracture, and extensive surrounding enhancing soft tissue mass measuring up to 2.8 cm in thickness compatible with tumor. On 02/26/2019 this measured up to 2.2 cm in thickness. CT ABDOMEN PELVIS FINDINGS Hepatobiliary: Stable tiny hepatic hypodensities in the lateral segment left hepatic lobe and in segment 7 of the liver are technically too small to characterize although statistically likely to be small cysts or similar benign lesions, not appreciably changed from 02/26/2019. Gallbladder unremarkable. No significant biliary dilatation. Pancreas: Unremarkable Spleen: Unremarkable Adrenals/Urinary Tract: Scattered cysts throughout the kidneys are most cases technically too  small to characterize and seem to have varying complexity. In particular the 2.0 by 1.9 cm lesion in the right mid to lower kidney on image 73/2 probably has internal septations and complex components. Scattered bilateral nonobstructive nephrolithiasis including a dominant clustered calcification in the right kidney lower pole measuring up to 0.8 cm in long axis on image 77/2. There is circumferential accentuated enhancement and borderline wall thickening of the right renal collecting system, right kidney lower pole infundibulum, and right ureter extending down to the urinary bladder, without hydronephrosis or hydroureter. This is likely inflammatory given the length of involvement. Stomach/Bowel: Descending and sigmoid colon diverticulosis. Previous intramural abscess along the sigmoid colon is no longer well seen. Vascular/Lymphatic: Aortoiliac atherosclerotic vascular disease. Chronic mural thrombus in the lower abdominal  aorta. Reproductive: 1.1 cm right adnexal cystic lesion, unchanged from previous. Otherwise unremarkable. Other: No supplemental non-categorized findings. Musculoskeletal: Unremarkable IMPRESSION: 1. Compared to CT examinations from last month, the left periaortic and left pericardial dominant tumors appear stable, and the bandlike nodular density at the right apex is likewise unchanged. Stable confluent sub solid opacity in the superior segment right lower lobe. Stable left upper lobe pulmonary nodule. 2. The intramural abscess along the sigmoid colon is no longer visible. Considerable descending and sigmoid colon diverticulosis. 3. New small left pleural effusion, nonspecific for transudative or exudative etiology. 4. Increase in soft tissue tumor surrounding the metastatic lesion in the left tenth rib. Stable ill-defined sclerosis in the right posterior T10 vertebral body extending into the right pedicle, favoring malignancy. 5. Scattered cystic lesions of varying complexity throughout both kidneys. Cystic neoplasm difficult to exclude in some locations, many of these lesions are technically too small to characterize. Surveillance is likely warranted. 6. New circumferential wall thickening in the right renal collecting system and right ureter as well as a right kidney lower pole infundibulum. This is most likely inflammatory given the long segment of involvement, and could be from a recent stone passage. There is also bilateral nonobstructive nephrolithiasis. 7. Other imaging findings of potential clinical significance: Aortic Atherosclerosis (ICD10-I70.0). Stable tiny hepatic hypodensities are likely benign. Electronically Signed   By: Van Clines M.D.   On: 04/11/2019 11:08   Mr Thoracic Spine W Wo Contrast  Result Date: 03/25/2019 CLINICAL DATA:  Stage IV neuroendocrine lung carcinoma. Staging. Status post radiation to T10 lesion and adjacent ribs EXAM: MRI THORACIC AND LUMBAR SPINE WITHOUT AND WITH  CONTRAST TECHNIQUE: Multiplanar and multiecho pulse sequences of the thoracic and lumbar spine were obtained without and with intravenous contrast. CONTRAST:  Gadavist 6 mL. COMPARISON:  Multiple priors. FINDINGS: MRI THORACIC SPINE FINDINGS Alignment:  Anatomic Vertebrae: T10 lesion is redemonstrated, RIGHT hemivertebra, with improved vertebral body component as well as improved pedicle and posterior element component. The most notable finding is lack of adjacent epidural tumor. LEFT-sided rib lesion, also improved, now 6 x 16 mm cross-section compared with previous 10 x 20 mm. No new deposits are identified. Cord:  Normal in signal and morphology. Paraspinal and other soft tissues: Adenopathy and pulmonary lesions are better demonstrated on CT. Unchanged soft tissue along the ventral aspect of the LEFT twelfth rib. Disc levels: No disc protrusion is evident. MRI LUMBAR SPINE FINDINGS Segmentation:  Standard. Alignment:  Physiologic Vertebrae: No visible lumbar metastatic disease. Fatty replaced marrow. Conus medullaris: Extends to the L1 level and appears normal. Paraspinal and other soft tissues: Aortic atherosclerosis. No definite adenopathy. Disc levels: No disc protrusion, spinal stenosis, or subarticular zone/foraminal zone narrowing. Minor multilevel  facet arthropathy. IMPRESSION: Improved RIGHT T10 vertebral body lesion, most notably with no adjacent epidural tumor on the RIGHT. Improved LEFT-sided rib lesion is measurably smaller in cross-section. No new thoracic lesions. Unremarkable lumbar spine. No visible metastatic disease, epidural tumor, or compressive spondylosis. Electronically Signed   By: Staci Righter M.D.   On: 03/25/2019 14:47   Mr Lumbar Spine W Wo Contrast  Result Date: 03/25/2019 CLINICAL DATA:  Stage IV neuroendocrine lung carcinoma. Staging. Status post radiation to T10 lesion and adjacent ribs EXAM: MRI THORACIC AND LUMBAR SPINE WITHOUT AND WITH CONTRAST TECHNIQUE: Multiplanar and  multiecho pulse sequences of the thoracic and lumbar spine were obtained without and with intravenous contrast. CONTRAST:  Gadavist 6 mL. COMPARISON:  Multiple priors. FINDINGS: MRI THORACIC SPINE FINDINGS Alignment:  Anatomic Vertebrae: T10 lesion is redemonstrated, RIGHT hemivertebra, with improved vertebral body component as well as improved pedicle and posterior element component. The most notable finding is lack of adjacent epidural tumor. LEFT-sided rib lesion, also improved, now 6 x 16 mm cross-section compared with previous 10 x 20 mm. No new deposits are identified. Cord:  Normal in signal and morphology. Paraspinal and other soft tissues: Adenopathy and pulmonary lesions are better demonstrated on CT. Unchanged soft tissue along the ventral aspect of the LEFT twelfth rib. Disc levels: No disc protrusion is evident. MRI LUMBAR SPINE FINDINGS Segmentation:  Standard. Alignment:  Physiologic Vertebrae: No visible lumbar metastatic disease. Fatty replaced marrow. Conus medullaris: Extends to the L1 level and appears normal. Paraspinal and other soft tissues: Aortic atherosclerosis. No definite adenopathy. Disc levels: No disc protrusion, spinal stenosis, or subarticular zone/foraminal zone narrowing. Minor multilevel facet arthropathy. IMPRESSION: Improved RIGHT T10 vertebral body lesion, most notably with no adjacent epidural tumor on the RIGHT. Improved LEFT-sided rib lesion is measurably smaller in cross-section. No new thoracic lesions. Unremarkable lumbar spine. No visible metastatic disease, epidural tumor, or compressive spondylosis. Electronically Signed   By: Staci Righter M.D.   On: 03/25/2019 14:47   Ct Abdomen Pelvis W Contrast  Result Date: 04/11/2019 CLINICAL DATA:  Non-small cell lung cancer restaging EXAM: CT CHEST, ABDOMEN, AND PELVIS WITH CONTRAST TECHNIQUE: Multidetector CT imaging of the chest, abdomen and pelvis was performed following the standard protocol during bolus administration  of intravenous contrast. CONTRAST:  145mL OMNIPAQUE IOHEXOL 300 MG/ML  SOLN COMPARISON:  Multiple exams, including CT a chest from 03/04/2019 and CT abdomen from 02/26/2019 FINDINGS: CT CHEST FINDINGS Cardiovascular: Right Port-A-Cath tip: SVC. Thoracic aortic atherosclerotic vascular calcification. Mediastinum/Nodes: Left periaortic tumor measures 1.8 by 4.1 cm, formerly 1.8 by 3.8 cm. A necrotic pericardial mass abutting the pericardial apex measures 2.6 by 3.5 cm, formerly 2.8 by 3.1 cm by my measurements. Lungs/Pleura: Bandlike and nodular density at the right lung apex is not appreciably changed, and measures about 1.3 cm in thickness on image 33/7, formerly the same by my measurements. There is some confluent sub solid opacity in the superior segment right lower lobe which is bandlike and stable. Left apically scarring is stable. A left upper lobe pulmonary nodule measures 1.9 by 1.1 cm on image 64/7, formerly 1.8 by 1.2 cm. New small left pleural effusion, nonspecific for transudative or exudative etiology. Musculoskeletal: Ill-defined sclerosis in the right posterior T10 vertebral body, extending into the right pedicle, similar to the prior exam. Irregular sclerosis and periostitis of the left tenth rib probably with an associated pathologic fracture, and extensive surrounding enhancing soft tissue mass measuring up to 2.8 cm in thickness compatible with tumor. On  02/26/2019 this measured up to 2.2 cm in thickness. CT ABDOMEN PELVIS FINDINGS Hepatobiliary: Stable tiny hepatic hypodensities in the lateral segment left hepatic lobe and in segment 7 of the liver are technically too small to characterize although statistically likely to be small cysts or similar benign lesions, not appreciably changed from 02/26/2019. Gallbladder unremarkable. No significant biliary dilatation. Pancreas: Unremarkable Spleen: Unremarkable Adrenals/Urinary Tract: Scattered cysts throughout the kidneys are most cases technically  too small to characterize and seem to have varying complexity. In particular the 2.0 by 1.9 cm lesion in the right mid to lower kidney on image 73/2 probably has internal septations and complex components. Scattered bilateral nonobstructive nephrolithiasis including a dominant clustered calcification in the right kidney lower pole measuring up to 0.8 cm in long axis on image 77/2. There is circumferential accentuated enhancement and borderline wall thickening of the right renal collecting system, right kidney lower pole infundibulum, and right ureter extending down to the urinary bladder, without hydronephrosis or hydroureter. This is likely inflammatory given the length of involvement. Stomach/Bowel: Descending and sigmoid colon diverticulosis. Previous intramural abscess along the sigmoid colon is no longer well seen. Vascular/Lymphatic: Aortoiliac atherosclerotic vascular disease. Chronic mural thrombus in the lower abdominal aorta. Reproductive: 1.1 cm right adnexal cystic lesion, unchanged from previous. Otherwise unremarkable. Other: No supplemental non-categorized findings. Musculoskeletal: Unremarkable IMPRESSION: 1. Compared to CT examinations from last month, the left periaortic and left pericardial dominant tumors appear stable, and the bandlike nodular density at the right apex is likewise unchanged. Stable confluent sub solid opacity in the superior segment right lower lobe. Stable left upper lobe pulmonary nodule. 2. The intramural abscess along the sigmoid colon is no longer visible. Considerable descending and sigmoid colon diverticulosis. 3. New small left pleural effusion, nonspecific for transudative or exudative etiology. 4. Increase in soft tissue tumor surrounding the metastatic lesion in the left tenth rib. Stable ill-defined sclerosis in the right posterior T10 vertebral body extending into the right pedicle, favoring malignancy. 5. Scattered cystic lesions of varying complexity throughout  both kidneys. Cystic neoplasm difficult to exclude in some locations, many of these lesions are technically too small to characterize. Surveillance is likely warranted. 6. New circumferential wall thickening in the right renal collecting system and right ureter as well as a right kidney lower pole infundibulum. This is most likely inflammatory given the long segment of involvement, and could be from a recent stone passage. There is also bilateral nonobstructive nephrolithiasis. 7. Other imaging findings of potential clinical significance: Aortic Atherosclerosis (ICD10-I70.0). Stable tiny hepatic hypodensities are likely benign. Electronically Signed   By: Van Clines M.D.   On: 04/11/2019 11:08    ASSESSMENT AND PLAN: This is a very pleasant 68 years old white female recently diagnosed with high-grade neuroendocrine carcinoma. The patient underwent systemic chemotherapy with small cell regimen including carboplatin, etoposide and Tecentriq status post 2 cycles. She tolerated her treatment well but unfortunately repeat CT scan of the chest, abdomen and pelvis showed evidence for disease progression. The patient was started on systemic chemotherapy with carboplatin, Alimta and Keytruda status post 6 cycles.   She continues to tolerate this treatment well with no concerning complaints except for the itching and she is currently on Atarax.   She also had significant pancytopenia after the last cycle of her treatment. The patient had repeat CT scan of the chest, abdomen pelvis performed recently.  I personally and independently reviewed the scans and discussed the results with the patient and her son. Her scan  showed no evidence for disease progression except for enlargement of the posterior left 10th rib lesion. I recommended for the patient to continue her current treatment but with single agent Keytruda for now secondary to the significant pancytopenia after the last cycle. I will refer the patient  to Dr. Isidore Moos for consideration of palliative radiotherapy to the enlarging posterior left 10th rib lesion. For the malnutrition, I will refer the patient to the dietitian for evaluation. For the suspicious nonocclusive blood clot at the tip of the Port-A-Cath, she was started on Coumadin 5 mg p.o. daily.  We will continue to monitor her PT/INR closely. For the hypokalemia, will give the patient potassium supplement. For the anxiety and gagging reflex with medication, I will start the patient on Xanax 0.25 mg p.o. twice daily as needed. I will also send blood test to [facility] for molecular studies. The patient will come back for follow-up visit in 3 weeks for evaluation before the next cycle of her treatment. The patient was advised to call immediately if she has any concerning symptoms in the interval. The patient voices understanding of current disease status and treatment options and is in agreement with the current care plan. All questions were answered. The patient knows to call the clinic with any problems, questions or concerns. We can certainly see the patient much sooner if necessary.  Disclaimer: This note was dictated with voice recognition software. Similar sounding words can inadvertently be transcribed and may not be corrected upon review.

## 2019-04-22 NOTE — Patient Instructions (Signed)
[facility] Discharge Instructions for Patients Receiving Chemotherapy  Today you received the following chemotherapy agents Pembrolizumab (KEYTRUDA).  To help prevent nausea and vomiting after your treatment, we encourage you to take your nausea medication as prescribed.   If you develop nausea and vomiting that is not controlled by your nausea medication, call the clinic.   BELOW ARE SYMPTOMS THAT SHOULD BE REPORTED IMMEDIATELY:  *FEVER GREATER THAN 100.5 F  *CHILLS WITH OR WITHOUT FEVER  NAUSEA AND VOMITING THAT IS NOT CONTROLLED WITH YOUR NAUSEA MEDICATION  *UNUSUAL SHORTNESS OF BREATH  *UNUSUAL BRUISING OR BLEEDING  TENDERNESS IN MOUTH AND THROAT WITH OR WITHOUT PRESENCE OF ULCERS  *URINARY PROBLEMS  *BOWEL PROBLEMS  UNUSUAL RASH Items with * indicate a potential emergency and should be followed up as soon as possible.  Feel free to call the clinic should you have any questions or concerns. The clinic phone number is [phone].  Please show the CHEMO ALERT CARD at check-in to the Emergency Department and triage nurse.  Coronavirus (COVID-19) Are you at risk?  Are you at risk for the Coronavirus (COVID-19)?  To be considered HIGH RISK for Coronavirus (COVID-19), you have to meet the following criteria:  . Traveled to [place], [place], [place], [place] or [place]; or in the [place] to [place], [place], [place], or [place]; and have fever, cough, and shortness of breath within the last 2 weeks of travel OR . Been in close contact with a person diagnosed with COVID-19 within the last 2 weeks and have fever, cough, and shortness of breath . IF YOU DO NOT MEET THESE CRITERIA, YOU ARE CONSIDERED LOW RISK FOR COVID-19.  What to do if you are HIGH RISK for COVID-19?  . If you are having a medical emergency, call 911. . Seek medical care right away. Before you go to a doctor's office, urgent care or emergency department, call ahead and tell  them about your recent travel, contact with someone diagnosed with COVID-19, and your symptoms. You should receive instructions from your physician's office regarding next steps of care.  . When you arrive at healthcare provider, tell the healthcare staff immediately you have returned from visiting [place], [place], [place], [place] or [place]; or traveled in the [place] to [place], [place], [place], or [place]; in the last two weeks or you have been in close contact with a person diagnosed with COVID-19 in the last 2 weeks.   . Tell the health care staff about your symptoms: fever, cough and shortness of breath. . After you have been seen by a medical provider, you will be either: o Tested for (COVID-19) and discharged home on quarantine except to seek medical care if symptoms worsen, and asked to  - Stay home and avoid contact with others until you get your results (4-5 days)  - Avoid travel on public transportation if possible (such as bus, train, or airplane) or o Sent to the Emergency Department by EMS for evaluation, COVID-19 testing, and possible admission depending on your condition and test results.  What to do if you are LOW RISK for COVID-19?  Reduce your risk of any infection by using the same precautions used for avoiding the common cold or flu:  . Wash your hands often with soap and warm water for at least 20 seconds.  If soap and water are not readily available, use an alcohol-based hand sanitizer with at least 60% alcohol.  . If coughing or   sneezing, cover your mouth and nose by coughing or sneezing into the elbow areas of your shirt or coat, into a tissue or into your sleeve (not your hands). . Avoid shaking hands with others and consider head nods or verbal greetings only. . Avoid touching your eyes, nose, or mouth with unwashed hands.  . Avoid close contact with people who are sick. . Avoid places or events with large numbers of people in one location, like concerts or  sporting events. . Carefully consider travel plans you have or are making. . If you are planning any travel outside or inside the [place], visit the CDC's Travelers' Health webpage for the latest health notices. . If you have some symptoms but not all symptoms, continue to monitor at home and seek medical attention if your symptoms worsen. . If you are having a medical emergency, call 911.   ADDITIONAL HEALTHCARE OPTIONS FOR PATIENTS  [facility] / e-Visit: [email or web address]         [facility]: [phone]  [facility]: [phone]                   [facility]: [phone]    

## 2019-04-23 ENCOUNTER — Telehealth: Payer: Self-pay

## 2019-04-23 ENCOUNTER — Other Ambulatory Visit: Payer: Self-pay | Admitting: Medical Oncology

## 2019-04-23 ENCOUNTER — Ambulatory Visit
Admission: RE | Admit: 2019-04-23 | Discharge: 2019-04-23 | Disposition: A | Discharge status: Home / Self Care | Payer: Medicare Other | Source: Ambulatory Visit | Attending: Radiation Oncology | Admitting: Radiation Oncology

## 2019-04-23 ENCOUNTER — Other Ambulatory Visit: Payer: Self-pay

## 2019-04-23 ENCOUNTER — Encounter: Payer: Self-pay | Admitting: Radiation Oncology

## 2019-04-23 DIAGNOSIS — C7951 Secondary malignant neoplasm of bone: Secondary | ICD-10-CM

## 2019-04-23 MED ORDER — MAGNESIUM OXIDE 400 (241.3 MG) MG PO TABS: 400.0000 mg | ORAL_TABLET | Freq: Two times a day (BID) | ORAL | 0 refills | Status: AC

## 2019-04-23 NOTE — Telephone Encounter (Signed)
Received a referral from Cassie to sch Ms. Rotunno to speak w/a nutritionist. I cld and scheduled Ms. Toman for a telephone visit w/Joli on 6/17 at 145pm.

## 2019-04-23 NOTE — Progress Notes (Signed)
Radiation Oncology         [phone] ________________________________  Outpatient Re-Consultation by WebEx due to pandemic precautions  Name: Alison Sullivan MRN: [id number]  Date: 04/23/2019  DOB: 27-Dec-1950  HK:[id number], Real Cons, MD  Heilingoetter, Cassandr*   REFERRING PHYSICIAN: Heilingoetter, Cassandr*  DIAGNOSIS:    ICD-10-CM   1. Bone metastases (HCC) C79.51     Stage IV lung cancer with metastatic disease to the thoracic spine and ribs  CHIEF COMPLAINT: Here to discuss management of metastatic lung cancer  HISTORY OF PRESENT ILLNESS::Alison Sullivan is a 68 y.o. female who was admitted to [facility] recently with pancytopenia as well as significant fatigue, weakness, and dehydration following her last cycle of treatment. Repeat C/A/P CT scans, dated 04/11/2019, showed no evidence of disease progression except for an increase in soft tissue tumor surrounding the metastatic lesion in the left 10th rib, measuring up to 2.8 cm, compared to 2.2 cm on April scan. She does have significant pain from this area -she reports it is in the same area where she previously had pain before we palliated her with radiation to the left 10th rib in December 2019.  She initially had a great palliative response to 30 Gy in 10 fractions but now 6 months later she has refractory pain.  The patient reviewed imaging results with Dr. Julien Nordmann and has been referred today for discussion of palliative radiation treatment to the enlarging posterior left 10th rib lesion.  She reports that the pain to her spine has improved significantly is and is not bothering her anymore since receiving radiotherapy in March  She reports some esophagitis and has been taking Magic mouthwash for mouth sores but is spitting it out  Her chemotherapy will now be held but she will continue [place]  She is also dealing with itching of the skin related to her systemic therapy  PREVIOUS RADIATION THERAPY: Yes    Radiation treatment dates:   01/28/2019 - 02/08/2019 Site/dose:   Thoracic Spine, directed to T10 with margin, and adjacent posterior symptomatic rib mets / 30 Gy in 10 fractions of 3 Gy Radiation treatment dates:   10/23/18 - 11/06/18 Site/dose:   Left Lower Rib Mass / 30 Gy delivered in 10 fractions of 3 Gy  PAST MEDICAL HISTORY:  has a past medical history of Arthritis, Chronic kidney disease, Dog bite of wrist, History of kidney stones, History of radiation therapy (10/23/18- 11/06/18), Kidney infection, Left renal mass, Osteopenia, Pyelonephritis, and Recurrent UTI (urinary tract infection).    PAST SURGICAL HISTORY: Past Surgical History:  Procedure Laterality Date   CYSTOSCOPY WITH RETROGRADE PYELOGRAM, URETEROSCOPY AND STENT PLACEMENT Left 12/28/2017   Procedure: CYSTOSCOPY WITH RETROGRADE PYELOGRAM, URETEROSCOPY AND STENT PLACEMENT, STONE BASKETRY;  Surgeon: Festus Aloe, MD;  Location: [facility];  Service: Urology;  Laterality: Left;   FINGER SURGERY     index, growth removal   HOLMIUM LASER APPLICATION Left 8/75/6433   Procedure: HOLMIUM LASER APPLICATION;  Surgeon: Festus Aloe, MD;  Location: [facility];  Service: Urology;  Laterality: Left;   IR IMAGING GUIDED PORT INSERTION  10/31/2018   KIDNEY STONE SURGERY  2011 or 2012   LITHOTRIPSY  2007    FAMILY HISTORY: family history includes Cancer in her father; Melanoma in her father.  SOCIAL HISTORY:  reports that she has quit smoking. Her smoking use included cigarettes. She has a 7.50 pack-year smoking history. She has never used smokeless tobacco. She reports current alcohol use. She  reports that she does not use drugs.  ALLERGIES: Barium sulfate  MEDICATIONS:  Current Outpatient Medications  Medication Sig Dispense Refill   ALPRAZolam (XANAX) 0.25 MG tablet Take 1 tablet (0.25 mg total) by mouth 2 (two) times daily as needed for anxiety. 30 tablet 0   bisacodyl  (BISACODYL) 5 MG EC tablet Take 5 mg by mouth daily as needed for moderate constipation.     folic acid (FOLVITE) 1 MG tablet Take 1 tablet (1 mg total) by mouth daily. (Patient taking differently: Take 1 mg by mouth every evening. ) 30 tablet 4   gabapentin (NEURONTIN) 300 MG capsule Take 300 mg by mouth daily as needed (pain).     Hydrocortisone (GERHARDT'S BUTT CREAM) CREA Apply 1 application topically daily. 1 each 0   hydrOXYzine (ATARAX/VISTARIL) 25 MG tablet Take 1 tablet (25 mg total) by mouth 3 (three) times daily as needed for nausea (use Zofran 1st). 30 tablet 0   lidocaine-prilocaine (EMLA) cream Apply 1 application topically as needed. (Patient taking differently: Apply 1 application topically as needed (For port-a-cath.). ) 30 g 0   magnesium oxide (MAG-OX) 400 (241.3 Mg) MG tablet Take 1 tablet (400 mg total) by mouth 2 (two) times daily. 60 tablet 0   Melatonin 10 MG TABS Take 10 mg by mouth at bedtime.      oxyCODONE (OXY IR/ROXICODONE) 5 MG immediate release tablet Take 1-2 tablets (5-10 mg total) by mouth every 4 (four) hours as needed for severe pain. 120 tablet 0   potassium chloride (K-DUR) 10 MEQ tablet Take 1 tablet (10 mEq total) by mouth 2 (two) times daily. 20 tablet 0   warfarin (COUMADIN) 5 MG tablet TAKE 1 TABLET(5 MG) BY MOUTH DAILY (Patient taking differently: Take 2.5-5 mg by mouth every evening. Take 2.5mg  on Monday and Thursday and 5mg  the rest of the week.) 90 tablet 1   loperamide (IMODIUM) 2 MG capsule Take 1 capsule (2 mg total) by mouth as needed for diarrhea or loose stools. (Patient not taking: Reported on 04/22/2019) 30 capsule 0   magic mouthwash SOLN Take 5 mLs by mouth 4 (four) times daily as needed for mouth pain. Components - Hydrocortisone 60 mg, Nystatin suspension 30 ml , Benadryl 12.5mg  /5 ml QS 240 ml. (Patient not taking: Reported on 04/22/2019) 240 mL 1   omeprazole (PRILOSEC) 40 MG capsule Take 1 capsule (40 mg total) by mouth daily.  (Patient not taking: Reported on 04/15/2019) 30 capsule 2   polyethylene glycol (MIRALAX / GLYCOLAX) 17 g packet Take 17 g by mouth daily as needed for mild constipation. (Patient not taking: Reported on 04/22/2019) 14 each 0   prochlorperazine (COMPAZINE) 10 MG tablet Take 1 tablet (10 mg total) by mouth every 6 (six) hours as needed for nausea or vomiting. (Patient not taking: Reported on 04/23/2019) 30 tablet 0   No current facility-administered medications for this encounter.    Facility-Administered Medications Ordered in Other Encounters  Medication Dose Route Frequency Provider Last Rate Last Dose   morphine 4 MG/ML injection 2 mg  2 mg Intramuscular NOW [name], MD        REVIEW OF SYSTEMS:  Notable for that above.   PHYSICAL EXAM:  vitals were not taken for this visit.   No acute distress  LABORATORY DATA:  Lab Results  Component Value Date   WBC 3.7 (L) 04/22/2019   HGB 10.2 (L) 04/22/2019   HCT 31.6 (L) 04/22/2019   MCV 99.4 04/22/2019  PLT 116 (L) 04/22/2019   CMP     Component Value Date/Time   NA 138 04/22/2019 0834   K 3.5 04/22/2019 0834   CL 104 04/22/2019 0834   CO2 22 04/22/2019 0834   GLUCOSE 122 (H) 04/22/2019 0834   BUN 12 04/22/2019 0834   CREATININE 0.91 04/22/2019 0834   CALCIUM 8.5 (L) 04/22/2019 0834   PROT 6.9 04/22/2019 0834   ALBUMIN 2.9 (L) 04/22/2019 0834   AST 61 (H) 04/22/2019 0834   ALT 33 04/22/2019 0834   ALKPHOS 128 (H) 04/22/2019 0834   BILITOT 0.6 04/22/2019 0834   GFRNONAA >60 04/22/2019 0834   GFRAA >60 04/22/2019 0834         RADIOGRAPHY: Dg Ribs Unilateral Left  Result Date: 03/27/2019 CLINICAL DATA:  Neuroendocrine tumor left lung stage IV. Left rib pain EXAM: LEFT RIBS - 2 VIEW COMPARISON:  CT chest 03/04/2019 FINDINGS: Left upper lobe nodule appears improved compared to the prior study. Negative for acute infiltrate effusion or pneumothorax on the left. Apically pleural scarring. Port-A-Cath tip SVC Negative for left  rib fracture or rib lesion. IMPRESSION: No acute abnormality.  No rib fracture or lesion on the left. Left upper lobe nodule appears smaller compared with chest CT of 03/04/2019 Electronically Signed   By: Franchot Gallo M.D.   On: 03/27/2019 15:18   Ct Chest W Contrast  Result Date: 04/11/2019 CLINICAL DATA:  Non-small cell lung cancer restaging EXAM: CT CHEST, ABDOMEN, AND PELVIS WITH CONTRAST TECHNIQUE: Multidetector CT imaging of the chest, abdomen and pelvis was performed following the standard protocol during bolus administration of intravenous contrast. CONTRAST:  188mL OMNIPAQUE IOHEXOL 300 MG/ML  SOLN COMPARISON:  Multiple exams, including CT a chest from 03/04/2019 and CT abdomen from 02/26/2019 FINDINGS: CT CHEST FINDINGS Cardiovascular: Right Port-A-Cath tip: SVC. Thoracic aortic atherosclerotic vascular calcification. Mediastinum/Nodes: Left periaortic tumor measures 1.8 by 4.1 cm, formerly 1.8 by 3.8 cm. A necrotic pericardial mass abutting the pericardial apex measures 2.6 by 3.5 cm, formerly 2.8 by 3.1 cm by my measurements. Lungs/Pleura: Bandlike and nodular density at the right lung apex is not appreciably changed, and measures about 1.3 cm in thickness on image 33/7, formerly the same by my measurements. There is some confluent sub solid opacity in the superior segment right lower lobe which is bandlike and stable. Left apically scarring is stable. A left upper lobe pulmonary nodule measures 1.9 by 1.1 cm on image 64/7, formerly 1.8 by 1.2 cm. New small left pleural effusion, nonspecific for transudative or exudative etiology. Musculoskeletal: Ill-defined sclerosis in the right posterior T10 vertebral body, extending into the right pedicle, similar to the prior exam. Irregular sclerosis and periostitis of the left tenth rib probably with an associated pathologic fracture, and extensive surrounding enhancing soft tissue mass measuring up to 2.8 cm in thickness compatible with tumor. On  02/26/2019 this measured up to 2.2 cm in thickness. CT ABDOMEN PELVIS FINDINGS Hepatobiliary: Stable tiny hepatic hypodensities in the lateral segment left hepatic lobe and in segment 7 of the liver are technically too small to characterize although statistically likely to be small cysts or similar benign lesions, not appreciably changed from 02/26/2019. Gallbladder unremarkable. No significant biliary dilatation. Pancreas: Unremarkable Spleen: Unremarkable Adrenals/Urinary Tract: Scattered cysts throughout the kidneys are most cases technically too small to characterize and seem to have varying complexity. In particular the 2.0 by 1.9 cm lesion in the right mid to lower kidney on image 73/2 probably has internal septations and complex components.  Scattered bilateral nonobstructive nephrolithiasis including a dominant clustered calcification in the right kidney lower pole measuring up to 0.8 cm in long axis on image 77/2. There is circumferential accentuated enhancement and borderline wall thickening of the right renal collecting system, right kidney lower pole infundibulum, and right ureter extending down to the urinary bladder, without hydronephrosis or hydroureter. This is likely inflammatory given the length of involvement. Stomach/Bowel: Descending and sigmoid colon diverticulosis. Previous intramural abscess along the sigmoid colon is no longer well seen. Vascular/Lymphatic: Aortoiliac atherosclerotic vascular disease. Chronic mural thrombus in the lower abdominal aorta. Reproductive: 1.1 cm right adnexal cystic lesion, unchanged from previous. Otherwise unremarkable. Other: No supplemental non-categorized findings. Musculoskeletal: Unremarkable IMPRESSION: 1. Compared to CT examinations from last month, the left periaortic and left pericardial dominant tumors appear stable, and the bandlike nodular density at the right apex is likewise unchanged. Stable confluent sub solid opacity in the superior segment  right lower lobe. Stable left upper lobe pulmonary nodule. 2. The intramural abscess along the sigmoid colon is no longer visible. Considerable descending and sigmoid colon diverticulosis. 3. New small left pleural effusion, nonspecific for transudative or exudative etiology. 4. Increase in soft tissue tumor surrounding the metastatic lesion in the left tenth rib. Stable ill-defined sclerosis in the right posterior T10 vertebral body extending into the right pedicle, favoring malignancy. 5. Scattered cystic lesions of varying complexity throughout both kidneys. Cystic neoplasm difficult to exclude in some locations, many of these lesions are technically too small to characterize. Surveillance is likely warranted. 6. New circumferential wall thickening in the right renal collecting system and right ureter as well as a right kidney lower pole infundibulum. This is most likely inflammatory given the long segment of involvement, and could be from a recent stone passage. There is also bilateral nonobstructive nephrolithiasis. 7. Other imaging findings of potential clinical significance: Aortic Atherosclerosis (ICD10-I70.0). Stable tiny hepatic hypodensities are likely benign. Electronically Signed   By: Van Clines M.D.   On: 04/11/2019 11:08   Mr Thoracic Spine W Wo Contrast  Result Date: 03/25/2019 CLINICAL DATA:  Stage IV neuroendocrine lung carcinoma. Staging. Status post radiation to T10 lesion and adjacent ribs EXAM: MRI THORACIC AND LUMBAR SPINE WITHOUT AND WITH CONTRAST TECHNIQUE: Multiplanar and multiecho pulse sequences of the thoracic and lumbar spine were obtained without and with intravenous contrast. CONTRAST:  Gadavist 6 mL. COMPARISON:  Multiple priors. FINDINGS: MRI THORACIC SPINE FINDINGS Alignment:  Anatomic Vertebrae: T10 lesion is redemonstrated, RIGHT hemivertebra, with improved vertebral body component as well as improved pedicle and posterior element component. The most notable finding  is lack of adjacent epidural tumor. LEFT-sided rib lesion, also improved, now 6 x 16 mm cross-section compared with previous 10 x 20 mm. No new deposits are identified. Cord:  Normal in signal and morphology. Paraspinal and other soft tissues: Adenopathy and pulmonary lesions are better demonstrated on CT. Unchanged soft tissue along the ventral aspect of the LEFT twelfth rib. Disc levels: No disc protrusion is evident. MRI LUMBAR SPINE FINDINGS Segmentation:  Standard. Alignment:  Physiologic Vertebrae: No visible lumbar metastatic disease. Fatty replaced marrow. Conus medullaris: Extends to the L1 level and appears normal. Paraspinal and other soft tissues: Aortic atherosclerosis. No definite adenopathy. Disc levels: No disc protrusion, spinal stenosis, or subarticular zone/foraminal zone narrowing. Minor multilevel facet arthropathy. IMPRESSION: Improved RIGHT T10 vertebral body lesion, most notably with no adjacent epidural tumor on the RIGHT. Improved LEFT-sided rib lesion is measurably smaller in cross-section. No new thoracic lesions. Unremarkable lumbar  spine. No visible metastatic disease, epidural tumor, or compressive spondylosis. Electronically Signed   By: Staci Righter M.D.   On: 03/25/2019 14:47   Mr Lumbar Spine W Wo Contrast  Result Date: 03/25/2019 CLINICAL DATA:  Stage IV neuroendocrine lung carcinoma. Staging. Status post radiation to T10 lesion and adjacent ribs EXAM: MRI THORACIC AND LUMBAR SPINE WITHOUT AND WITH CONTRAST TECHNIQUE: Multiplanar and multiecho pulse sequences of the thoracic and lumbar spine were obtained without and with intravenous contrast. CONTRAST:  Gadavist 6 mL. COMPARISON:  Multiple priors. FINDINGS: MRI THORACIC SPINE FINDINGS Alignment:  Anatomic Vertebrae: T10 lesion is redemonstrated, RIGHT hemivertebra, with improved vertebral body component as well as improved pedicle and posterior element component. The most notable finding is lack of adjacent epidural tumor.  LEFT-sided rib lesion, also improved, now 6 x 16 mm cross-section compared with previous 10 x 20 mm. No new deposits are identified. Cord:  Normal in signal and morphology. Paraspinal and other soft tissues: Adenopathy and pulmonary lesions are better demonstrated on CT. Unchanged soft tissue along the ventral aspect of the LEFT twelfth rib. Disc levels: No disc protrusion is evident. MRI LUMBAR SPINE FINDINGS Segmentation:  Standard. Alignment:  Physiologic Vertebrae: No visible lumbar metastatic disease. Fatty replaced marrow. Conus medullaris: Extends to the L1 level and appears normal. Paraspinal and other soft tissues: Aortic atherosclerosis. No definite adenopathy. Disc levels: No disc protrusion, spinal stenosis, or subarticular zone/foraminal zone narrowing. Minor multilevel facet arthropathy. IMPRESSION: Improved RIGHT T10 vertebral body lesion, most notably with no adjacent epidural tumor on the RIGHT. Improved LEFT-sided rib lesion is measurably smaller in cross-section. No new thoracic lesions. Unremarkable lumbar spine. No visible metastatic disease, epidural tumor, or compressive spondylosis. Electronically Signed   By: Staci Righter M.D.   On: 03/25/2019 14:47   Ct Abdomen Pelvis W Contrast  Result Date: 04/11/2019 CLINICAL DATA:  Non-small cell lung cancer restaging EXAM: CT CHEST, ABDOMEN, AND PELVIS WITH CONTRAST TECHNIQUE: Multidetector CT imaging of the chest, abdomen and pelvis was performed following the standard protocol during bolus administration of intravenous contrast. CONTRAST:  179mL OMNIPAQUE IOHEXOL 300 MG/ML  SOLN COMPARISON:  Multiple exams, including CT a chest from 03/04/2019 and CT abdomen from 02/26/2019 FINDINGS: CT CHEST FINDINGS Cardiovascular: Right Port-A-Cath tip: SVC. Thoracic aortic atherosclerotic vascular calcification. Mediastinum/Nodes: Left periaortic tumor measures 1.8 by 4.1 cm, formerly 1.8 by 3.8 cm. A necrotic pericardial mass abutting the pericardial apex  measures 2.6 by 3.5 cm, formerly 2.8 by 3.1 cm by my measurements. Lungs/Pleura: Bandlike and nodular density at the right lung apex is not appreciably changed, and measures about 1.3 cm in thickness on image 33/7, formerly the same by my measurements. There is some confluent sub solid opacity in the superior segment right lower lobe which is bandlike and stable. Left apically scarring is stable. A left upper lobe pulmonary nodule measures 1.9 by 1.1 cm on image 64/7, formerly 1.8 by 1.2 cm. New small left pleural effusion, nonspecific for transudative or exudative etiology. Musculoskeletal: Ill-defined sclerosis in the right posterior T10 vertebral body, extending into the right pedicle, similar to the prior exam. Irregular sclerosis and periostitis of the left tenth rib probably with an associated pathologic fracture, and extensive surrounding enhancing soft tissue mass measuring up to 2.8 cm in thickness compatible with tumor. On 02/26/2019 this measured up to 2.2 cm in thickness. CT ABDOMEN PELVIS FINDINGS Hepatobiliary: Stable tiny hepatic hypodensities in the lateral segment left hepatic lobe and in segment 7 of the liver are technically  too small to characterize although statistically likely to be small cysts or similar benign lesions, not appreciably changed from 02/26/2019. Gallbladder unremarkable. No significant biliary dilatation. Pancreas: Unremarkable Spleen: Unremarkable Adrenals/Urinary Tract: Scattered cysts throughout the kidneys are most cases technically too small to characterize and seem to have varying complexity. In particular the 2.0 by 1.9 cm lesion in the right mid to lower kidney on image 73/2 probably has internal septations and complex components. Scattered bilateral nonobstructive nephrolithiasis including a dominant clustered calcification in the right kidney lower pole measuring up to 0.8 cm in long axis on image 77/2. There is circumferential accentuated enhancement and borderline  wall thickening of the right renal collecting system, right kidney lower pole infundibulum, and right ureter extending down to the urinary bladder, without hydronephrosis or hydroureter. This is likely inflammatory given the length of involvement. Stomach/Bowel: Descending and sigmoid colon diverticulosis. Previous intramural abscess along the sigmoid colon is no longer well seen. Vascular/Lymphatic: Aortoiliac atherosclerotic vascular disease. Chronic mural thrombus in the lower abdominal aorta. Reproductive: 1.1 cm right adnexal cystic lesion, unchanged from previous. Otherwise unremarkable. Other: No supplemental non-categorized findings. Musculoskeletal: Unremarkable IMPRESSION: 1. Compared to CT examinations from last month, the left periaortic and left pericardial dominant tumors appear stable, and the bandlike nodular density at the right apex is likewise unchanged. Stable confluent sub solid opacity in the superior segment right lower lobe. Stable left upper lobe pulmonary nodule. 2. The intramural abscess along the sigmoid colon is no longer visible. Considerable descending and sigmoid colon diverticulosis. 3. New small left pleural effusion, nonspecific for transudative or exudative etiology. 4. Increase in soft tissue tumor surrounding the metastatic lesion in the left tenth rib. Stable ill-defined sclerosis in the right posterior T10 vertebral body extending into the right pedicle, favoring malignancy. 5. Scattered cystic lesions of varying complexity throughout both kidneys. Cystic neoplasm difficult to exclude in some locations, many of these lesions are technically too small to characterize. Surveillance is likely warranted. 6. New circumferential wall thickening in the right renal collecting system and right ureter as well as a right kidney lower pole infundibulum. This is most likely inflammatory given the long segment of involvement, and could be from a recent stone passage. There is also bilateral  nonobstructive nephrolithiasis. 7. Other imaging findings of potential clinical significance: Aortic Atherosclerosis (ICD10-I70.0). Stable tiny hepatic hypodensities are likely benign. Electronically Signed   By: Van Clines M.D.   On: 04/11/2019 11:08      IMPRESSION/PLAN: Unfortunately she has clinical progression of her left 10th rib lesion.  This initially responded well to palliative radiotherapy and then the pain returned several months later.  I think it is reasonable to retreat this area.  We will slightly change the fractionation this time.  I plan to treat her at 2.5 Gy per fraction to a total dose of 32.5 Gy.  I will use IMRT to shield her bowel and kidneys and minimize the amount of normal tissue getting the full dose of radiotherapy in the setting of reirradiation.  The patient understands there are some increased risks due to reirradiation but I think that overall she has a likelihood of tolerating this well in the acute in the late setting.  She is only continuing Keytruda at this point for her systemic therapy and I think it is okay for her to continue that during the radiotherapy as long as medical oncology does not have any objections.  We will simulate her tomorrow and signed a consent form then.  She is very pleased with this plan.  We did discuss that possibility that if she has refractory pain despite a second course of radiotherapy that she may need to undergo a nerve block in the future months or years.  Regarding her esophagitis I suggested she start swallowing the Magic mouthwash to coat her esophagus up to 4 times a day  This encounter was provided by telemedicine platform Webex.  The patient has given verbal consent for this type of encounter and has been advised to only accept a meeting of this type in a secure network environment. The time spent during this encounter was 20 minutes. The attendants for this meeting include [name]  and [name].  During  the encounter, [name] was located at [facility] Radiation Oncology Department.  [name] was located at home.      __________________________________________   [name], MD  This document serves as a record of services personally performed by [name], MD. It was created on her behalf by Rae Lips, a trained medical scribe. The creation of this record is based on the scribe's personal observations and the provider's statements to them. This document has been checked and approved by the attending provider.

## 2019-04-23 NOTE — Progress Notes (Signed)
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   Sites of Visceral and Bony Metastatic Disease: posterior left 10th rib lesion.   Location(s) of Symptomatic Metastases: 10th rib lesion.  Past/Anticipated chemotherapy by medical oncology, if any:  04/22/19 Dr. Julien Nordmann  PRIOR THERAPY:  1) systemic chemotherapy with carboplatin for AUC of 5 on day 1, etoposide 100 mg/M2 on days 1, 2 and 3 as well as Tecentriq 1200 mg IV every 3 weeks with Neulasta support.  Status post 2 cycles, discontinued secondary to disease progression. 2) palliative radiotherapy to progressive T10 vertebral body bone lesion under the care of Dr. Isidore Moos CURRENT THERAPY: Systemic chemotherapy with carboplatin for AUC of 5, Alimta 500 mg/M2 and Keytruda 200 mg IV every 3 weeks.  First dose December 17, 2018.  Status post 6 cycles.  Starting cycle #5 the patient is currently on maintenance treatment with Alimta and Keytruda.  ASSESSMENT AND PLAN:  The patient had repeat CT scan of the chest, abdomen pelvis performed recently.  I personally and independently reviewed the scans and discussed the results with the patient and her son. Her scan showed no evidence for disease progression except for enlargement of the posterior left 10th rib lesion. I recommended for the patient to continue her current treatment but with single agent Keytruda for now secondary to the significant pancytopenia after the last cycle. I will refer the patient to Dr. Isidore Moos for consideration of palliative radiotherapy to the enlarging posterior left 10th rib lesion.  Pain on a scale of 0-10 is: She reports pain level of up to an 8 or 9. It is slightly relieved with pain medicine.    If Spine Met(s), symptoms, if any, include: N/A  Bowel/Bladder retention or incontinence (please describe):   Numbness or weakness in extremities  (please describe):   Current Decadron regimen, if applicable:   Ambulatory status? Walker? Wheelchair?: She is ambulatory.   SAFETY ISSUES:  Prior radiation? Yes,  Radiation treatment dates:   01/28/2019 - 02/08/2019 Site/dose:   Thoracic Spine, directed to T10 with margin, and adjacent posterior symptomatic rib mets / 30 Gy in 10 fractions of 3 Gy Radiation treatment dates:   10/23/18 - 11/06/18 Site/dose:   Left Lower Rib Mass / 30 Gy delivered in 10 fractions of 3 Gy  Pacemaker/ICD? No  Possible current pregnancy? No  Is the patient on methotrexate? No  Current Complaints / other details:

## 2019-04-24 ENCOUNTER — Telehealth: Payer: Self-pay | Admitting: Medical Oncology

## 2019-04-24 ENCOUNTER — Ambulatory Visit (HOSPITAL_COMMUNITY)
Admission: RE | Admit: 2019-04-24 | Discharge: 2019-04-24 | Disposition: A | Discharge status: Home / Self Care | Payer: Medicare Other | Source: Ambulatory Visit | Attending: Radiation Oncology | Admitting: Radiation Oncology

## 2019-04-24 ENCOUNTER — Encounter: Payer: Self-pay | Admitting: Radiation Oncology

## 2019-04-24 ENCOUNTER — Other Ambulatory Visit: Payer: Self-pay | Admitting: Physician Assistant

## 2019-04-24 ENCOUNTER — Other Ambulatory Visit: Payer: Self-pay | Admitting: Radiation Oncology

## 2019-04-24 ENCOUNTER — Telehealth: Payer: Self-pay | Admitting: *Deleted

## 2019-04-24 ENCOUNTER — Telehealth: Payer: Self-pay | Admitting: Emergency Medicine

## 2019-04-24 ENCOUNTER — Other Ambulatory Visit: Payer: Self-pay

## 2019-04-24 ENCOUNTER — Ambulatory Visit
Admission: RE | Admit: 2019-04-24 | Discharge: 2019-04-24 | Disposition: A | Discharge status: Home / Self Care | Payer: Medicare Other | Source: Ambulatory Visit | Attending: Radiation Oncology | Admitting: Radiation Oncology

## 2019-04-24 DIAGNOSIS — C3490 Malignant neoplasm of unspecified part of unspecified bronchus or lung: Secondary | ICD-10-CM | POA: Diagnosis not present

## 2019-04-24 DIAGNOSIS — C7951 Secondary malignant neoplasm of bone: Secondary | ICD-10-CM

## 2019-04-24 DIAGNOSIS — Z923 Personal history of irradiation: Secondary | ICD-10-CM | POA: Insufficient documentation

## 2019-04-24 MED ORDER — MORPHINE SULFATE (PF) 4 MG/ML IV SOLN
INTRAVENOUS | Status: AC
  Filled 2019-04-24: qty 1

## 2019-04-24 MED ORDER — MORPHINE SULFATE (PF) 4 MG/ML IV SOLN
2.0000 mg | INTRAVENOUS | Status: AC
  Administered 2019-04-24: 2 mg via INTRAMUSCULAR
  Filled 2019-04-24: qty 0.5

## 2019-04-24 NOTE — Telephone Encounter (Signed)
CHECKED PRECERT FOR TEST, THRU [facility] HOBGOOD, MEREDITH STATED THAT IT DIDN'T REQUIRE PRECERT, PATIENT TAKEN TO RADIOLOGY BY JENNIFER MALMFELT, NURSE FOR DR. SQUIRE

## 2019-04-24 NOTE — Addendum Note (Signed)
Encounter addended by: Keiandre Cygan, Stephani Police, RN on: 04/24/2019 9:09 AM  Actions taken: MAR administration accepted

## 2019-04-24 NOTE — Telephone Encounter (Signed)
Anderson Malta from radiation called asking for further advice on behalf of MD Isidore Moos for pt's continued chronic left side/rib pain.  Pt has been given IM morphine in radiation today and has been evaluated for this chronic pain before in [facility].  PA Lucianne Lei made aware and he returned called to Anderson Malta RN to let her know that PA Cassie with MD Julien Nordmann would be following up on pt's issue today.

## 2019-04-24 NOTE — Telephone Encounter (Signed)
Guardant results released to Dr Fatima Sanger at [facility] . Can they disregard testing sample from Dr Julien Nordmann?  Per Dr Julien Nordmann I told them to cancel Dr Ellan Lambert order.

## 2019-04-26 ENCOUNTER — Ambulatory Visit
Admission: RE | Admit: 2019-04-26 | Discharge: 2019-04-26 | Disposition: A | Discharge status: Home / Self Care | Payer: Medicare Other | Source: Ambulatory Visit | Attending: Radiation Oncology | Admitting: Radiation Oncology

## 2019-04-26 ENCOUNTER — Other Ambulatory Visit: Payer: Self-pay

## 2019-04-26 ENCOUNTER — Other Ambulatory Visit: Payer: Self-pay | Admitting: *Deleted

## 2019-04-26 DIAGNOSIS — C7951 Secondary malignant neoplasm of bone: Secondary | ICD-10-CM | POA: Diagnosis not present

## 2019-04-26 DIAGNOSIS — Z923 Personal history of irradiation: Secondary | ICD-10-CM | POA: Diagnosis not present

## 2019-04-26 NOTE — Patient Outreach (Signed)
[facility]) Care Management  04/26/2019  Alison Sullivan 03/19/1951 [id number]   Subjective: Telephone call to patient's home / mobile number, spoke with patient, and HIPAA verified.  Discussed [facility] Care Management Medicare EMMI General Discharge Red Flag Alert follow up, patient voiced understanding, and is in agreement to follow up.  Patient states she is doing fine, has received a new cancer site diagnosis, has a supportive network, has good oncologist, does not remember receiving EMMI automated calls, and does not pick up calls from unknown numbers.  States she has the following hospital follow up appointments: 04/26/2019 with radiologist oncologist, 05/01/2019 with nutritionist, 05/06/2019 -05/23/2019 daily radiation treatments, 05/13/2019 with medical oncologist, and infusion on 05/13/2019.    States she has not scheduled a follow up appointment with primary MD, has concerns with her primary MD (Dr. Pricilla Holm) regarding new cancer diagnosis, is planning to discuss with practice manager / administration, and is planning to request to be changed to another provider within the practice (Dr. Billey Gosling).  Patient states she attempted to address her concerns in the past with practice office staff, by requesting phone consult with Dr. Quay Burow, requested name of providers with in the practice that were were accepting Medicare patient, but did not pursue because she was involved in new cancer diagnosis and treatment.  States she has not seen primary MD since cancer diagnosis. Discussed importance of hospital follow up with primary MD, patient voices understanding, and states she will follow up as appropriate.  Patient states she is able to manage self care and has assistance as needed.  Patient voices understanding of medical diagnosis and treatment plan.  Patients she has reviewed all of her medications with her local pharmacist, has all of her prescribed medication, and all of new  prescriptions have been filled.  Patient states she does not have any education material, EMMI follow up, care coordination, care management, disease monitoring, transportation, community resource, or pharmacy needs at this time.  States she is very appreciative of the follow up and is in agreement to receive [place] Management information.      Objective: Per KPN (Knowledge Performance Now, point of care tool) and chart review, patient hospitalized 04/15/2019 -04/19/2019 for Large cell carcinoma of left lung, stage 4, Neutropenia, Pancytopenia.    Patient also has a history of metastatic high-grade neuroendocrine carcinoma, hyperlipidemia, nonocclusive thrombus under the Port-A-Cath, Chronic kidney disease, kidney stones, radiation therapy, Pyelonephritis and UTI.      Assessment: Received Medicare EMMI General Discharge Red Alert Flag follow up referral on 04/25/2019. Red Flag Alert triggers times 2, Day #4, patient answered no to the following question: Scheduled follow-up?  Patient answered yes to the following question: Unfilled prescriptions?    EMMI follow up completed and no further care management needs.     Plan: RNCM will send patient successful outreach letter, [facility] pamphlet, and magnet. RNCM will complete case closure due to follow up completed / no care management needs.      Moniqua Engebretsen H. Annia Friendly, BSN, [place] Management [facility] Telephonic CM Phone: [phone] Fax: [phone]

## 2019-04-30 ENCOUNTER — Telehealth: Payer: Self-pay | Admitting: *Deleted

## 2019-04-30 ENCOUNTER — Other Ambulatory Visit: Payer: Self-pay | Admitting: Radiation Oncology

## 2019-04-30 ENCOUNTER — Telehealth: Payer: Self-pay

## 2019-04-30 DIAGNOSIS — C7951 Secondary malignant neoplasm of bone: Secondary | ICD-10-CM

## 2019-04-30 LAB — GUARDANT 360

## 2019-04-30 MED ORDER — OXYCODONE HCL 5 MG PO TABS: 5.0000 mg | ORAL_TABLET | ORAL | 0 refills | Status: DC | PRN

## 2019-04-30 NOTE — Telephone Encounter (Signed)
Contacted pt to verify telephone visit for pre reg

## 2019-04-30 NOTE — Progress Notes (Signed)
  Radiation Oncology         ([phone]) [phone] ________________________________  Name: Alison Sullivan MRN: [id number]  Date: 04/26/2019  DOB: 07-Nov-1951  SIMULATION AND TREATMENT PLANNING NOTE/Special Treatment Procedure Note:  Outpatient  DIAGNOSIS:     ICD-10-CM   1. Bone metastases ([place])  C79.51     NARRATIVE:  The patient was brought to the [facility].  Identity was confirmed.  All relevant records and images related to the planned course of therapy were reviewed.  The patient freely provided informed written consent to proceed with treatment after reviewing the details related to the planned course of therapy. The consent form was witnessed and verified by the simulation staff.    Then, the patient was set-up in a stable reproducible  supine position for radiation therapy.  CT images were obtained.  Surface markings were placed.  The CT images were loaded into the planning software.    TREATMENT PLANNING NOTE: Treatment planning then occurred.  The radiation prescription was entered and confirmed.    A total of 1 medically necessary complex treatment devices were fabricated and supervised by me, in the form of Vaclock device. MORE FIELDS WITH MLCs MAY BE ADDED IN DOSIMETRY for dose homogeneity.  I have requested : Intensity Modulated Radiotherapy (IMRT) is medically necessary for this case for the following reason:  Previous treatment to this area, close to bowel, kidney and cord.  The patient will receive 32.5 Gy in 13 fractions to retreat the left posterior 10th rib mass.  Special Treatment Procedure Note: The patient received prior radiotherapy in her current fields. There will be overlap of radiation dose.  Prior regional radiotherapy increases the risk of side effects from treatment. I have considered this in the treatment planning process. This increases the complexity of this patient's treatment and therefore this constitutes a special treatment procedure.     -----------------------------------  [name], MD

## 2019-04-30 NOTE — Telephone Encounter (Signed)
REceived call from patient requesting refill of her oxycodone 5mg  tabs. Last filled per Dr. Lanell Persons on 02/04/19 for # 120. Pain med is for rib pain. Pt states she is completely out of pain meds Pharmacy is [facility] [address]

## 2019-05-01 ENCOUNTER — Inpatient Hospital Stay: Payer: Medicare Other

## 2019-05-01 DIAGNOSIS — Z923 Personal history of irradiation: Secondary | ICD-10-CM | POA: Diagnosis not present

## 2019-05-01 DIAGNOSIS — C7951 Secondary malignant neoplasm of bone: Secondary | ICD-10-CM | POA: Diagnosis not present

## 2019-05-01 NOTE — Progress Notes (Signed)
Nutrition Assessment   Reason for Assessment:   Patient with weight loss, poor appetite   ASSESSMENT:  68 year old female with stage IV lung cancer with metastatic disease to thoracic spine and ribs.  Past medical history of CKD.  Noted patient planning to start radiation to rib area on 6/22. Planning to continue [place].  Spoke with patient via phone as well as son Shea Stakes.  Patient reports no appetite or desire to eat.  Reports has aide that comes in 1 time per week. Has eaten vegetable soup for lunch today which she enjoyed.  Reports that if she does not have someone remind her to eat or puts something in front of her to eat would likely not eat.  Son reports that he just moved out of [place] recently and not able to help mother as before.  Hoping to get more care in the home.  Has not tried oral nutrition supplements.    Reports some issues with nausea and diarrhea but not consistently a problem after hospital visit  Recently.      Nutrition Focused Physical Exam: deferred   Medications: imodium, magic mouthwash, folic, prilosec, KCL, warfarin   Labs: reviewed on 6/8.   Anthropometrics:   Height: 66 inches Weight: 134 lb on 6/8  140 lb on 5/18.  Patient reports has lost about 10 lb recently.  Noted 145 lb on 02/22/2019 BMI: 21  7% weight loss in the last 2 months, signficant   Estimated Energy Needs  Kcals: 1980-2300 Protein: 99-115 g Fluid: > 1.9 L   NUTRITION DIAGNOSIS: Inadequate oral intake related to cancer and cancer related treatment side effects as evidenced by 7% weight loss in the last 2 months and poor appetite/lack of desire to eat   INTERVENTION:  Encouraged eating every 2 hours (small frequent meals) Discussed strategies to increase calories and protein.   Discussed foods high in K and Mag per patient request as both have been low. Interested in high calorie, high protein recipes.   Will provide sample oral nutrition supplement bag on Tuesday at  radiation.   Patient requested all handouts to be mailed and emailed to her.   Contact information provided.    MONITORING, EVALUATION, GOAL: Patient will consume adequate calories and protein to prevent further weight loss   Next Visit: phone f/u on July 7   Anea Fodera B. Zenia Resides, [place], [place] Registered Dietitian [phone] (pager)

## 2019-05-02 ENCOUNTER — Ambulatory Visit: Payer: Medicare Other | Admitting: Radiation Oncology

## 2019-05-02 DIAGNOSIS — C7A8 Other malignant neuroendocrine tumors: Secondary | ICD-10-CM | POA: Diagnosis not present

## 2019-05-03 ENCOUNTER — Ambulatory Visit: Payer: Medicare Other

## 2019-05-04 ENCOUNTER — Other Ambulatory Visit: Payer: Self-pay

## 2019-05-04 ENCOUNTER — Encounter (HOSPITAL_COMMUNITY): Payer: Self-pay

## 2019-05-04 ENCOUNTER — Inpatient Hospital Stay (HOSPITAL_COMMUNITY)
Admission: EM | Admit: 2019-05-04 | Discharge: 2019-05-07 | DRG: 641 | Disposition: A | Discharge status: Home / Self Care | Payer: Medicare Other | Attending: Internal Medicine | Admitting: Internal Medicine

## 2019-05-04 DIAGNOSIS — C7951 Secondary malignant neoplasm of bone: Secondary | ICD-10-CM | POA: Diagnosis not present

## 2019-05-04 DIAGNOSIS — M8580 Other specified disorders of bone density and structure, unspecified site: Secondary | ICD-10-CM | POA: Diagnosis present

## 2019-05-04 DIAGNOSIS — Z800 Family history of malignant neoplasm of digestive organs: Secondary | ICD-10-CM

## 2019-05-04 DIAGNOSIS — Z9221 Personal history of antineoplastic chemotherapy: Secondary | ICD-10-CM

## 2019-05-04 DIAGNOSIS — Z6823 Body mass index (BMI) 23.0-23.9, adult: Secondary | ICD-10-CM

## 2019-05-04 DIAGNOSIS — C7A1 Malignant poorly differentiated neuroendocrine tumors: Secondary | ICD-10-CM | POA: Diagnosis present

## 2019-05-04 DIAGNOSIS — Z20828 Contact with and (suspected) exposure to other viral communicable diseases: Secondary | ICD-10-CM | POA: Diagnosis present

## 2019-05-04 DIAGNOSIS — Z79899 Other long term (current) drug therapy: Secondary | ICD-10-CM

## 2019-05-04 DIAGNOSIS — T451X5A Adverse effect of antineoplastic and immunosuppressive drugs, initial encounter: Secondary | ICD-10-CM | POA: Diagnosis present

## 2019-05-04 DIAGNOSIS — Z79890 Hormone replacement therapy (postmenopausal): Secondary | ICD-10-CM

## 2019-05-04 DIAGNOSIS — R0781 Pleurodynia: Secondary | ICD-10-CM | POA: Diagnosis not present

## 2019-05-04 DIAGNOSIS — R112 Nausea with vomiting, unspecified: Secondary | ICD-10-CM | POA: Diagnosis present

## 2019-05-04 DIAGNOSIS — E876 Hypokalemia: Secondary | ICD-10-CM | POA: Diagnosis not present

## 2019-05-04 DIAGNOSIS — Z808 Family history of malignant neoplasm of other organs or systems: Secondary | ICD-10-CM

## 2019-05-04 DIAGNOSIS — C7A8 Other malignant neuroendocrine tumors: Secondary | ICD-10-CM | POA: Diagnosis not present

## 2019-05-04 DIAGNOSIS — Z95828 Presence of other vascular implants and grafts: Secondary | ICD-10-CM | POA: Diagnosis not present

## 2019-05-04 DIAGNOSIS — C799 Secondary malignant neoplasm of unspecified site: Secondary | ICD-10-CM

## 2019-05-04 DIAGNOSIS — L271 Localized skin eruption due to drugs and medicaments taken internally: Secondary | ICD-10-CM | POA: Diagnosis present

## 2019-05-04 DIAGNOSIS — Z87891 Personal history of nicotine dependence: Secondary | ICD-10-CM

## 2019-05-04 DIAGNOSIS — R627 Adult failure to thrive: Secondary | ICD-10-CM | POA: Diagnosis not present

## 2019-05-04 DIAGNOSIS — G893 Neoplasm related pain (acute) (chronic): Secondary | ICD-10-CM | POA: Diagnosis present

## 2019-05-04 DIAGNOSIS — E860 Dehydration: Principal | ICD-10-CM | POA: Diagnosis present

## 2019-05-04 DIAGNOSIS — Z91041 Radiographic dye allergy status: Secondary | ICD-10-CM

## 2019-05-04 DIAGNOSIS — D539 Nutritional anemia, unspecified: Secondary | ICD-10-CM | POA: Diagnosis present

## 2019-05-04 DIAGNOSIS — C3490 Malignant neoplasm of unspecified part of unspecified bronchus or lung: Secondary | ICD-10-CM | POA: Diagnosis not present

## 2019-05-04 DIAGNOSIS — Z03818 Encounter for observation for suspected exposure to other biological agents ruled out: Secondary | ICD-10-CM | POA: Diagnosis not present

## 2019-05-04 DIAGNOSIS — Z923 Personal history of irradiation: Secondary | ICD-10-CM

## 2019-05-04 DIAGNOSIS — T50Z95A Adverse effect of other vaccines and biological substances, initial encounter: Secondary | ICD-10-CM | POA: Diagnosis present

## 2019-05-04 DIAGNOSIS — Z86718 Personal history of other venous thrombosis and embolism: Secondary | ICD-10-CM

## 2019-05-04 DIAGNOSIS — Z7901 Long term (current) use of anticoagulants: Secondary | ICD-10-CM | POA: Diagnosis not present

## 2019-05-04 HISTORY — DX: Malignant neoplasm of bone and articular cartilage, unspecified: C41.9

## 2019-05-04 HISTORY — DX: Reserved for inherently not codable concepts without codable children: IMO0001

## 2019-05-04 HISTORY — DX: Malignant neoplasm of unspecified part of unspecified bronchus or lung: C34.90

## 2019-05-04 LAB — COMPREHENSIVE METABOLIC PANEL
ALT: 25 U/L (ref 0–44)
AST: 47 U/L — ABNORMAL HIGH (ref 15–41)
Albumin: 3.1 g/dL — ABNORMAL LOW (ref 3.5–5.0)
Alkaline Phosphatase: 110 U/L (ref 38–126)
Anion gap: 12 (ref 5–15)
BUN: 14 mg/dL (ref 8–23)
CO2: 23 mmol/L (ref 22–32)
Calcium: 8.7 mg/dL — ABNORMAL LOW (ref 8.9–10.3)
Chloride: 104 mmol/L (ref 98–111)
Creatinine, Ser: 0.8 mg/dL (ref 0.44–1.00)
GFR calc Af Amer: >60 mL/min (ref >60)
GFR calc non Af Amer: >60 mL/min (ref >60)
Glucose, Bld: 102 mg/dL — ABNORMAL HIGH (ref 70–99)
Potassium: 3.3 mmol/L — ABNORMAL LOW (ref 3.5–5.1)
Sodium: 139 mmol/L (ref 135–145)
Total Bilirubin: 0.6 mg/dL (ref 0.3–1.2)
Total Protein: 7.2 g/dL (ref 6.5–8.1)

## 2019-05-04 LAB — CBC WITH DIFFERENTIAL/PLATELET
Abs Immature Granulocytes: 0.04 10*3/uL (ref 0.00–0.07)
Basophils Absolute: 0 10*3/uL (ref 0.0–0.1)
Basophils Relative: 1 %
Eosinophils Absolute: 0 10*3/uL (ref 0.0–0.5)
Eosinophils Relative: 0 %
HCT: 32.9 % — ABNORMAL LOW (ref 36.0–46.0)
Hemoglobin: 10.8 g/dL — ABNORMAL LOW (ref 12.0–15.0)
Immature Granulocytes: 1 %
Lymphocytes Relative: 17 %
Lymphs Abs: 1 10*3/uL (ref 0.7–4.0)
MCH: 33.3 pg (ref 26.0–34.0)
MCHC: 32.8 g/dL (ref 30.0–36.0)
MCV: 101.5 fL — ABNORMAL HIGH (ref 80.0–100.0)
Monocytes Absolute: 0.9 10*3/uL (ref 0.1–1.0)
Monocytes Relative: 16 %
Neutro Abs: 3.8 10*3/uL (ref 1.7–7.7)
Neutrophils Relative %: 65 %
Platelets: 231 10*3/uL (ref 150–400)
RBC: 3.24 MIL/uL — ABNORMAL LOW (ref 3.87–5.11)
RDW: 20.1 % — ABNORMAL HIGH (ref 11.5–15.5)
WBC: 5.8 10*3/uL (ref 4.0–10.5)
nRBC: 0 % (ref 0.0–0.2)

## 2019-05-04 LAB — PROTIME-INR
INR: 1.8 — ABNORMAL HIGH (ref 0.8–1.2)
Prothrombin Time: 20.5 seconds — ABNORMAL HIGH (ref 11.4–15.2)

## 2019-05-04 LAB — TYPE AND SCREEN
ABO/RH(D): O POS
Antibody Screen: NEGATIVE

## 2019-05-04 LAB — TROPONIN I: Troponin I: <0.03 ng/mL (ref <0.03)

## 2019-05-04 LAB — PHOSPHORUS: Phosphorus: 2.7 mg/dL (ref 2.5–4.6)

## 2019-05-04 LAB — MAGNESIUM: Magnesium: 1.9 mg/dL (ref 1.7–2.4)

## 2019-05-04 MED ORDER — SODIUM CHLORIDE 0.9 % IV BOLUS
1000.0000 mL | Freq: Once | INTRAVENOUS | Status: AC
  Administered 2019-05-04: 1000 mL via INTRAVENOUS

## 2019-05-04 MED ORDER — WARFARIN - PHARMACIST DOSING INPATIENT: Freq: Every day | Status: DC

## 2019-05-04 MED ORDER — POTASSIUM CHLORIDE 10 MEQ/100ML IV SOLN
10.0000 meq | INTRAVENOUS | Status: AC
  Administered 2019-05-04 – 2019-05-05 (×2): 10 meq via INTRAVENOUS
  Filled 2019-05-04 (×2): qty 100

## 2019-05-04 MED ORDER — HEPARIN (PORCINE) 25000 UT/250ML-% IV SOLN
1000.0000 [IU]/h | INTRAVENOUS | Status: DC
  Administered 2019-05-05: 1000 [IU]/h via INTRAVENOUS
  Filled 2019-05-04: qty 250

## 2019-05-04 MED ORDER — MORPHINE SULFATE (PF) 4 MG/ML IV SOLN
4.0000 mg | Freq: Once | INTRAVENOUS | Status: AC
  Administered 2019-05-04: 4 mg via INTRAVENOUS
  Filled 2019-05-04: qty 1

## 2019-05-04 MED ORDER — WARFARIN SODIUM 7.5 MG PO TABS
7.5000 mg | ORAL_TABLET | Freq: Once | ORAL | Status: AC
  Administered 2019-05-05: 7.5 mg via ORAL
  Filled 2019-05-04: qty 1

## 2019-05-04 MED ORDER — POTASSIUM CHLORIDE 10 MEQ/100ML IV SOLN
10.0000 meq | INTRAVENOUS | Status: AC
  Administered 2019-05-04: 10 meq via INTRAVENOUS
  Filled 2019-05-04 (×2): qty 100

## 2019-05-04 MED ORDER — HEPARIN BOLUS VIA INFUSION
2000.0000 [IU] | Freq: Once | INTRAVENOUS | Status: AC
  Administered 2019-05-05: 2000 [IU] via INTRAVENOUS
  Filled 2019-05-04: qty 2000

## 2019-05-04 MED ORDER — SODIUM CHLORIDE 0.9 % IV SOLN
Freq: Once | INTRAVENOUS | Status: AC
  Administered 2019-05-04: 22:00:00 via INTRAVENOUS

## 2019-05-04 MED ORDER — ONDANSETRON HCL 4 MG/2ML IJ SOLN
4.0000 mg | Freq: Once | INTRAMUSCULAR | Status: AC
  Administered 2019-05-04: 4 mg via INTRAVENOUS
  Filled 2019-05-04: qty 2

## 2019-05-04 NOTE — ED Triage Notes (Signed)
She states "I have just completed Chemo. And now I'm only on biologics". She is here with c/o vomiting several times per day "for several days now". She is ambulatory, and is pale and weak in appearance. She is alert and oriented x 4.

## 2019-05-04 NOTE — Progress Notes (Signed)
ANTICOAGULATION CONSULT NOTE - Initial Consult  Pharmacy Consult for Heparin and coumadin Indication: DVT  Allergies  Allergen Reactions  . Barium Sulfate Diarrhea    Hypersensitivity intolerance reaction of stomach cramps and diarrhea.    Patient Measurements:   Heparin Dosing Weight: 61 kg  Vital Signs: Temp: 97.7 F (36.5 C) (06/20 1849) Temp Source: Oral (06/20 1849) BP: 131/77 (06/20 1849) Pulse Rate: 101 (06/20 1849)  Labs: Recent Labs    05/04/19 2023 05/04/19 2024  HGB 10.8*  --   HCT 32.9*  --   PLT 231  --   LABPROT 20.5*  --   INR 1.8*  --   CREATININE 0.80  --   TROPONINI  --  <0.03    Estimated Creatinine Clearance: 63 mL/min (by C-G formula based on SCr of 0.8 mg/dL).   Medical History: Past Medical History:  Diagnosis Date  . Arthritis   . Chronic kidney disease   . Dog bite of wrist   . History of kidney stones   . History of radiation therapy 10/23/18- 11/06/18   Left lower rib mass/ 30 Gy delivered in 10 fractions 3 Gy.   . Kidney infection   . Left renal mass   . Lung cancer ([place])   . Metastatic bone cancer ([place])   . Osteopenia   . Pyelonephritis   . Recurrent UTI (urinary tract infection)      Assessment: 68 yo F on coumadin for  Port-A-Cath related VTE.  Pharmacy consulted to dose heparin and coumadin.  She has been unable to keep down her meds  Including coumadin for 3-4 days due to N/V.   Home coumadin dose: 5 mg qday. Last dose 6/16 or 6/17 - pt unsure.  Admission INR 1.8.  Hg 10.8, PLTC 231.  No bleeding reported.    Goal of Therapy:  INR 2-3 Heparin level 0.3-0.7 units/ml Monitor platelets by anticoagulation protocol: Yes   Plan:  Heparin 2000 unit bolus and heparin drip at 1000 units/hr Coumadin 7.5 mg po x 1 dose Check 8 hr heparin level Daily INR, heparin level, CBC  Eudelia Bunch, Pharm.D [phone] 05/04/2019 11:29 PM

## 2019-05-04 NOTE — ED Provider Notes (Signed)
[facility] DEPT Provider Note   CSN: [id number] Arrival date & time: 05/04/19  1837     History   Chief Complaint Chief Complaint  Patient presents with  . Cancer  . Emesis    HPI Alison Sullivan is a 68 y.o. female.  She is a history of neuroendocrine tumor and follows with Dr. Earlie Server here from oncology.  She has been on chemotherapy but currently is biologic agents and is anticipated to be getting radiation to a rib lesion coming up soon.  She is complaining of 3 to 4 days of increased nausea vomiting and inability to hold down her pain medication, anticoagulation medication.  She is feeling progressively weaker.  She is having some left posterior rib pain from her known metastatic lesion.  She has a little bit of vague abdominal pain but she thinks she is just more sore from retching.  No fevers.  No cough shortness of breath.      The history is provided by the patient.  Emesis Severity:  Severe Timing:  Intermittent Quality:  Bilious material Progression:  Unchanged Chronicity:  Recurrent Relieved by:  Nothing Worsened by:  Liquids Ineffective treatments:  Ice chips Associated symptoms: no abdominal pain, no cough, no fever, no headaches and no sore throat   Risk factors: no sick contacts and no travel to endemic areas     Past Medical History:  Diagnosis Date  . Arthritis   . Chronic kidney disease   . Dog bite of wrist   . History of kidney stones   . History of radiation therapy 10/23/18- 11/06/18   Left lower rib mass/ 30 Gy delivered in 10 fractions 3 Gy.   . Kidney infection   . Left renal mass   . Lung cancer ([place])   . Metastatic bone cancer ([place])   . Osteopenia   . Pyelonephritis   . Recurrent UTI (urinary tract infection)     Patient Active Problem List   Diagnosis Date Noted  . Pancytopenia ([place]) 04/15/2019  . Neuroendocrine carcinoma of the left lung  02/24/2019  . Neutropenia ([place]) 02/24/2019  .  Diverticulitis of sigmoid colon 02/24/2019  . Hypokalemia 02/24/2019  . Thrombus port  A cath 02/24/2019  . Abscess of sigmoid colon due to diverticulitis 02/23/2019  . Long term current use of anticoagulant therapy 01/10/2019  . Port-A-Cath in place 12/20/2018  . Large cell carcinoma of left lung, stage 4 ([place]) 10/18/2018  . Encounter for antineoplastic immunotherapy 10/18/2018  . Bone metastases ([place]) 10/10/2018  . Bronchiectasis without complication ([place]) 40/98/1191  . COPD GOLD ? II  Still smoking 09/12/2018  . Cigarette smoker 09/12/2018  . Pulmonary nodule 09/11/2018  . Left-sided back pain 08/06/2018  . Rib pain on left side 08/06/2018  . Acute thoracic back pain 07/05/2018  . UTI symptoms 03/17/2018  . Closed nondisplaced fracture of lateral malleolus of left fibula 03/06/2018  . Pain in left ankle and joints of left foot 02/13/2018  . Right low back pain 02/09/2018  . Fall 02/09/2018  . Right ankle pain 02/09/2018  . Left ankle pain 02/09/2018  . Hyperlipidemia 01/10/2018  . Routine general medical examination at a health care facility 01/02/2015  . Insomnia 01/02/2015  . Nephrolithiasis 08/11/2013    Past Surgical History:  Procedure Laterality Date  . CYSTOSCOPY WITH RETROGRADE PYELOGRAM, URETEROSCOPY AND STENT PLACEMENT Left 12/28/2017   Procedure: CYSTOSCOPY WITH RETROGRADE PYELOGRAM, URETEROSCOPY AND STENT PLACEMENT, STONE BASKETRY;  Surgeon: Festus Aloe, MD;  Location: [facility];  Service: Urology;  Laterality: Left;  . FINGER SURGERY     index, growth removal  . HOLMIUM LASER APPLICATION Left 2/67/1245   Procedure: HOLMIUM LASER APPLICATION;  Surgeon: Festus Aloe, MD;  Location: [facility];  Service: Urology;  Laterality: Left;  . IR IMAGING GUIDED PORT INSERTION  10/31/2018  . KIDNEY STONE SURGERY  2011 or 2012  . LITHOTRIPSY  2007     OB History   No obstetric history on file.      Home Medications     Prior to Admission medications   Medication Sig Start Date End Date Taking? Authorizing Provider  ALPRAZolam (XANAX) 0.25 MG tablet Take 1 tablet (0.25 mg total) by mouth 2 (two) times daily as needed for anxiety. 04/22/19   Curt Bears, MD  bisacodyl (BISACODYL) 5 MG EC tablet Take 5 mg by mouth daily as needed for moderate constipation.    [provider]  folic acid (FOLVITE) 1 MG tablet Take 1 tablet (1 mg total) by mouth daily. Patient taking differently: Take 1 mg by mouth every evening.  12/10/18   Curt Bears, MD  gabapentin (NEURONTIN) 300 MG capsule Take 300 mg by mouth daily as needed (pain).    [provider]  Hydrocortisone (GERHARDT'S BUTT CREAM) CREA Apply 1 application topically daily. 04/19/19   Nita Sells, MD  hydrOXYzine (ATARAX/VISTARIL) 25 MG tablet Take 1 tablet (25 mg total) by mouth 3 (three) times daily as needed for nausea (use Zofran 1st). 04/19/19   Nita Sells, MD  lidocaine-prilocaine (EMLA) cream Apply 1 application topically as needed. Patient taking differently: Apply 1 application topically as needed (For port-a-cath.).  10/18/18   Curt Bears, MD  loperamide (IMODIUM) 2 MG capsule Take 1 capsule (2 mg total) by mouth as needed for diarrhea or loose stools. Patient not taking: Reported on 04/22/2019 04/19/19   Nita Sells, MD  magic mouthwash SOLN Take 5 mLs by mouth 4 (four) times daily as needed for mouth pain. Components - Hydrocortisone 60 mg, Nystatin suspension 30 ml , Benadryl 12.5mg  /5 ml QS 240 ml. Patient not taking: Reported on 04/22/2019 04/05/19   Curt Bears, MD  magnesium oxide (MAG-OX) 400 (241.3 Mg) MG tablet Take 1 tablet (400 mg total) by mouth 2 (two) times daily. 04/23/19   Curt Bears, MD  Melatonin 10 MG TABS Take 10 mg by mouth at bedtime.     [provider]  omeprazole (PRILOSEC) 40 MG capsule Take 1 capsule (40 mg total) by mouth daily. Patient not taking: Reported on  04/15/2019 02/13/19   Harle Stanford., PA-C  oxyCODONE (OXY IR/ROXICODONE) 5 MG immediate release tablet Take 1-2 tablets (5-10 mg total) by mouth every 4 (four) hours as needed for severe pain. 04/30/19   [name], MD  polyethylene glycol (MIRALAX / GLYCOLAX) 17 g packet Take 17 g by mouth daily as needed for mild constipation. Patient not taking: Reported on 04/22/2019 04/19/19   Nita Sells, MD  potassium chloride (K-DUR) 10 MEQ tablet Take 1 tablet (10 mEq total) by mouth 2 (two) times daily. 04/19/19   Nita Sells, MD  prochlorperazine (COMPAZINE) 10 MG tablet Take 1 tablet (10 mg total) by mouth every 6 (six) hours as needed for nausea or vomiting. Patient not taking: Reported on 04/23/2019 10/18/18   Curt Bears, MD  warfarin (COUMADIN) 5 MG tablet TAKE 1 TABLET(5 MG) BY MOUTH DAILY Patient taking differently: Take 2.5-5 mg by mouth every evening. Take  2.5mg  on Monday and Thursday and 5mg  the rest of the week. 12/21/18   Curt Bears, MD    Family History Family History  Problem Relation Age of Onset  . Cancer Father        pancreatic  . Melanoma Father     Social History Social History   Tobacco Use  . Smoking status: Former Smoker    Packs/day: 0.25    Years: 30.00    Pack years: 7.50    Types: Cigarettes  . Smokeless tobacco: Never Used  . Tobacco comment: She quit early December 2019  Substance Use Topics  . Alcohol use: Yes    Comment: occ  . Drug use: No     Allergies   Barium sulfate   Review of Systems Review of Systems  Constitutional: Negative for fever.  HENT: Negative for sore throat.   Eyes: Negative for visual disturbance.  Respiratory: Negative for cough and shortness of breath.   Cardiovascular: Positive for chest pain (rib pain).  Gastrointestinal: Positive for nausea and vomiting. Negative for abdominal pain.  Genitourinary: Negative for dysuria.  Musculoskeletal: Negative for neck pain.  Skin: Negative for rash.   Neurological: Negative for headaches.     Physical Exam Updated Vital Signs BP 131/77 (BP Location: Left Arm)   Pulse (!) 101   Temp 97.7 F (36.5 C) (Oral)   Resp 18   SpO2 94%   Physical Exam Vitals signs and nursing note reviewed.  Constitutional:      General: She is not in acute distress.    Appearance: She is underweight.  HENT:     Head: Normocephalic and atraumatic.  Eyes:     Conjunctiva/sclera: Conjunctivae normal.  Neck:     Musculoskeletal: Neck supple.  Cardiovascular:     Rate and Rhythm: Regular rhythm. Tachycardia present.     Heart sounds: No murmur.  Pulmonary:     Effort: Pulmonary effort is normal. No respiratory distress.     Breath sounds: Normal breath sounds.  Abdominal:     Palpations: Abdomen is soft.     Tenderness: There is no abdominal tenderness. There is no guarding or rebound.  Musculoskeletal: Normal range of motion.     Right lower leg: No edema.     Left lower leg: No edema.  Skin:    General: Skin is warm and dry.     Capillary Refill: Capillary refill takes less than 2 seconds.  Neurological:     General: No focal deficit present.     Mental Status: She is alert and oriented to person, place, and time.      ED Treatments / Results  Labs (all labs ordered are listed, but only abnormal results are displayed) Labs Reviewed  COMPREHENSIVE METABOLIC PANEL - Abnormal; Notable for the following components:      Result Value   Potassium 3.3 (*)    Glucose, Bld 102 (*)    Calcium 8.7 (*)    Albumin 3.1 (*)    AST 47 (*)    All other components within normal limits  CBC WITH DIFFERENTIAL/PLATELET - Abnormal; Notable for the following components:   RBC 3.24 (*)    Hemoglobin 10.8 (*)    HCT 32.9 (*)    MCV 101.5 (*)    RDW 20.1 (*)    All other components within normal limits  PROTIME-INR - Abnormal; Notable for the following components:   Prothrombin Time 20.5 (*)    INR 1.8 (*)  All other components within normal  limits  NOVEL CORONAVIRUS, NAA (HOSPITAL ORDER, SEND-OUT TO REF LAB)  MAGNESIUM  PHOSPHORUS  TROPONIN I  URINALYSIS, ROUTINE W REFLEX MICROSCOPIC  TYPE AND SCREEN    EKG EKG Interpretation  Date/Time:  Saturday May 04 2019 19:39:03 EDT Ventricular Rate:  96 PR Interval:    QRS Duration: 85 QT Interval:  377 QTC Calculation: 477 R Axis:   53 Text Interpretation:  Sinus rhythm Abnormal T, consider ischemia, anterior leads t wave abnormalities more pronounced than prior 2023/03/29 Confirmed by Aletta Edouard [phone]) on 05/04/2019 8:14:32 PM   Radiology No results found.  Procedures Procedures (including critical care time)  Medications Ordered in ED Medications  potassium chloride 10 mEq in 100 mL IVPB (has no administration in time range)  morphine 4 MG/ML injection 4 mg (4 mg Intravenous Given 05/04/19 2028)  sodium chloride 0.9 % bolus 1,000 mL (1,000 mLs Intravenous New Bag/Given 05/04/19 2031)  ondansetron (ZOFRAN) injection 4 mg (4 mg Intravenous Given 05/04/19 2028)     Initial Impression / Assessment and Plan / ED Course  I have reviewed the triage vital signs and the nursing notes.  Pertinent labs & imaging results that were available during my care of the patient were reviewed by me and considered in my medical decision making (see chart for details).  Clinical Course as of May 03 2053  Sat May 03, 6856  4520 68 year old female with metastatic cancer here with nausea vomiting and inability to hold down her pain medication and her anticoagulation medication.  Differential diagnosis includes metabolic derangement, obstruction, gastroenteritis, medication side effect   [MB]  2018 ECG shows normal sinus rhythm rate of 96 T wave inversions with some slight ST depression anteriorly more pronounced than prior EKG 03/29/23.   [MB]    Clinical Course User Index [MB] Hayden Rasmussen, MD   Alison Sullivan was evaluated in Emergency Department on 05/04/2019 for the symptoms  described in the history of present illness. She was evaluated in the context of the global COVID-19 pandemic, which necessitated consideration that the patient might be at risk for infection with the SARS-CoV-2 virus that causes COVID-19. Institutional protocols and algorithms that pertain to the evaluation of patients at risk for COVID-19 are in a state of rapid change based on information released by regulatory bodies including the CDC and federal and state organizations. These policies and algorithms were followed during the patient's care in the ED.      Final Clinical Impressions(s) / ED Diagnoses   Final diagnoses:  Metastatic cancer ([place])  Nausea and vomiting, intractability of vomiting not specified, unspecified vomiting type  Hypokalemia    ED Discharge Orders    None       Hayden Rasmussen, MD 05/05/19 [phone]

## 2019-05-04 NOTE — H&P (Signed)
[facility] History and Physical  Alison Sullivan NLG:[id number] DOB: 01/11/1951 DOA: 05/04/2019  Referring physician: Dr Carin Hock PCP: [name], MD   Chief Complaint: N/V, gen weakness  HPI: LAIA WILEY is a 68 y.o. 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  She progressed on 1st round of chemoRx , second line Rx took 6 cycles and is on maint Rx w/ alimta and Keytruda.  She presents with nausea/ vomiting for 3-4 days, gen'd weakness.  Can't keep her meds down including her coumadin (for DVT related to her port). Asked to see for admission.   Patient has been having N/V for last 3-4 days, can't keep her medications down, to weak to get and having troubles taking care of herself.  She lives alone, her only child is her son who 1 week ago graduated from [employer] and got a job in [place] and just moved up there this week.  She has an aide who comes in weekly on Wednesday's to help w/ chores.  She states "after this I think I am going to need her to come over more often".    No abd pain, no fevers, chills, SOB. Having some skin irritation and mouth irritation shes states they are attributing to the oral chemo/ immunoRx.  Takes atarax for the itching.  She has pain issues w/ spine and ribs and takes oxycodone and neurontin for this.  She hasn't been able to keep any of her chemo meds / pain meds/ nausea meds or coumadin due to the N/V.  She is very weak and having troubles taking her of herself at home.   Stage IV (T1b,N2, 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.. presented with left upper lobe lung nodule + mediastinal lymphadenopathy as well as right upper lobe as well as metastatic bone disease to the 6th and 10th rib.  She took systemic chemoRx carbo/ etoposide/ Tecentriq every 3 wks, had 2 cycles then  dc'd due to disease progression. Palliative XRT to T120 vertebral body bone lesions f/b Dr Isidore Moos. Current Rx is systemic chemoRX w/ carbo + alimta and Keytruda q 3wks started early Feb 2020, sp 6 cycles, now on maintenance Alimta and Keytruda.   Recent admits:  April 2020 - admit for abd pain , found to have acute diverticulitis in prox sigmoid colon w/ intramural abscess in wall of sigmoid.  Rx'd IV zosyn , seen by Gen surg no surg needed.  dc'd on po avelox for 10d.  Low K and Low Na, corrected. dc'd on 40 meq per day x 4d of KCl. Anemia Hb 8.8.  Suspicious nonocclusive thrombus at the tip of the port-a-cath, started on coumadin.   June 1- 5, 2020 - admitted for cachexia, anemia, pancytopenia, rib pain, hypoMg and hypokalemia and diarrhea. Improved. dc'd home.    ROS  denies CP  no joint pain   no HA  no blurry vision  no dysuria  no difficulty voiding  no change in urine color    Past Medical History  Past Medical History:  Diagnosis Date  . Arthritis   . Chronic kidney disease   . Dog bite of wrist   . History of kidney stones   . History of radiation therapy 10/23/18- 11/06/18   Left lower rib mass/ 30 Gy delivered in 10 fractions 3 Gy.   . Kidney  infection   . Left renal mass   . Lung cancer ([place])   . Metastatic bone cancer ([place])   . Osteopenia   . Pyelonephritis   . Recurrent UTI (urinary tract infection)    Past Surgical History  Past Surgical History:  Procedure Laterality Date  . CYSTOSCOPY WITH RETROGRADE PYELOGRAM, URETEROSCOPY AND STENT PLACEMENT Left 12/28/2017   Procedure: CYSTOSCOPY WITH RETROGRADE PYELOGRAM, URETEROSCOPY AND STENT PLACEMENT, STONE BASKETRY;  Surgeon: Festus Aloe, MD;  Location: [facility];  Service: Urology;  Laterality: Left;  . FINGER SURGERY     index, growth removal  . HOLMIUM LASER APPLICATION Left 3/38/2505   Procedure: HOLMIUM LASER APPLICATION;  Surgeon: Festus Aloe, MD;  Location: [facility];  Service: Urology;  Laterality: Left;  . IR IMAGING GUIDED PORT INSERTION  10/31/2018  . KIDNEY STONE SURGERY  2011 or 2012  . LITHOTRIPSY  2007   Family History  Family History  Problem Relation Age of Onset  . Cancer Father        pancreatic  . Melanoma Father    Social History  reports that she has quit smoking. Her smoking use included cigarettes. She has a 7.50 pack-year smoking history. She has never used smokeless tobacco. She reports current alcohol use. She reports that she does not use drugs. Allergies  Allergies  Allergen Reactions  . Barium Sulfate Diarrhea    Hypersensitivity intolerance reaction of stomach cramps and diarrhea.   Home medications Prior to Admission medications   Medication Sig Start Date End Date Taking? Authorizing Provider  ALPRAZolam (XANAX) 0.25 MG tablet Take 1 tablet (0.25 mg total) by mouth 2 (two) times daily as needed for anxiety. 04/22/19   Curt Bears, MD  bisacodyl (BISACODYL) 5 MG EC tablet Take 5 mg by mouth daily as needed for moderate constipation.    [provider]  folic acid (FOLVITE) 1 MG tablet Take 1 tablet (1 mg total) by mouth daily. Patient taking differently: Take 1 mg by mouth every evening.  12/10/18   Curt Bears, MD  gabapentin (NEURONTIN) 300 MG capsule Take 300 mg by mouth daily as needed (pain).    [provider]  Hydrocortisone (GERHARDT'S BUTT CREAM) CREA Apply 1 application topically daily. 04/19/19   Nita Sells, MD  hydrOXYzine (ATARAX/VISTARIL) 25 MG tablet Take 1 tablet (25 mg total) by mouth 3 (three) times daily as needed for nausea (use Zofran 1st). 04/19/19   Nita Sells, MD  lidocaine-prilocaine (EMLA) cream Apply 1 application topically as needed. Patient taking differently: Apply 1 application topically as needed (For port-a-cath.).  10/18/18   Curt Bears, MD  loperamide (IMODIUM) 2 MG capsule Take 1 capsule (2 mg total) by mouth as needed for diarrhea or  loose stools. Patient not taking: Reported on 04/22/2019 04/19/19   Nita Sells, MD  magic mouthwash SOLN Take 5 mLs by mouth 4 (four) times daily as needed for mouth pain. Components - Hydrocortisone 60 mg, Nystatin suspension 30 ml , Benadryl 12.5mg  /5 ml QS 240 ml. Patient not taking: Reported on 04/22/2019 04/05/19   Curt Bears, MD  magnesium oxide (MAG-OX) 400 (241.3 Mg) MG tablet Take 1 tablet (400 mg total) by mouth 2 (two) times daily. 04/23/19   Curt Bears, MD  Melatonin 10 MG TABS Take 10 mg by mouth at bedtime.     [provider]  omeprazole (PRILOSEC) 40 MG capsule Take 1 capsule (40 mg total) by mouth daily. Patient not taking: Reported on 04/15/2019  02/13/19   Tanner, Lyndon Code., PA-C  oxyCODONE (OXY IR/ROXICODONE) 5 MG immediate release tablet Take 1-2 tablets (5-10 mg total) by mouth every 4 (four) hours as needed for severe pain. 04/30/19   [name], MD  polyethylene glycol (MIRALAX / GLYCOLAX) 17 g packet Take 17 g by mouth daily as needed for mild constipation. Patient not taking: Reported on 04/22/2019 04/19/19   Nita Sells, MD  potassium chloride (K-DUR) 10 MEQ tablet Take 1 tablet (10 mEq total) by mouth 2 (two) times daily. 04/19/19   Nita Sells, MD  prochlorperazine (COMPAZINE) 10 MG tablet Take 1 tablet (10 mg total) by mouth every 6 (six) hours as needed for nausea or vomiting. Patient not taking: Reported on 04/23/2019 10/18/18   Curt Bears, MD  warfarin (COUMADIN) 5 MG tablet TAKE 1 TABLET(5 MG) BY MOUTH DAILY Patient taking differently: Take 2.5-5 mg by mouth every evening. Take 2.5mg  on Monday and Thursday and 5mg  the rest of the week. 12/21/18   Curt Bears, MD   Liver Function Tests Recent Labs  Lab 05/04/19 2023  AST 47*  ALT 25  ALKPHOS 110  BILITOT 0.6  PROT 7.2  ALBUMIN 3.1*   No results for input(s): LIPASE, AMYLASE in the last 168 hours. CBC Recent Labs  Lab 05/04/19 2023  WBC 5.8  NEUTROABS 3.8  HGB 10.8*   HCT 32.9*  MCV 101.5*  PLT 585   Basic Metabolic Panel Recent Labs  Lab 05/04/19 2023  NA 139  K 3.3*  CL 104  CO2 23  GLUCOSE 102*  BUN 14  CREATININE 0.80  CALCIUM 8.7*  PHOS 2.7     Vitals:   05/04/19 1849  BP: 131/77  Pulse: (!) 101  Resp: 18  Temp: 97.7 F (36.5 C)  TempSrc: Oral  SpO2: 94%   Exam: Gen alert, no distress, sig weak in appearacne but pleasant and Ox 3 No rash, cyanosis or gangrene Sclera anicteric, throat clear and dry  No jvd or bruits Chest clear bilat to bases, port-a-cath w/o erythema/ I nduration RRR no MRG Abd soft ntnd no mass or ascites +bs GU defer MS no joint effusions or deformity Ext on LE edema / no wounds or ulcers Neuro is alert, Ox 3 , nf, gen weakness , mild-mod, did not try to ambulate     Home meds:  - alprazolam 0.25 bid prn/ gabapentin 300 qd prn/ emla cream prn/ loperamide prn/ magic mouthwash prn/ oxycodone IR 5- 10 every 4h prn/ prochlorperazine 10mg  qid prn  - Kdur 10 bid/ omeprazole 40 qd  - prn's/ vitamins/ supplements    Na 139 K 3.3 CO2 23  BN 14  Cr 0.8   Phos 2.7  Mg 1.9  Alb 3.1  LFT"s ok trop <0.03    WBC 5.8  Hb 10.8  plt 231  INR 1.8  Glu 102     CXR 04/24/19 L unilateral ribs >  FINDINGS:  LEFT tenth rib metastasis better seen on CT, permeative changes, likely pathologic fracture. LEFT pleural effusion may be mildly increased. Sclerosis RIGHT T10 vertebral body, likely tumor. Port-A-Cath. BILATERAL lung pulmonary nodules probably unchanged when correlated with prior CT.   Assessment: 1. Nausea/ vomiting - due to immunotherapy vs other. Symptomatic Rx 2. Dehydration 3. Stage IV neuroendocrine Ca - aggressive features, has had 2nd opinion at [facility]. F/b Dr Julien Nordmann here.  IV pain meds prn for bony mets mostly 4. FTT 5. H/o possible DVT of port-a-cath > will start on IV  heparin, cont po warfarin and try to get INR up if pt can keep pills down   Plan -  As above   DVT prophylaxis: IV heparin Family  communication: none here, pt lives alone, son in [place] Code status: Full Admit status: OBV Bed type: Med surg     Kelly Splinter MD  pgr [phone] 05/04/2019, 9:45 PM  If 7PM-7AM, please contact night-coverage [email or web address] Password [facility] 05/04/2019, 9:45 PM

## 2019-05-05 DIAGNOSIS — Z87891 Personal history of nicotine dependence: Secondary | ICD-10-CM | POA: Diagnosis not present

## 2019-05-05 DIAGNOSIS — C7A1 Malignant poorly differentiated neuroendocrine tumors: Secondary | ICD-10-CM | POA: Diagnosis present

## 2019-05-05 DIAGNOSIS — R112 Nausea with vomiting, unspecified: Secondary | ICD-10-CM | POA: Diagnosis present

## 2019-05-05 DIAGNOSIS — R0781 Pleurodynia: Secondary | ICD-10-CM

## 2019-05-05 DIAGNOSIS — Z86718 Personal history of other venous thrombosis and embolism: Secondary | ICD-10-CM | POA: Diagnosis not present

## 2019-05-05 DIAGNOSIS — E860 Dehydration: Secondary | ICD-10-CM | POA: Diagnosis not present

## 2019-05-05 DIAGNOSIS — C7951 Secondary malignant neoplasm of bone: Secondary | ICD-10-CM | POA: Diagnosis not present

## 2019-05-05 DIAGNOSIS — Z20828 Contact with and (suspected) exposure to other viral communicable diseases: Secondary | ICD-10-CM | POA: Diagnosis present

## 2019-05-05 DIAGNOSIS — Z800 Family history of malignant neoplasm of digestive organs: Secondary | ICD-10-CM | POA: Diagnosis not present

## 2019-05-05 DIAGNOSIS — Z808 Family history of malignant neoplasm of other organs or systems: Secondary | ICD-10-CM | POA: Diagnosis not present

## 2019-05-05 DIAGNOSIS — T451X5A Adverse effect of antineoplastic and immunosuppressive drugs, initial encounter: Secondary | ICD-10-CM | POA: Diagnosis present

## 2019-05-05 DIAGNOSIS — Z923 Personal history of irradiation: Secondary | ICD-10-CM | POA: Diagnosis not present

## 2019-05-05 DIAGNOSIS — E876 Hypokalemia: Secondary | ICD-10-CM | POA: Diagnosis present

## 2019-05-05 DIAGNOSIS — D539 Nutritional anemia, unspecified: Secondary | ICD-10-CM | POA: Diagnosis not present

## 2019-05-05 DIAGNOSIS — Z7901 Long term (current) use of anticoagulants: Secondary | ICD-10-CM

## 2019-05-05 DIAGNOSIS — C7A8 Other malignant neuroendocrine tumors: Secondary | ICD-10-CM

## 2019-05-05 DIAGNOSIS — Z95828 Presence of other vascular implants and grafts: Secondary | ICD-10-CM | POA: Diagnosis not present

## 2019-05-05 DIAGNOSIS — L271 Localized skin eruption due to drugs and medicaments taken internally: Secondary | ICD-10-CM | POA: Diagnosis present

## 2019-05-05 DIAGNOSIS — Z6823 Body mass index (BMI) 23.0-23.9, adult: Secondary | ICD-10-CM | POA: Diagnosis not present

## 2019-05-05 DIAGNOSIS — Z9221 Personal history of antineoplastic chemotherapy: Secondary | ICD-10-CM | POA: Diagnosis not present

## 2019-05-05 DIAGNOSIS — T50Z95A Adverse effect of other vaccines and biological substances, initial encounter: Secondary | ICD-10-CM | POA: Diagnosis present

## 2019-05-05 DIAGNOSIS — M8580 Other specified disorders of bone density and structure, unspecified site: Secondary | ICD-10-CM | POA: Diagnosis present

## 2019-05-05 DIAGNOSIS — R627 Adult failure to thrive: Secondary | ICD-10-CM | POA: Diagnosis present

## 2019-05-05 DIAGNOSIS — G893 Neoplasm related pain (acute) (chronic): Secondary | ICD-10-CM | POA: Diagnosis present

## 2019-05-05 LAB — COMPREHENSIVE METABOLIC PANEL
ALT: 20 U/L (ref 0–44)
AST: 39 U/L (ref 15–41)
Albumin: 2.5 g/dL — ABNORMAL LOW (ref 3.5–5.0)
Alkaline Phosphatase: 91 U/L (ref 38–126)
Anion gap: 7 (ref 5–15)
BUN: 10 mg/dL (ref 8–23)
CO2: 22 mmol/L (ref 22–32)
Calcium: 7.5 mg/dL — ABNORMAL LOW (ref 8.9–10.3)
Chloride: 112 mmol/L — ABNORMAL HIGH (ref 98–111)
Creatinine, Ser: 0.7 mg/dL (ref 0.44–1.00)
GFR calc Af Amer: >60 mL/min (ref >60)
GFR calc non Af Amer: >60 mL/min (ref >60)
Glucose, Bld: 89 mg/dL (ref 70–99)
Potassium: 3.8 mmol/L (ref 3.5–5.1)
Sodium: 141 mmol/L (ref 135–145)
Total Bilirubin: 0.7 mg/dL (ref 0.3–1.2)
Total Protein: 5.9 g/dL — ABNORMAL LOW (ref 6.5–8.1)

## 2019-05-05 LAB — URINALYSIS, ROUTINE W REFLEX MICROSCOPIC
Bilirubin Urine: NEGATIVE
Glucose, UA: NEGATIVE mg/dL
Hgb urine dipstick: NEGATIVE
Ketones, ur: 5 mg/dL — AB
Leukocytes,Ua: NEGATIVE
Nitrite: NEGATIVE
Protein, ur: NEGATIVE mg/dL
Specific Gravity, Urine: 1.006 (ref 1.005–1.030)
pH: 7 (ref 5.0–8.0)

## 2019-05-05 LAB — CBC WITH DIFFERENTIAL/PLATELET
Abs Immature Granulocytes: 0.03 10*3/uL (ref 0.00–0.07)
Basophils Absolute: 0 10*3/uL (ref 0.0–0.1)
Basophils Relative: 1 %
Eosinophils Absolute: 0.1 10*3/uL (ref 0.0–0.5)
Eosinophils Relative: 1 %
HCT: 30.2 % — ABNORMAL LOW (ref 36.0–46.0)
Hemoglobin: 9.3 g/dL — ABNORMAL LOW (ref 12.0–15.0)
Immature Granulocytes: 1 %
Lymphocytes Relative: 20 %
Lymphs Abs: 0.9 10*3/uL (ref 0.7–4.0)
MCH: 32.3 pg (ref 26.0–34.0)
MCHC: 30.8 g/dL (ref 30.0–36.0)
MCV: 104.9 fL — ABNORMAL HIGH (ref 80.0–100.0)
Monocytes Absolute: 0.9 10*3/uL (ref 0.1–1.0)
Monocytes Relative: 18 %
Neutro Abs: 2.8 10*3/uL (ref 1.7–7.7)
Neutrophils Relative %: 59 %
Platelets: 206 10*3/uL (ref 150–400)
RBC: 2.88 MIL/uL — ABNORMAL LOW (ref 3.87–5.11)
RDW: 20.2 % — ABNORMAL HIGH (ref 11.5–15.5)
WBC: 4.6 10*3/uL (ref 4.0–10.5)
nRBC: 0 % (ref 0.0–0.2)

## 2019-05-05 LAB — PROTIME-INR
INR: 2 — ABNORMAL HIGH (ref 0.8–1.2)
Prothrombin Time: 22.7 seconds — ABNORMAL HIGH (ref 11.4–15.2)

## 2019-05-05 LAB — MAGNESIUM: Magnesium: 1.5 mg/dL — ABNORMAL LOW (ref 1.7–2.4)

## 2019-05-05 LAB — PHOSPHORUS: Phosphorus: 2.5 mg/dL (ref 2.5–4.6)

## 2019-05-05 LAB — HEPARIN LEVEL (UNFRACTIONATED)
Heparin Unfractionated: 0.58 IU/mL (ref 0.30–0.70)
Heparin Unfractionated: 0.78 IU/mL — ABNORMAL HIGH (ref 0.30–0.70)

## 2019-05-05 MED ORDER — MAGNESIUM SULFATE 50 % IJ SOLN
3.0000 g | Freq: Once | INTRAVENOUS | Status: AC
  Administered 2019-05-05: 3 g via INTRAVENOUS
  Filled 2019-05-05: qty 6

## 2019-05-05 MED ORDER — ORAL CARE MOUTH RINSE: 15.0000 mL | Freq: Two times a day (BID) | OROMUCOSAL | Status: DC

## 2019-05-05 MED ORDER — GERHARDT'S BUTT CREAM: 1.0000 "application " | TOPICAL_CREAM | Freq: Every day | CUTANEOUS | Status: DC | PRN

## 2019-05-05 MED ORDER — MORPHINE SULFATE (PF) 2 MG/ML IV SOLN
2.0000 mg | INTRAVENOUS | Status: DC | PRN
  Administered 2019-05-05: 2 mg via INTRAVENOUS
  Administered 2019-05-05: 4 mg via INTRAVENOUS
  Administered 2019-05-05 (×2): 2 mg via INTRAVENOUS
  Administered 2019-05-06 – 2019-05-07 (×5): 3 mg via INTRAVENOUS
  Administered 2019-05-07: 4 mg via INTRAVENOUS
  Filled 2019-05-05 (×2): qty 2
  Filled 2019-05-05: qty 1
  Filled 2019-05-05 (×2): qty 2
  Filled 2019-05-05: qty 1
  Filled 2019-05-05 (×4): qty 2

## 2019-05-05 MED ORDER — LIDOCAINE-PRILOCAINE 2.5-2.5 % EX CREA
1.0000 "application " | TOPICAL_CREAM | CUTANEOUS | Status: DC | PRN
  Filled 2019-05-05: qty 5

## 2019-05-05 MED ORDER — WARFARIN SODIUM 5 MG PO TABS
5.0000 mg | ORAL_TABLET | Freq: Once | ORAL | Status: AC
  Administered 2019-05-05: 5 mg via ORAL
  Filled 2019-05-05: qty 1

## 2019-05-05 MED ORDER — PANTOPRAZOLE SODIUM 40 MG PO TBEC
40.0000 mg | DELAYED_RELEASE_TABLET | Freq: Every day | ORAL | Status: DC
  Administered 2019-05-05 – 2019-05-07 (×3): 40 mg via ORAL
  Filled 2019-05-05 (×3): qty 1

## 2019-05-05 MED ORDER — ACETAMINOPHEN 325 MG PO TABS: 650.0000 mg | ORAL_TABLET | Freq: Four times a day (QID) | ORAL | Status: DC | PRN

## 2019-05-05 MED ORDER — POTASSIUM CHLORIDE 10 MEQ/100ML IV SOLN
10.0000 meq | Freq: Once | INTRAVENOUS | Status: AC
  Administered 2019-05-05: 10 meq via INTRAVENOUS

## 2019-05-05 MED ORDER — SODIUM CHLORIDE 0.9 % IV BOLUS
2000.0000 mL | Freq: Once | INTRAVENOUS | Status: AC
  Administered 2019-05-05: 2000 mL via INTRAVENOUS

## 2019-05-05 MED ORDER — MAGIC MOUTHWASH
5.0000 mL | Freq: Four times a day (QID) | ORAL | Status: DC | PRN
  Filled 2019-05-05: qty 5

## 2019-05-05 MED ORDER — SODIUM CHLORIDE 0.9% FLUSH: 10.0000 mL | INTRAVENOUS | Status: DC | PRN

## 2019-05-05 MED ORDER — GABAPENTIN 300 MG PO CAPS: 300.0000 mg | ORAL_CAPSULE | Freq: Every day | ORAL | Status: DC | PRN

## 2019-05-05 MED ORDER — ALPRAZOLAM 0.25 MG PO TABS: 0.2500 mg | ORAL_TABLET | Freq: Two times a day (BID) | ORAL | Status: DC | PRN

## 2019-05-05 MED ORDER — WARFARIN SODIUM 5 MG PO TABS: 5.0000 mg | ORAL_TABLET | Freq: Every evening | ORAL | Status: DC

## 2019-05-05 MED ORDER — HEPARIN (PORCINE) 25000 UT/250ML-% IV SOLN
900.0000 [IU]/h | INTRAVENOUS | Status: DC
  Administered 2019-05-05: 900 [IU]/h via INTRAVENOUS
  Filled 2019-05-05: qty 250

## 2019-05-05 MED ORDER — SODIUM CHLORIDE 0.9 % IV SOLN
INTRAVENOUS | Status: DC
  Administered 2019-05-05 – 2019-05-06 (×2): via INTRAVENOUS

## 2019-05-05 MED ORDER — OXYCODONE HCL 5 MG PO TABS: 5.0000 mg | ORAL_TABLET | ORAL | Status: DC | PRN

## 2019-05-05 MED ORDER — ACETAMINOPHEN 650 MG RE SUPP: 650.0000 mg | Freq: Four times a day (QID) | RECTAL | Status: DC | PRN

## 2019-05-05 MED ORDER — PROCHLORPERAZINE EDISYLATE 10 MG/2ML IJ SOLN
10.0000 mg | INTRAMUSCULAR | Status: DC | PRN
  Administered 2019-05-06: 10 mg via INTRAVENOUS
  Filled 2019-05-05: qty 2

## 2019-05-05 MED ORDER — BISACODYL 10 MG RE SUPP: 10.0000 mg | Freq: Every day | RECTAL | Status: DC | PRN

## 2019-05-05 MED ORDER — FOLIC ACID 1 MG PO TABS
1.0000 mg | ORAL_TABLET | Freq: Every day | ORAL | Status: DC
  Administered 2019-05-05 – 2019-05-07 (×3): 1 mg via ORAL
  Filled 2019-05-05 (×3): qty 1

## 2019-05-05 NOTE — Progress Notes (Signed)
PROGRESS NOTE    Alison Sullivan  BOF:[id number] DOB: Nov 21, 1950 DOA: 05/04/2019 PCP: [name], MD   Brief Narrative:  HPI per Dr. Roney Jaffe on 05/04/2019 Alison Sullivan is a 68 y.o. 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  She progressed on 1st round of chemoRx , second line Rx took 6 cycles and is on maint Rx w/ alimta and Keytruda.  She presents with nausea/ vomiting for 3-4 days, gen'd weakness.  Can't keep her meds down including her coumadin (for DVT related to her port). Asked to see for admission.   Patient has been having N/V for last 3-4 days, can't keep her medications down, to weak to get and having troubles taking care of herself.  She lives alone, her only child is her son who 1 week ago graduated from [employer] and got a job in [place] and just moved up there this week.  She has an aide who comes in weekly on Wednesday's to help w/ chores.  She states "after this I think I am going to need her to come over more often".    No abd pain, no fevers, chills, SOB. Having some skin irritation and mouth irritation shes states they are attributing to the oral chemo/ immunoRx.  Takes atarax for the itching.  She has pain issues w/ spine and ribs and takes oxycodone and neurontin for this.  She hasn't been able to keep any of her chemo meds / pain meds/ nausea meds or coumadin due to the N/V.  She is very weak and having troubles taking her of herself at home.   Stage IV (T1b,N2, 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.. presented with left upper lobe lung nodule + mediastinal lymphadenopathy as well as right upper lobe as well as metastatic bone disease to the 6th and 10th rib.  She took systemic chemoRx carbo/ etoposide/ Tecentriq every 3 wks, had 2 cycles then dc'd due to disease progression.  Palliative XRT to T120 vertebral body bone lesions f/b Dr Isidore Moos. Current Rx is systemic chemoRX w/ carbo + alimta and Keytruda q 3wks started early Feb 2020, sp 6 cycles, now on maintenance Alimta and Keytruda.   Recent admits:  April 2020 - admit for abd pain , found to have acute diverticulitis in prox sigmoid colon w/ intramural abscess in wall of sigmoid.  Rx'd IV zosyn , seen by Gen surg no surg needed.  dc'd on po avelox for 10d.  Low K and Low Na, corrected. dc'd on 40 meq per day x 4d of KCl. Anemia Hb 8.8.  Suspicious nonocclusive thrombus at the tip of the port-a-cath, started on coumadin.   June 1- 5, 2020 - admitted for cachexia, anemia, pancytopenia, rib pain, hypoMg and hypokalemia and diarrhea. Improved. dc'd home.   **Interim History Patient's nausea and vomiting is improved and she is very hungry so diet was advanced to clear liquid diet.  We will continue symptomatic treatment and advance diet as tolerated and if patient is able to tolerate soft diet she will be discharged home in the a.m. we will obtain PT OT for further evaluation recommendations  Assessment & Plan:   Principal Problem:   Nausea & vomiting Active Problems:   Rib pain on left side   Bone metastases ([place])   Port-A-Cath in place   Long term current use  of anticoagulant therapy   Neuroendocrine carcinoma of the left lung, stage IV w/ lung nodules / mediastinal LAN and bony mets   Dehydration   Intractable nausea and vomiting  Nausea and Vomiting, improving -Due to immunotherapy vs other etiology -C/w Symptomatic Treatment and Supportive -C/w Prochlorperazine 10 mg q4hprn N/V -Clear Liquid Diet and will Advance as Tolerated   Dehydration -Improving. -IVF Rate decreased to 50 mL/hr now   Stage IV Neuroendocrine Cancer appearing carcinoma with a small amount of large cell features -Aggressive features, has had 2nd opinion at [facility].  -Has lung nodules with mediastinal lymphadenopathy and T10 and rib  involvement diagnosed in 2010 -Progressing first round of chemotherapy and secondary chemotherapy 6 cycles and is now on maintenance therapy with Alimta and Keytruda -F/b Dr Julien Nordmann here.  -C/w IV pain meds prn for bony mets mostly; C/w 2-4 mg IV q3hprn Moderate Pain and -C/w Lidocaine-Prilocaine 1 app Topically   FTT -Nutrition Counseling -Will obtain PT/OT -C/w IVF but reduce rate to 50 mL/hr and advance Diet as Tolerated   H/o possible DVT of port-a-cath  -Will start on IV heparin gtt,  -Cont po warfarin and try to get INR up if pt can keep pills down  -Last INR was 2.0  Macrocytic Anemia -Patient's Hb/Hct went from 10.8/32.9 -> 9.3/30.2 -Check Anemia Panel in the AM -Continue to Monitor for S/Sx of Bleeding; Currently no Overt Bleeding noted -Repeat CBC in AM  Hypomagnesemia -Patient's Mag Level this AM was 1.5 -Replete with IV Mag Sulfate 3 grams -Continue to Monitor and Replete as Necessary -Repeat Mag Level in AM   Hypo-kalemia Patient potassium level went from 3.3 is now 2.8 Continue monitor and replete as necessary -Magnesium levels 1.1 -Repeat CMP in a.m.  Abnormal and elevated AST -Improved and now AST is now 39 Continue monitor and trend CMP in a.m.  DVT prophylaxis: Anticoagulated with Heparin gtt Code Status: FULL CODE  Family Communication: No family present at bedside  Disposition Plan: Anticipate D/C Home in the Next 24-48 hours   Consultants:   None   Procedures:  None  Antimicrobials:  Anti-infectives (From admission, onward)   None     Subjective: Seen and examined at bedside she states that she was feeling better.  Denies chest pain, and is of dizziness.  No nausea or vomiting.  Wanting to eat something now.  No other concerns or complaints at this time.  Objective: Vitals:   05/05/19 0315 05/05/19 0330 05/05/19 0357 05/05/19 1336  BP:  128/79 137/75 126/77  Pulse: 87 88  79  Resp: 12 15 18 16   Temp:   99 F (37.2 C) 98.5 F (36.9  C)  TempSrc:   Oral Oral  SpO2: 93% 93% 97% 96%  Weight:   60.8 kg   Height:   5\' 6"  (1.676 m)     Intake/Output Summary (Last 24 hours) at 05/05/2019 1350 Last data filed at 05/05/2019 1332 Gross per 24 hour  Intake 290.01 ml  Output 1650 ml  Net -1359.99 ml   Filed Weights   05/05/19 0357  Weight: 60.8 kg   Examination: Physical Exam:  Constitutional: Thin chronically ill-appearing Caucasian female in, NAD and appears calm and comfortable Eyes: Lids and conjunctivae normal, sclerae anicteric  ENMT: External Ears, Nose appear normal. Grossly normal hearing.   Neck: Appears normal, supple, no cervical masses, normal ROM, no appreciable thyromegaly; no JVD Respiratory: Diminished to auscultation bilaterally, no wheezing, rales, rhonchi or crackles. Normal respiratory effort and patient  is not tachypenic. No accessory muscle use.  Cardiovascular: RRR, no murmurs / rubs / gallops. S1 and S2 auscultated.  Abdomen: Soft, non-tender, non-distended.  Bowel sounds positive x4.  GU: Deferred. Musculoskeletal: No clubbing / cyanosis of digits/nails. No joint deformity upper and lower extremities. Good ROM, no contractures. Normal strength and muscle tone.  Skin: No rashes, lesions, ulcers. No induration; Warm and dry.  Neurologic: CN 2-12 grossly intact with no focal deficits. Romberg sign and cerebellar reflexes not assessed.  Psychiatric: Normal judgment and insight. Alert and oriented x 3. Normal mood and appropriate affect.   Data Reviewed: I have personally reviewed following labs and imaging studies  CBC: Recent Labs  Lab 05/04/19 2023 05/05/19 0800  WBC 5.8 4.6  NEUTROABS 3.8 2.8  HGB 10.8* 9.3*  HCT 32.9* 30.2*  MCV 101.5* 104.9*  PLT 231 767   Basic Metabolic Panel: Recent Labs  Lab 05/04/19 2023 05/05/19 0800  NA 139 141  K 3.3* 3.8  CL 104 112*  CO2 23 22  GLUCOSE 102* 89  BUN 14 10  CREATININE 0.80 0.70  CALCIUM 8.7* 7.5*  MG 1.9 1.5*  PHOS 2.7 2.5    GFR: Estimated Creatinine Clearance: 63 mL/min (by C-G formula based on SCr of 0.7 mg/dL). Liver Function Tests: Recent Labs  Lab 05/04/19 2023 05/05/19 0800  AST 47* 39  ALT 25 20  ALKPHOS 110 91  BILITOT 0.6 0.7  PROT 7.2 5.9*  ALBUMIN 3.1* 2.5*   No results for input(s): LIPASE, AMYLASE in the last 168 hours. No results for input(s): AMMONIA in the last 168 hours. Coagulation Profile: Recent Labs  Lab 05/04/19 2023 05/05/19 0800  INR 1.8* 2.0*   Cardiac Enzymes: Recent Labs  Lab 05/04/19 2024  TROPONINI <0.03   BNP (last 3 results) No results for input(s): PROBNP in the last 8760 hours. HbA1C: No results for input(s): HGBA1C in the last 72 hours. CBG: No results for input(s): GLUCAP in the last 168 hours. Lipid Profile: No results for input(s): CHOL, HDL, LDLCALC, TRIG, CHOLHDL, LDLDIRECT in the last 72 hours. Thyroid Function Tests: No results for input(s): TSH, T4TOTAL, FREET4, T3FREE, THYROIDAB in the last 72 hours. Anemia Panel: No results for input(s): VITAMINB12, FOLATE, FERRITIN, TIBC, IRON, RETICCTPCT in the last 72 hours. Sepsis Labs: No results for input(s): PROCALCITON, LATICACIDVEN in the last 168 hours.  No results found for this or any previous visit (from the past 240 hour(s)).   Radiology Studies: No results found.  Scheduled Meds:  folic acid  1 mg Oral Daily   mouth rinse  15 mL Mouth Rinse BID   pantoprazole  40 mg Oral Daily   warfarin  5 mg Oral ONCE-1800   Warfarin - Pharmacist Dosing Inpatient   Does not apply q1800   Continuous Infusions:  sodium chloride 50 mL/hr at 05/05/19 0945   heparin 1,000 Units/hr (05/05/19 0004)   magnesium sulfate bolus IVPB      LOS: 0 days   Kerney Elbe, DO [facility] PAGER is on [place]  If 7PM-7AM, please contact night-coverage [email or web address] Password [email or web address] 05/05/2019, 1:50 PM

## 2019-05-05 NOTE — ED Notes (Signed)
ED TO INPATIENT HANDOFF REPORT  ED Nurse Name and Phone #: Raquel Sarna, RN  S Name/Age/Gender Alison Sullivan 68 y.o. female Room/Bed: WA22/WA22  Code Status   Code Status: Prior  Home/SNF/Other Home  Patient oriented to: self, place, time and situation Is this baseline? Yes   Triage Complete: Triage complete  Chief Complaint Weakness, Emesis, Dehydration  Triage Note She states "I have just completed Chemo. And now I'm only on biologics". She is here with c/o vomiting several times per day "for several days now". She is ambulatory, and is pale and weak in appearance. She is alert and oriented x 4.   Allergies Allergies  Allergen Reactions  . Barium Sulfate Diarrhea    Hypersensitivity intolerance reaction of stomach cramps and diarrhea.    Level of Care/Admitting Diagnosis ED Disposition    ED Disposition Condition Comment   Admit  Hospital Area: [facility] [[id number]]  Level of Care: Med-Surg [16]  Covid Evaluation: N/A  Diagnosis: Intractable nausea and vomiting [694854]  Admitting Physician: Davis, [place]  Attending Physician: Roney Jaffe [[id number]  PT Class (Do Not Modify): Observation [104]  PT Acc Code (Do Not Modify): Observation [10022]       B Medical/Surgery History Past Medical History:  Diagnosis Date  . Arthritis   . Chronic kidney disease   . Dog bite of wrist   . History of kidney stones   . History of radiation therapy 10/23/18- 11/06/18   Left lower rib mass/ 30 Gy delivered in 10 fractions 3 Gy.   . Kidney infection   . Left renal mass   . Lung cancer ([place])   . Metastatic bone cancer ([place])   . Osteopenia   . Pyelonephritis   . Recurrent UTI (urinary tract infection)    Past Surgical History:  Procedure Laterality Date  . CYSTOSCOPY WITH RETROGRADE PYELOGRAM, URETEROSCOPY AND STENT PLACEMENT Left 12/28/2017   Procedure: CYSTOSCOPY WITH RETROGRADE PYELOGRAM, URETEROSCOPY AND STENT PLACEMENT, STONE BASKETRY;   Surgeon: Festus Aloe, MD;  Location: [facility];  Service: Urology;  Laterality: Left;  . FINGER SURGERY     index, growth removal  . HOLMIUM LASER APPLICATION Left 05/10/349   Procedure: HOLMIUM LASER APPLICATION;  Surgeon: Festus Aloe, MD;  Location: [facility];  Service: Urology;  Laterality: Left;  . IR IMAGING GUIDED PORT INSERTION  10/31/2018  . KIDNEY STONE SURGERY  2011 or 2012  . LITHOTRIPSY  2007     A IV Location/Drains/Wounds Patient Lines/Drains/Airways Status   Active Line/Drains/Airways    Name:   Placement date:   Placement time:   Site:   Days:   Implanted Port 10/31/18 Right Chest   10/31/18    1027    Chest   186   Ureteral Drain/Stent Left ureter 6 Fr.   12/28/17    0908    Left ureter   493   External Urinary Catheter   02/23/19    1142    -   71   Incision (Closed) 12/28/17 Perineum   12/28/17    0718     493          Intake/Output Last 24 hours  Intake/Output Summary (Last 24 hours) at 05/05/2019 0148 Last data filed at 05/05/2019 0049 Gross per 24 hour  Intake 190.01 ml  Output -  Net 190.01 ml    Labs/Imaging Results for orders placed or performed during the hospital encounter of 05/04/19 (from the past 48 hour(s))  Comprehensive metabolic panel     Status: Abnormal   Collection Time: 05/04/19  8:23 PM  Result Value Ref Range   Sodium 139 135 - 145 mmol/L   Potassium 3.3 (L) 3.5 - 5.1 mmol/L   Chloride 104 98 - 111 mmol/L   CO2 23 22 - 32 mmol/L   Glucose, Bld 102 (H) 70 - 99 mg/dL   BUN 14 8 - 23 mg/dL   Creatinine, Ser 0.80 0.44 - 1.00 mg/dL   Calcium 8.7 (L) 8.9 - 10.3 mg/dL   Total Protein 7.2 6.5 - 8.1 g/dL   Albumin 3.1 (L) 3.5 - 5.0 g/dL   AST 47 (H) 15 - 41 U/L   ALT 25 0 - 44 U/L   Alkaline Phosphatase 110 38 - 126 U/L   Total Bilirubin 0.6 0.3 - 1.2 mg/dL   GFR calc non Af Amer >60 >60 mL/min   GFR calc Af Amer >60 >60 mL/min   Anion gap 12 5 - 15    Comment: Performed at [facility], [place] [address] [place], [place] [address]  CBC with Differential     Status: Abnormal   Collection Time: 05/04/19  8:23 PM  Result Value Ref Range   WBC 5.8 4.0 - 10.5 K/uL   RBC 3.24 (L) 3.87 - 5.11 MIL/uL   Hemoglobin 10.8 (L) 12.0 - 15.0 g/dL   HCT 32.9 (L) 36.0 - 46.0 %   MCV 101.5 (H) 80.0 - 100.0 fL   MCH 33.3 26.0 - 34.0 pg   MCHC 32.8 30.0 - 36.0 g/dL   RDW 20.1 (H) 11.5 - 15.5 %   Platelets 231 150 - 400 K/uL   nRBC 0.0 0.0 - 0.2 %   Neutrophils Relative % 65 %   Neutro Abs 3.8 1.7 - 7.7 K/uL   Lymphocytes Relative 17 %   Lymphs Abs 1.0 0.7 - 4.0 K/uL   Monocytes Relative 16 %   Monocytes Absolute 0.9 0.1 - 1.0 K/uL   Eosinophils Relative 0 %   Eosinophils Absolute 0.0 0.0 - 0.5 K/uL   Basophils Relative 1 %   Basophils Absolute 0.0 0.0 - 0.1 K/uL   Immature Granulocytes 1 %   Abs Immature Granulocytes 0.04 0.00 - 0.07 K/uL    Comment: Performed at [facility], [place] [address] [place], [place] [address]  Magnesium     Status: None   Collection Time: 05/04/19  8:23 PM  Result Value Ref Range   Magnesium 1.9 1.7 - 2.4 mg/dL    Comment: Performed at [facility], [place] [address], [place], [place] [address]  Phosphorus     Status: None   Collection Time: 05/04/19  8:23 PM  Result Value Ref Range   Phosphorus 2.7 2.5 - 4.6 mg/dL    Comment: Performed at [facility], [place] [address] [place], [place] [address]  Protime-INR     Status: Abnormal   Collection Time: 05/04/19  8:23 PM  Result Value Ref Range   Prothrombin Time 20.5 (H) 11.4 - 15.2 seconds   INR 1.8 (H) 0.8 - 1.2    Comment: (NOTE) INR goal varies based on device and disease states. Performed at [facility], [place] [address], [place], [place] [address]   Type and screen [place]     Status: None   Collection Time: 05/04/19  8:23 PM  Result Value Ref Range   ABO/RH(D) O POS  Antibody  Screen NEG    Sample Expiration      05/07/2019,2359 Performed at [facility], [place] [address] [place], [place] [address]   Troponin I - Once     Status: None   Collection Time: 05/04/19  8:24 PM  Result Value Ref Range   Troponin I <0.03 <0.03 ng/mL    Comment: Performed at [facility], [place] [address], [place], [place] [address]  Urinalysis, Routine w reflex microscopic     Status: Abnormal   Collection Time: 05/05/19 12:51 AM  Result Value Ref Range   Color, Urine STRAW (A) YELLOW   APPearance CLEAR CLEAR   Specific Gravity, Urine 1.006 1.005 - 1.030   pH 7.0 5.0 - 8.0   Glucose, UA NEGATIVE NEGATIVE mg/dL   Hgb urine dipstick NEGATIVE NEGATIVE   Bilirubin Urine NEGATIVE NEGATIVE   Ketones, ur 5 (A) NEGATIVE mg/dL   Protein, ur NEGATIVE NEGATIVE mg/dL   Nitrite NEGATIVE NEGATIVE   Leukocytes,Ua NEGATIVE NEGATIVE    Comment: Performed at [place] [address], [place], [place] [address]   No results found.  Pending Labs Unresulted Labs (From admission, onward)    Start     Ordered   05/05/19 0800  Heparin level (unfractionated)  Once-Timed,   STAT     05/05/19 0114   05/05/19 0800  CBC  Daily,   R     05/05/19 0114   05/05/19 0800  Protime-INR  Daily,   R     05/05/19 0114   05/04/19 1911  Novel Coronavirus,NAA,(SEND-OUT TO REF LAB - TAT 24-48 hrs); Hosp Order  (Asymptomatic Patients Labs)  Once,   STAT    Question:  Rule Out  Answer:  Yes   05/04/19 1911   Signed and Held  Basic metabolic panel  Tomorrow morning,   R     Signed and Held   Signed and Held  CBC  Tomorrow morning,   R     Signed and Held   Signed and Held  Protime-INR  Daily,   R     Signed and Held          Vitals/Pain Today's Vitals   05/04/19 1849 05/05/19 0000 05/05/19 0030 05/05/19 0130  BP: 131/77  125/76 137/77  Pulse: (!) 101 95 92 86  Resp: 18 19 14 18   Temp: 97.7 F (36.5 C)     TempSrc: Oral     SpO2: 94% 95% 95%  95%  PainSc:        Isolation Precautions No active isolations  Medications Medications  potassium chloride 10 mEq in 100 mL IVPB ( Intravenous Stopped 05/04/19 2253)  potassium chloride 10 mEq in 100 mL IVPB (10 mEq Intravenous New Bag/Given 05/05/19 0051)  heparin ADULT infusion 100 units/mL (25000 units/227mL sodium chloride 0.45%) (1,000 Units/hr Intravenous New Bag/Given 05/05/19 0004)  Warfarin - Pharmacist Dosing Inpatient (has no administration in time range)  morphine 4 MG/ML injection 4 mg (4 mg Intravenous Given 05/04/19 2028)  sodium chloride 0.9 % bolus 1,000 mL (0 mLs Intravenous Stopped 05/04/19 2145)  ondansetron (ZOFRAN) injection 4 mg (4 mg Intravenous Given 05/04/19 2028)  0.9 %  sodium chloride infusion ( Intravenous New Bag/Given 05/04/19 2145)  heparin bolus via infusion 2,000 Units (2,000 Units Intravenous Bolus from Bag 05/05/19 0005)  warfarin (COUMADIN) tablet 7.5 mg (7.5 mg Oral Given 05/05/19 0007)    Mobility walks with person assist Low fall risk  R Recommendations: See Admitting Provider Note  Report given to: Alver Fisher, RN

## 2019-05-05 NOTE — Progress Notes (Signed)
Pt got very upset today that she had to call to get permission every time she had to go to the bathroom and that the alarms went off every time she got up. Pt. Stated that  "she felt like a child to get permission to use the [facility] that was only 2 ft away".  Pt. Also got upset that she had a female Chartered certified accountant. Pt. Was educated about the reasons for the alarms and that we wanted to keep her safe however it was her right as a patient to refuse the bed alarms. This info was reported in bedside report.

## 2019-05-05 NOTE — Progress Notes (Signed)
[place] for heparin and warfarin Indication: Recent DVT; INR subtherapeutic on admission  Allergies  Allergen Reactions  . Barium Sulfate Diarrhea    Hypersensitivity intolerance reaction of stomach cramps and diarrhea.    Patient Measurements: Height: 5\' 6"  (167.6 cm) Weight: 134 lb 1.6 oz (60.8 kg) IBW/kg (Calculated) : 59.3 Heparin Dosing Weight: 61 kg  Vital Signs: Temp: 99 F (37.2 C) (06/21 0357) Temp Source: Oral (06/21 0357) BP: 137/75 (06/21 0357) Pulse Rate: 88 (06/21 0330)  Labs: Recent Labs    05/04/19 2023 05/04/19 2024 05/05/19 0800  HGB 10.8*  --  9.3*  HCT 32.9*  --  30.2*  PLT 231  --  206  LABPROT 20.5*  --  22.7*  INR 1.8*  --  2.0*  HEPARINUNFRC  --   --  0.58  CREATININE 0.80  --  0.70  TROPONINI  --  <0.03  --     Estimated Creatinine Clearance: 63 mL/min (by C-G formula based on SCr of 0.7 mg/dL).   Medical History: Past Medical History:  Diagnosis Date  . Arthritis   . Chronic kidney disease   . Dog bite of wrist   . History of kidney stones   . History of radiation therapy 10/23/18- 11/06/18   Left lower rib mass/ 30 Gy delivered in 10 fractions 3 Gy.   . Kidney infection   . Left renal mass   . Lung cancer ([place])   . Metastatic bone cancer ([place])   . Osteopenia   . Pyelonephritis   . Recurrent UTI (urinary tract infection)      Assessment: 68 yo F on warfarin for recent Port-A-Cath related VTE, has been unable to keep down her meds, including warfarin, for past 3-4 days due to N/V.   Home warfarin dose: 5 mg qday. Last dose 6/16 or 6/17 - pt unsure.  Admission INR 1.8.  Hg 10.8, PLTC 231.  No bleeding reported.    Today, 05/05/2019 Heparin level therapeutic (0.58) on 1000 units/hr INR at lower end of therapeutic range (2.0) after resuming warfarin last night with 7.5 mg x 1 Hgb low, down slightly with rehydration. Pltc WNL. No bleeding or infusion-related problems per RN report No  major drug interactions with warfarin noted Has order to advance diet as tolerated   Goal of Therapy:  INR 2-3 Heparin level 0.3-0.7 units/ml Monitor platelets by anticoagulation protocol: Yes   Plan:  Warfarin 5 mg PO x 1 at 1800 as per patient's reported usual dosage. INR daily while inpatient. Note that warfarin requirements may be reduced until patient can tolerate her usual diet. Continue heparin infusion at 1000 units/hr pending MD decision on whether to stop today or continue until tomorrow. If heparin continues today, check confirmatory heparin level at 4 pm  Clayburn Pert, PharmD, BCPS [phone] 05/05/2019  9:40 AM

## 2019-05-05 NOTE — Progress Notes (Signed)
[place] for heparin and warfarin Indication: Recent DVT; INR subtherapeutic on admission  Allergies  Allergen Reactions  . Barium Sulfate Diarrhea    Hypersensitivity intolerance reaction of stomach cramps and diarrhea.    Patient Measurements: Height: 5\' 6"  (167.6 cm) Weight: 134 lb 1.6 oz (60.8 kg) IBW/kg (Calculated) : 59.3 Heparin Dosing Weight: 61 kg  Vital Signs: Temp: 98.5 F (36.9 C) (06/21 1336) Temp Source: Oral (06/21 1336) BP: 126/77 (06/21 1336) Pulse Rate: 79 (06/21 1336)  Labs: Recent Labs    05/04/19 2023 05/04/19 2024 05/05/19 0800 05/05/19 1652  HGB 10.8*  --  9.3*  --   HCT 32.9*  --  30.2*  --   PLT 231  --  206  --   LABPROT 20.5*  --  22.7*  --   INR 1.8*  --  2.0*  --   HEPARINUNFRC  --   --  0.58 0.78*  CREATININE 0.80  --  0.70  --   TROPONINI  --  <0.03  --   --     Estimated Creatinine Clearance: 63 mL/min (by C-G formula based on SCr of 0.7 mg/dL).   Medical History: Past Medical History:  Diagnosis Date  . Arthritis   . Chronic kidney disease   . Dog bite of wrist   . History of kidney stones   . History of radiation therapy 10/23/18- 11/06/18   Left lower rib mass/ 30 Gy delivered in 10 fractions 3 Gy.   . Kidney infection   . Left renal mass   . Lung cancer ([place])   . Metastatic bone cancer ([place])   . Osteopenia   . Pyelonephritis   . Recurrent UTI (urinary tract infection)      Assessment: 68 yo F on warfarin for recent Port-A-Cath related VTE, has been unable to keep down her meds, including warfarin, for past 3-4 days due to N/V.   Home warfarin dose: 5 mg qday. Last dose 6/16 or 6/17 - pt unsure.  Admission INR 1.8.  Hg 10.8, PLTC 231.  No bleeding reported.    Today, 05/05/2019 Heparin level therapeutic (0.58) on 1000 units/hr INR at lower end of therapeutic range (2.0) after resuming warfarin last night with 7.5 mg x 1 Hgb low, down slightly with rehydration. Pltc WNL. No  bleeding or infusion-related problems per RN report No major drug interactions with warfarin noted Has order to advance diet as tolerated  2nd shift update: 16:52 HL = 0.78 (supratherapeutic) with IV heparin infusing at 1000 units/hr.  No complications of therapy noted  Goal of Therapy:  INR 2-3 Heparin level 0.3-0.7 units/ml Monitor platelets by anticoagulation protocol: Yes   Plan:  Decrease IV heparin to 900 units/hr Repeat HL 6 hr after rate decrease  Leone Haven, PharmD 05/05/2019  5:51 PM

## 2019-05-06 ENCOUNTER — Ambulatory Visit: Payer: Medicare Other | Admitting: Radiation Oncology

## 2019-05-06 ENCOUNTER — Telehealth: Payer: Self-pay | Admitting: *Deleted

## 2019-05-06 DIAGNOSIS — E876 Hypokalemia: Secondary | ICD-10-CM

## 2019-05-06 LAB — CBC
HCT: 28.7 % — ABNORMAL LOW (ref 36.0–46.0)
Hemoglobin: 9.1 g/dL — ABNORMAL LOW (ref 12.0–15.0)
MCH: 33.1 pg (ref 26.0–34.0)
MCHC: 31.7 g/dL (ref 30.0–36.0)
MCV: 104.4 fL — ABNORMAL HIGH (ref 80.0–100.0)
Platelets: 200 10*3/uL (ref 150–400)
RBC: 2.75 MIL/uL — ABNORMAL LOW (ref 3.87–5.11)
RDW: 20.4 % — ABNORMAL HIGH (ref 11.5–15.5)
WBC: 4.2 10*3/uL (ref 4.0–10.5)
nRBC: 0 % (ref 0.0–0.2)

## 2019-05-06 LAB — COMPREHENSIVE METABOLIC PANEL WITH GFR
ALT: 20 U/L (ref 0–44)
AST: 39 U/L (ref 15–41)
Albumin: 2.8 g/dL — ABNORMAL LOW (ref 3.5–5.0)
Alkaline Phosphatase: 89 U/L (ref 38–126)
Anion gap: 9 (ref 5–15)
BUN: 5 mg/dL — ABNORMAL LOW (ref 8–23)
CO2: 21 mmol/L — ABNORMAL LOW (ref 22–32)
Calcium: 8 mg/dL — ABNORMAL LOW (ref 8.9–10.3)
Chloride: 108 mmol/L (ref 98–111)
Creatinine, Ser: 0.61 mg/dL (ref 0.44–1.00)
GFR calc Af Amer: >60 mL/min
GFR calc non Af Amer: >60 mL/min
Glucose, Bld: 90 mg/dL (ref 70–99)
Potassium: 2.8 mmol/L — ABNORMAL LOW (ref 3.5–5.1)
Sodium: 138 mmol/L (ref 135–145)
Total Bilirubin: 0.6 mg/dL (ref 0.3–1.2)
Total Protein: 6.2 g/dL — ABNORMAL LOW (ref 6.5–8.1)

## 2019-05-06 LAB — PROTIME-INR
INR: 3.2 — ABNORMAL HIGH (ref 0.8–1.2)
Prothrombin Time: 32.6 seconds — ABNORMAL HIGH (ref 11.4–15.2)

## 2019-05-06 LAB — MAGNESIUM: Magnesium: 1.9 mg/dL (ref 1.7–2.4)

## 2019-05-06 LAB — HEPARIN LEVEL (UNFRACTIONATED): Heparin Unfractionated: 0.61 IU/mL (ref 0.30–0.70)

## 2019-05-06 LAB — PHOSPHORUS: Phosphorus: 2.8 mg/dL (ref 2.5–4.6)

## 2019-05-06 MED ORDER — ADULT MULTIVITAMIN W/MINERALS CH
1.0000 | ORAL_TABLET | Freq: Every day | ORAL | Status: DC
  Filled 2019-05-06: qty 1

## 2019-05-06 MED ORDER — MAGNESIUM SULFATE 50 % IJ SOLN: 3.0000 g | Freq: Once | INTRAVENOUS | Status: DC

## 2019-05-06 MED ORDER — POTASSIUM CHLORIDE 10 MEQ/100ML IV SOLN
10.0000 meq | INTRAVENOUS | Status: AC
  Administered 2019-05-06 (×4): 10 meq via INTRAVENOUS
  Filled 2019-05-06 (×4): qty 100

## 2019-05-06 MED ORDER — BOOST / RESOURCE BREEZE PO LIQD CUSTOM
1.0000 | Freq: Three times a day (TID) | ORAL | Status: DC
  Administered 2019-05-06 – 2019-05-07 (×3): 1 via ORAL

## 2019-05-06 MED ORDER — PROCHLORPERAZINE MALEATE 10 MG PO TABS: 10.0000 mg | ORAL_TABLET | Freq: Four times a day (QID) | ORAL | 0 refills | Status: AC | PRN

## 2019-05-06 MED ORDER — ADULT MULTIVITAMIN W/MINERALS CH: 1.0000 | ORAL_TABLET | Freq: Every day | ORAL | 0 refills | Status: AC

## 2019-05-06 MED ORDER — POTASSIUM CHLORIDE 10 MEQ/100ML IV SOLN
10.0000 meq | INTRAVENOUS | Status: AC
  Administered 2019-05-06 (×3): 10 meq via INTRAVENOUS
  Filled 2019-05-06 (×2): qty 100

## 2019-05-06 MED ORDER — POTASSIUM CHLORIDE CRYS ER 20 MEQ PO TBCR
40.0000 meq | EXTENDED_RELEASE_TABLET | Freq: Two times a day (BID) | ORAL | Status: DC
  Administered 2019-05-06: 40 meq via ORAL
  Filled 2019-05-06 (×2): qty 2

## 2019-05-06 NOTE — Progress Notes (Signed)
PROGRESS NOTE    [name]  OZH:[id number] DOB: 10/16/1951 DOA: 05/04/2019 PCP: [name], MD   Brief Narrative:  HPI per Dr. Roney Jaffe on 05/04/2019 Alison Sullivan is a 68 y.o. 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  She progressed on 1st round of chemoRx , second line Rx took 6 cycles and is on maint Rx w/ alimta and Keytruda.  She presents with nausea/ vomiting for 3-4 days, gen'd weakness.  Can't keep her meds down including her coumadin (for DVT related to her port). Asked to see for admission.   Patient has been having N/V for last 3-4 days, can't keep her medications down, to weak to get and having troubles taking care of herself.  She lives alone, her only child is her son who 1 week ago graduated from [employer] and got a job in [place] and just moved up there this week.  She has an aide who comes in weekly on Wednesday's to help w/ chores.  She states "after this I think I am going to need her to come over more often".    No abd pain, no fevers, chills, SOB. Having some skin irritation and mouth irritation shes states they are attributing to the oral chemo/ immunoRx.  Takes atarax for the itching.  She has pain issues w/ spine and ribs and takes oxycodone and neurontin for this.  She hasn't been able to keep any of her chemo meds / pain meds/ nausea meds or coumadin due to the N/V.  She is very weak and having troubles taking her of herself at home.   Stage IV (T1b,N2, 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.. presented with left upper lobe lung nodule + mediastinal lymphadenopathy as well as right upper lobe as well as metastatic bone disease to the 6th and 10th rib.  She took systemic chemoRx carbo/ etoposide/ Tecentriq every 3 wks, had 2 cycles then dc'd due to disease progression.  Palliative XRT to T120 vertebral body bone lesions f/b Dr Isidore Moos. Current Rx is systemic chemoRX w/ carbo + alimta and Keytruda q 3wks started early Feb 2020, sp 6 cycles, now on maintenance Alimta and Keytruda.   Recent admits:  April 2020 - admit for abd pain , found to have acute diverticulitis in prox sigmoid colon w/ intramural abscess in wall of sigmoid.  Rx'd IV zosyn , seen by Gen surg no surg needed.  dc'd on po avelox for 10d.  Low K and Low Na, corrected. dc'd on 40 meq per day x 4d of KCl. Anemia Hb 8.8.  Suspicious nonocclusive thrombus at the tip of the port-a-cath, started on coumadin.   June 1- 5, 2020 - admitted for cachexia, anemia, pancytopenia, rib pain, hypoMg and hypokalemia and diarrhea. Improved. dc'd home.   **Interim History Patient's nausea and vomiting is improved and she is very hungry so diet was advanced to clear liquid diet.  Diet was further advanced to soft diet and patient tolerated diet this morning.  She is post go for radiation therapy prior to discharge however this was canceled due to her cover testing not being back.  Right before patient was discharged she vomited and discharge will be canceled and she will be observed another night.  Hospitalization has been complicated by hypokalemia and vomiting.  Assessment & Plan:   Principal Problem:  Nausea & vomiting Active Problems:   Rib pain on left side   Bone metastases (HCC)   Port-A-Cath in place   Long term current use of anticoagulant therapy   Neuroendocrine carcinoma of the left lung, stage IV w/ lung nodules / mediastinal LAN and bony mets   Dehydration   Intractable nausea and vomiting  Nausea and Vomiting -Had improved and patient was set to be discharged but prior to discharge she had her potassium pill and vomited and had to be given Compazine -Due to immunotherapy vs other etiology -C/w Symptomatic Treatment and Supportive -C/w Prochlorperazine 10 mg q4hprn N/V all patient is to  hospitalized -Clear liquid diet advance to soft diet and patient tolerated soft diet this afternoon but vomited one time right before discharge so we will hold her discharge and watch her another night  Dehydration -Improving. -IVF Rate decreased to 50 mL/hr now we will continue while patient is still hospitalized  Stage IV Neuroendocrine Cancer appearing carcinoma with a small amount of large cell features -Aggressive features, has had 2nd opinion at [facility].  -Has lung nodules with mediastinal lymphadenopathy and T10 and rib involvement diagnosed in 2010 -Progressing first round of chemotherapy and secondary chemotherapy 6 cycles and is now on maintenance therapy with Alimta and Keytruda -F/b Dr Julien Nordmann here.  -C/w IV pain meds prn for bony mets mostly; C/w 2-4 mg IV q3hprn Moderate Pain and -C/w Lidocaine-Prilocaine 1 app Topically  -outpatient palliative care follow-up with [facility] [place]  FTT -Nutrition Counseling and dietitian has placed in boost breeze 3 times daily along with a daily multivitamin with minerals -Will obtain PT/OT and they recommended PT follow-up and -C/w IVF but reduce rate to 50 mL/hr and advance Diet as Tolerated   H/o possible DVT of port-a-cath  -Heparin drip now stopped given the patient is INR was 3.2 -Cont po warfarin and try to get INR up if pt can keep pills down  -Last INR was 3.2  Macrocytic Anemia -Patient's Hb/Hct went from 10.8/32.9 -> 9.3/30.2 -> 9.1/28.7 -Check Anemia Panel in the AM -Continue to Monitor for S/Sx of Bleeding; Currently no Overt Bleeding noted -Repeat CBC in AM  Hypomagnesemia -Patient's Mag Level this AM was 1.9 -Continue to Monitor and Replete as Necessary -Repeat Mag Level in AM   Hypo-kalemia Patient potassium level was 2.8 -Replete with IV KCl 40 mEQ and po KCl 40 mEQ BID x2 doses but patient unable to tolerate po and vomited right after taking the pills so will replete via IV -Will watch  patient again over night  Continue monitor and replete as necessary -Magnesium levels 1.9 -Repeat CMP in a.m.  Abnormal and elevated AST -Improved and now AST is now 39 Continue monitor and trend CMP in a.m.  DVT prophylaxis: Anticoagulated with Heparin gtt Code Status: FULL CODE  Family Communication: No family present at bedside  Disposition Plan: Anticipate D/C Home in the Next 24-48 hours since patient vomited today.  Patient was possibly discharged today but prior to discharge she vomited her potassium and will be observed another night  Consultants:   None   Procedures:  None  Antimicrobials:  Anti-infectives (From admission, onward)   None     Subjective: Seen and examined at bedside he was feeling better.  Denies chest pain, headaches or dizziness.  Still has some abdominal soreness from retching but states that it was not as bad as it or if she came in.  She was supposed to  go for radiation but this was canceled due to clinical but has not been back.  Hospitalization was complicated by hypokalemia and she is having a potassium repletion right prior to discharge she is given p.o. potassium chloride pills however she vomited them up within minutes of taking them so discharge was canceled and will observe another night and replete potassium IV again.  Objective: Vitals:   05/05/19 1336 05/05/19 2043 05/06/19 0629 05/06/19 1555  BP: 126/77 140/85 127/69 (!) 150/98  Pulse: 79 71 72 86  Resp: 16 18 20 18   Temp: 98.5 F (36.9 C) 98 F (36.7 C) 98.4 F (36.9 C) 98.4 F (36.9 C)  TempSrc: Oral Oral Oral Oral  SpO2: 96% 94% 92% 92%  Weight:   61.5 kg   Height:        Intake/Output Summary (Last 24 hours) at 05/06/2019 1913 Last data filed at 05/06/2019 2778 Gross per 24 hour  Intake --  Output 700 ml  Net -700 ml   Filed Weights   05/05/19 0357 05/06/19 0629  Weight: 60.8 kg 61.5 kg   Examination: Physical Exam:  Constitutional: Likely ill-appearing Caucasian  female currently in no acute distress appears calm and comfortable and sitting in bed  Eyes: Lids and conjunctive are normal.  Sclera anicteric ENMT: External ears nose appear normal.  Grossly normal hearing Neck: Appears supple no JVD Respiratory: Mildly diminished auscultation bilaterally no appreciable wheezing, rales or rhonchi.  Patient was not tachypneic or using any accessory muscles to breathe Cardiovascular: Regular rate and rhythm.  No appreciable murmurs, rubs, gallops Abdomen: Soft, slightly tender to palpate.  Nondistended.  Bowel sounds present GU: Deferred Musculoskeletal: No contractures or cyanosis.  No joint deformities in the upper lower extremities Skin: Appears warm and dry with no appreciable rashes or lesions on limited skin evaluation Neurologic: Cranial nerves II through XII grossly intact no appreciable focal deficits.  Romberg sign cerebellar reflexes were not assessed Psychiatric: Normal judgment and insight.  Patient is awake, alert, oriented x3.  Has a pleasant mood and affect  Data Reviewed: I have personally reviewed following labs and imaging studies  CBC: Recent Labs  Lab 05/04/19 2023 05/05/19 0800 05/06/19 0414  WBC 5.8 4.6 4.2  NEUTROABS 3.8 2.8  --   HGB 10.8* 9.3* 9.1*  HCT 32.9* 30.2* 28.7*  MCV 101.5* 104.9* 104.4*  PLT 231 206 242   Basic Metabolic Panel: Recent Labs  Lab 05/04/19 2023 05/05/19 0800 05/06/19 0823  NA 139 141 138  K 3.3* 3.8 2.8*  CL 104 112* 108  CO2 23 22 21*  GLUCOSE 102* 89 90  BUN 14 10 5*  CREATININE 0.80 0.70 0.61  CALCIUM 8.7* 7.5* 8.0*  MG 1.9 1.5* 1.9  PHOS 2.7 2.5 2.8   GFR: Estimated Creatinine Clearance: 63 mL/min (by C-G formula based on SCr of 0.61 mg/dL). Liver Function Tests: Recent Labs  Lab 05/04/19 2023 05/05/19 0800 05/06/19 0823  AST 47* 39 39  ALT 25 20 20   ALKPHOS 110 91 89  BILITOT 0.6 0.7 0.6  PROT 7.2 5.9* 6.2*  ALBUMIN 3.1* 2.5* 2.8*   No results for input(s): LIPASE,  AMYLASE in the last 168 hours. No results for input(s): AMMONIA in the last 168 hours. Coagulation Profile: Recent Labs  Lab 05/04/19 2023 05/05/19 0800 05/06/19 0414  INR 1.8* 2.0* 3.2*   Cardiac Enzymes: Recent Labs  Lab 05/04/19 2024  TROPONINI <0.03   BNP (last 3 results) No results for input(s): PROBNP in the  last 8760 hours. HbA1C: No results for input(s): HGBA1C in the last 72 hours. CBG: No results for input(s): GLUCAP in the last 168 hours. Lipid Profile: No results for input(s): CHOL, HDL, LDLCALC, TRIG, CHOLHDL, LDLDIRECT in the last 72 hours. Thyroid Function Tests: No results for input(s): TSH, T4TOTAL, FREET4, T3FREE, THYROIDAB in the last 72 hours. Anemia Panel: No results for input(s): VITAMINB12, FOLATE, FERRITIN, TIBC, IRON, RETICCTPCT in the last 72 hours. Sepsis Labs: No results for input(s): PROCALCITON, LATICACIDVEN in the last 168 hours.  No results found for this or any previous visit (from the past 240 hour(s)).   Radiology Studies: No results found.  Scheduled Meds:  feeding supplement  1 Container Oral TID BM   folic acid  1 mg Oral Daily   mouth rinse  15 mL Mouth Rinse BID   multivitamin with minerals  1 tablet Oral Daily   pantoprazole  40 mg Oral Daily   potassium chloride  40 mEq Oral BID   Warfarin - Pharmacist Dosing Inpatient   Does not apply q1800   Continuous Infusions:  sodium chloride 50 mL/hr at 05/06/19 1559   potassium chloride      LOS: 1 day   Kerney Elbe, DO [facility] PAGER is on [place]  If 7PM-7AM, please contact night-coverage [email or web address] Password [facility] 05/06/2019, 7:13 PM

## 2019-05-06 NOTE — Evaluation (Signed)
Physical Therapy One Time Evaluation Patient Details Name: Alison Sullivan MRN: [id number] DOB: June 07, 1951 Today's Date: 05/06/2019   History of Present Illness  68 y.o. 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 All education has been completed and the patient has no further questions.  Pt ambulated around room (declined hallway) and mobilizing at her baseline.  Pt agreeable to no further needs and reports d/c home after radiation treatment this afternoon. See below for any follow-up Physical Therapy or equipment needs. PT is signing off. Thank you for this referral.     Follow Up Recommendations No PT follow up    Equipment Recommendations  None recommended by PT    Recommendations for Other Services       Precautions / Restrictions Precautions Precautions: Fall      Mobility  Bed Mobility Overal bed mobility: Modified Independent                Transfers Overall transfer level: Modified independent                  Ambulation/Gait Ambulation/Gait assistance: Supervision;Modified independent (Device/Increase time) Gait Distance (Feet): 70 Feet Assistive device: None Gait Pattern/deviations: WFL(Within Functional Limits)     General Gait Details: pt pushed IV pole however no needed for support, pt only preferred to ambulate within room, no unsteadiness observed  Stairs            Wheelchair Mobility    Modified Rankin (Stroke Patients Only)       Balance Overall balance assessment: No apparent balance deficits (not formally assessed)                                           Pertinent Vitals/Pain Pain Assessment: No/denies pain    Home Living Family/patient expects to be discharged to::  Private residence Living Arrangements: Alone Available Help at Discharge: Family Type of Home: House Home Access: Level entry     Home Layout: One level Home Equipment: None      Prior Function Level of Independence: Independent               Hand Dominance        Extremity/Trunk Assessment        Lower Extremity Assessment Lower Extremity Assessment: Overall WFL for tasks assessed    Cervical / Trunk Assessment Cervical / Trunk Assessment: Normal  Communication   Communication: No difficulties  Cognition Arousal/Alertness: Awake/alert Behavior During Therapy: WFL for tasks assessed/performed Overall Cognitive Status: Within Functional Limits for tasks assessed                                        General Comments      Exercises     Assessment/Plan    PT Assessment Patent does not need any further PT services  PT Problem List         PT Treatment Interventions      PT Goals (Current goals can be found in the Care Plan section)  Acute Rehab PT Goals PT Goal Formulation: All assessment and education complete,  DC therapy    Frequency     Barriers to discharge        Co-evaluation               AM-PAC PT "6 Clicks" Mobility  Outcome Measure Help needed turning from your back to your side while in a flat bed without using bedrails?: None Help needed moving from lying on your back to sitting on the side of a flat bed without using bedrails?: None Help needed moving to and from a bed to a chair (including a wheelchair)?: None Help needed standing up from a chair using your arms (e.g., wheelchair or bedside chair)?: A Little Help needed to walk in hospital room?: A Little Help needed climbing 3-5 steps with a railing? : A Little 6 Click Score: 21    End of Session   Activity Tolerance: Patient tolerated treatment well Patient left: in chair;with call bell/phone within reach(declines chair alarm) Nurse Communication:  Mobility status PT Visit Diagnosis: Difficulty in walking, not elsewhere classified (R26.2)    Time: 8295-6213 PT Time Calculation (min) (ACUTE ONLY): 14 min   Charges:   PT Evaluation $PT Eval Low Complexity: [place], PT, DPT Acute Rehabilitation Services Office: [phone] Pager: [phone]  Trena Platt 05/06/2019, 12:52 PM

## 2019-05-06 NOTE — Telephone Encounter (Signed)
Received call from pt's son.  Pt is currently admitted but apparently set for discharge this afternoon after her radiation treatment. Alison Sullivan, her son, is asking for a palliative care referral to help patient at home, manage her symptoms and frequent trips to the ED. He now lives out of town and not always as available and feels the need to do what he can to help her manage at home and decrease ED visits.  Please advise

## 2019-05-06 NOTE — Progress Notes (Signed)
Initial Nutrition Assessment  RD working remotely.   DOCUMENTATION CODES:   (unable to assess for malnutrition at this time)  INTERVENTION:  - will trial Boost Breeze TID, each supplement provides 250 kcal and 9 grams of protein. - will order daily multivitamin with minerals. - continue to encourage PO intakes as tolerated.   NUTRITION DIAGNOSIS:   Inadequate oral intake related to acute illness, nausea, vomiting as evidenced by per patient/family report.  GOAL:   Patient will meet greater than or equal to 90% of their needs  MONITOR:   PO intake, Supplement acceptance, Labs, Weight trends, I & O's  REASON FOR ASSESSMENT:   Malnutrition Screening Tool  ASSESSMENT:   68 y.o. female with hx of stage 4 neuroendocrine carcinoma with lung nodules/mediastinal LAN and T10 and rib involvement diagnosed in 09/2018. She is s/p first round of chemo and is on Keytruda. She presented to the ED with N/V x3-4 days and generalized weakness. She has been unable to keep her medications down, including coumadin which she takes for DVR related to her port. She has had difficulty taking care of herself d/t weakness. Patient lives alone and has an aide that helps in the home on Wednesdays.  Diet was advanced from CLD to Soft today at 8:25 AM with no intakes documented since that time. Unable to reach patient via phone. [place] RD spoke with patient and her son via phone on 6/17. At that time patient had reported ongoing very poor appetite and lack of desire to eat. She reported that unless someone reminded her to eat or set something down in front of her, she would go without eating. Her son had reported that they were planning to hire additional in-home help as aide was only hired for one day/week. As of that time, patient had not tried any oral nutrition supplements. She had reported N/V/D were intermittently an issue but nothing consistent (this was 1-2 days prior to when patient reported persistent N/V  began).   Per chart review, current weight is 136 lb and weight on 6/1 was 138 lb. Weight on 5/18 was 140 lb which indicates 4 lb weight loss (2.8% body weight) in the past 1 month; not significant for time frame.  Per notes: N/V--improving, dehydration on admission--improving, stage 4 neuroendocrine cancer and has had a second opinion at [place], FTT, macrocytic anemia, hypomagnesemia with repletion ordered, hypokalemia--resolved s/p repletion. MD note yesterday afternoon stated "anticipate d/c home in the next 24-48 hours."   Medications reviewed; 1 mg folvite/day, 3 g IV Mg sulfate x1 run 6/21, 10 mEq IV KCl x3 runs 6/22, 40 mEq K-Dur x2 doses 6/22. Labs reviewed; Cl: 112 mmol/l, Ca: 7.5 mg/dl, Mg: 1.5 mg/dl.  IVF; NS @ 50 ml/hr.     NUTRITION - FOCUSED PHYSICAL EXAM:  unable to complete at this time.   Diet Order:   Diet Order            DIET SOFT Room service appropriate? Yes; Fluid consistency: Thin  Diet effective now              EDUCATION NEEDS:   Not appropriate for education at this time  Skin:  Skin Assessment: Reviewed RN Assessment  Last BM:  6/20  Height:   Ht Readings from Last 1 Encounters:  05/05/19 5\' 6"  (1.676 m)    Weight:   Wt Readings from Last 1 Encounters:  05/06/19 61.5 kg    Ideal Body Weight:     BMI:  Body mass  index is 21.89 kg/m.  Estimated Nutritional Needs:   Kcal:  2030-2215 kcal  Protein:  92-105 grams  Fluid:  >/= 2.2 L/day      Jarome Matin, MS, RD, LDN, [facility] Inpatient Clinical Dietitian Pager # [phone] After hours/weekend pager # [phone]

## 2019-05-06 NOTE — Progress Notes (Signed)
Patient to be discharge but felt she was ready to take her oral potassium dose.  Patient gagged immediately and stated, "I am going to throw up.  I feel just like I did when I came in."  Compazine given.  Patient received IV doses of Potassium today.  Patient had tolerated advance diet and lunch and evening meal and tolerated oral medications today.  Dr. Alfredia Ferguson notified.  Discharge cancelled.  See new orders to follow and will monitor overnight.

## 2019-05-06 NOTE — Telephone Encounter (Signed)
The son has been told today to ask for this this while his mother in an inpatient.

## 2019-05-06 NOTE — Telephone Encounter (Signed)
[place]man with me but this needs to be arranged by the hospitalist before discharge.

## 2019-05-06 NOTE — Progress Notes (Signed)
OT Cancellation Note  Patient Details Name: Alison Sullivan MRN: [id number] DOB: [birth year]   Cancelled Treatment:    Reason Eval/Treat Not Completed: Pain limiting ability to participate  RN just gave pt pain meds- will check on pt next day  Kari Baars, [place] [phone] Office- [phone], Edwena Felty D 05/06/2019, 4:30 PM

## 2019-05-06 NOTE — Discharge Summary (Signed)
Physician Discharge Summary  Alison Sullivan VWU:[id number] DOB: 03/10/1951 DOA: 05/04/2019  PCP: Alison Koch, MD  Admit date: 05/04/2019 Discharge date: 05/07/2019  Admitted From: Home Disposition: Home  Recommendations for Outpatient Follow-up:  1. Follow up with PCP in 1-2 weeks 2. Follow up with Dr. Inda Merlin of Oncology within 1-2 weeks  3. Follow up with Radiation Oncology Dr. Isidore Moos for Radiotherapy 4. Follow up with [facility] [place] at [phone] for outpatient Palliative Care Referral 5. Please obtain CMP/CBC, Mag, Phos in one week; Repeat CMP in AM along with PT and INR 6. Please follow up on the following pending results: [place]: No Equipment/Devices: None recommended by PT    Discharge Condition: Stable CODE STATUS: FULL CODE Diet recommendation: Soft Heart Healthy Diet   Brief/Interim Summary: HPI per Dr. 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 She progressed on 1st round of chemoRx , second line Rx took 6 cycles and is on maint Rx w/ alimta and Keytruda. She presents with nausea/ vomiting for 3-4 days, gen'd weakness. Can't keep her meds down including her coumadin (for DVT related to her port). Asked to see for admission.   Patient has been having N/V for last 3-4 days, can't keep her medications down, to weak to get and having troubles taking care of herself. She lives alone, her only child is her son who 1 week ago graduated from [employer] and got a job in [place] and just moved up there this week. She has an aide who comes in weekly on Wednesday's to help w/ chores. She states "after this I think I am going to need her to come over more often".   No abd pain, no fevers, chills, SOB. Having some skin irritation and mouth irritation  shes states they are attributing to the oral chemo/ immunoRx. Takes atarax for the itching. She has pain issues w/ spine and ribs and takes oxycodone and neurontin for this. She hasn't been able to keep any of her chemo meds / pain meds/ nausea meds or coumadin due to the N/V. She is very weak and having troubles taking her of herself at home.   Stage IV (T1b,N2, 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Marland KitchenMarland Kitchenpresented with left upper lobe lung nodule+mediastinal lymphadenopathy as well as right upper lobe as well as metastatic bone disease to the 6thand 10th rib. She took systemic chemoRx carbo/ etoposide/ Tecentriq every 3 wks, had 2 cycles then dc'd due to disease progression. Palliative XRT to T120 vertebral body bone lesions f/b Dr Isidore Moos. Current Rx is systemic chemoRX w/ carbo + alimta and Keytruda q 3wks started early Feb 2020, sp 6 cycles, now on maintenance Alimta and Keytruda.   Recent admits: April 2020 - admit for abd pain , found to have acute diverticulitis in prox sigmoid colon w/ intramural abscess in wall of sigmoid. Rx'd IV zosyn , seen by Gen surg no surg needed. dc'd on po avelox for 10d. Low K and Low Na, corrected. dc'd on 40 meq per day x 4d of KCl. Anemia Hb 8.8. Suspicious nonocclusive thrombusat the tip of theport-a-cath, started oncoumadin.  June 1- 5, 2020 - admitted for cachexia, anemia, pancytopenia, rib pain, hypoMg and hypokalemia and diarrhea. Improved. dc'd home.   **Interim History Patient's  nausea and vomiting is improved and she is very hungry so diet was advanced to clear liquid diet.  Diet was further advanced to soft diet and patient tolerated diet this morning.  She is post go for radiation therapy prior to discharge however this was canceled due to her cover testing not being back.  Right before patient was discharged she vomited and discharge will be canceled and she will be observed  another night.  Hospitalization has been complicated by hypokalemia and vomiting but this is been improved and supplemented.  Patient has no longer vomiting and she has been stable for discharge and will need to follow-up with PCP, radiation oncology, as well as medical oncology and have repeat blood work done in a.m.  Discharge Diagnoses:  Principal Problem:   Nausea & vomiting Active Problems:   Rib pain on left side   Bone metastases (HCC)   Port-A-Cath in place   Long term current use of anticoagulant therapy   Neuroendocrine carcinoma of the left lung, stage IV w/ lung nodules / mediastinal LAN and bony mets   Dehydration   Intractable nausea and vomiting  Nausea and Vomiting -Had improved and patient was set to be discharged but prior to discharge she had her potassium pill and vomited and had to be given Compazine -Due to immunotherapy vs other etiology -C/w Symptomatic Treatment and Supportive -C/w Prochlorperazine 10 mg q4hprn N/V all patient is to hospitalized -Clear liquid diet advance to soft diet and patient tolerated soft diet this afternoon but vomited one time right before discharge yesterday so discharge was held but she improved and did not vomit today so she will be discharged today  Dehydration -Improving. -IVF Rate decreased to 50 mL/hr now we will continue while patient is still hospitalized  Stage IV Neuroendocrine Cancer appearing carcinoma with a small amount of large cell features -Aggressive features, has had 2nd opinion at [facility].  -Has lung nodules with mediastinal lymphadenopathy and T10 and rib involvement diagnosed in 68 -Progressing first round of chemotherapy and secondary chemotherapy 6 cycles and is now on maintenance therapy with Alimta and Keytruda -F/b Dr Julien Nordmann here.  -C/w IV pain meds prn for bony mets mostly; C/w 2-4 mg IV q3hprn Moderate Pain and -C/w Lidocaine-Prilocaine 1 app Topically  -Outpatient palliative care [facility] [place]  [facility] -Nutrition Counseling and dietitian has placed in boost breeze 3 times daily along with a daily multivitamin with minerals -Will obtain PT/OT and they recommended no OT or PT follow-up -C/w IVF but reduce rate to 50 mL/hr and advance Diet as Tolerated   H/o possible DVT of port-a-cath  -Heparin drip now stopped -Cont po warfarin and try to get INR up if pt can keep pills down -Last INR was  4.0 and will hold tonight's warfarin dose -Repeat INR in a.m. as an outpatient at the clinic  Macrocytic Anemia -Patient's Hb/Hct went from 10.8/32.9 -> 9.3/30.2 -> 9.1/28.7 -> 9.3/28.5 -Check Anemia Panel showed iron level of 100, U IBC of 88, TIBC of 188, saturation ratios of 53%, ferritin level 1260, folate level 12.2, vitamin B12 level 600 -Continue to Monitor for S/Sx of Bleeding; Currently no Overt Bleeding noted -Repeat CBC as an outpatient  Hypomagnesemia -Patient's Mag Level this AM was 1.6 -Replete with IV mag sulfate 2 g -Continue to Monitor and Replete as Necessary -Repeat Mag Level in AM   Hypo-kalemia -Patient's potassium this morning was 3.0 -Replete with IV KCl 40 mEQ x2 -  Continue monitor and replete as necessary -Magnesium levels 1.9 -Repeat CMP as an outpatient  Abnormal and elevated AST -Improved and now AST is now 33 Continue monitor and trend CMP as an outpatient  Hypophosphatemia -Patient's phosphorus levels morning was 2.4 -Replete with p.o. K-Phos Neutral 500 mg x 1 Continue monitor replete as necessary -Repeat phosphorus level in outpatient setting  Discharge Instructions Discharge Instructions    Call MD for:  difficulty breathing, headache or visual disturbances   Complete by: As directed    Call MD for:  difficulty breathing, headache or visual disturbances   Complete by: As directed    Call MD for:  extreme fatigue   Complete by: As directed    Call MD for:  extreme fatigue   Complete by: As  directed    Call MD for:  hives   Complete by: As directed    Call MD for:  hives   Complete by: As directed    Call MD for:  persistant dizziness or light-headedness   Complete by: As directed    Call MD for:  persistant dizziness or light-headedness   Complete by: As directed    Call MD for:  persistant nausea and vomiting   Complete by: As directed    Call MD for:  persistant nausea and vomiting   Complete by: As directed    Call MD for:  redness, tenderness, or signs of infection (pain, swelling, redness, odor or green/yellow discharge around incision site)   Complete by: As directed    Call MD for:  redness, tenderness, or signs of infection (pain, swelling, redness, odor or green/yellow discharge around incision site)   Complete by: As directed    Call MD for:  severe uncontrolled pain   Complete by: As directed    Call MD for:  severe uncontrolled pain   Complete by: As directed    Call MD for:  temperature >100.4   Complete by: As directed    Call MD for:  temperature >100.4   Complete by: As directed    Diet - low sodium heart healthy   Complete by: As directed    Diet - low sodium heart healthy   Complete by: As directed    Discharge instructions   Complete by: As directed    You were cared for by a hospitalist during your hospital stay. If you have any questions about your discharge medications or the care you received while you were in the hospital after you are discharged, you can call the unit and ask to speak with the hospitalist on call if the hospitalist that took care of you is not available. Once you are discharged, your primary care physician will handle any further medical issues. Please note that NO REFILLS for any discharge medications will be authorized once you are discharged, as it is imperative that you return to your primary care physician (or establish a relationship with a primary care physician if you do not have one) for your aftercare needs so that  they can reassess your need for medications and monitor your lab values.  Follow up with PCP, Medical Oncology, and Radiation Oncology. Take all medications as prescribed. If symptoms change or worsen please return to the ED for evaluation   Discharge instructions   Complete by: As directed    You were cared for by a hospitalist during your hospital stay. If you have any questions about your discharge medications or the care you received while you were  in the hospital after you are discharged, you can call the unit and ask to speak with the hospitalist on call if the hospitalist that took care of you is not available. Once you are discharged, your primary care physician will handle any further medical issues. Please note that NO REFILLS for any discharge medications will be authorized once you are discharged, as it is imperative that you return to your primary care physician (or establish a relationship with a primary care physician if you do not have one) for your aftercare needs so that they can reassess your need for medications and monitor your lab values.  Follow up with PCP, Medical Oncology, and follow up with [facility] [place]. Take all medications as prescribed. If symptoms change or worsen please return to the ED for evaluation   Increase activity slowly   Complete by: As directed    Increase activity slowly   Complete by: As directed      Allergies as of 05/07/2019      Reactions   Barium Sulfate Diarrhea   Hypersensitivity intolerance reaction of stomach cramps and diarrhea.      Medication List    STOP taking these medications   loperamide 2 MG capsule Commonly known as: IMODIUM   polyethylene glycol 17 g packet Commonly known as: MIRALAX / GLYCOLAX     TAKE these medications   ALPRAZolam 0.25 MG tablet Commonly known as: XANAX Take 1 tablet (0.25 mg total) by mouth 2 (two) times daily as needed for anxiety.   bisacodyl 5 MG EC tablet Generic  drug: bisacodyl Take 5 mg by mouth daily as needed for moderate constipation.   folic acid 1 MG tablet Commonly known as: FOLVITE Take 1 tablet (1 mg total) by mouth daily. What changed: when to take this   gabapentin 300 MG capsule Commonly known as: NEURONTIN Take 300 mg by mouth daily as needed (pain).   Gerhardt's butt cream Crea Apply 1 application topically daily.   hydrOXYzine 25 MG tablet Commonly known as: ATARAX/VISTARIL Take 1 tablet (25 mg total) by mouth 3 (three) times daily as needed for nausea (use Zofran 1st).   lidocaine-prilocaine cream Commonly known as: EMLA Apply 1 application topically as needed. What changed: reasons to take this   magic mouthwash Soln Take 5 mLs by mouth 4 (four) times daily as needed for mouth pain. Components - Hydrocortisone 60 mg, Nystatin suspension 30 ml , Benadryl 12.5mg  /5 ml QS 240 ml.   magnesium oxide 400 (241.3 Mg) MG tablet Commonly known as: MAG-OX Take 1 tablet (400 mg total) by mouth 2 (two) times daily.   Melatonin 10 MG Tabs Take 10 mg by mouth at bedtime.   multivitamin with minerals Tabs tablet Take 1 tablet by mouth daily.   omeprazole 40 MG capsule Commonly known as: PRILOSEC Take 1 capsule (40 mg total) by mouth daily.   oxyCODONE 5 MG immediate release tablet Commonly known as: Oxy IR/ROXICODONE Take 1-2 tablets (5-10 mg total) by mouth every 4 (four) hours as needed for severe pain.   potassium chloride 10 MEQ tablet Commonly known as: K-DUR Take 1 tablet (10 mEq total) by mouth 2 (two) times daily.   prochlorperazine 10 MG tablet Commonly known as: COMPAZINE Take 1 tablet (10 mg total) by mouth every 6 (six) hours as needed for nausea or vomiting.   warfarin 5 MG tablet Commonly known as: COUMADIN TAKE 1 TABLET(5 MG) BY MOUTH DAILY Start taking on: May 08, 2019 What changed: See the new instructions.      Follow-up Information    [facility] Follow up.   Why: Needs  follow-up labs to recheck Potassium level and PT-INR on Wednesday, May 08, 2019       Alison Koch, MD. Call.   Specialty: Internal Medicine Contact information: [place] [id number] [place].   Why: [facility] information: [place] [address] [phone]        Curt Bears, MD. Call.   Specialty: Oncology Why: Follow up within 1 week  Contact information: [place] [address] [phone]          Allergies  Allergen Reactions  . Barium Sulfate Diarrhea    Hypersensitivity intolerance reaction of stomach cramps and diarrhea.   Consultations:  None  Procedures/Studies: Dg Ribs Unilateral W/chest Left  Result Date: 04/24/2019 CLINICAL DATA:  Known rib metastasis from lung cancer. EXAM: LEFT RIBS AND CHEST - 3+ VIEW COMPARISON:  CT chest 04/11/2019 FINDINGS: LEFT tenth rib metastasis better seen on CT, permeative changes, likely pathologic fracture. LEFT pleural effusion may be mildly increased. Sclerosis RIGHT T10 vertebral body, likely tumor. Port-A-Cath. BILATERAL lung pulmonary nodules probably unchanged when correlated with prior CT. IMPRESSION: LEFT tenth rib metastasis better seen on CT, likely pathologic fracture. LEFT pleural effusion may be mildly increased. Electronically Signed   By: Staci Righter M.D.   On: 04/24/2019 11:30   Ct Chest W Contrast  Result Date: 04/11/2019 CLINICAL DATA:  Non-small cell lung cancer restaging EXAM: CT CHEST, ABDOMEN, AND PELVIS WITH CONTRAST TECHNIQUE: Multidetector CT imaging of the chest, abdomen and pelvis was performed following the standard protocol during bolus administration of intravenous contrast. CONTRAST:  181mL OMNIPAQUE IOHEXOL 300 MG/ML  SOLN COMPARISON:  Multiple exams, including CT a chest from 03/04/2019 and CT abdomen from  02/26/2019 FINDINGS: CT CHEST FINDINGS Cardiovascular: Right Port-A-Cath tip: SVC. Thoracic aortic atherosclerotic vascular calcification. Mediastinum/Nodes: Left periaortic tumor measures 1.8 by 4.1 cm, formerly 1.8 by 3.8 cm. A necrotic pericardial mass abutting the pericardial apex measures 2.6 by 3.5 cm, formerly 2.8 by 3.1 cm by my measurements. Lungs/Pleura: Bandlike and nodular density at the right lung apex is not appreciably changed, and measures about 1.3 cm in thickness on image 33/7, formerly the same by my measurements. There is some confluent sub solid opacity in the superior segment right lower lobe which is bandlike and stable. Left apically scarring is stable. A left upper lobe pulmonary nodule measures 1.9 by 1.1 cm on image 64/7, formerly 1.8 by 1.2 cm. New small left pleural effusion, nonspecific for transudative or exudative etiology. Musculoskeletal: Ill-defined sclerosis in the right posterior T10 vertebral body, extending into the right pedicle, similar to the prior exam. Irregular sclerosis and periostitis of the left tenth rib probably with an associated pathologic fracture, and extensive surrounding enhancing soft tissue mass measuring up to 2.8 cm in thickness compatible with tumor. On 02/26/2019 this measured up to 2.2 cm in thickness. CT ABDOMEN PELVIS FINDINGS Hepatobiliary: Stable tiny hepatic hypodensities in the lateral segment left hepatic lobe and in segment 7 of the liver are technically too small to characterize although statistically likely to be small cysts or similar benign lesions, not appreciably changed from 02/26/2019. Gallbladder unremarkable. No significant biliary dilatation. Pancreas: Unremarkable Spleen: Unremarkable Adrenals/Urinary Tract:  Scattered cysts throughout the kidneys are most cases technically too small to characterize and seem to have varying complexity. In particular the 2.0 by 1.9 cm lesion in the right mid to lower kidney on image 73/2 probably has  internal septations and complex components. Scattered bilateral nonobstructive nephrolithiasis including a dominant clustered calcification in the right kidney lower pole measuring up to 0.8 cm in long axis on image 77/2. There is circumferential accentuated enhancement and borderline wall thickening of the right renal collecting system, right kidney lower pole infundibulum, and right ureter extending down to the urinary bladder, without hydronephrosis or hydroureter. This is likely inflammatory given the length of involvement. Stomach/Bowel: Descending and sigmoid colon diverticulosis. Previous intramural abscess along the sigmoid colon is no longer well seen. Vascular/Lymphatic: Aortoiliac atherosclerotic vascular disease. Chronic mural thrombus in the lower abdominal aorta. Reproductive: 1.1 cm right adnexal cystic lesion, unchanged from previous. Otherwise unremarkable. Other: No supplemental non-categorized findings. Musculoskeletal: Unremarkable IMPRESSION: 1. Compared to CT examinations from last month, the left periaortic and left pericardial dominant tumors appear stable, and the bandlike nodular density at the right apex is likewise unchanged. Stable confluent sub solid opacity in the superior segment right lower lobe. Stable left upper lobe pulmonary nodule. 2. The intramural abscess along the sigmoid colon is no longer visible. Considerable descending and sigmoid colon diverticulosis. 3. New small left pleural effusion, nonspecific for transudative or exudative etiology. 4. Increase in soft tissue tumor surrounding the metastatic lesion in the left tenth rib. Stable ill-defined sclerosis in the right posterior T10 vertebral body extending into the right pedicle, favoring malignancy. 5. Scattered cystic lesions of varying complexity throughout both kidneys. Cystic neoplasm difficult to exclude in some locations, many of these lesions are technically too small to characterize. Surveillance is likely  warranted. 6. New circumferential wall thickening in the right renal collecting system and right ureter as well as a right kidney lower pole infundibulum. This is most likely inflammatory given the long segment of involvement, and could be from a recent stone passage. There is also bilateral nonobstructive nephrolithiasis. 7. Other imaging findings of potential clinical significance: Aortic Atherosclerosis (ICD10-I70.0). Stable tiny hepatic hypodensities are likely benign. Electronically Signed   By: Van Clines M.D.   On: 04/11/2019 11:08   Ct Abdomen Pelvis W Contrast  Result Date: 04/11/2019 CLINICAL DATA:  Non-small cell lung cancer restaging EXAM: CT CHEST, ABDOMEN, AND PELVIS WITH CONTRAST TECHNIQUE: Multidetector CT imaging of the chest, abdomen and pelvis was performed following the standard protocol during bolus administration of intravenous contrast. CONTRAST:  164mL OMNIPAQUE IOHEXOL 300 MG/ML  SOLN COMPARISON:  Multiple exams, including CT a chest from 03/04/2019 and CT abdomen from 02/26/2019 FINDINGS: CT CHEST FINDINGS Cardiovascular: Right Port-A-Cath tip: SVC. Thoracic aortic atherosclerotic vascular calcification. Mediastinum/Nodes: Left periaortic tumor measures 1.8 by 4.1 cm, formerly 1.8 by 3.8 cm. A necrotic pericardial mass abutting the pericardial apex measures 2.6 by 3.5 cm, formerly 2.8 by 3.1 cm by my measurements. Lungs/Pleura: Bandlike and nodular density at the right lung apex is not appreciably changed, and measures about 1.3 cm in thickness on image 33/7, formerly the same by my measurements. There is some confluent sub solid opacity in the superior segment right lower lobe which is bandlike and stable. Left apically scarring is stable. A left upper lobe pulmonary nodule measures 1.9 by 1.1 cm on image 64/7, formerly 1.8 by 1.2 cm. New small left pleural effusion, nonspecific for transudative or exudative etiology. Musculoskeletal: Ill-defined sclerosis in the right  posterior T10 vertebral body, extending into the right pedicle, similar to the prior exam. Irregular sclerosis and periostitis of the left tenth rib probably with an associated pathologic fracture, and extensive surrounding enhancing soft tissue mass measuring up to 2.8 cm in thickness compatible with tumor. On 02/26/2019 this measured up to 2.2 cm in thickness. CT ABDOMEN PELVIS FINDINGS Hepatobiliary: Stable tiny hepatic hypodensities in the lateral segment left hepatic lobe and in segment 7 of the liver are technically too small to characterize although statistically likely to be small cysts or similar benign lesions, not appreciably changed from 02/26/2019. Gallbladder unremarkable. No significant biliary dilatation. Pancreas: Unremarkable Spleen: Unremarkable Adrenals/Urinary Tract: Scattered cysts throughout the kidneys are most cases technically too small to characterize and seem to have varying complexity. In particular the 2.0 by 1.9 cm lesion in the right mid to lower kidney on image 73/2 probably has internal septations and complex components. Scattered bilateral nonobstructive nephrolithiasis including a dominant clustered calcification in the right kidney lower pole measuring up to 0.8 cm in long axis on image 77/2. There is circumferential accentuated enhancement and borderline wall thickening of the right renal collecting system, right kidney lower pole infundibulum, and right ureter extending down to the urinary bladder, without hydronephrosis or hydroureter. This is likely inflammatory given the length of involvement. Stomach/Bowel: Descending and sigmoid colon diverticulosis. Previous intramural abscess along the sigmoid colon is no longer well seen. Vascular/Lymphatic: Aortoiliac atherosclerotic vascular disease. Chronic mural thrombus in the lower abdominal aorta. Reproductive: 1.1 cm right adnexal cystic lesion, unchanged from previous. Otherwise unremarkable. Other: No supplemental  non-categorized findings. Musculoskeletal: Unremarkable IMPRESSION: 1. Compared to CT examinations from last month, the left periaortic and left pericardial dominant tumors appear stable, and the bandlike nodular density at the right apex is likewise unchanged. Stable confluent sub solid opacity in the superior segment right lower lobe. Stable left upper lobe pulmonary nodule. 2. The intramural abscess along the sigmoid colon is no longer visible. Considerable descending and sigmoid colon diverticulosis. 3. New small left pleural effusion, nonspecific for transudative or exudative etiology. 4. Increase in soft tissue tumor surrounding the metastatic lesion in the left tenth rib. Stable ill-defined sclerosis in the right posterior T10 vertebral body extending into the right pedicle, favoring malignancy. 5. Scattered cystic lesions of varying complexity throughout both kidneys. Cystic neoplasm difficult to exclude in some locations, many of these lesions are technically too small to characterize. Surveillance is likely warranted. 6. New circumferential wall thickening in the right renal collecting system and right ureter as well as a right kidney lower pole infundibulum. This is most likely inflammatory given the long segment of involvement, and could be from a recent stone passage. There is also bilateral nonobstructive nephrolithiasis. 7. Other imaging findings of potential clinical significance: Aortic Atherosclerosis (ICD10-I70.0). Stable tiny hepatic hypodensities are likely benign. Electronically Signed   By: Van Clines M.D.   On: 04/11/2019 11:08    Subjective: Seen and examined at bedside and she denied any chest pain, lightheadedness or dizziness.  No nausea or vomiting now and states that she gagged on her potassium pill yesterday that made her vomit.  No other concerns or complaints at this time and COVID test was still pending at this time.  Discharge Exam: Vitals:   05/07/19 0514 05/07/19  1517  BP: 131/87 118/65  Pulse: 82 92  Resp: 14 16  Temp: 98.6 F (37 C) 98.3 F (36.8 C)  SpO2: 94% 96%   Vitals:   05/06/19 1555 05/06/19  2235 05/07/19 0514 05/07/19 1517  BP: (!) 150/98 119/69 131/87 118/65  Pulse: 86 86 82 92  Resp: 18 20 14 16   Temp: 98.4 F (36.9 C) 98.9 F (37.2 C) 98.6 F (37 C) 98.3 F (36.8 C)  TempSrc: Oral Oral Oral Oral  SpO2: 92% 96% 94% 96%  Weight:   67 kg   Height:       General: Pt is alert, awake, not in acute distress; chornically ill appearing Cardiovascular: RRR, S1/S2 +, no rubs, no gallops Respiratory: Diminished bilaterally, no wheezing, no rhonchi Abdominal: Soft, NT, ND, bowel sounds + Extremities: no edema, no cyanosis  The results of significant diagnostics from this hospitalization (including imaging, microbiology, ancillary and laboratory) are listed below for reference.    Microbiology: No results found for this or any previous visit (from the past 240 hour(s)).   Labs: BNP (last 3 results) No results for input(s): BNP in the last 8760 hours. Basic Metabolic Panel: Recent Labs  Lab 05/04/19 2023 05/05/19 0800 05/06/19 0823 05/07/19 0535  NA 139 141 138 138  K 3.3* 3.8 2.8* 3.0*  CL 104 112* 108 108  CO2 23 22 21* 22  GLUCOSE 102* 89 90 114*  BUN 14 10 5* 5*  CREATININE 0.80 0.70 0.61 0.71  CALCIUM 8.7* 7.5* 8.0* 7.9*  MG 1.9 1.5* 1.9 1.6*  PHOS 2.7 2.5 2.8 2.4*   Liver Function Tests: Recent Labs  Lab 05/04/19 2023 05/05/19 0800 05/06/19 0823 05/07/19 0535  AST 47* 39 39 33  ALT 25 20 20 17   ALKPHOS 110 91 89 88  BILITOT 0.6 0.7 0.6 0.4  PROT 7.2 5.9* 6.2* 5.8*  ALBUMIN 3.1* 2.5* 2.8* 2.5*   No results for input(s): LIPASE, AMYLASE in the last 168 hours. No results for input(s): AMMONIA in the last 168 hours. CBC: Recent Labs  Lab 05/04/19 2023 05/05/19 0800 05/06/19 0414 05/07/19 0535  WBC 5.8 4.6 4.2 4.2  NEUTROABS 3.8 2.8  --  2.6  HGB 10.8* 9.3* 9.1* 9.3*  HCT 32.9* 30.2* 28.7* 28.5*   MCV 101.5* 104.9* 104.4* 102.9*  PLT 231 206 200 194   Cardiac Enzymes: Recent Labs  Lab 05/04/19 2024  TROPONINI <0.03   BNP: Invalid input(s): POCBNP CBG: No results for input(s): GLUCAP in the last 168 hours. D-Dimer No results for input(s): DDIMER in the last 72 hours. Hgb A1c No results for input(s): HGBA1C in the last 72 hours. Lipid Profile No results for input(s): CHOL, HDL, LDLCALC, TRIG, CHOLHDL, LDLDIRECT in the last 72 hours. Thyroid function studies No results for input(s): TSH, T4TOTAL, T3FREE, THYROIDAB in the last 72 hours.  Invalid input(s): FREET3 Anemia work up Recent Labs    05/07/19 0535 05/07/19 0541  VITAMINB12  --  600  FOLATE  --  12.2  FERRITIN  --  1,260*  TIBC  --  188*  IRON  --  100  RETICCTPCT 3.4*  --    Urinalysis    Component Value Date/Time   COLORURINE STRAW (A) 05/05/2019 0051   APPEARANCEUR CLEAR 05/05/2019 0051   LABSPEC 1.006 05/05/2019 [place] 7.0 05/05/2019 0051   GLUCOSEU NEGATIVE 05/05/2019 0051   GLUCOSEU NEGATIVE 07/05/2017 1136   HGBUR NEGATIVE 05/05/2019 0051   BILIRUBINUR NEGATIVE 05/05/2019 0051   BILIRUBINUR neg 03/16/2018 1530   KETONESUR 5 (A) 05/05/2019 0051   PROTEINUR NEGATIVE 05/05/2019 0051   UROBILINOGEN 0.2 03/16/2018 1530   UROBILINOGEN 0.2 07/05/2017 1136   NITRITE NEGATIVE 05/05/2019 0051  LEUKOCYTESUR NEGATIVE 05/05/2019 0051   Sepsis Labs Invalid input(s): PROCALCITONIN,  WBC,  LACTICIDVEN Microbiology No results found for this or any previous visit (from the past 240 hour(s)).  Time coordinating discharge: 35 minutes  SIGNED:  Kerney Elbe, DO [facility] 05/07/2019, 9:59 PM Pager is on [place]  If 7PM-7AM, please contact night-coverage [email or web address] Password [email or web address]

## 2019-05-06 NOTE — Progress Notes (Addendum)
Radiation treatment cancelled today until Covid test has resulted.  Patient and Dr. Alfredia Ferguson notified.   Dr Alfredia Ferguson notified of son requesting Palliative consult.

## 2019-05-06 NOTE — Progress Notes (Signed)
[place] for warfarin Indication: Recent DVT; INR subtherapeutic on admission  Allergies  Allergen Reactions  . Barium Sulfate Diarrhea    Hypersensitivity intolerance reaction of stomach cramps and diarrhea.    Patient Measurements: Height: 5\' 6"  (167.6 cm) Weight: 135 lb 9.6 oz (61.5 kg) IBW/kg (Calculated) : 59.3 Heparin Dosing Weight: 61 kg  Vital Signs: Temp: 98.4 F (36.9 C) (06/22 0629) Temp Source: Oral (06/22 0629) BP: 127/69 (06/22 0629) Pulse Rate: 72 (06/22 0629)  Labs: Recent Labs    05/04/19 2023 05/04/19 2024 05/05/19 0800 05/05/19 1652 05/06/19 0414  HGB 10.8*  --  9.3*  --  9.1*  HCT 32.9*  --  30.2*  --  28.7*  PLT 231  --  206  --  200  LABPROT 20.5*  --  22.7*  --  32.6*  INR 1.8*  --  2.0*  --  3.2*  HEPARINUNFRC  --   --  0.58 0.78* 0.61  CREATININE 0.80  --  0.70  --   --   TROPONINI  --  <0.03  --   --   --     Estimated Creatinine Clearance: 63 mL/min (by C-G formula based on SCr of 0.7 mg/dL).   Medical History: Past Medical History:  Diagnosis Date  . Arthritis   . Chronic kidney disease   . Dog bite of wrist   . History of kidney stones   . History of radiation therapy 10/23/18- 11/06/18   Left lower rib mass/ 30 Gy delivered in 10 fractions 3 Gy.   . Kidney infection   . Left renal mass   . Lung cancer ([place])   . Metastatic bone cancer ([place])   . Osteopenia   . Pyelonephritis   . Recurrent UTI (urinary tract infection)      Assessment: 68 yo F on warfarin for recent Port-A-Cath related VTE, has been unable to keep down her meds, including warfarin, for past 3-4 days due to N/V.   Home warfarin dose: 5 mg qday. Last dose 6/16 or 6/17 - pt unsure.  Admission INR 1.8.  Hg 10.8, PLTC 231.  No bleeding reported.    Her INR went up to 3.2 today. We will dc IV heparin after d/w Dr. Alfredia Ferguson. This is likely due to poor intake. We will hold coumadin today.   Goal of Therapy:  INR 2-3 Heparin  level 0.3-0.7 units/ml Monitor platelets by anticoagulation protocol: Yes   Plan:   Dc heparin, HL No coumadin today Cont daily INR  Onnie Boer, PharmD, BCIDP, AAHIVP, CPP Infectious Disease Pharmacist 05/06/2019 10:27 AM

## 2019-05-07 ENCOUNTER — Ambulatory Visit: Payer: Medicare Other

## 2019-05-07 LAB — COMPREHENSIVE METABOLIC PANEL
ALT: 17 U/L (ref 0–44)
AST: 33 U/L (ref 15–41)
Albumin: 2.5 g/dL — ABNORMAL LOW (ref 3.5–5.0)
Alkaline Phosphatase: 88 U/L (ref 38–126)
Anion gap: 8 (ref 5–15)
BUN: 5 mg/dL — ABNORMAL LOW (ref 8–23)
CO2: 22 mmol/L (ref 22–32)
Calcium: 7.9 mg/dL — ABNORMAL LOW (ref 8.9–10.3)
Chloride: 108 mmol/L (ref 98–111)
Creatinine, Ser: 0.71 mg/dL (ref 0.44–1.00)
GFR calc Af Amer: >60 mL/min (ref >60)
GFR calc non Af Amer: >60 mL/min (ref >60)
Glucose, Bld: 114 mg/dL — ABNORMAL HIGH (ref 70–99)
Potassium: 3 mmol/L — ABNORMAL LOW (ref 3.5–5.1)
Sodium: 138 mmol/L (ref 135–145)
Total Bilirubin: 0.4 mg/dL (ref 0.3–1.2)
Total Protein: 5.8 g/dL — ABNORMAL LOW (ref 6.5–8.1)

## 2019-05-07 LAB — IRON AND TIBC
Iron: 100 ug/dL (ref 28–170)
Saturation Ratios: 53 % — ABNORMAL HIGH (ref 10.4–31.8)
TIBC: 188 ug/dL — ABNORMAL LOW (ref 250–450)
UIBC: 88 ug/dL

## 2019-05-07 LAB — CBC WITH DIFFERENTIAL/PLATELET
Abs Immature Granulocytes: 0.05 10*3/uL (ref 0.00–0.07)
Basophils Absolute: 0 10*3/uL (ref 0.0–0.1)
Basophils Relative: 1 %
Eosinophils Absolute: 0.1 10*3/uL (ref 0.0–0.5)
Eosinophils Relative: 2 %
HCT: 28.5 % — ABNORMAL LOW (ref 36.0–46.0)
Hemoglobin: 9.3 g/dL — ABNORMAL LOW (ref 12.0–15.0)
Immature Granulocytes: 1 %
Lymphocytes Relative: 15 %
Lymphs Abs: 0.6 10*3/uL — ABNORMAL LOW (ref 0.7–4.0)
MCH: 33.6 pg (ref 26.0–34.0)
MCHC: 32.6 g/dL (ref 30.0–36.0)
MCV: 102.9 fL — ABNORMAL HIGH (ref 80.0–100.0)
Monocytes Absolute: 0.8 10*3/uL (ref 0.1–1.0)
Monocytes Relative: 19 %
Neutro Abs: 2.6 10*3/uL (ref 1.7–7.7)
Neutrophils Relative %: 62 %
Platelets: 194 10*3/uL (ref 150–400)
RBC: 2.77 MIL/uL — ABNORMAL LOW (ref 3.87–5.11)
RDW: 20.3 % — ABNORMAL HIGH (ref 11.5–15.5)
WBC: 4.2 10*3/uL (ref 4.0–10.5)
nRBC: 0 % (ref 0.0–0.2)

## 2019-05-07 LAB — FERRITIN: Ferritin: 1260 ng/mL — ABNORMAL HIGH (ref 11–307)

## 2019-05-07 LAB — RETICULOCYTES
Immature Retic Fract: 28.4 % — ABNORMAL HIGH (ref 2.3–15.9)
RBC.: 2.77 MIL/uL — ABNORMAL LOW (ref 3.87–5.11)
Retic Count, Absolute: 93.3 10*3/uL (ref 19.0–186.0)
Retic Ct Pct: 3.4 % — ABNORMAL HIGH (ref 0.4–3.1)

## 2019-05-07 LAB — PROTIME-INR
INR: 4 — ABNORMAL HIGH (ref 0.8–1.2)
Prothrombin Time: 38.5 seconds — ABNORMAL HIGH (ref 11.4–15.2)

## 2019-05-07 LAB — VITAMIN B12: Vitamin B-12: 600 pg/mL (ref 180–914)

## 2019-05-07 LAB — FOLATE: Folate: 12.2 ng/mL (ref >5.9)

## 2019-05-07 LAB — MAGNESIUM: Magnesium: 1.6 mg/dL — ABNORMAL LOW (ref 1.7–2.4)

## 2019-05-07 LAB — PHOSPHORUS: Phosphorus: 2.4 mg/dL — ABNORMAL LOW (ref 2.5–4.6)

## 2019-05-07 MED ORDER — MAGNESIUM SULFATE 2 GM/50ML IV SOLN
2.0000 g | Freq: Once | INTRAVENOUS | Status: AC
  Administered 2019-05-07: 2 g via INTRAVENOUS
  Filled 2019-05-07: qty 50

## 2019-05-07 MED ORDER — WARFARIN SODIUM 5 MG PO TABS: ORAL_TABLET | ORAL | 1 refills | Status: AC

## 2019-05-07 MED ORDER — POTASSIUM CHLORIDE 20 MEQ/15ML (10%) PO SOLN
40.0000 meq | Freq: Once | ORAL | Status: DC
  Filled 2019-05-07: qty 30

## 2019-05-07 MED ORDER — HEPARIN SOD (PORK) LOCK FLUSH 100 UNIT/ML IV SOLN: 500.0000 [IU] | INTRAVENOUS | Status: DC | PRN

## 2019-05-07 MED ORDER — K PHOS MONO-SOD PHOS DI & MONO 155-852-130 MG PO TABS
500.0000 mg | ORAL_TABLET | Freq: Two times a day (BID) | ORAL | Status: DC
  Filled 2019-05-07: qty 2

## 2019-05-07 MED ORDER — POTASSIUM CHLORIDE 10 MEQ/100ML IV SOLN
10.0000 meq | INTRAVENOUS | Status: AC
  Administered 2019-05-07 (×4): 10 meq via INTRAVENOUS
  Filled 2019-05-07 (×3): qty 100

## 2019-05-07 NOTE — Progress Notes (Signed)
[place] for warfarin Indication: Recent DVT; INR subtherapeutic on admission  Allergies  Allergen Reactions  . Barium Sulfate Diarrhea    Hypersensitivity intolerance reaction of stomach cramps and diarrhea.    Patient Measurements: Height: 5\' 6"  (167.6 cm) Weight: 147 lb 11.2 oz (67 kg) IBW/kg (Calculated) : 59.3 Heparin Dosing Weight: 61 kg  Vital Signs: Temp: 98.6 F (37 C) (06/23 0514) Temp Source: Oral (06/23 0514) BP: 131/87 (06/23 0514) Pulse Rate: 82 (06/23 0514)  Labs: Recent Labs    05/04/19 2024 05/05/19 0800 05/05/19 1652 05/06/19 0414 05/06/19 0823 05/07/19 0535  HGB  --  9.3*  --  9.1*  --  9.3*  HCT  --  30.2*  --  28.7*  --  28.5*  PLT  --  206  --  200  --  194  LABPROT  --  22.7*  --  32.6*  --  38.5*  INR  --  2.0*  --  3.2*  --  4.0*  HEPARINUNFRC  --  0.58 0.78* 0.61  --   --   CREATININE  --  0.70  --   --  0.61 0.71  TROPONINI <0.03  --   --   --   --   --     Estimated Creatinine Clearance: 63 mL/min (by C-G formula based on SCr of 0.71 mg/dL).   Medical History: Past Medical History:  Diagnosis Date  . Arthritis   . Chronic kidney disease   . Dog bite of wrist   . History of kidney stones   . History of radiation therapy 10/23/18- 11/06/18   Left lower rib mass/ 30 Gy delivered in 10 fractions 3 Gy.   . Kidney infection   . Left renal mass   . Lung cancer ([place])   . Metastatic bone cancer ([place])   . Osteopenia   . Pyelonephritis   . Recurrent UTI (urinary tract infection)      Assessment: 68 yo F on warfarin for recent Port-A-Cath related VTE, has been unable to keep down her meds, including warfarin, for past 3-4 days due to N/V.   Home warfarin dose: 5 mg qday. Last dose 6/16 or 6/17 - pt unsure.  Admission INR 1.8.  Hg 10.8, PLTC 231.  No bleeding reported.    Today, 05/07/2019 - INR continues to rise - Hgb low but steady, platelets WNL - No major DDIs - Pt on nutritional  supplement diet, drinking very little  Goal of Therapy:  INR 2-3 Heparin level 0.3-0.7 units/ml Monitor platelets by anticoagulation protocol: Yes   Plan:  - Hold coumadin x1 today - Daily INR - Watch for s/sx of bleeding

## 2019-05-07 NOTE — Evaluation (Addendum)
Occupational Therapy Evaluation Patient Details Name: Alison Sullivan MRN: [id number] DOB: [birth year] Today's Date: 05/07/2019    History of Present Illness 68 y.o. 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 Pt performing functional mobility in room pushing iv pole, completing standing grooming ADL (x3 tasks), toileting task all at mod independent level. Pt reports mild weakness and endorses increased fatigue with higher level/prolonged activity including some iADL tasks and longer distance mobility. Discussed energy conservation strategies to incorporate into daily routine to decrease levels of fatigue. Pt reports biggest concern at this time is getting to/from her radiation appointments. Pt reports she is able to drive herself, but has significant difficulty walking up the hill to the cancer center (due to [place] currently placed on hold). Discussed options/alternatives but pt may benefit from additional resources. Questions answered throughout session. Overall pt reports feeling comfortable completing ADL/simple iADL within her home. No further acute OT needs identified at this time. Do not anticipate pt will require follow up OT services at this time. Acute OT to sign off. Thank you for this referral.      Follow Up Recommendations  No OT follow up;Supervision - Intermittent    Equipment Recommendations  None recommended by OT           Precautions / Restrictions Precautions Precautions: Fall Restrictions Weight Bearing Restrictions: No      Mobility Bed Mobility Overal bed mobility: Modified Independent                Transfers Overall transfer level: Modified independent Equipment used: None                  Balance Overall balance  assessment: No apparent balance deficits (not formally assessed)                                         ADL either performed or assessed with clinical judgement   ADL Overall ADL's : At baseline;Modified independent                                       General ADL Comments: pt performing room level mobility pushing iv pole, standing grooming ADL x3, toileting task including clothing management of gown/pants/underwear all at mod independent level this session. pt reports she feels slightly off due to recenting taking pain meds but otherwise appears close to her baseline regarding ADL/mobility; also discussed energy conservation techniques as pt reports easily fatigued with higher level/prolonged activity                          Pertinent Vitals/Pain Pain Assessment: No/denies pain     Hand Dominance     Extremity/Trunk Assessment Upper Extremity Assessment Upper Extremity Assessment: Overall WFL for tasks assessed;Generalized weakness(mild weakness)   Lower Extremity Assessment Lower Extremity Assessment: Defer to PT evaluation;Overall [facility] for tasks assessed   Cervical / Trunk Assessment Cervical / Trunk Assessment: Normal   Communication Communication Communication: No difficulties   Cognition Arousal/Alertness: Awake/alert Behavior During Therapy: WFL for tasks assessed/performed Overall Cognitive Status:  Within Functional Limits for tasks assessed                                     General Comments       Exercises     Shoulder Instructions      Home Living Family/patient expects to be discharged to:: Private residence Living Arrangements: Alone Available Help at Discharge: Family Type of Home: House Home Access: Level entry     [place]: Two level;Able to live on main level with bedroom/bathroom     Bathroom Shower/Tub: Teacher, early years/pre: Standard     Home Equipment: None           Prior Functioning/Environment Level of Independence: Independent        Comments: has someone assist with iADL tasks such as lawncare, house cleaning 1/2 day each week        OT Problem List: Decreased activity tolerance;Decreased knowledge of use of DME or AE;Decreased strength      OT Treatment/Interventions:      OT Goals(Current goals can be found in the care plan section) Acute Rehab OT Goals Patient Stated Goal: maintain her independence OT Goal Formulation: All assessment and education complete, DC therapy  OT Frequency:     Barriers to D/C:            Co-evaluation              AM-PAC OT "6 Clicks" Daily Activity     Outcome Measure Help from another person eating meals?: None Help from another person taking care of personal grooming?: None Help from another person toileting, which includes using toliet, bedpan, or urinal?: None Help from another person bathing (including washing, rinsing, drying)?: None Help from another person to put on and taking off regular upper body clothing?: None Help from another person to put on and taking off regular lower body clothing?: None 6 Click Score: 24   End of Session Nurse Communication: Mobility status  Activity Tolerance: Patient tolerated treatment well Patient left: in bed;with call bell/phone within reach  OT Visit Diagnosis: Muscle weakness (generalized) (M62.81);Other (comment)(decreased activity tolerance)                Time: 0737-1062 OT Time Calculation (min): 23 min Charges:  OT General Charges $OT Visit: 1 Visit OT Evaluation $OT Eval Moderate Complexity: [place], OT [employer] Pager [phone] Office [phone]   Raymondo Band 05/07/2019, 1:33 PM

## 2019-05-07 NOTE — Plan of Care (Signed)
  Problem: Education: Goal: Knowledge of General Education information will improve Description: Including pain rating scale, medication(s)/side effects and non-pharmacologic comfort measures Outcome: Progressing   Problem: Clinical Measurements: Goal: Will remain free from infection Outcome: Progressing Goal: Respiratory complications will improve Outcome: Progressing Goal: Cardiovascular complication will be avoided Outcome: Progressing   Problem: Coping: Goal: Level of anxiety will decrease Outcome: Progressing   Problem: Elimination: Goal: Will not experience complications related to bowel motility Outcome: Progressing Goal: Will not experience complications related to urinary retention Outcome: Progressing

## 2019-05-08 ENCOUNTER — Ambulatory Visit: Payer: Medicare Other

## 2019-05-09 ENCOUNTER — Ambulatory Visit: Payer: Medicare Other

## 2019-05-09 ENCOUNTER — Telehealth: Payer: Self-pay | Admitting: Internal Medicine

## 2019-05-09 LAB — NOVEL CORONAVIRUS, NAA (HOSP ORDER, SEND-OUT TO REF LAB; TAT 18-24 HRS): SARS-CoV-2, NAA: NOT DETECTED

## 2019-05-09 NOTE — Telephone Encounter (Signed)
Verbal orders given for below.  

## 2019-05-09 NOTE — Telephone Encounter (Signed)
Caller/Agency: Manus Gunning with [facility] Number: [phone] Requesting OT/PT/Skilled Nursing/Social Work/Speech Therapy: Palliative Care Frequency: PRN

## 2019-05-09 NOTE — Telephone Encounter (Signed)
Fine

## 2019-05-10 ENCOUNTER — Other Ambulatory Visit: Payer: Self-pay | Admitting: *Deleted

## 2019-05-10 ENCOUNTER — Other Ambulatory Visit: Payer: Self-pay | Admitting: Medical Oncology

## 2019-05-10 ENCOUNTER — Ambulatory Visit: Payer: Medicare Other

## 2019-05-10 DIAGNOSIS — C3492 Malignant neoplasm of unspecified part of left bronchus or lung: Secondary | ICD-10-CM

## 2019-05-10 NOTE — Patient Outreach (Signed)
[facility]) Care Management  05/10/2019  BENNETT VANSCYOC 06-22-1951 [id number]   Subjective: Telephone call to patient's home  / mobile number, no answer, left HIPAA compliant voicemail message, and requested call back.    Objective: Per KPN (Knowledge Performance Now, point of care tool) and chart review, patient hospitalized 05/04/2019 - 05/07/2019 for nausea vomiting,  hospitalized 04/15/2019 -04/19/2019 for Large cell carcinoma of left lung, stage 4, Neutropenia, Pancytopenia. Patient also has a history of metastatic high-grade neuroendocrine carcinoma, hyperlipidemia, nonocclusive thrombus under the Port-A-Cath, Chronic kidney disease, kidney stones, radiation therapy, Pyelonephritis and UTI.      Assessment: Received Medicare EMMI General Discharge Red Alert Flag follow up referral on 05/10/2019. Red Flag Alert triggers times 2, Day #1, patient answered no to the following questions: Transportation to follow-up?    Taking meds?   [facility] EMMI follow up pending patient contact.       Plan: RNCM will send unsuccessful outreach letter, [facility] pamphlet, handout: Know Before You Go, will call patient for 2nd telephone outreach attempt within 4 business days, [facility] EMMI follow up, and proceed with case closure, within 10 business days if no return call.      Jadien Lehigh H. Annia Friendly, BSN, [place] [facility] Telephonic CM Phone: [phone] Fax: [phone]

## 2019-05-13 ENCOUNTER — Inpatient Hospital Stay: Payer: Medicare Other

## 2019-05-13 ENCOUNTER — Encounter: Payer: Self-pay | Admitting: Internal Medicine

## 2019-05-13 ENCOUNTER — Inpatient Hospital Stay (HOSPITAL_BASED_OUTPATIENT_CLINIC_OR_DEPARTMENT_OTHER): Payer: Medicare Other | Admitting: Internal Medicine

## 2019-05-13 ENCOUNTER — Other Ambulatory Visit: Payer: Self-pay

## 2019-05-13 ENCOUNTER — Ambulatory Visit
Admission: RE | Admit: 2019-05-13 | Discharge: 2019-05-13 | Disposition: A | Discharge status: Home / Self Care | Payer: Medicare Other | Source: Ambulatory Visit | Attending: Radiation Oncology | Admitting: Radiation Oncology

## 2019-05-13 ENCOUNTER — Other Ambulatory Visit: Payer: Self-pay | Admitting: *Deleted

## 2019-05-13 VITALS — HR 91

## 2019-05-13 VITALS — BP 106/78 | HR 104 | Temp 98.5°F | Resp 18 | Ht 66.0 in | Wt 133.9 lb

## 2019-05-13 DIAGNOSIS — K5730 Diverticulosis of large intestine without perforation or abscess without bleeding: Secondary | ICD-10-CM | POA: Diagnosis not present

## 2019-05-13 DIAGNOSIS — R5383 Other fatigue: Secondary | ICD-10-CM | POA: Diagnosis not present

## 2019-05-13 DIAGNOSIS — N200 Calculus of kidney: Secondary | ICD-10-CM | POA: Diagnosis not present

## 2019-05-13 DIAGNOSIS — I700 Atherosclerosis of aorta: Secondary | ICD-10-CM | POA: Diagnosis not present

## 2019-05-13 DIAGNOSIS — Z87442 Personal history of urinary calculi: Secondary | ICD-10-CM | POA: Diagnosis not present

## 2019-05-13 DIAGNOSIS — Z79899 Other long term (current) drug therapy: Secondary | ICD-10-CM

## 2019-05-13 DIAGNOSIS — C3492 Malignant neoplasm of unspecified part of left bronchus or lung: Secondary | ICD-10-CM

## 2019-05-13 DIAGNOSIS — D61818 Other pancytopenia: Secondary | ICD-10-CM

## 2019-05-13 DIAGNOSIS — R0609 Other forms of dyspnea: Secondary | ICD-10-CM | POA: Diagnosis not present

## 2019-05-13 DIAGNOSIS — I513 Intracardiac thrombosis, not elsewhere classified: Secondary | ICD-10-CM | POA: Diagnosis not present

## 2019-05-13 DIAGNOSIS — C3412 Malignant neoplasm of upper lobe, left bronchus or lung: Secondary | ICD-10-CM

## 2019-05-13 DIAGNOSIS — E46 Unspecified protein-calorie malnutrition: Secondary | ICD-10-CM

## 2019-05-13 DIAGNOSIS — M8580 Other specified disorders of bone density and structure, unspecified site: Secondary | ICD-10-CM

## 2019-05-13 DIAGNOSIS — M1990 Unspecified osteoarthritis, unspecified site: Secondary | ICD-10-CM

## 2019-05-13 DIAGNOSIS — E876 Hypokalemia: Secondary | ICD-10-CM | POA: Diagnosis not present

## 2019-05-13 DIAGNOSIS — J90 Pleural effusion, not elsewhere classified: Secondary | ICD-10-CM | POA: Diagnosis not present

## 2019-05-13 DIAGNOSIS — E860 Dehydration: Secondary | ICD-10-CM | POA: Diagnosis not present

## 2019-05-13 DIAGNOSIS — Z5112 Encounter for antineoplastic immunotherapy: Secondary | ICD-10-CM

## 2019-05-13 DIAGNOSIS — C7951 Secondary malignant neoplasm of bone: Secondary | ICD-10-CM

## 2019-05-13 DIAGNOSIS — Z7901 Long term (current) use of anticoagulants: Secondary | ICD-10-CM | POA: Diagnosis not present

## 2019-05-13 DIAGNOSIS — M546 Pain in thoracic spine: Secondary | ICD-10-CM | POA: Diagnosis not present

## 2019-05-13 DIAGNOSIS — C7A1 Malignant poorly differentiated neuroendocrine tumors: Secondary | ICD-10-CM

## 2019-05-13 DIAGNOSIS — J029 Acute pharyngitis, unspecified: Secondary | ICD-10-CM | POA: Diagnosis not present

## 2019-05-13 DIAGNOSIS — R5382 Chronic fatigue, unspecified: Secondary | ICD-10-CM

## 2019-05-13 DIAGNOSIS — I8290 Acute embolism and thrombosis of unspecified vein: Secondary | ICD-10-CM

## 2019-05-13 DIAGNOSIS — F419 Anxiety disorder, unspecified: Secondary | ICD-10-CM | POA: Diagnosis not present

## 2019-05-13 DIAGNOSIS — C7A8 Other malignant neuroendocrine tumors: Secondary | ICD-10-CM

## 2019-05-13 DIAGNOSIS — Z923 Personal history of irradiation: Secondary | ICD-10-CM

## 2019-05-13 LAB — CBC WITH DIFFERENTIAL (CANCER CENTER ONLY)
Abs Immature Granulocytes: 0.04 10*3/uL (ref 0.00–0.07)
Basophils Absolute: 0 10*3/uL (ref 0.0–0.1)
Basophils Relative: 0 %
Eosinophils Absolute: 0.2 10*3/uL (ref 0.0–0.5)
Eosinophils Relative: 3 %
HCT: 33.4 % — ABNORMAL LOW (ref 36.0–46.0)
Hemoglobin: 10.9 g/dL — ABNORMAL LOW (ref 12.0–15.0)
Immature Granulocytes: 1 %
Lymphocytes Relative: 10 %
Lymphs Abs: 0.7 10*3/uL (ref 0.7–4.0)
MCH: 32.9 pg (ref 26.0–34.0)
MCHC: 32.6 g/dL (ref 30.0–36.0)
MCV: 100.9 fL — ABNORMAL HIGH (ref 80.0–100.0)
Monocytes Absolute: 0.8 10*3/uL (ref 0.1–1.0)
Monocytes Relative: 12 %
Neutro Abs: 5.2 10*3/uL (ref 1.7–7.7)
Neutrophils Relative %: 74 %
Platelet Count: 212 10*3/uL (ref 150–400)
RBC: 3.31 MIL/uL — ABNORMAL LOW (ref 3.87–5.11)
RDW: 20.2 % — ABNORMAL HIGH (ref 11.5–15.5)
WBC Count: 6.9 10*3/uL (ref 4.0–10.5)
nRBC: 0 % (ref 0.0–0.2)

## 2019-05-13 LAB — CMP (CANCER CENTER ONLY)
ALT: 18 U/L (ref 0–44)
AST: 36 U/L (ref 15–41)
Albumin: 2.9 g/dL — ABNORMAL LOW (ref 3.5–5.0)
Alkaline Phosphatase: 117 U/L (ref 38–126)
Anion gap: 13 (ref 5–15)
BUN: 17 mg/dL (ref 8–23)
CO2: 21 mmol/L — ABNORMAL LOW (ref 22–32)
Calcium: 8.6 mg/dL — ABNORMAL LOW (ref 8.9–10.3)
Chloride: 105 mmol/L (ref 98–111)
Creatinine: 1.09 mg/dL — ABNORMAL HIGH (ref 0.44–1.00)
GFR, Est AFR Am: >60 mL/min (ref >60)
GFR, Estimated: 52 mL/min — ABNORMAL LOW (ref >60)
Glucose, Bld: 168 mg/dL — ABNORMAL HIGH (ref 70–99)
Potassium: 2.5 mmol/L — CL (ref 3.5–5.1)
Sodium: 139 mmol/L (ref 135–145)
Total Bilirubin: 0.4 mg/dL (ref 0.3–1.2)
Total Protein: 7.3 g/dL (ref 6.5–8.1)

## 2019-05-13 LAB — TSH: TSH: 0.39 u[IU]/mL (ref 0.308–3.960)

## 2019-05-13 MED ORDER — SODIUM CHLORIDE 0.9% FLUSH
10.0000 mL | INTRAVENOUS | Status: DC | PRN
  Administered 2019-05-13: 10 mL
  Filled 2019-05-13: qty 10

## 2019-05-13 MED ORDER — SODIUM CHLORIDE 0.9 % IV SOLN
Freq: Once | INTRAVENOUS | Status: AC
  Administered 2019-05-13: 10:00:00 via INTRAVENOUS
  Filled 2019-05-13: qty 250

## 2019-05-13 MED ORDER — SODIUM CHLORIDE 0.9 % IV SOLN
40.0000 meq | Freq: Once | INTRAVENOUS | Status: AC
  Administered 2019-05-13: 11:00:00 40 meq via INTRAVENOUS
  Filled 2019-05-13: qty 20

## 2019-05-13 MED ORDER — HEPARIN SOD (PORK) LOCK FLUSH 100 UNIT/ML IV SOLN
500.0000 [IU] | Freq: Once | INTRAVENOUS | Status: AC | PRN
  Administered 2019-05-13: 16:00:00 500 [IU]
  Filled 2019-05-13: qty 5

## 2019-05-13 MED ORDER — SODIUM CHLORIDE 0.9 % IV SOLN
200.0000 mg | Freq: Once | INTRAVENOUS | Status: AC
  Administered 2019-05-13: 15:00:00 200 mg via INTRAVENOUS
  Filled 2019-05-13: qty 8

## 2019-05-13 NOTE — Patient Outreach (Signed)
[facility]) Care Management  05/13/2019  NAWAAL ALLING Dec 16, 1950 [id number]   Subjective: Telephone call to patient's home  / mobile number, no answer, left HIPAA compliant voicemail message, and requested call back.    Objective:Per KPN (Knowledge Performance Now, point of care tool) and chart review,patient hospitalized 05/04/2019 - 05/07/2019 for nausea vomiting,  hospitalized 04/15/2019 -04/19/2019 forLarge cell carcinoma of left lung, stage 4,Neutropenia,Pancytopenia. Patient also has a history of metastatic high-grade neuroendocrine carcinoma, hyperlipidemia, nonocclusive thrombus under the Port-A-Cath,Chronic kidney disease,kidney stones,radiation therapy,Pyelonephritisand UTI.      Assessment: Received Medicare EMMI General Discharge Red Alert Flag follow up referral on 05/10/2019. Red Flag Alert triggers times 2, Day #1, patient answered no to the following questions:Transportation to follow-up?  Taking meds?  [facility] EMMI follow up pending patient contact.      Plan:RNCM has sent unsuccessful outreach letter, [facility] pamphlet, handout: Know Before You Go, will call patient for 3rd telephone outreach attempt within 4 business days, [facility] EMMI follow up, and proceed with case closure, within 10 business days if no return call.     Kairyn Olmeda H. Annia Friendly, BSN, [place] [facility] Telephonic CM Phone: [phone] Fax: [phone]

## 2019-05-13 NOTE — Patient Instructions (Addendum)
[facility] Discharge Instructions for Patients Receiving Chemotherapy  Today you received the following chemotherapy agents Keytruda  To help prevent nausea and vomiting after your treatment, we encourage you to take your nausea medication as directed.   If you develop nausea and vomiting that is not controlled by your nausea medication, call the clinic.   BELOW ARE SYMPTOMS THAT SHOULD BE REPORTED IMMEDIATELY:  *FEVER GREATER THAN 100.5 F  *CHILLS WITH OR WITHOUT FEVER  NAUSEA AND VOMITING THAT IS NOT CONTROLLED WITH YOUR NAUSEA MEDICATION  *UNUSUAL SHORTNESS OF BREATH  *UNUSUAL BRUISING OR BLEEDING  TENDERNESS IN MOUTH AND THROAT WITH OR WITHOUT PRESENCE OF ULCERS  *URINARY PROBLEMS  *BOWEL PROBLEMS  UNUSUAL RASH Items with * indicate a potential emergency and should be followed up as soon as possible.  Feel free to call the clinic should you have any questions or concerns. The clinic phone number is [phone].  Please show the [facility] at check-in to the Emergency Department and triage nurse.   Potassium Content of Foods  Potassium is a mineral found in many foods and drinks. It affects how the heart works, and helps keep fluids and minerals balanced in the body. The amount of potassium you need each day depends on your age and any medical conditions you may have. Talk to your health care provider or dietitian about how much potassium you need. The following lists of foods provide the general serving size for foods and the approximate amount of potassium in each serving, listed in milligrams (mg). Actual values may vary depending on the product and how it is processed. High in potassium The following foods and beverages have 200 mg or more of potassium per serving:  Apricots (raw) - 2 have 200 mg of potassium.  Apricots (dry) - 5 have 200 mg of potassium.  Artichoke - 1 medium has 345 mg of potassium.  Avocado -  fruit has 245 mg of  potassium.  Banana - 1 medium fruit has 425 mg of potassium.  Ganado or baked beans (canned) -  cup has 280 mg of potassium.  White beans (canned) -  cup has 595 mg potassium.  Beef roast - 3 oz has 320 mg of potassium.  Ground beef - 3 oz has 270 mg of potassium.  Beets (raw or cooked) -  cup has 260 mg of potassium.  Bran muffin - 2 oz has 300 mg of potassium.  Broccoli (cooked) -  cup has 230 mg of potassium.  Brussels sprouts -  cup has 250 mg of potassium.  Cantaloupe -  cup has 215 mg of potassium.  Cereal, 100% bran -  cup has 200-400 mg of potassium.  Cheeseburger -1 single fast food burger has 225-400 mg of potassium.  Chicken - 3 oz has 220 mg of potassium.  Clams (canned) - 3 oz has 535 mg of potassium.  Crab - 3 oz has 225 mg of potassium.  Dates - 5 have 270 mg of potassium.  Dried beans and peas -  cup has 300-475 mg of potassium.  Figs (dried) - 2 have 260 mg of potassium.  Fish (halibut, tuna, cod, snapper) - 3 oz has 480 mg of potassium.  Fish (salmon, haddock, swordfish, perch) - 3 oz has 300 mg of potassium.  Fish (tuna, canned) - 3 oz has 200 mg of potassium.  [place] fries (fast food) - 3 oz has 470 mg of potassium.  Granola with fruit and nuts -  cup has 200 mg of potassium.  Grapefruit juice -  cup has 200 mg of potassium.  Honeydew melon -  cup has 200 mg of potassium.  Kale (raw) - 1 cup has 300 mg of potassium.  Kiwi - 1 medium fruit has 240 mg of potassium.  Kohlrabi, rutabaga, parsnips -  cup has 280 mg of potassium.  Lentils -  cup has 365 mg of potassium.  Mango - 1 each has 325 mg of potassium.  Milk (nonfat, low-fat, whole, buttermilk) - 1 cup has 350-380 mg of potassium.  Milk (chocolate) - 1 cup has 420 mg of potassium  Molasses - 1 Tbsp has 295 mg of potassium.  Mushrooms -  cup has 280 mg of potassium.  Nectarine - 1 each has 275 mg of potassium.  Nuts (almonds, peanuts, hazelnuts, [place], cashew,  mixed) - 1 oz has 200 mg of potassium.  Nuts (pistachios) - 1 oz has 295 mg of potassium.  Orange - 1 fruit has 240 mg of potassium.  Orange juice -  cup has 235 mg of potassium.  Papaya -  medium fruit has 390 mg of potassium.  Peanut butter (chunky) - 2 Tbsp has 240 mg of potassium.  Peanut butter (smooth) - 2 Tbsp has 210 mg of potassium.  Pear - 1 medium (200 mg) of potassium.  Pomegranate - 1 whole fruit has 400 mg of potassium.  Pomegranate juice -  cup has 215 mg of potassium.  Pork - 3 oz has 350 mg of potassium.  Potato chips (salted) - 1 oz has 465 mg of potassium.  Potato (baked with skin) - 1 medium has 925 mg of potassium.  Potato (boiled) -  cup has 255 mg of potassium.  Potato (Mashed) -  cup has 330 mg of potassium.  Prune juice -  cup has 370 mg of potassium.  Prunes - 5 have 305 mg of potassium.  Pudding (chocolate) -  cup has 230 mg of potassium.  Pumpkin (canned) -  cup has 250 mg of potassium.  Raisins (seedless) -  cup has 270 mg of potassium.  Seeds (sunflower or pumpkin) - 1 oz has 240 mg of potassium.  Soy milk - 1 cup has 300 mg of potassium.  Spinach (cooked) - 1/2 cup has 420 mg of potassium.  Spinach (canned) -  cup has 370 mg of potassium.  Sweet potato (baked with skin) - 1 medium has 450 mg of potassium.  Swiss chard -  cup has 480 mg of potassium.  Tomato or vegetable juice -  cup has 275 mg of potassium.  Tomato (sauce or puree) -  cup has 400-550 mg of potassium.  Tomato (raw) - 1 medium has 290 mg of potassium.  Tomato (canned) -  cup has 200-300 mg of potassium.  [place] - 3 oz has 250 mg of potassium.  Wheat germ - 1 oz has 250 mg of potassium.  Winter squash -  cup has 250 mg of potassium.  Yogurt (plain or fruited) - 6 oz has 260-435 mg of potassium.  Zucchini -  cup has 220 mg of potassium. Moderate in potassium The following foods and beverages have 50-200 mg of potassium per  serving:  Apple - 1 fruit has 150 mg of potassium  Apple juice -  cup has 150 mg of potassium  Applesauce -  cup has 90 mg of potassium  Apricot nectar -  cup has 140 mg of potassium  Asparagus (small spears) -  cup has 155 mg of potassium  Asparagus (large spears) - 6 have 155 mg of potassium  Bagel (cinnamon raisin) - 1 four-inch bagel has 130 mg of potassium  Bagel (egg or plain) - 1 four- inch bagel has 70 mg of potassium  Beans (green) -  cup has 90 mg of potassium  Beans (yellow) -  cup has 190 mg of potassium  Beer, regular - 12 oz has 100 mg of potassium  Beets (canned) -  cup has 125 mg of potassium  Blackberries -  cup has 115 mg of potassium  Blueberries -  cup has 60 mg of potassium  Bread (whole wheat) - 1 slice has 70 mg of potassium  Broccoli (raw) -  cup has 145 mg of potassium  Cabbage -  cup has 150 mg of potassium  Carrots (cooked or raw) -  cup has 180 mg of potassium  Cauliflower (raw) -  cup has 150 mg of potassium  Celery (raw) -  cup has 155 mg of potassium  Cereal, bran flakes -  cup has 120-150 mg of potassium  Cheese (cottage) -  cup has 110 mg of potassium  Cherries - 10 have 150 mg of potassium  Chocolate - 1 oz bar has 165 mg of potassium  Coffee (brewed) - 6 oz has 90 mg of potassium  Corn -  cup or 1 ear has 195 mg of potassium  Cucumbers -  cup has 80 mg of potassium  Egg - 1 large egg has 60 mg of potassium  Eggplant -  cup has 60 mg of potassium  Endive (raw) -  cup has 80 mg of potassium  English muffin - 1 has 65 mg of potassium  Fish (ocean perch) - 3 oz has 192 mg of potassium  Frankfurter, beef or pork - 1 has 75 mg of potassium  Fruit cocktail -  cup has 115 mg of potassium  Grape juice -  cup has 170 mg of potassium  Grapefruit -  fruit has 175 mg of potassium  Grapes -  cup has 155 mg of potassium  Greens: kale, turnip, collard -  cup has 110-150 mg of potassium  Ice  cream or frozen yogurt (chocolate) -  cup has 175 mg of potassium  Ice cream or frozen yogurt (vanilla) -  cup has 120-150 mg of potassium  Lemons, limes - 1 each has 80 mg of potassium  Lettuce - 1 cup has 100 mg of potassium  Mixed vegetables -  cup has 150 mg of potassium  Mushrooms, raw -  cup has 110 mg of potassium  Nuts (walnuts, pecans, or macadamia) - 1 oz has 125 mg of potassium  Oatmeal -  cup has 80 mg of potassium  Okra -  cup has 110 mg of potassium  Onions -  cup has 120 mg of potassium  Peach - 1 has 185 mg of potassium  Peaches (canned) -  cup has 120 mg of potassium  Pears (canned) -  cup has 120 mg of potassium  Peas, green (frozen) -  cup has 90 mg of potassium  Peppers (Green) -  cup has 130 mg of potassium  Peppers (Red) -  cup has 160 mg of potassium  Pineapple juice -  cup has 165 mg of potassium  Pineapple (fresh or canned) -  cup has 100 mg of potassium  Plums - 1 has 105 mg of potassium  Pudding, vanilla -  cup has 150 mg  of potassium  Raspberries -  cup has 90 mg of potassium  Rhubarb -  cup has 115 mg of potassium  Rice, wild -  cup has 80 mg of potassium  Shrimp - 3 oz has 155 mg of potassium  Spinach (raw) - 1 cup has 170 mg of potassium  Strawberries -  cup has 125 mg of potassium  Summer squash -  cup has 175-200 mg of potassium  Swiss chard (raw) - 1 cup has 135 mg of potassium  Tangerines - 1 fruit has 140 mg of potassium  Tea, brewed - 6 oz has 65 mg of potassium  Turnips -  cup has 140 mg of potassium  Watermelon -  cup has 85 mg of potassium  Wine (Red, table) - 5 oz has 180 mg of potassium  Wine (White, table) - 5 oz 100 mg of potassium Low in potassium The following foods and beverages have less than 50 mg of potassium per serving.  Bread (white) - 1 slice has 30 mg of potassium  Carbonated beverages - 12 oz has less than 5 mg of potassium  Cheese - 1 oz has 20-30 mg of  potassium  Cranberries -  cup has 45 mg of potassium  Cranberry juice cocktail -  cup has 20 mg of potassium  Fats and oils - 1 Tbsp has less than 5 mg of potassium  Hummus - 1 Tbsp has 32 mg of potassium  Nectar (papaya, mango, or pear) -  cup has 35 mg of potassium  Rice (white or brown) -  cup has 50 mg of potassium  Spaghetti or macaroni (cooked) -  cup has 30 mg of potassium  Tortilla, flour or corn - 1 has 50 mg of potassium  Waffle - 1 four-inch waffle has 50 mg of potassium  Water chestnuts -  cup has 40 mg of potassium Summary  Potassium is a mineral found in many foods and drinks. It affects how the heart works, and helps keep fluids and minerals balanced in the body.  The amount of potassium you need each day depends on your age and any existing medical conditions you may have. Your health care provider or dietitian may recommend an amount of potassium that you should have each day. This information is not intended to replace advice given to you by your health care provider. Make sure you discuss any questions you have with your health care provider. Document Released: 06/14/2005 Document Revised: 10/13/2017 Document Reviewed: 01/25/2017 Elsevier Patient Education  [place].   Hypomagnesemia Hypomagnesemia is a condition in which the level of magnesium in the blood is low. Magnesium is a mineral that is found in many foods. It is used in many different processes in the body. Hypomagnesemia can affect every organ in the body. In severe cases, it can cause life-threatening problems. What are the causes? This condition may be caused by:  Not getting enough magnesium in your diet.  Malnutrition.  Problems with absorbing magnesium from the intestines.  Dehydration.  Alcohol abuse.  Vomiting.  Severe or chronic diarrhea.  Some medicines, including medicines that make you urinate more (diuretics).  Certain diseases, such as kidney disease,  diabetes, celiac disease, and overactive thyroid. What are the signs or symptoms? Symptoms of this condition include:  Loss of appetite.  Nausea and vomiting.  Involuntary shaking or trembling of a body part (tremor).  Muscle weakness.  Tingling in the arms and legs.  Sudden tightening of muscles (muscle spasms).  Confusion.  Psychiatric issues, such as depression, irritability, or psychosis.  A feeling of fluttering of the heart.  Seizures. These symptoms are more severe if magnesium levels drop suddenly. How is this diagnosed? This condition may be diagnosed based on:  Your symptoms and medical history.  A physical exam.  Blood and urine tests. How is this treated? Treatment depends on the cause and the severity of the condition. It may be treated with:  A magnesium supplement. This can be taken in pill form. If the condition is severe, magnesium is usually given through an IV.  Changes to your diet. You may be directed to eat foods that have a lot of magnesium, such as green leafy vegetables, peas, beans, and nuts.  Stopping any intake of alcohol. Follow these instructions at home:      Make sure that your diet includes foods with magnesium. Foods that have a lot of magnesium in them include: ? Green leafy vegetables, such as spinach and broccoli. ? Beans and peas. ? Nuts and seeds, such as almonds and sunflower seeds. ? Whole grains, such as whole grain bread and fortified cereals.  Take magnesium supplements if your health care provider tells you to do that. Take them as directed.  Take over-the-counter and prescription medicines only as told by your health care provider.  Have your magnesium levels monitored as told by your health care provider.  When you are active, drink fluids that contain electrolytes.  Avoid drinking alcohol.  Keep all follow-up visits as told by your health care provider. This is important. Contact a health care provider  if:  You get worse instead of better.  Your symptoms return. Get help right away if you:  Develop severe muscle weakness.  Have trouble breathing.  Feel that your heart is racing. Summary  Hypomagnesemia is a condition in which the level of magnesium in the blood is low.  Hypomagnesemia can affect every organ in the body.  Treatment may include eating more foods that contain magnesium, taking magnesium supplements, and not drinking alcohol.  Have your magnesium levels monitored as told by your health care provider. This information is not intended to replace advice given to you by your health care provider. Make sure you discuss any questions you have with your health care provider. Document Released: 07/27/2005 Document Revised: 10/13/2017 Document Reviewed: 10/02/2017 Elsevier Patient Education  2020 [employer].

## 2019-05-13 NOTE — Progress Notes (Signed)
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  She presented with left upper lobe lung nodule in addition to mediastinal lymphadenopathy as well as right upper lobe as well as metastatic bone disease to the sixth and 10th rib.  The patient has significant pain from her metastatic rib lesions.  Molecular studies by [employer]ant 360 showed no actionable mutations.  PRIOR THERAPY:  1) systemic chemotherapy with carboplatin for AUC of 5 on day 1, etoposide 100 mg/M2 on days 1, 2 and 3 as well as Tecentriq 1200 mg IV every 3 weeks with Neulasta support.  Status post 2 cycles, discontinued secondary to disease progression. 2) palliative radiotherapy to progressive T10 vertebral body bone lesion under the care of Dr. Isidore Moos .  CURRENT THERAPY: Systemic chemotherapy with carboplatin for AUC of 5, Alimta 500 mg/M2 and Keytruda 200 mg IV every 3 weeks.  First dose December 17, 2018.  Status post 7 cycles.  Starting cycle #5 the patient is currently on maintenance treatment with Alimta and Keytruda.  INTERVAL HISTORY: Alison Sullivan 68 y.o. female returns to the clinic today for follow-up visit.  The patient continues to complain of fatigue as well as left mid back pain.  She will start palliative radiotherapy to the left dense lesion today.  She denied having any chest pain, shortness of breath except with exertion with no cough or hemoptysis.  She denied having any fever or chills.  She was admitted to the hospital recently with nausea and vomiting.  She was also found to have hypokalemia and received IV fluids with potassium supplements.  She is unable to take the oral potassium supplements.  She denied having any headache or visual changes.  She is  here today for evaluation before starting cycle #8 of her treatment.   MEDICAL HISTORY: Past Medical History:  Diagnosis Date  . Arthritis   . Chronic kidney disease   . Dog bite of wrist   . History of kidney stones   . History of radiation therapy 10/23/18- 11/06/18   Left lower rib mass/ 30 Gy delivered in 10 fractions 3 Gy.   . Kidney infection   . Left renal mass   . Lung cancer ([place])   . Metastatic bone cancer ([place])   . Osteopenia   . Pyelonephritis   . Recurrent UTI (urinary tract infection)     ALLERGIES:  is allergic to barium sulfate.  MEDICATIONS:  Current Outpatient Medications  Medication Sig Dispense Refill  . ALPRAZolam (XANAX) 0.25 MG tablet Take 1 tablet (0.25 mg total) by mouth 2 (two) times daily as needed for anxiety. 30 tablet 0  . bisacodyl (BISACODYL) 5 MG EC tablet Take 5 mg by mouth daily as needed for moderate constipation.    . folic acid (FOLVITE) 1 MG tablet Take 1 tablet (1 mg total) by mouth daily. (Patient taking differently: Take 1 mg by mouth every evening. ) 30 tablet 4  . gabapentin (NEURONTIN) 300 MG capsule Take 300 mg by mouth daily as needed (pain).    . Hydrocortisone (GERHARDT'S BUTT CREAM) CREA Apply 1 application topically daily. 1 each 0  . hydrOXYzine (ATARAX/VISTARIL) 25 MG tablet Take 1 tablet (25 mg total) by mouth 3 (three) times daily as needed for nausea (  use Zofran 1st). 30 tablet 0  . lidocaine-prilocaine (EMLA) cream Apply 1 application topically as needed. (Patient taking differently: Apply 1 application topically as needed (For port-a-cath.). ) 30 g 0  . magic mouthwash SOLN Take 5 mLs by mouth 4 (four) times daily as needed for mouth pain. Components - Hydrocortisone 60 mg, Nystatin suspension 30 ml , Benadryl 12.5mg  /5 ml QS 240 ml. 240 mL 1  . magnesium oxide (MAG-OX) 400 (241.3 Mg) MG tablet Take 1 tablet (400 mg total) by mouth 2 (two) times daily. 60 tablet 0  . Melatonin 10 MG TABS Take 10 mg by mouth at bedtime.      . Multiple Vitamin (MULTIVITAMIN WITH MINERALS) TABS tablet Take 1 tablet by mouth daily. 30 tablet 0  . omeprazole (PRILOSEC) 40 MG capsule Take 1 capsule (40 mg total) by mouth daily. 30 capsule 2  . oxyCODONE (OXY IR/ROXICODONE) 5 MG immediate release tablet Take 1-2 tablets (5-10 mg total) by mouth every 4 (four) hours as needed for severe pain. 60 tablet 0  . potassium chloride (K-DUR) 10 MEQ tablet Take 1 tablet (10 mEq total) by mouth 2 (two) times daily. 20 tablet 0  . prochlorperazine (COMPAZINE) 10 MG tablet Take 1 tablet (10 mg total) by mouth every 6 (six) hours as needed for nausea or vomiting. 30 tablet 0  . prochlorperazine (COMPAZINE) 10 MG tablet     . warfarin (COUMADIN) 5 MG tablet TAKE 1 TABLET(5 MG) BY MOUTH DAILY 90 tablet 1  . OxyCODONE HCl, Abuse Deter, (OXAYDO) 5 MG TABA      No current facility-administered medications for this visit.     SURGICAL HISTORY:  Past Surgical History:  Procedure Laterality Date  . CYSTOSCOPY WITH RETROGRADE PYELOGRAM, URETEROSCOPY AND STENT PLACEMENT Left 12/28/2017   Procedure: CYSTOSCOPY WITH RETROGRADE PYELOGRAM, URETEROSCOPY AND STENT PLACEMENT, STONE BASKETRY;  Surgeon: Festus Aloe, MD;  Location: [facility];  Service: Urology;  Laterality: Left;  . FINGER SURGERY     index, growth removal  . HOLMIUM LASER APPLICATION Left 07/28/7828   Procedure: HOLMIUM LASER APPLICATION;  Surgeon: Festus Aloe, MD;  Location[facility];  Service: Urology;  Laterality: Left;  . IR IMAGING GUIDED PORT INSERTION  10/31/2018  . KIDNEY STONE SURGERY  2011 or 2012  . LITHOTRIPSY  2007    REVIEW OF SYSTEMS:  Constitutional: positive for fatigue Eyes: negative Ears, nose, mouth, throat, and face: negative Respiratory: positive for dyspnea on exertion Cardiovascular: negative Gastrointestinal: negative Genitourinary:negative Integument/breast: negative Hematologic/lymphatic: negative  Musculoskeletal:negative Neurological: negative Behavioral/Psych: negative Endocrine: negative Allergic/Immunologic: negative   PHYSICAL EXAMINATION: General appearance: alert, cooperative, fatigued and no distress Head: Normocephalic, without obvious abnormality, atraumatic Neck: no adenopathy, no JVD, supple, symmetrical, trachea midline and thyroid not enlarged, symmetric, no tenderness/mass/nodules Lymph nodes: Cervical, supraclavicular, and axillary nodes normal. Resp: clear to auscultation bilaterally Back: symmetric, no curvature. ROM normal. No CVA tenderness. Cardio: regular rate and rhythm, S1, S2 normal, no murmur, click, rub or gallop GI: soft, non-tender; bowel sounds normal; no masses,  no organomegaly Extremities: extremities normal, atraumatic, no cyanosis or edema Neurologic: Alert and oriented X 3, normal strength and tone. Normal symmetric reflexes. Normal coordination and gait  ECOG PERFORMANCE STATUS: 1 - Symptomatic but completely ambulatory  Blood pressure 106/78, pulse (!) 104, temperature 98.5 F (36.9 C), temperature source Oral, resp. rate 18, height 5\' 6"  (1.676 m), weight 133 lb 14.4 oz (60.7 kg), SpO2 98 %.  LABORATORY DATA: Lab Results  Component Value Date   WBC 6.9 05/13/2019   HGB 10.9 (L) 05/13/2019   HCT 33.4 (L) 05/13/2019   MCV 100.9 (H) 05/13/2019   PLT 212 05/13/2019      Chemistry      Component Value Date/Time   NA 138 05/07/2019 0535   K 3.0 (L) 05/07/2019 0535   CL 108 05/07/2019 0535   CO2 22 05/07/2019 0535   BUN 5 (L) 05/07/2019 0535   CREATININE 0.71 05/07/2019 0535   CREATININE 0.91 04/22/2019 0834      Component Value Date/Time   CALCIUM 7.9 (L) 05/07/2019 0535   ALKPHOS 88 05/07/2019 0535   AST 33 05/07/2019 0535   AST 61 (H) 04/22/2019 0834   ALT 17 05/07/2019 0535   ALT 33 04/22/2019 0834   BILITOT 0.4 05/07/2019 0535   BILITOT 0.6 04/22/2019 0834       RADIOGRAPHIC STUDIES: Dg Ribs Unilateral W/chest Left   Result Date: 04/24/2019 CLINICAL DATA:  Known rib metastasis from lung cancer. EXAM: LEFT RIBS AND CHEST - 3+ VIEW COMPARISON:  CT chest 04/11/2019 FINDINGS: LEFT tenth rib metastasis better seen on CT, permeative changes, likely pathologic fracture. LEFT pleural effusion may be mildly increased. Sclerosis RIGHT T10 vertebral body, likely tumor. Port-A-Cath. BILATERAL lung pulmonary nodules probably unchanged when correlated with prior CT. IMPRESSION: LEFT tenth rib metastasis better seen on CT, likely pathologic fracture. LEFT pleural effusion may be mildly increased. Electronically Signed   By: Staci Righter M.D.   On: 04/24/2019 11:30    ASSESSMENT AND PLAN: This is a very pleasant 68 years old white female recently diagnosed with high-grade neuroendocrine carcinoma.  Molecular study shows no actionable mutations by [employer] 360. The patient underwent systemic chemotherapy with small cell regimen including carboplatin, etoposide and Tecentriq status post 2 cycles. She tolerated her treatment well but unfortunately repeat CT scan of the chest, abdomen and pelvis showed evidence for disease progression. The patient was started on systemic chemotherapy with carboplatin, Alimta and Keytruda status post 7 cycles.   She is tolerating this treatment well except for fatigue and occasional nausea and vomiting. I recommended for her to proceed with cycle #8 today as planned. For the hypokalemia will arrange for the patient to receive intravenous potassium chloride today. For the enlarging lesion in the left 10th rib, the patient is currently undergoing palliative radiotherapy under the care of Dr. Isidore Moos. For the malnutrition, I will refer the patient to the dietitian for evaluation. For the suspicious nonocclusive blood clot at the tip of the Port-A-Cath, she was started on Coumadin 5 mg p.o. daily.  We will continue to monitor her PT/INR closely. For the anxiety and gagging reflex with medication, she is  currently on Xanax 0.25 mg p.o. twice daily as needed. I will see her back for follow-up visit in 3 weeks for evaluation before the next cycle of her treatment. The patient voices understanding of current disease status and treatment options and is in agreement with the current care plan. All questions were answered. The patient knows to call the clinic with any problems, questions or concerns. We can certainly see the patient much sooner if necessary.  Disclaimer: This note was dictated with voice recognition software. Similar sounding words can inadvertently be transcribed and may not be corrected upon review.

## 2019-05-14 ENCOUNTER — Ambulatory Visit
Admission: RE | Admit: 2019-05-14 | Discharge: 2019-05-14 | Disposition: A | Discharge status: Home / Self Care | Payer: Medicare Other | Source: Ambulatory Visit | Attending: Radiation Oncology | Admitting: Radiation Oncology

## 2019-05-14 ENCOUNTER — Other Ambulatory Visit: Payer: Self-pay | Admitting: *Deleted

## 2019-05-14 ENCOUNTER — Other Ambulatory Visit: Payer: Self-pay

## 2019-05-14 DIAGNOSIS — Z923 Personal history of irradiation: Secondary | ICD-10-CM | POA: Diagnosis not present

## 2019-05-14 DIAGNOSIS — C7951 Secondary malignant neoplasm of bone: Secondary | ICD-10-CM | POA: Diagnosis not present

## 2019-05-14 NOTE — Patient Outreach (Signed)
[facility]) Care Management  05/14/2019  Alison Sullivan Oct 08, 1951 [id number]   Subjective:Telephone call to patient's home / mobile number, no answer, left HIPAA compliant voicemail message, and requested call back.    Objective:Per KPN (Knowledge Performance Now, point of care tool) and chart review,patient hospitalized 05/04/2019 - 05/07/2019 for nausea vomiting,hospitalized 04/15/2019 -04/19/2019 forLarge cell carcinoma of left lung, stage 4,Neutropenia,Pancytopenia. Patient also has a history of metastatic high-grade neuroendocrine carcinoma, hyperlipidemia, nonocclusive thrombus under the Port-A-Cath,Chronic kidney disease,kidney stones,radiation therapy,Pyelonephritisand UTI.      Assessment: Received Medicare EMMI General Discharge Red Alert Flag follow up referral on 05/10/2019. Red Flag Alert triggers times 2, Day #1, patient answered no to the following questions:Transportation to follow-up?Taking meds?[facility] EMMI follow up pending patient contact.     Plan:RNCM has sent unsuccessful outreach letter, [facility] pamphlet, handout: Know Before You Go, will  proceed with case closure, within 10 business days if no return call.     Zaelyn Noack H. Annia Friendly, BSN, [place] Management [facility] Telephonic CM Phone: [phone] Fax: [phone]

## 2019-05-15 ENCOUNTER — Other Ambulatory Visit: Payer: Self-pay

## 2019-05-15 ENCOUNTER — Ambulatory Visit
Admission: RE | Admit: 2019-05-15 | Discharge: 2019-05-15 | Disposition: A | Discharge status: Home / Self Care | Payer: Medicare Other | Source: Ambulatory Visit | Attending: Radiation Oncology | Admitting: Radiation Oncology

## 2019-05-15 DIAGNOSIS — C7951 Secondary malignant neoplasm of bone: Secondary | ICD-10-CM | POA: Insufficient documentation

## 2019-05-15 DIAGNOSIS — Z923 Personal history of irradiation: Secondary | ICD-10-CM | POA: Diagnosis not present

## 2019-05-16 ENCOUNTER — Ambulatory Visit
Admission: RE | Admit: 2019-05-16 | Discharge: 2019-05-16 | Disposition: A | Discharge status: Home / Self Care | Payer: Medicare Other | Source: Ambulatory Visit | Attending: Radiation Oncology | Admitting: Radiation Oncology

## 2019-05-16 ENCOUNTER — Other Ambulatory Visit: Payer: Self-pay

## 2019-05-16 DIAGNOSIS — C7951 Secondary malignant neoplasm of bone: Secondary | ICD-10-CM | POA: Diagnosis not present

## 2019-05-16 DIAGNOSIS — Z923 Personal history of irradiation: Secondary | ICD-10-CM | POA: Diagnosis not present

## 2019-05-19 ENCOUNTER — Other Ambulatory Visit: Payer: Self-pay | Admitting: Internal Medicine

## 2019-05-20 ENCOUNTER — Other Ambulatory Visit: Payer: Self-pay | Admitting: Radiation Oncology

## 2019-05-20 ENCOUNTER — Other Ambulatory Visit: Payer: Self-pay

## 2019-05-20 ENCOUNTER — Telehealth: Payer: Self-pay | Admitting: Medical Oncology

## 2019-05-20 ENCOUNTER — Other Ambulatory Visit: Payer: Self-pay | Admitting: Medical

## 2019-05-20 ENCOUNTER — Other Ambulatory Visit: Payer: Self-pay | Admitting: Internal Medicine

## 2019-05-20 ENCOUNTER — Ambulatory Visit
Admission: RE | Admit: 2019-05-20 | Discharge: 2019-05-20 | Disposition: A | Discharge status: Home / Self Care | Payer: Medicare Other | Source: Ambulatory Visit | Attending: Radiation Oncology | Admitting: Radiation Oncology

## 2019-05-20 ENCOUNTER — Telehealth: Payer: Self-pay

## 2019-05-20 DIAGNOSIS — K209 Esophagitis, unspecified without bleeding: Secondary | ICD-10-CM

## 2019-05-20 DIAGNOSIS — C7951 Secondary malignant neoplasm of bone: Secondary | ICD-10-CM

## 2019-05-20 DIAGNOSIS — C3492 Malignant neoplasm of unspecified part of left bronchus or lung: Secondary | ICD-10-CM

## 2019-05-20 DIAGNOSIS — Z923 Personal history of irradiation: Secondary | ICD-10-CM | POA: Diagnosis not present

## 2019-05-20 MED ORDER — GABAPENTIN 300 MG PO CAPS: 300.0000 mg | ORAL_CAPSULE | Freq: Three times a day (TID) | ORAL | 2 refills | Status: AC | PRN

## 2019-05-20 MED ORDER — OXYCODONE-ACETAMINOPHEN 5-325 MG PO TABS
2.0000 | ORAL_TABLET | Freq: Once | ORAL | Status: AC
  Administered 2019-05-20: 2 via ORAL
  Filled 2019-05-20: qty 2

## 2019-05-20 MED ORDER — OXYCODONE HCL 5 MG PO TABS: 5.0000 mg | ORAL_TABLET | ORAL | 0 refills | Status: DC | PRN

## 2019-05-20 NOTE — Telephone Encounter (Signed)
I verify patient info for telephone visit at her clinic visit today for pre reg

## 2019-05-20 NOTE — Telephone Encounter (Signed)
Med was d/c'd 04/19/19. Ok to Rf?

## 2019-05-20 NOTE — Telephone Encounter (Signed)
Son stated she doe snot have a GI . Needs referral.

## 2019-05-20 NOTE — Telephone Encounter (Signed)
Done

## 2019-05-20 NOTE — Telephone Encounter (Signed)
Son Requests GI referral-  cannot swallow pills , vomits after gagging.Pt is not making any progress with swallowing . LVM to return my call.

## 2019-05-21 ENCOUNTER — Other Ambulatory Visit: Payer: Self-pay

## 2019-05-21 ENCOUNTER — Ambulatory Visit: Payer: Medicare Other

## 2019-05-21 ENCOUNTER — Telehealth: Payer: Self-pay | Admitting: Medical Oncology

## 2019-05-21 ENCOUNTER — Inpatient Hospital Stay: Payer: Medicare Other | Attending: Internal Medicine

## 2019-05-21 ENCOUNTER — Telehealth: Payer: Self-pay

## 2019-05-21 ENCOUNTER — Ambulatory Visit
Admission: RE | Admit: 2019-05-21 | Discharge: 2019-05-21 | Disposition: A | Discharge status: Home / Self Care | Payer: Medicare Other | Source: Ambulatory Visit | Attending: Radiation Oncology | Admitting: Radiation Oncology

## 2019-05-21 DIAGNOSIS — R0781 Pleurodynia: Secondary | ICD-10-CM | POA: Insufficient documentation

## 2019-05-21 DIAGNOSIS — Z923 Personal history of irradiation: Secondary | ICD-10-CM | POA: Diagnosis not present

## 2019-05-21 DIAGNOSIS — C7A1 Malignant poorly differentiated neuroendocrine tumors: Secondary | ICD-10-CM | POA: Insufficient documentation

## 2019-05-21 DIAGNOSIS — Z7901 Long term (current) use of anticoagulants: Secondary | ICD-10-CM | POA: Insufficient documentation

## 2019-05-21 DIAGNOSIS — Z87442 Personal history of urinary calculi: Secondary | ICD-10-CM | POA: Insufficient documentation

## 2019-05-21 DIAGNOSIS — E876 Hypokalemia: Secondary | ICD-10-CM | POA: Insufficient documentation

## 2019-05-21 DIAGNOSIS — Z5112 Encounter for antineoplastic immunotherapy: Secondary | ICD-10-CM | POA: Insufficient documentation

## 2019-05-21 DIAGNOSIS — M546 Pain in thoracic spine: Secondary | ICD-10-CM | POA: Insufficient documentation

## 2019-05-21 DIAGNOSIS — C7951 Secondary malignant neoplasm of bone: Secondary | ICD-10-CM | POA: Insufficient documentation

## 2019-05-21 DIAGNOSIS — Z79899 Other long term (current) drug therapy: Secondary | ICD-10-CM | POA: Insufficient documentation

## 2019-05-21 DIAGNOSIS — M1990 Unspecified osteoarthritis, unspecified site: Secondary | ICD-10-CM | POA: Insufficient documentation

## 2019-05-21 NOTE — Telephone Encounter (Signed)
Dr. Julien Nordmann, advise refill.

## 2019-05-21 NOTE — Telephone Encounter (Signed)
Phone screening complete 

## 2019-05-21 NOTE — Telephone Encounter (Signed)
Schedule message sent to notify scheduling that a referral was entered for pt to see [facility].

## 2019-05-21 NOTE — Progress Notes (Signed)
Nutrition Follow-up:  Patient with stage IV lung cancer with metastatic disease to thoracic spine and ribs.  Patient receiving radiation to rib area.  Continues on [place].  Spoke with patient via phone.  Noted hospital admission for nausea and vomiting.  Patient reports that she tried boost breeze in the hospital and really liked it.  Has order more since she has been home.  Also likes the beef broth.  Reports that today she has had 1/2 egg sandwich.  Reports that she is tired and does not feel like fixing anything to eat.  Reports issues with gagging on pills and then vomiting mostly liquids back up.  Reports that she will be seeing GI soon.  Patient reports that liquids go down better.      Medications: reviewed  Labs: reviewed  Anthropometrics:   Weight 133 lb 14.4 oz on 6/29 decreased from 134 lb on 6/8.    NUTRITION DIAGNOSIS: Inadequate oral intake continues   INTERVENTION:  Discussed changing consistencies of foods to more liquid consistency for ease of swallowing.   Encouraged boost breeze 2-3 times per day Contact information provided    MONITORING, EVALUATION, GOAL: Patient will consume adequate calories and protein to prevent further weight loss   NEXT VISIT: phone f/u July 28  Lennis Korb B. Zenia Resides, [place], [place] Registered Dietitian [phone] (pager)

## 2019-05-22 ENCOUNTER — Ambulatory Visit (INDEPENDENT_AMBULATORY_CARE_PROVIDER_SITE_OTHER): Payer: Medicare Other | Admitting: Internal Medicine

## 2019-05-22 ENCOUNTER — Encounter: Payer: Self-pay | Admitting: Internal Medicine

## 2019-05-22 ENCOUNTER — Ambulatory Visit
Admission: RE | Admit: 2019-05-22 | Discharge: 2019-05-22 | Disposition: A | Discharge status: Home / Self Care | Payer: Medicare Other | Source: Ambulatory Visit | Attending: Radiation Oncology | Admitting: Radiation Oncology

## 2019-05-22 ENCOUNTER — Other Ambulatory Visit: Payer: Self-pay | Admitting: Medical Oncology

## 2019-05-22 ENCOUNTER — Other Ambulatory Visit: Payer: Self-pay | Admitting: Internal Medicine

## 2019-05-22 VITALS — Ht 66.0 in | Wt 130.0 lb

## 2019-05-22 DIAGNOSIS — Z923 Personal history of irradiation: Secondary | ICD-10-CM | POA: Diagnosis not present

## 2019-05-22 DIAGNOSIS — E876 Hypokalemia: Secondary | ICD-10-CM | POA: Diagnosis not present

## 2019-05-22 DIAGNOSIS — R9389 Abnormal findings on diagnostic imaging of other specified body structures: Secondary | ICD-10-CM | POA: Diagnosis not present

## 2019-05-22 DIAGNOSIS — C7B8 Other secondary neuroendocrine tumors: Secondary | ICD-10-CM | POA: Diagnosis not present

## 2019-05-22 DIAGNOSIS — R112 Nausea with vomiting, unspecified: Secondary | ICD-10-CM

## 2019-05-22 DIAGNOSIS — C7951 Secondary malignant neoplasm of bone: Secondary | ICD-10-CM | POA: Diagnosis not present

## 2019-05-22 MED ORDER — ALPRAZOLAM 0.25 MG PO TABS: 0.2500 mg | ORAL_TABLET | Freq: Two times a day (BID) | ORAL | 0 refills | Status: DC | PRN

## 2019-05-22 NOTE — Telephone Encounter (Signed)
Refill reqeust for xanax

## 2019-05-22 NOTE — Progress Notes (Signed)
HISTORY OF PRESENT ILLNESS:  Alison Sullivan is a 68 y.o. female with stage IV high-grade neuroendocrine carcinoma diagnosed November 2019.  She is sent today by her oncologist Dr. Earlie Server for evaluation of nausea and vomiting.  Patient schedules this WebEx telehealth audiovisual visit during the coronavirus pandemic.  She was found to have a left upper lung lesion with mediastinal adenopathy as well as disease in the right lung and metastatic disease to the ribs.  She has failed 1 course of chemotherapy and is currently on a second course of systemic chemotherapy.  As well palliative radiation.  Current complaint is that of 4 to 6 weeks of aversion to certain pills.  She states that when she takes her medications with a particular texture or taste this elicits a gag reflex with subsequent vomiting.  As well as she has coughing spells they may ultimately end up with vomiting.  She does take some medications without difficulty.  It seems that she is able to eat quite often without issues.  In particular no dysphasia of the esophagus on careful questioning.  She was recently placed on Xanax which she takes in the morning.  This seems to help.  She is on chronic Coumadin therapy due to possible partial clotting of her Port-A-Cath.  She is also on omeprazole without burning or painful swallowing.  Recurrent radiation therapy field seems to be out of the range of the esophagus.  Multiple x-rays have been reviewed including most recent CT scan of the abdomen and pelvis Apr 11, 2019.  Multiple abnormalities including left periaortic and left pericardial dominant tumors.  Blood work from May 13, 2019 shows potassium 2.5.  Hemoglobin 10.9.  She is on potassium supplements which she finds large and difficult.  I asked about liquid form and she states that this is unpalatable.  REVIEW OF SYSTEMS:  All non-GI ROS negative unless otherwise stated in the HPI except for arthritis  Past Medical History:  Diagnosis Date   . Arthritis   . Chronic kidney disease   . Dog bite of wrist   . History of kidney stones   . History of radiation therapy 10/23/18- 11/06/18   Left lower rib mass/ 30 Gy delivered in 10 fractions 3 Gy.   . Kidney infection   . Left renal mass   . Lung cancer ([place])   . Metastatic bone cancer ([place])   . Osteopenia   . Pyelonephritis   . Recurrent UTI (urinary tract infection)     Past Surgical History:  Procedure Laterality Date  . CYSTOSCOPY WITH RETROGRADE PYELOGRAM, URETEROSCOPY AND STENT PLACEMENT Left 12/28/2017   Procedure: CYSTOSCOPY WITH RETROGRADE PYELOGRAM, URETEROSCOPY AND STENT PLACEMENT, STONE BASKETRY;  Surgeon: Festus Aloe, MD;  Location: [facility];  Service: Urology;  Laterality: Left;  . FINGER SURGERY     index, growth removal  . HOLMIUM LASER APPLICATION Left 0/63/0160   Procedure: HOLMIUM LASER APPLICATION;  Surgeon: Festus Aloe, MD;  Location: [facility];  Service: Urology;  Laterality: Left;  . IR IMAGING GUIDED PORT INSERTION  10/31/2018  . KIDNEY STONE SURGERY  2011 or 2012  . LITHOTRIPSY  2007    Social History ABRAHAM MARGULIES  reports that she has quit smoking. Her smoking use included cigarettes. She has a 7.50 pack-year smoking history. She has never used smokeless tobacco. She reports current alcohol use. She reports that she does not use drugs.  family history includes Cancer in her father; Melanoma in her father.  Allergies  Allergen Reactions  . Barium Sulfate Diarrhea    Hypersensitivity intolerance reaction of stomach cramps and diarrhea.       PHYSICAL EXAMINATION: No physical exam with telehealth medicine visit   ASSESSMENT:  1.  Nausea with occasional vomiting.  This is almost certainly functional based on history 2.  Pill aversion 3.  Metastatic neuroendocrine tumor   PLAN:  1.  Discussed ways to disguise pills in entities such as applesauce, yogurt, or ice cream.  Patient tells  me that "these strategies do not work for me" 2.  Discussed barium esophagram to rule out any esophageal pathology, though I think this is unlikely.  Patient tells me that "I will vomit that contrast material and gives me diarrhea".  Thus, we decided not to proceed with esophagram 3.  Recommended increasing her Xanax from once daily to 2 or 3 times daily.  She should discuss this with Dr. Julien Nordmann first 4.  PRESCRIBE Zofran 4 mg; #90; 3 refills take 1 3 times daily as needed for nausea 5.  Resume oncologic care with Dr. Earlie Server.  GI follow-up as needed.  Could consider diagnostic upper endoscopy (low yield) if all else fails in hopes of finding something useful regarding her symptom complex This WebEx A/V telehealth medicine visit was initiated by and consented for by the patient who was in her home while I was in my office during the encounter.  She understands her may be associated professional charge for this service which totaled 45 minutes in duration

## 2019-05-23 ENCOUNTER — Ambulatory Visit: Payer: Medicare Other

## 2019-05-23 ENCOUNTER — Other Ambulatory Visit: Payer: Self-pay | Admitting: *Deleted

## 2019-05-23 ENCOUNTER — Other Ambulatory Visit: Payer: Self-pay

## 2019-05-23 ENCOUNTER — Ambulatory Visit
Admission: RE | Admit: 2019-05-23 | Discharge: 2019-05-23 | Disposition: A | Discharge status: Home / Self Care | Payer: Medicare Other | Source: Ambulatory Visit | Attending: Radiation Oncology | Admitting: Radiation Oncology

## 2019-05-23 ENCOUNTER — Telehealth: Payer: Self-pay | Admitting: Medical Oncology

## 2019-05-23 DIAGNOSIS — C7951 Secondary malignant neoplasm of bone: Secondary | ICD-10-CM | POA: Diagnosis not present

## 2019-05-23 DIAGNOSIS — Z923 Personal history of irradiation: Secondary | ICD-10-CM | POA: Diagnosis not present

## 2019-05-23 MED ORDER — ONDANSETRON HCL 4 MG PO TABS: 4.0000 mg | ORAL_TABLET | Freq: Three times a day (TID) | ORAL | 3 refills | Status: AC | PRN

## 2019-05-23 NOTE — Addendum Note (Signed)
Addended by: Audrea Muscat on: 05/23/2019 09:16 AM   Modules accepted: Orders

## 2019-05-23 NOTE — Telephone Encounter (Signed)
Nausea management- Pt saw Dr Henrene Pastor who recommended to start zofran as prescribed.  She said Dr Henrene Pastor also told her the xanax has some antiemetic potential. I told pt that [facility] did not want to increase her xanax to 3 times/day prn  . She can take it 1-2 times a day . PT voiced understanding and was told to call if this does not help.

## 2019-05-23 NOTE — Patient Instructions (Signed)
  1.  Discussed ways to disguise pills in entities such as applesauce, yogurt, or ice cream.   2.  Discussed barium esophagram to rule out any esophageal pathology, though I think this is unlikely.  At this time we decided not to proceed with this  3.  Recommended increasing her Xanax from once daily to 2 or 3 times daily.  She should discuss this with Dr. Julien Nordmann first  4.  PRESCRIBE Zofran 4 mg; #90; 3 refills take 1 3 times daily as needed for nausea  5.  Resume oncologic care with Dr. Earlie Server.  GI follow-up as needed.  Could consider diagnostic upper endoscopy (low yield) if all else fails in hopes of finding something useful regarding her symptom complex

## 2019-05-23 NOTE — Patient Outreach (Signed)
[facility]) Care Management  05/23/2019  ANJEANETTE PETZOLD 19-Nov-1950 [id number]   No response from patient outreach attempts will proceed with case closure.     Objective:Per KPN (Knowledge Performance Now, point of care tool) and chart review,patient hospitalized 05/04/2019 - 05/07/2019 for nausea vomiting,hospitalized 04/15/2019 -04/19/2019 forLarge cell carcinoma of left lung, stage 4,Neutropenia,Pancytopenia. Patient also has a history of metastatic high-grade neuroendocrine carcinoma, hyperlipidemia, nonocclusive thrombus under the Port-A-Cath,Chronic kidney disease,kidney stones,radiation therapy,Pyelonephritisand UTI.      Assessment: Received Medicare EMMI General Discharge Red Alert Flag follow up referral on 05/10/2019. Red Flag Alert triggers times 2, Day #1, patient answered no to the following questions:Transportation to follow-up?Taking meds?EMMI follow up not completed due to unable to contact patient and will proceed with case closure.      Plan:Case closure due to unable to reach.     Derinda Bartus H. Annia Friendly, BSN, [place] Management [facility] Telephonic CM Phone: [phone] Fax: [phone]

## 2019-05-24 ENCOUNTER — Other Ambulatory Visit: Payer: Self-pay

## 2019-05-24 ENCOUNTER — Ambulatory Visit: Payer: Medicare Other

## 2019-05-24 ENCOUNTER — Ambulatory Visit
Admission: RE | Admit: 2019-05-24 | Discharge: 2019-05-24 | Disposition: A | Discharge status: Home / Self Care | Payer: Medicare Other | Source: Ambulatory Visit | Attending: Radiation Oncology | Admitting: Radiation Oncology

## 2019-05-24 DIAGNOSIS — C7951 Secondary malignant neoplasm of bone: Secondary | ICD-10-CM | POA: Diagnosis not present

## 2019-05-24 DIAGNOSIS — Z923 Personal history of irradiation: Secondary | ICD-10-CM | POA: Diagnosis not present

## 2019-05-27 ENCOUNTER — Ambulatory Visit: Payer: Medicare Other

## 2019-05-28 ENCOUNTER — Ambulatory Visit: Payer: Medicare Other

## 2019-05-28 ENCOUNTER — Ambulatory Visit
Admission: RE | Admit: 2019-05-28 | Discharge: 2019-05-28 | Disposition: A | Discharge status: Home / Self Care | Payer: Medicare Other | Source: Ambulatory Visit | Attending: Radiation Oncology | Admitting: Radiation Oncology

## 2019-05-28 ENCOUNTER — Other Ambulatory Visit: Payer: Self-pay | Admitting: Radiation Oncology

## 2019-05-28 ENCOUNTER — Other Ambulatory Visit: Payer: Self-pay

## 2019-05-28 DIAGNOSIS — C7951 Secondary malignant neoplasm of bone: Secondary | ICD-10-CM

## 2019-05-28 DIAGNOSIS — Z923 Personal history of irradiation: Secondary | ICD-10-CM | POA: Diagnosis not present

## 2019-05-29 ENCOUNTER — Ambulatory Visit
Admission: RE | Admit: 2019-05-29 | Discharge: 2019-05-29 | Disposition: A | Discharge status: Home / Self Care | Payer: Medicare Other | Source: Ambulatory Visit | Attending: Radiation Oncology | Admitting: Radiation Oncology

## 2019-05-29 ENCOUNTER — Other Ambulatory Visit: Payer: Self-pay

## 2019-05-30 ENCOUNTER — Ambulatory Visit: Payer: Medicare Other

## 2019-05-30 ENCOUNTER — Ambulatory Visit
Admission: RE | Admit: 2019-05-30 | Discharge: 2019-05-30 | Disposition: A | Discharge status: Home / Self Care | Payer: Medicare Other | Source: Ambulatory Visit | Attending: Radiation Oncology | Admitting: Radiation Oncology

## 2019-05-30 ENCOUNTER — Other Ambulatory Visit: Payer: Self-pay

## 2019-05-30 DIAGNOSIS — Z923 Personal history of irradiation: Secondary | ICD-10-CM | POA: Diagnosis not present

## 2019-05-30 DIAGNOSIS — C7951 Secondary malignant neoplasm of bone: Secondary | ICD-10-CM | POA: Diagnosis not present

## 2019-05-31 ENCOUNTER — Ambulatory Visit
Admission: RE | Admit: 2019-05-31 | Discharge: 2019-05-31 | Disposition: A | Discharge status: Home / Self Care | Payer: Medicare Other | Source: Ambulatory Visit | Attending: Radiation Oncology | Admitting: Radiation Oncology

## 2019-05-31 ENCOUNTER — Other Ambulatory Visit: Payer: Self-pay

## 2019-05-31 ENCOUNTER — Ambulatory Visit: Payer: Medicare Other

## 2019-05-31 DIAGNOSIS — C7951 Secondary malignant neoplasm of bone: Secondary | ICD-10-CM | POA: Diagnosis not present

## 2019-05-31 DIAGNOSIS — Z923 Personal history of irradiation: Secondary | ICD-10-CM | POA: Diagnosis not present

## 2019-06-03 ENCOUNTER — Encounter: Payer: Self-pay | Admitting: Internal Medicine

## 2019-06-03 ENCOUNTER — Other Ambulatory Visit: Payer: Self-pay | Admitting: Radiation Oncology

## 2019-06-03 ENCOUNTER — Ambulatory Visit
Admission: RE | Admit: 2019-06-03 | Discharge: 2019-06-03 | Disposition: A | Discharge status: Home / Self Care | Payer: Medicare Other | Source: Ambulatory Visit | Attending: Radiation Oncology | Admitting: Radiation Oncology

## 2019-06-03 ENCOUNTER — Inpatient Hospital Stay: Payer: Medicare Other

## 2019-06-03 ENCOUNTER — Encounter: Payer: Self-pay | Admitting: Radiation Oncology

## 2019-06-03 ENCOUNTER — Other Ambulatory Visit: Payer: Self-pay | Admitting: Medical Oncology

## 2019-06-03 ENCOUNTER — Telehealth: Payer: Self-pay | Admitting: Internal Medicine

## 2019-06-03 ENCOUNTER — Other Ambulatory Visit: Payer: Self-pay

## 2019-06-03 ENCOUNTER — Inpatient Hospital Stay (HOSPITAL_BASED_OUTPATIENT_CLINIC_OR_DEPARTMENT_OTHER): Payer: Medicare Other | Admitting: Internal Medicine

## 2019-06-03 ENCOUNTER — Telehealth: Payer: Self-pay | Admitting: *Deleted

## 2019-06-03 VITALS — BP 102/71 | HR 90 | Temp 98.5°F | Resp 18 | Ht 66.0 in | Wt 131.5 lb

## 2019-06-03 DIAGNOSIS — Z7901 Long term (current) use of anticoagulants: Secondary | ICD-10-CM

## 2019-06-03 DIAGNOSIS — Z923 Personal history of irradiation: Secondary | ICD-10-CM

## 2019-06-03 DIAGNOSIS — Z5112 Encounter for antineoplastic immunotherapy: Secondary | ICD-10-CM

## 2019-06-03 DIAGNOSIS — E876 Hypokalemia: Secondary | ICD-10-CM

## 2019-06-03 DIAGNOSIS — Z79899 Other long term (current) drug therapy: Secondary | ICD-10-CM

## 2019-06-03 DIAGNOSIS — Z87442 Personal history of urinary calculi: Secondary | ICD-10-CM

## 2019-06-03 DIAGNOSIS — M1990 Unspecified osteoarthritis, unspecified site: Secondary | ICD-10-CM

## 2019-06-03 DIAGNOSIS — C7A1 Malignant poorly differentiated neuroendocrine tumors: Secondary | ICD-10-CM | POA: Diagnosis not present

## 2019-06-03 DIAGNOSIS — C7A8 Other malignant neuroendocrine tumors: Secondary | ICD-10-CM

## 2019-06-03 DIAGNOSIS — C3492 Malignant neoplasm of unspecified part of left bronchus or lung: Secondary | ICD-10-CM

## 2019-06-03 DIAGNOSIS — C7951 Secondary malignant neoplasm of bone: Secondary | ICD-10-CM

## 2019-06-03 DIAGNOSIS — M546 Pain in thoracic spine: Secondary | ICD-10-CM

## 2019-06-03 DIAGNOSIS — R0781 Pleurodynia: Secondary | ICD-10-CM | POA: Diagnosis not present

## 2019-06-03 DIAGNOSIS — R5382 Chronic fatigue, unspecified: Secondary | ICD-10-CM

## 2019-06-03 DIAGNOSIS — C3490 Malignant neoplasm of unspecified part of unspecified bronchus or lung: Secondary | ICD-10-CM

## 2019-06-03 LAB — CBC WITH DIFFERENTIAL (CANCER CENTER ONLY)
Abs Immature Granulocytes: 0.01 10*3/uL (ref 0.00–0.07)
Basophils Absolute: 0 10*3/uL (ref 0.0–0.1)
Basophils Relative: 0 %
Eosinophils Absolute: 0.2 10*3/uL (ref 0.0–0.5)
Eosinophils Relative: 5 %
HCT: 35.6 % — ABNORMAL LOW (ref 36.0–46.0)
Hemoglobin: 11.9 g/dL — ABNORMAL LOW (ref 12.0–15.0)
Immature Granulocytes: 0 %
Lymphocytes Relative: 9 %
Lymphs Abs: 0.4 10*3/uL — ABNORMAL LOW (ref 0.7–4.0)
MCH: 33.9 pg (ref 26.0–34.0)
MCHC: 33.4 g/dL (ref 30.0–36.0)
MCV: 101.4 fL — ABNORMAL HIGH (ref 80.0–100.0)
Monocytes Absolute: 0.7 10*3/uL (ref 0.1–1.0)
Monocytes Relative: 14 %
Neutro Abs: 3.3 10*3/uL (ref 1.7–7.7)
Neutrophils Relative %: 72 %
Platelet Count: 202 10*3/uL (ref 150–400)
RBC: 3.51 MIL/uL — ABNORMAL LOW (ref 3.87–5.11)
RDW: 17.2 % — ABNORMAL HIGH (ref 11.5–15.5)
WBC Count: 4.6 10*3/uL (ref 4.0–10.5)
nRBC: 0 % (ref 0.0–0.2)

## 2019-06-03 LAB — CMP (CANCER CENTER ONLY)
ALT: 20 U/L (ref 0–44)
AST: 46 U/L — ABNORMAL HIGH (ref 15–41)
Albumin: 3.2 g/dL — ABNORMAL LOW (ref 3.5–5.0)
Alkaline Phosphatase: 111 U/L (ref 38–126)
Anion gap: 13 (ref 5–15)
BUN: 14 mg/dL (ref 8–23)
CO2: 23 mmol/L (ref 22–32)
Calcium: 8.9 mg/dL (ref 8.9–10.3)
Chloride: 104 mmol/L (ref 98–111)
Creatinine: 1.01 mg/dL — ABNORMAL HIGH (ref 0.44–1.00)
GFR, Est AFR Am: >60 mL/min (ref >60)
GFR, Estimated: 57 mL/min — ABNORMAL LOW (ref >60)
Glucose, Bld: 124 mg/dL — ABNORMAL HIGH (ref 70–99)
Potassium: 2.5 mmol/L — CL (ref 3.5–5.1)
Sodium: 140 mmol/L (ref 135–145)
Total Bilirubin: 0.5 mg/dL (ref 0.3–1.2)
Total Protein: 7.3 g/dL (ref 6.5–8.1)

## 2019-06-03 LAB — TSH: TSH: <0.08 u[IU]/mL — ABNORMAL LOW (ref 0.308–3.960)

## 2019-06-03 MED ORDER — OXYCODONE HCL 5 MG PO TABS: 5.0000 mg | ORAL_TABLET | ORAL | 0 refills | Status: AC | PRN

## 2019-06-03 MED ORDER — POTASSIUM CHLORIDE CRYS ER 20 MEQ PO TBCR: 20.0000 meq | EXTENDED_RELEASE_TABLET | Freq: Two times a day (BID) | ORAL | 0 refills | Status: DC

## 2019-06-03 MED ORDER — SODIUM CHLORIDE 0.9% FLUSH
10.0000 mL | INTRAVENOUS | Status: DC | PRN
  Administered 2019-06-03: 10 mL
  Filled 2019-06-03: qty 10

## 2019-06-03 MED ORDER — SODIUM CHLORIDE 0.9 % IV SOLN
Freq: Once | INTRAVENOUS | Status: AC
  Administered 2019-06-03: 13:00:00 via INTRAVENOUS
  Filled 2019-06-03: qty 20

## 2019-06-03 MED ORDER — SODIUM CHLORIDE 0.9 % IV SOLN
Freq: Once | INTRAVENOUS | Status: AC
  Administered 2019-06-03: 12:00:00 via INTRAVENOUS
  Filled 2019-06-03: qty 250

## 2019-06-03 MED ORDER — HEPARIN SOD (PORK) LOCK FLUSH 100 UNIT/ML IV SOLN
500.0000 [IU] | Freq: Once | INTRAVENOUS | Status: AC | PRN
  Administered 2019-06-03: 500 [IU]
  Filled 2019-06-03: qty 5

## 2019-06-03 MED ORDER — SODIUM CHLORIDE 0.9 % IV SOLN
200.0000 mg | Freq: Once | INTRAVENOUS | Status: AC
  Administered 2019-06-03: 200 mg via INTRAVENOUS
  Filled 2019-06-03: qty 8

## 2019-06-03 NOTE — Telephone Encounter (Signed)
Called patient to give an update, regarding Dr. Maryjean Ka' appt., voice mail full unable to leave message

## 2019-06-03 NOTE — Patient Instructions (Signed)
[facility] Discharge Instructions for Patients Receiving Chemotherapy  Today you received the following chemotherapy agents: Keytruda   To help prevent nausea and vomiting after your treatment, we encourage you to take your nausea medication as directed.    If you develop nausea and vomiting that is not controlled by your nausea medication, call the clinic.   BELOW ARE SYMPTOMS THAT SHOULD BE REPORTED IMMEDIATELY:  *FEVER GREATER THAN 100.5 F  *CHILLS WITH OR WITHOUT FEVER  NAUSEA AND VOMITING THAT IS NOT CONTROLLED WITH YOUR NAUSEA MEDICATION  *UNUSUAL SHORTNESS OF BREATH  *UNUSUAL BRUISING OR BLEEDING  TENDERNESS IN MOUTH AND THROAT WITH OR WITHOUT PRESENCE OF ULCERS  *URINARY PROBLEMS  *BOWEL PROBLEMS  UNUSUAL RASH Items with * indicate a potential emergency and should be followed up as soon as possible.  Feel free to call the clinic should you have any questions or concerns. The clinic phone number is [phone].  Please show the [facility] at check-in to the Emergency Department and triage nurse.  Potassium chloride injection What is this medicine? POTASSIUM CHLORIDE (poe TASS i um KLOOR ide) is a potassium supplement used to prevent and to treat low potassium. Potassium is important for the heart, muscles, and nerves. Too much or too little potassium in the body can cause serious problems. This medicine may be used for other purposes; ask your health care provider or pharmacist if you have questions. COMMON BRAND NAME(S): PROAMP What should I tell my health care provider before I take this medicine? They need to know if you have any of these conditions:  Addison's disease  dehydration  diabetes  heart disease  high levels of potassium in the blood  irregular heartbeat  kidney disease  recent severe burn  an unusual or allergic reaction to potassium, other medicines, foods, dyes, or preservatives  pregnant or trying to get  pregnant  breast-feeding How should I use this medicine? This medicine is for infusion into a vein. It is given by a health care professional in a hospital or clinic setting. Talk to your pediatrician regarding the use of this medicine in children. Special care may be needed. Overdosage: If you think you have taken too much of this medicine contact a poison control center or emergency room at once. NOTE: This medicine is only for you. Do not share this medicine with others. What if I miss a dose? This does not apply. What may interact with this medicine? Do not take this medicine with any of the following medications:  certain diuretics such as spironolactone, triamterene  eplerenone  sodium polystyrene sulfonate This medicine may also interact with the following medications:  certain medicines for blood pressure or heart disease like lisinopril, losartan, quinapril, valsartan  medicines that lower your chance of fighting infection such as cyclosporine, tacrolimus  NSAIDs, medicines for pain and inflammation, like ibuprofen or naproxen  other potassium supplements  salt substitutes This list may not describe all possible interactions. Give your health care provider a list of all the medicines, herbs, non-prescription drugs, or dietary supplements you use. Also tell them if you smoke, drink alcohol, or use illegal drugs. Some items may interact with your medicine. What should I watch for while using this medicine? Your condition will be monitored carefully while you are receiving this medicine. You may need blood work done while you are taking this medicine. What side effects may I notice from receiving this medicine? Side effects that you should report to your doctor  or health care professional as soon as possible:  allergic reactions like skin rash, itching or hives, swelling of the face, lips, or tongue  breathing problems  confusion  fast, irregular heartbeat  feeling  faint or lightheaded, falls  low blood pressure  numbness or tingling in hands or feet  pain, redness, or irritation at site where injected  unusually weak or tired  weakness, heaviness of legs Side effects that usually do not require medical attention (report to your doctor or health care professional if they continue or are bothersome):  diarrhea  nausea, vomiting  stomach pain This list may not describe all possible side effects. Call your doctor for medical advice about side effects. You may report side effects to FDA at 1-800-FDA-1088. Where should I keep my medicine? This drug is given in a hospital or clinic and will not be stored at home. NOTE: This sheet is a summary. It may not cover all possible information. If you have questions about this medicine, talk to your doctor, pharmacist, or health care provider.  2020 Elsevier/Gold Standard (2016-08-03 13:59:20)

## 2019-06-03 NOTE — Telephone Encounter (Signed)
Scheduled appt per 7/20 los - added additional appts - pt to get an updated schedule.

## 2019-06-03 NOTE — Progress Notes (Signed)
[facility] [phone]   [phone]  OFFICE PROGRESS NOTE  Alison Koch, MD [place] [address]  DIAGNOSIS: Stage IV (T1b, N2, M1c) 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  She presented with left upper lobe lung nodule in addition to mediastinal lymphadenopathy as well as right upper lobe as well as metastatic bone disease to the sixth and 10th rib.  The patient has significant pain from her metastatic rib lesions.  Molecular studies by [employer] 360 showed no actionable mutations.  PRIOR THERAPY:  1) systemic chemotherapy with carboplatin for AUC of 5 on day 1, etoposide 100 mg/M2 on days 1, 2 and 3 as well as Tecentriq 1200 mg IV every 3 weeks with Neulasta support.  Status post 2 cycles, discontinued secondary to disease progression. 2) palliative radiotherapy to progressive T10 vertebral body bone lesion under the care of Dr. Isidore Moos .  CURRENT THERAPY: Systemic chemotherapy with carboplatin for AUC of 5, Alimta 500 mg/M2 and Keytruda 200 mg IV every 3 weeks.  First dose December 17, 2018.  Status post 8 cycles.  Starting cycle #5 the patient is currently on maintenance treatment with Alimta and Keytruda.  Starting from cycle #7 she was on single agent Keytruda.  INTERVAL HISTORY: Alison Sullivan 68 y.o. female returns to the clinic today for follow-up visit.  The patient continues to complain of the mid back pain.  She did not have much relief after the palliative radiotherapy.  She continues on the current pain medication with oxycodone and gabapentin.  She was referred by Dr. Isidore Moos to Dr. Lovenia Shuck for evaluation and pain management.  She has not had the appointment is scheduled yet.  She denied having any chest pain, shortness of breath, cough or hemoptysis.  She denied having any fever or chills.  She has no nausea, vomiting, diarrhea or constipation.  She  continues to tolerate her treatment with Keytruda fairly well.  The patient is here today for evaluation before starting cycle #9.    MEDICAL HISTORY: Past Medical History:  Diagnosis Date   Arthritis    Chronic kidney disease    Dog bite of wrist    History of kidney stones    History of radiation therapy 10/23/18- 11/06/18   Left lower rib mass/ 30 Gy delivered in 10 fractions 3 Gy.    Kidney infection    Left renal mass    Lung cancer (HCC)    Metastatic bone cancer (HCC)    Osteopenia    Pyelonephritis    Recurrent UTI (urinary tract infection)     ALLERGIES:  is allergic to barium sulfate.  MEDICATIONS:  Current Outpatient Medications  Medication Sig Dispense Refill   ALPRAZolam (XANAX) 0.25 MG tablet Take 1 tablet (0.25 mg total) by mouth 2 (two) times daily as needed for anxiety. 30 tablet 0   bisacodyl (BISACODYL) 5 MG EC tablet Take 5 mg by mouth daily as needed for moderate constipation.     folic acid (FOLVITE) 1 MG tablet Take 1 tablet (1 mg total) by mouth daily. (Patient taking differently: Take 1 mg by mouth every evening. ) 30 tablet 4   gabapentin (NEURONTIN) 300 MG capsule Take 1 capsule (300 mg total) by mouth 3 (three) times daily as needed (pain). 90 capsule 2   Hydrocortisone (GERHARDT'S BUTT CREAM) CREA Apply 1 application topically daily. 1 each 0   hydrOXYzine (ATARAX/VISTARIL) 25  MG tablet Take 1 tablet (25 mg total) by mouth 3 (three) times daily as needed for nausea (use Zofran 1st). 30 tablet 0   lidocaine-prilocaine (EMLA) cream Apply 1 application topically as needed. (Patient taking differently: Apply 1 application topically as needed (For port-a-cath.). ) 30 g 0   magic mouthwash SOLN Take 5 mLs by mouth 4 (four) times daily as needed for mouth pain. Components - Hydrocortisone 60 mg, Nystatin suspension 30 ml , Benadryl 12.5mg  /5 ml QS 240 ml. 240 mL 1   Melatonin 10 MG TABS Take 10 mg by mouth at bedtime.      Multiple  Vitamin (MULTIVITAMIN WITH MINERALS) TABS tablet Take 1 tablet by mouth daily. 30 tablet 0   omeprazole (PRILOSEC) 40 MG capsule TAKE 1 CAPSULE(40 MG) BY MOUTH DAILY 30 capsule 2   ondansetron (ZOFRAN) 4 MG tablet Take 1 tablet (4 mg total) by mouth 3 (three) times daily as needed for nausea or vomiting. 90 tablet 3   oxyCODONE (OXY IR/ROXICODONE) 5 MG immediate release tablet Take 1-2 tablets (5-10 mg total) by mouth every 4 (four) hours as needed for severe pain. 90 tablet 0   OxyCODONE HCl, Abuse Deter, (OXAYDO) 5 MG TABA      prochlorperazine (COMPAZINE) 10 MG tablet Take 1 tablet (10 mg total) by mouth every 6 (six) hours as needed for nausea or vomiting. 30 tablet 0   warfarin (COUMADIN) 5 MG tablet TAKE 1 TABLET(5 MG) BY MOUTH DAILY 90 tablet 1   magnesium oxide (MAG-OX) 400 (241.3 Mg) MG tablet Take 1 tablet (400 mg total) by mouth 2 (two) times daily. (Patient not taking: Reported on 06/03/2019) 60 tablet 0   potassium chloride (K-DUR) 10 MEQ tablet Take 1 tablet (10 mEq total) by mouth 2 (two) times daily. (Patient not taking: Reported on 06/03/2019) 20 tablet 0   No current facility-administered medications for this visit.     SURGICAL HISTORY:  Past Surgical History:  Procedure Laterality Date   CYSTOSCOPY WITH RETROGRADE PYELOGRAM, URETEROSCOPY AND STENT PLACEMENT Left 12/28/2017   Procedure: CYSTOSCOPY WITH RETROGRADE PYELOGRAM, URETEROSCOPY AND STENT PLACEMENT, STONE BASKETRY;  Surgeon: Festus Aloe, MD;  Location: [facility];  Service: Urology;  Laterality: Left;   FINGER SURGERY     index, growth removal   HOLMIUM LASER APPLICATION Left 01/25/9701   Procedure: HOLMIUM LASER APPLICATION;  Surgeon: Festus Aloe, MD;  Location: [facility];  Service: Urology;  Laterality: Left;   IR IMAGING GUIDED PORT INSERTION  10/31/2018   KIDNEY STONE SURGERY  2011 or 2012   LITHOTRIPSY  2007    REVIEW OF SYSTEMS:  A comprehensive review  of systems was negative except for: Constitutional: positive for fatigue Musculoskeletal: positive for back pain   PHYSICAL EXAMINATION: General appearance: alert, cooperative, fatigued and no distress Head: Normocephalic, without obvious abnormality, atraumatic Neck: no adenopathy, no JVD, supple, symmetrical, trachea midline and thyroid not enlarged, symmetric, no tenderness/mass/nodules Lymph nodes: Cervical, supraclavicular, and axillary nodes normal. Resp: clear to auscultation bilaterally Back: symmetric, no curvature. ROM normal. No CVA tenderness. Cardio: regular rate and rhythm, S1, S2 normal, no murmur, click, rub or gallop GI: soft, non-tender; bowel sounds normal; no masses,  no organomegaly Extremities: extremities normal, atraumatic, no cyanosis or edema  ECOG PERFORMANCE STATUS: 1 - Symptomatic but completely ambulatory  Blood pressure 102/71, pulse 90, temperature 98.5 F (36.9 C), temperature source Oral, resp. rate 18, height 5\' 6"  (1.676 m), weight 131 lb 8 oz (59.6 kg),  SpO2 96 %.  LABORATORY DATA: Lab Results  Component Value Date   WBC 6.9 05/13/2019   HGB 10.9 (L) 05/13/2019   HCT 33.4 (L) 05/13/2019   MCV 100.9 (H) 05/13/2019   PLT 212 05/13/2019      Chemistry      Component Value Date/Time   NA 139 05/13/2019 0913   K 2.5 (LL) 05/13/2019 0913   CL 105 05/13/2019 0913   CO2 21 (L) 05/13/2019 0913   BUN 17 05/13/2019 0913   CREATININE 1.09 (H) 05/13/2019 0913      Component Value Date/Time   CALCIUM 8.6 (L) 05/13/2019 0913   ALKPHOS 117 05/13/2019 0913   AST 36 05/13/2019 0913   ALT 18 05/13/2019 0913   BILITOT 0.4 05/13/2019 0913       RADIOGRAPHIC STUDIES: No results found.  ASSESSMENT AND PLAN: This is a very pleasant 68 years old white female recently diagnosed with high-grade neuroendocrine carcinoma.  Molecular study shows no actionable mutations by [employer]. The patient underwent systemic chemotherapy with small cell regimen  including carboplatin, etoposide and Tecentriq status post 2 cycles. She tolerated her treatment well but unfortunately repeat CT scan of the chest, abdomen and pelvis showed evidence for disease progression. The patient was started on systemic chemotherapy with carboplatin, Alimta and Keytruda status post 8 cycles.  Starting from cycle #7 the patient is on treatment with single agent Keytruda secondary to intolerance of the Alimta. I recommended for her to proceed with cycle #9 today as planned. I will see her back for follow-up visit in 3 weeks for evaluation with repeat CT scan of the chest, abdomen and pelvis for restaging of her disease. For the hypokalemia will arrange for the patient to receive intravenous potassium chloride today. For the enlarging lesion in the left 10th rib, the patient underwent palliative radiotherapy under the care of Dr. Isidore Moos but has no relief of her pain.  She was referred to the pain clinic for further evaluation and recommendation. For the suspicious nonocclusive blood clot at the tip of the Port-A-Cath, she was started on Coumadin 5 mg p.o. daily.  We will continue to monitor her PT/INR closely. For the anxiety and gagging reflex with medication, she is currently on Xanax 0.25 mg p.o. twice daily as needed. The patient was advised to call immediately if she has any concerning symptoms in the interval. The patient voices understanding of current disease status and treatment options and is in agreement with the current care plan. All questions were answered. The patient knows to call the clinic with any problems, questions or concerns. We can certainly see the patient much sooner if necessary.  Disclaimer: This note was dictated with voice recognition software. Similar sounding words can inadvertently be transcribed and may not be corrected upon review.

## 2019-06-03 NOTE — Progress Notes (Signed)
NS 548mL with KCL 27mEq started at 1300, and then paused at 1330 for Keytruda infusion. Resumed at 1409 after NS flush. This infusion was manually programmed due to failure of interop communication with pump. NS 521mL with KCL 52mEq to complete at 1739.

## 2019-06-04 ENCOUNTER — Telehealth: Payer: Self-pay | Admitting: *Deleted

## 2019-06-04 NOTE — Telephone Encounter (Signed)
[name] patient regarding upcoming CT scan-CAP.  Discussed oral contrast with her. Pt does not tolerate barium based contrast-nausea/vomiting/diarrhea. Offered water based contrast. Pt declined this as well. She states she cannot sit in Radiology for 2 hours prior to scan drinking this.  She states that Dr. Julien Nordmann has told her it is ok for her to not drink any contrast. No further questions or concerns.  CT scan scheduled for 06/21/19

## 2019-06-05 DIAGNOSIS — G588 Other specified mononeuropathies: Secondary | ICD-10-CM | POA: Diagnosis not present

## 2019-06-05 DIAGNOSIS — C7951 Secondary malignant neoplasm of bone: Secondary | ICD-10-CM | POA: Diagnosis not present

## 2019-06-06 ENCOUNTER — Ambulatory Visit: Payer: Medicare Other | Admitting: Internal Medicine

## 2019-06-07 ENCOUNTER — Other Ambulatory Visit: Payer: Self-pay | Admitting: Anesthesiology

## 2019-06-07 DIAGNOSIS — M5412 Radiculopathy, cervical region: Secondary | ICD-10-CM

## 2019-06-11 ENCOUNTER — Ambulatory Visit: Payer: Medicare Other

## 2019-06-11 NOTE — Progress Notes (Signed)
Nutrition Follow-up:  Patient with stage IV lung cancer with metastatic disease to thoracic spine and ribs.  Patient has completed radiation. Currently receiving [place] and alimta.  Spoke with patient via phone for nutrition follow-up.  Patient reports that she has not had a good day due to pain.  Agreeable to talking to RD at this time.  Patient reports that her gagging with pills has improved.  Reports that breakfast usually consists of salty pretzel bar and boost breeze.  Report she rarely eats lunch. If she does it is usually a peanut butter and jelly sandwich.  Reports recently her aide that comes Monday and Wednesday fixed her an all vegetable soup (vegetables with dried beans) that she has been eating at night.    Reports that she is drinking 1 boost breeze per day.   GI notes reviewed.  Medications: reviewed  Labs: reviewed  Anthropometrics:   Weight 131 lb 8 oz on 7/20 decreased from 133 lb 14.4 oz on 6/29.     NUTRITION DIAGNOSIS: Inadequate oral intake continues   INTERVENTION:  Discussed ways to add high protein and calories to current eating pattern.   Encouraged drinking boost breeze 2 times per day.  Also discussed boost soothe as option (higher calories and protein than boost breeze).  Will mail coupons to patient.  Contact information provided to patient.  Patient reports that she will contact RD if needed further.     MONITORING, EVALUATION, GOAL: Patient will consume adequate calories and protein to prevent further weight loss   NEXT VISIT: patient to contact RD  Hrithik Boschee B. Zenia Resides, [place], [place] Registered Dietitian [phone] (pager)

## 2019-06-13 DIAGNOSIS — C7951 Secondary malignant neoplasm of bone: Secondary | ICD-10-CM | POA: Diagnosis not present

## 2019-06-13 DIAGNOSIS — G588 Other specified mononeuropathies: Secondary | ICD-10-CM | POA: Diagnosis not present

## 2019-06-13 NOTE — Progress Notes (Signed)
  Patient Name: Alison Sullivan MRN: [id number] DOB: November 07, 1951 Referring Physician: Christinia Gully (Profile Not Attached) Date of Service: 06/03/2019 [facility], [place]                                                        End Of Treatment Note  Diagnoses: C79.51-Secondary malignant neoplasm of bone  Cancer Staging: Stage IVlung cancerwith metastatic diseaseto the thoracic spine and ribs  Intent: Palliative/retreatment  Radiation Treatment Dates: 05/13/2019 through 06/03/2019 Site Technique Total Dose Dose per Fx Completed Fx Beam Energies  Thorax: Chest_Lt_re_Tx IMRT 32.5/32.5 2.5 13/13 6X   Narrative: The patient tolerated radiation therapy relatively well. She experienced increasing fatigue and dysphagia. She reports pain with movement and trying to lay down due to tumor location and pain sites along left side. She is taking oxycodone which helps with discomfort. On exam, she has edema in the area of soft tissue left rib mass, and skin is dry.  Plan: The patient will follow-up with radiation oncology in one month. Patient's oxycodone refilled. Patient referred to Dr. Maryjean Ka.  ________________________________________________  [name], MD  This document serves as a record of services personally performed by [name], MD. It was created on her behalf by Rae Lips, a trained medical scribe. The creation of this record is based on the scribe's personal observations and the provider's statements to them. This document has been checked and approved by the attending provider.

## 2019-06-17 ENCOUNTER — Encounter (HOSPITAL_COMMUNITY): Payer: Self-pay

## 2019-06-17 ENCOUNTER — Other Ambulatory Visit: Payer: Self-pay

## 2019-06-17 ENCOUNTER — Emergency Department (HOSPITAL_COMMUNITY): Payer: Medicare Other

## 2019-06-17 ENCOUNTER — Emergency Department (HOSPITAL_COMMUNITY)
Admission: EM | Admit: 2019-06-17 | Discharge: 2019-06-17 | Disposition: A | Discharge status: Home / Self Care | Payer: Medicare Other | Attending: Emergency Medicine | Admitting: Emergency Medicine

## 2019-06-17 DIAGNOSIS — Z85118 Personal history of other malignant neoplasm of bronchus and lung: Secondary | ICD-10-CM | POA: Diagnosis not present

## 2019-06-17 DIAGNOSIS — Z7901 Long term (current) use of anticoagulants: Secondary | ICD-10-CM | POA: Insufficient documentation

## 2019-06-17 DIAGNOSIS — C7951 Secondary malignant neoplasm of bone: Secondary | ICD-10-CM | POA: Diagnosis not present

## 2019-06-17 DIAGNOSIS — C799 Secondary malignant neoplasm of unspecified site: Secondary | ICD-10-CM

## 2019-06-17 DIAGNOSIS — Z20828 Contact with and (suspected) exposure to other viral communicable diseases: Secondary | ICD-10-CM | POA: Insufficient documentation

## 2019-06-17 DIAGNOSIS — N189 Chronic kidney disease, unspecified: Secondary | ICD-10-CM | POA: Insufficient documentation

## 2019-06-17 DIAGNOSIS — C3492 Malignant neoplasm of unspecified part of left bronchus or lung: Secondary | ICD-10-CM | POA: Insufficient documentation

## 2019-06-17 DIAGNOSIS — M79602 Pain in left arm: Secondary | ICD-10-CM | POA: Insufficient documentation

## 2019-06-17 DIAGNOSIS — Z03818 Encounter for observation for suspected exposure to other biological agents ruled out: Secondary | ICD-10-CM | POA: Diagnosis not present

## 2019-06-17 DIAGNOSIS — M5412 Radiculopathy, cervical region: Secondary | ICD-10-CM

## 2019-06-17 DIAGNOSIS — Z87891 Personal history of nicotine dependence: Secondary | ICD-10-CM | POA: Diagnosis not present

## 2019-06-17 LAB — BASIC METABOLIC PANEL
Anion gap: 7 (ref 5–15)
BUN: 14 mg/dL (ref 8–23)
CO2: 23 mmol/L (ref 22–32)
Calcium: 7.5 mg/dL — ABNORMAL LOW (ref 8.9–10.3)
Chloride: 111 mmol/L (ref 98–111)
Creatinine, Ser: 0.65 mg/dL (ref 0.44–1.00)
GFR calc Af Amer: >60 mL/min (ref >60)
GFR calc non Af Amer: >60 mL/min (ref >60)
Glucose, Bld: 98 mg/dL (ref 70–99)
Potassium: 2.7 mmol/L — CL (ref 3.5–5.1)
Sodium: 141 mmol/L (ref 135–145)

## 2019-06-17 LAB — CBC WITH DIFFERENTIAL/PLATELET
Abs Immature Granulocytes: 0.01 10*3/uL (ref 0.00–0.07)
Basophils Absolute: 0 10*3/uL (ref 0.0–0.1)
Basophils Relative: 0 %
Eosinophils Absolute: 0 10*3/uL (ref 0.0–0.5)
Eosinophils Relative: 0 %
HCT: 33.6 % — ABNORMAL LOW (ref 36.0–46.0)
Hemoglobin: 10.7 g/dL — ABNORMAL LOW (ref 12.0–15.0)
Immature Granulocytes: 0 %
Lymphocytes Relative: 20 %
Lymphs Abs: 0.9 10*3/uL (ref 0.7–4.0)
MCH: 33.3 pg (ref 26.0–34.0)
MCHC: 31.8 g/dL (ref 30.0–36.0)
MCV: 104.7 fL — ABNORMAL HIGH (ref 80.0–100.0)
Monocytes Absolute: 0.6 10*3/uL (ref 0.1–1.0)
Monocytes Relative: 14 %
Neutro Abs: 2.7 10*3/uL (ref 1.7–7.7)
Neutrophils Relative %: 66 %
Platelets: 166 10*3/uL (ref 150–400)
RBC: 3.21 MIL/uL — ABNORMAL LOW (ref 3.87–5.11)
RDW: 15.1 % (ref 11.5–15.5)
WBC: 4.2 10*3/uL (ref 4.0–10.5)
nRBC: 0 % (ref 0.0–0.2)

## 2019-06-17 LAB — SARS CORONAVIRUS 2 BY RT PCR (HOSPITAL ORDER, PERFORMED IN ~~LOC~~ HOSPITAL LAB): SARS Coronavirus 2: NEGATIVE

## 2019-06-17 LAB — PROTIME-INR
INR: 1.1 (ref 0.8–1.2)
Prothrombin Time: 13.9 seconds (ref 11.4–15.2)

## 2019-06-17 MED ORDER — OXYCODONE HCL 5 MG PO TABS: 5.0000 mg | ORAL_TABLET | ORAL | 0 refills | Status: AC | PRN

## 2019-06-17 MED ORDER — ONDANSETRON HCL 4 MG/2ML IJ SOLN
4.0000 mg | Freq: Once | INTRAMUSCULAR | Status: AC
  Administered 2019-06-17: 15:00:00 4 mg via INTRAVENOUS
  Filled 2019-06-17: qty 2

## 2019-06-17 MED ORDER — DEXAMETHASONE 4 MG PO TABS: 4.0000 mg | ORAL_TABLET | Freq: Two times a day (BID) | ORAL | 0 refills | Status: DC

## 2019-06-17 MED ORDER — HYDROMORPHONE HCL 2 MG PO TABS: 1.0000 mg | ORAL_TABLET | ORAL | 0 refills | Status: DC | PRN

## 2019-06-17 MED ORDER — GADOBUTROL 1 MMOL/ML IV SOLN
7.0000 mL | Freq: Once | INTRAVENOUS | Status: AC | PRN
  Administered 2019-06-17: 18:00:00 7 mL via INTRAVENOUS

## 2019-06-17 MED ORDER — POTASSIUM CHLORIDE 10 MEQ/100ML IV SOLN
10.0000 meq | Freq: Once | INTRAVENOUS | Status: AC
  Administered 2019-06-17: 20:00:00 10 meq via INTRAVENOUS
  Filled 2019-06-17: qty 100

## 2019-06-17 MED ORDER — OXYCODONE HCL 5 MG PO TABS: 5.0000 mg | ORAL_TABLET | ORAL | 0 refills | Status: DC | PRN

## 2019-06-17 MED ORDER — HEPARIN SOD (PORK) LOCK FLUSH 100 UNIT/ML IV SOLN
500.0000 [IU] | Freq: Once | INTRAVENOUS | Status: AC
  Administered 2019-06-17: 500 [IU]
  Filled 2019-06-17: qty 5

## 2019-06-17 MED ORDER — HYDROMORPHONE HCL 1 MG/ML IJ SOLN
0.5000 mg | Freq: Once | INTRAMUSCULAR | Status: AC
  Administered 2019-06-17: 0.5 mg via INTRAVENOUS
  Filled 2019-06-17: qty 1

## 2019-06-17 MED ORDER — DEXAMETHASONE SODIUM PHOSPHATE 10 MG/ML IJ SOLN
20.0000 mg | Freq: Once | INTRAMUSCULAR | Status: AC
  Administered 2019-06-17: 20:00:00 20 mg via INTRAVENOUS
  Filled 2019-06-17: qty 2

## 2019-06-17 MED ORDER — DEXAMETHASONE 4 MG PO TABS: 4.0000 mg | ORAL_TABLET | Freq: Two times a day (BID) | ORAL | 0 refills | Status: AC

## 2019-06-17 MED ORDER — HYDROMORPHONE HCL 1 MG/ML IJ SOLN
1.0000 mg | INTRAMUSCULAR | Status: DC | PRN
  Administered 2019-06-17 (×2): 1 mg via INTRAVENOUS
  Filled 2019-06-17 (×2): qty 1

## 2019-06-17 MED ORDER — POTASSIUM CHLORIDE CRYS ER 20 MEQ PO TBCR
40.0000 meq | EXTENDED_RELEASE_TABLET | Freq: Once | ORAL | Status: AC
  Administered 2019-06-17: 40 meq via ORAL
  Filled 2019-06-17: qty 2

## 2019-06-17 NOTE — Discharge Instructions (Signed)
1.  You may take 1 to 2 mg of Dilaudid every 4 hours for pain control. 2.  Take Decadron as prescribed starting tomorrow evening.  Your doctor needs to follow-up on this to determine how long you should be taking this medication and at what dose. 3.  You have also been given a refill prescription on your oxycodone.  You may take this as needed.  Take care not to use Dilaudid and oxycodone at the same time due to risk of overdose and oversedation. 4.  Return to the emergency department if you are not having adequate pain control or other problems arise.

## 2019-06-17 NOTE — ED Notes (Signed)
Patient would like her port accessed. Placed an ice pack at her request before accessing.

## 2019-06-17 NOTE — ED Notes (Signed)
Dr. Jerilynn Mages. Pfeffier at bedside. Provided patient beef broth and a iced ginger ale.

## 2019-06-17 NOTE — ED Provider Notes (Addendum)
[facility] DEPT Provider Note   CSN: [id number] Arrival date & time: 06/17/19  1419     History   Chief Complaint Chief Complaint  Patient presents with  . Arm Pain    HPI Alison Sullivan is a 68 y.o. female with a past medical history of metastatic lung cancer with bone metastases who is referred to the emergency department for severe left arm pain.  The patient states that she has had several days of worsening pain radiating down the left arm with numbness and tingling in all of her fingers.  She saw a physical medicine specialist who has been doing injections for pain management of her known thoracic and rib mets but he states that he did not feel comfortable doing anything about her neck as he did not have any imaging available.  She is set up for MRI on 15 August.  She says that last night her pain was totally intolerable.  She has an existing prescription for gabapentin and oxycodone from her radiology oncologist but has been taking them sparingly as she has not needed to take them except for over the past 2 days when she has been taking them as directed every 4 hours.  She states that she called to try to get a refill on her pain medications from Dr. Lanell Persons who prescribed them however the physician is out of town.  She came into the emergency department for pain control.  She denies numbness or weakness in the left extremity.  She denies fevers or chills.  She rates her pain as 10 out of 10.     HPI  Past Medical History:  Diagnosis Date  . Arthritis   . Chronic kidney disease   . Dog bite of wrist   . History of kidney stones   . History of radiation therapy 10/23/18- 11/06/18   Left lower rib mass/ 30 Gy delivered in 10 fractions 3 Gy.   . Kidney infection   . Left renal mass   . Lung cancer ([place])   . Metastatic bone cancer ([place])   . Osteopenia   . Pyelonephritis   . Recurrent UTI (urinary tract infection)     Patient Active Problem  List   Diagnosis Date Noted  . Nausea & vomiting 05/04/2019  . Dehydration 05/04/2019  . Intractable nausea and vomiting 05/04/2019  . Metastatic cancer ([place])   . Pancytopenia ([place]) 04/15/2019  . 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 Neutropenia ([place]) 02/24/2019  . Diverticulitis of sigmoid colon 02/24/2019  . Hypokalemia 02/24/2019  . Thrombus port  A cath 02/24/2019  . Abscess of sigmoid colon due to diverticulitis 02/23/2019  . Long term current use of anticoagulant therapy 01/10/2019  . Port-A-Cath in place 12/20/2018  . Large cell carcinoma of left lung, stage 4 ([place]) 10/18/2018  . Encounter for antineoplastic immunotherapy 10/18/2018  . Bone metastases ([place]) 10/10/2018  . Bronchiectasis without complication ([place]) 65/68/1275  . COPD GOLD ? II  Still smoking 09/12/2018  . Cigarette smoker 09/12/2018  . Pulmonary nodule 09/11/2018  . Left-sided back pain 08/06/2018  . Rib pain on left side 08/06/2018  . Acute thoracic back pain 07/05/2018  . UTI symptoms 03/17/2018  . Closed nondisplaced fracture of lateral malleolus of left fibula 03/06/2018  . Pain in left ankle and joints of left foot 02/13/2018  . Hyperlipidemia 01/10/2018  . Routine general medical examination at a  health care facility 01/02/2015  . Nephrolithiasis 08/11/2013    Past Surgical History:  Procedure Laterality Date  . CYSTOSCOPY WITH RETROGRADE PYELOGRAM, URETEROSCOPY AND STENT PLACEMENT Left 12/28/2017   Procedure: CYSTOSCOPY WITH RETROGRADE PYELOGRAM, URETEROSCOPY AND STENT PLACEMENT, STONE BASKETRY;  Surgeon: Festus Aloe, MD;  Location: [facility];  Service: Urology;  Laterality: Left;  . FINGER SURGERY     index, growth removal  . HOLMIUM LASER APPLICATION Left 2/53/6644   Procedure: HOLMIUM LASER APPLICATION;  Surgeon: Festus Aloe, MD;  Location: [facility];  Service: Urology;   Laterality: Left;  . IR IMAGING GUIDED PORT INSERTION  10/31/2018  . KIDNEY STONE SURGERY  2011 or 2012  . LITHOTRIPSY  2007     OB History   No obstetric history on file.      Home Medications    Prior to Admission medications   Medication Sig Start Date End Date Taking? Authorizing Provider  ALPRAZolam (XANAX) 0.25 MG tablet Take 1 tablet (0.25 mg total) by mouth 2 (two) times daily as needed for anxiety. 05/22/19   Curt Bears, MD  bisacodyl (BISACODYL) 5 MG EC tablet Take 5 mg by mouth daily as needed for moderate constipation.    [provider]  folic acid (FOLVITE) 1 MG tablet Take 1 tablet (1 mg total) by mouth daily. Patient taking differently: Take 1 mg by mouth every evening.  12/10/18   Curt Bears, MD  gabapentin (NEURONTIN) 300 MG capsule Take 1 capsule (300 mg total) by mouth 3 (three) times daily as needed (pain). 05/20/19   [name], MD  Hydrocortisone (GERHARDT'S BUTT CREAM) CREA Apply 1 application topically daily. 04/19/19   Nita Sells, MD  hydrOXYzine (ATARAX/VISTARIL) 25 MG tablet Take 1 tablet (25 mg total) by mouth 3 (three) times daily as needed for nausea (use Zofran 1st). 04/19/19   Nita Sells, MD  lidocaine-prilocaine (EMLA) cream Apply 1 application topically as needed. Patient taking differently: Apply 1 application topically as needed (For port-a-cath.).  10/18/18   Curt Bears, MD  magic mouthwash SOLN Take 5 mLs by mouth 4 (four) times daily as needed for mouth pain. Components - Hydrocortisone 60 mg, Nystatin suspension 30 ml , Benadryl 12.5mg  /5 ml QS 240 ml. 04/05/19   Curt Bears, MD  magnesium oxide (MAG-OX) 400 (241.3 Mg) MG tablet Take 1 tablet (400 mg total) by mouth 2 (two) times daily. Patient not taking: Reported on 06/03/2019 04/23/19   Curt Bears, MD  Melatonin 10 MG TABS Take 10 mg by mouth at bedtime.     [provider]  Multiple Vitamin (MULTIVITAMIN WITH MINERALS) TABS tablet Take 1  tablet by mouth daily. 05/06/19   Raiford Noble Latif, DO  omeprazole (PRILOSEC) 40 MG capsule TAKE 1 CAPSULE(40 MG) BY MOUTH DAILY 05/21/19   Curt Bears, MD  ondansetron (ZOFRAN) 4 MG tablet Take 1 tablet (4 mg total) by mouth 3 (three) times daily as needed for nausea or vomiting. 05/23/19   Irene Shipper, MD  oxyCODONE (OXY IR/ROXICODONE) 5 MG immediate release tablet Take 1-2 tablets (5-10 mg total) by mouth every 4 (four) hours as needed for severe pain. 06/03/19   [name], MD  OxyCODONE HCl, Abuse Deter, (OXAYDO) 5 MG TABA  04/30/19   [provider]  potassium chloride (K-DUR) 10 MEQ tablet Take 1 tablet (10 mEq total) by mouth 2 (two) times daily. Patient not taking: Reported on 06/03/2019 04/19/19   Nita Sells, MD  potassium chloride SA (K-DUR)  20 MEQ tablet Take 1 tablet (20 mEq total) by mouth 2 (two) times daily. 06/03/19   Curt Bears, MD  prochlorperazine (COMPAZINE) 10 MG tablet Take 1 tablet (10 mg total) by mouth every 6 (six) hours as needed for nausea or vomiting. 05/06/19   Raiford Noble Latif, DO  warfarin (COUMADIN) 5 MG tablet TAKE 1 TABLET(5 MG) BY MOUTH DAILY 05/08/19   Kerney Elbe, DO    Family History Family History  Problem Relation Age of Onset  . Cancer Father        pancreatic  . Melanoma Father     Social History Social History   Tobacco Use  . Smoking status: Former Smoker    Packs/day: 0.25    Years: 30.00    Pack years: 7.50    Types: Cigarettes  . Smokeless tobacco: Never Used  . Tobacco comment: She quit early December 2019  Substance Use Topics  . Alcohol use: Yes    Comment: occ  . Drug use: No     Allergies   Barium sulfate   Review of Systems Review of Systems Ten systems reviewed and are negative for acute change, except as noted in the HPI.    Physical Exam Updated Vital Signs BP 116/77 (BP Location: Right Arm)   Pulse 73   Temp 98.6 F (37 C) (Oral)   Resp 20   Ht 5\' 6"  (1.676 m)   Wt 59  kg   SpO2 100%   BMI 20.98 kg/m   Physical Exam Vitals signs and nursing note reviewed.  Constitutional:      General: She is not in acute distress.    Appearance: She is well-developed. She is not diaphoretic.  HENT:     Head: Normocephalic and atraumatic.  Eyes:     General: No scleral icterus.    Conjunctiva/sclera: Conjunctivae normal.  Neck:     Musculoskeletal: Normal range of motion.  Cardiovascular:     Rate and Rhythm: Normal rate and regular rhythm.     Heart sounds: Normal heart sounds. No murmur. No friction rub. No gallop.   Pulmonary:     Effort: Pulmonary effort is normal. No respiratory distress.     Breath sounds: Normal breath sounds.  Abdominal:     General: Bowel sounds are normal. There is no distension.     Palpations: Abdomen is soft. There is no mass.     Tenderness: There is no abdominal tenderness. There is no guarding.  Musculoskeletal:     Comments: Bilateral grip strength equal.  Normal strength in the bilateral upper extremities. Sensation and pulses intact   Skin:    General: Skin is warm and dry.  Neurological:     Mental Status: She is alert and oriented to person, place, and time.  Psychiatric:        Behavior: Behavior normal.      ED Treatments / Results  Labs (all labs ordered are listed, but only abnormal results are displayed) Labs Reviewed  BASIC METABOLIC PANEL  CBC WITH DIFFERENTIAL/PLATELET    EKG None  Radiology No results found.  Procedures .Critical Care Performed by: Margarita Mail, PA-C Authorized by: Margarita Mail, PA-C   Critical care provider statement:    Critical care time (minutes):  30   Critical care time was exclusive of:  Separately billable procedures and treating other patients   Critical care was time spent personally by me on the following activities:  Discussions with consultants, evaluation of patient's response to  treatment, examination of patient, ordering and performing treatments and  interventions, ordering and review of laboratory studies, ordering and review of radiographic studies, pulse oximetry, re-evaluation of patient's condition, obtaining history from patient or surrogate and review of old charts   (including critical care time)  Medications Ordered in ED Medications  HYDROmorphone (DILAUDID) injection 0.5 mg (has no administration in time range)  ondansetron (ZOFRAN) injection 4 mg (has no administration in time range)     Initial Impression / Assessment and Plan / ED Course  I have reviewed the triage vital signs and the nursing notes.  Pertinent labs & imaging results that were available during my care of the patient were reviewed by me and considered in my medical decision making (see chart for details).  Clinical Course as of Jul 11 1720  Mon Jun 17, 2019  1726 Patient son updated.   [MP]  1923 consult: Reviewed with Dr. Marin Olp.  He advises to give 20 mg of Decadron IV and then, if patient is pain controlled for discharge, 10 mg daily of Decadron orally with close follow-up with Dr. Earlie Server.  I have also called Dr. Delanna Ahmadi phone number and left a message for call back.   [MP]  2032 Consult: Reviewed with Dr. Hilma Favors.  She agrees with the plan of Decadron as outlined by Dr. Marin Olp and discharging with Dilaudid.  She will have palliative care nursing out tomorrow to assess the patient's response to treatment and continue with pain management.   [MP]    Clinical Course User Index [MP] Charlesetta Shanks, MD       Patient here with severe left upper extremity pain. MRI is pending.  Her physician called to have her admitted for pain control.  I have given signout to Dr. Vallery Ridge who will assume care of the patient.   I spoke with Dr. Hilma Favors of palliative care medicine who sent the patient in for evaluation.  She was contacted by the patient's palliative care nurse due to her severe pain and felt like if she could come to the ER she could go ahead and  get pain control started prior to admission.  She recommends PCA pump with Dilaudid.  Will obtain MRI for expedited outpatient care with her interventional pain management specialist.  I have given signout to Dr. Vallery Ridge who will assume care.  Final Clinical Impressions(s) / ED Diagnoses   Final diagnoses:  None    ED Discharge Orders    None       Margarita Mail, PA-C 06/17/19 1650    Tegeler, Gwenyth Allegra, MD 06/17/19 1652    Margarita Mail, PA-C 07/12/19 1722    Tegeler, Gwenyth Allegra, MD 07/12/19 [phone]

## 2019-06-17 NOTE — Progress Notes (Addendum)
Call received from [facility] Provider requesting direct admission for uncontrolled cancer related pain. Patient is s/p XRT for metastatic lung cancer, now having 10/10 neuropathic pain radiating down her arm. She has taken large doses of oxycodone without relief. She will need acute pain management and interventional pain consultation. Patient was in such significant pain she has opted to go directly to the ED for expedited treatment. I am available to consult in ED or during admission to assist with her pain and symptom management.  I have discussed her case in detail with Dr. Betti Cruz with the home based team.  Lane Hacker, [facility] [phone]

## 2019-06-17 NOTE — ED Triage Notes (Signed)
Pt states that she has pain radiating from her left neck, down her shoulder, and to her finger tips x 2 days.

## 2019-06-17 NOTE — ED Provider Notes (Signed)
Radicular pain. Has pain meds but uses infrequently. Now out of meds. Gets injections for pain control, sent by pain management doctor for admission. Physical Exam  BP 116/77 (BP Location: Right Arm)   Pulse 73   Temp 98.6 F (37 C) (Oral)   Resp 20   Ht 5\' 6"  (1.676 m)   Wt 59 kg   SpO2 100%   BMI 20.98 kg/m   Physical Exam  ED Course/Procedures   Clinical Course as of Jun 16 2032  [facility] Jun 17, 2019  1726 Patient son updated.   [MP]  1923 consult: Reviewed with Dr. Marin Olp.  He advises to give 20 mg of Decadron IV and then, if patient is pain controlled for discharge, 10 mg daily of Decadron orally with close follow-up with Dr. Earlie Server.  I have also called Dr. Delanna Ahmadi phone number and left a message for call back.   [MP]  2032 Consult: Reviewed with Dr. Hilma Favors.  She agrees with the plan of Decadron as outlined by Dr. Marin Olp and discharging with Dilaudid.  She will have palliative care nursing out tomorrow to assess the patient's response to treatment and continue with pain management.   [MP]    Clinical Course User Index [MP] Charlesetta Shanks, MD    Procedures  MDM  MRI pending.   I have reviewed results with the patient.  She is alert and appropriate.  She does report much improved pain control with IV Dilaudid.  She was given 1/2 mg dose which was helpful.  She was also given a 1 mg dose which she reported made her adequately comfortable.  She is moving about and conversing without difficulty.  She is not oversedated.  She is not expressing any ongoing pain.  We had a long conversation regarding pain management and the use of Decadron per my conversations with Dr. Marin Olp.  Patient's preference is to try management of pain at home.  She does have good follow-up and also in-home assessment with palliative care for pain control.  Patient will return if any problems or concerns arise.       Charlesetta Shanks, MD 06/17/19 2034

## 2019-06-17 NOTE — ED Notes (Signed)
Patient remains in MRI 

## 2019-06-17 NOTE — ED Notes (Signed)
Patient transported to MRI 

## 2019-06-17 NOTE — ED Notes (Signed)
Critical Lab notification from LAB. Potassium 2.7. Di Kindle, RN notified.

## 2019-06-17 NOTE — ED Notes (Signed)
Patient still in MRI.  

## 2019-06-18 ENCOUNTER — Other Ambulatory Visit: Payer: Self-pay | Admitting: Internal Medicine

## 2019-06-18 ENCOUNTER — Telehealth: Payer: Self-pay | Admitting: Medical Oncology

## 2019-06-18 DIAGNOSIS — Z515 Encounter for palliative care: Secondary | ICD-10-CM | POA: Insufficient documentation

## 2019-06-18 MED ORDER — HYDROMORPHONE HCL 4 MG PO TABS: 4.0000 mg | ORAL_TABLET | ORAL | 0 refills | Status: DC | PRN

## 2019-06-18 MED ORDER — HYDROMORPHONE HCL 4 MG PO TABS: 4.0000 mg | ORAL_TABLET | ORAL | 0 refills | Status: AC | PRN

## 2019-06-18 NOTE — Telephone Encounter (Signed)
Pain management- Dr Hilma Favors sent pt to ED for elevated pain left arm Alison Sullivan. MR cervical spine> metastatic disease . Received iv decadron , dilaudid and oral decadron then d/c home.  I recommended Tammy f/u with Dr Hilma Favors for pt ongoing pain management. While we were on the phone Dr Hilma Favors was calling through to [facility] so I hung up. CT scan scheduled 8/7.

## 2019-06-18 NOTE — Telephone Encounter (Signed)
Seen in ED for new neck, arm ,shoulder neuropathic  pain. MR c-spine> metastatic disease.   I updated son on Dr Cornelia Copa new pain management plan and that she was f/u with Dr Lovenia Shuck .  Son asking " Is xrt an option to treat the spine mets?"

## 2019-06-18 NOTE — Telephone Encounter (Addendum)
Pain better controlled -pt pain is improved with new dilaudid dose. Tammy spoke frankly to pt and son that she should not be alone due to nurse concerns about her cognitive ability. Pts ex-husband is staying with her tonight.  ?refer to radiation-Mohamed to advise.

## 2019-06-18 NOTE — Progress Notes (Addendum)
Post-ED follow up. Patient was unable to get prescription filled because pharmacy was closed. I have sent appropriate equalanalgesic dose to pharmacy and cancelled ED opioid prescription. Hydromorphone 4mg  q2 prn, dispensed 180, will monitor prn doses and convert her to long-acting opioid based on usage. Palliative RN to see her at home this afternoon. Continue decadron 4mg  PO BID.  MRI Findings:  Metastatic disease affecting the left side of the C7 vertebral body, left pedicle, left posterior elements and base of the spinous process. Extraosseous tumor in the ventral epidural space, left side of the spinal canal and intervertebral foramina on the left at C6-7 and C7-T1. Tumor extends outward in the soft tissues towards the brachial plexus.  Will forward results to Dr. Maryjean Ka.  Lane Hacker, DO Palliative Medicine   Notified [facility] at [address] did not have hydromorphone-script reordered and sent to [facility] on [address].

## 2019-06-18 NOTE — Addendum Note (Signed)
Addended by: Acquanetta Chain on: 06/18/2019 07:03 PM   Modules accepted: Orders

## 2019-06-19 ENCOUNTER — Other Ambulatory Visit: Payer: Self-pay | Admitting: Internal Medicine

## 2019-06-19 ENCOUNTER — Other Ambulatory Visit: Payer: Self-pay | Admitting: *Deleted

## 2019-06-19 DIAGNOSIS — E876 Hypokalemia: Secondary | ICD-10-CM

## 2019-06-19 MED ORDER — POTASSIUM CHLORIDE CRYS ER 20 MEQ PO TBCR: 20.0000 meq | EXTENDED_RELEASE_TABLET | Freq: Two times a day (BID) | ORAL | 0 refills | Status: DC

## 2019-06-19 NOTE — Telephone Encounter (Signed)
Received call from patient requesting refill of her KCL. She was in ED recently with K+ 2.7.  She did not want to run out.  Refilled 20 meg KCL BID per most recent prescription to [facility] on [address]

## 2019-06-20 ENCOUNTER — Telehealth: Payer: Self-pay | Admitting: *Deleted

## 2019-06-20 ENCOUNTER — Other Ambulatory Visit: Payer: Self-pay | Admitting: Internal Medicine

## 2019-06-20 MED ORDER — FENTANYL 50 MCG/HR TD PT72: 1.0000 | MEDICATED_PATCH | TRANSDERMAL | 0 refills | Status: AC

## 2019-06-20 NOTE — Telephone Encounter (Signed)
Received call from Harper, RN from [facility]. Palliative care services.  She is calling to provide update on pt's pain control.  Pt has been using Dilaudid 4 mg-2 tabs approx.4 x a day with fair relief. Dr. Hilma Favors with Palliative is ordering Fentanyl Patch 50 mcg today and pt will continue to use Dilaudid for breakthrough pain meidicne.Lynelle Smoke is aware of scans being done tomorrow and f/u appt with Dr. Julien Nordmann on 06/25/19 and Dr. Lanell Persons on 07/05/19.

## 2019-06-20 NOTE — Telephone Encounter (Signed)
Received call from patient's son, Shea Stakes.  He is inquiring about the plans to address pt's new tumor in her cervical vertebrae.  Informed son that pt has an appt with Dr. Lanell Persons on 07/05/2019 to evaluate possibility of radiation to that site.  He voiced understanding. He was also asking 'big picture' questions related to overall prognosis, treatment plans etc.  Advised that pt has CT scans for chest, abd, pelvis scheduled for tomorrow and then an appt with Dr. Julien Nordmann on Monday, 06/24/19 to review scans and discuss treatment plans.  Advised that he could join that appt via phone to listen the results, ask questions etc.  He voiced understanding.

## 2019-06-20 NOTE — Progress Notes (Signed)
Patient has taken 32mg  of Hydromorphone in the past 24 hours with pain score around 5-6. Will convert her to a 50 mcg fentanyl patch for basal pain control and she can continue to use the hydromorphone for breakthrough pain. Discussed with Tammy, Palliative RN- she will follow up with patient tomorrow.  Lane Hacker, DO Palliative Medicine

## 2019-06-21 ENCOUNTER — Other Ambulatory Visit: Payer: Self-pay

## 2019-06-21 ENCOUNTER — Ambulatory Visit (HOSPITAL_COMMUNITY)
Admission: RE | Admit: 2019-06-21 | Discharge: 2019-06-21 | Disposition: A | Discharge status: Home / Self Care | Payer: Medicare Other | Source: Ambulatory Visit | Attending: Internal Medicine | Admitting: Internal Medicine

## 2019-06-21 ENCOUNTER — Other Ambulatory Visit: Payer: Self-pay | Admitting: *Deleted

## 2019-06-21 ENCOUNTER — Encounter (HOSPITAL_COMMUNITY): Payer: Self-pay

## 2019-06-21 DIAGNOSIS — C3490 Malignant neoplasm of unspecified part of unspecified bronchus or lung: Secondary | ICD-10-CM | POA: Insufficient documentation

## 2019-06-21 DIAGNOSIS — C787 Secondary malignant neoplasm of liver and intrahepatic bile duct: Secondary | ICD-10-CM | POA: Diagnosis not present

## 2019-06-21 DIAGNOSIS — C7A8 Other malignant neuroendocrine tumors: Secondary | ICD-10-CM

## 2019-06-21 DIAGNOSIS — C7800 Secondary malignant neoplasm of unspecified lung: Secondary | ICD-10-CM | POA: Diagnosis not present

## 2019-06-21 MED ORDER — HEPARIN SOD (PORK) LOCK FLUSH 100 UNIT/ML IV SOLN
500.0000 [IU] | Freq: Once | INTRAVENOUS | Status: AC
  Administered 2019-06-21: 14:00:00 500 [IU] via INTRAVENOUS

## 2019-06-21 MED ORDER — SODIUM CHLORIDE (PF) 0.9 % IJ SOLN
INTRAMUSCULAR | Status: AC
  Filled 2019-06-21: qty 50

## 2019-06-21 MED ORDER — HEPARIN SOD (PORK) LOCK FLUSH 100 UNIT/ML IV SOLN
INTRAVENOUS | Status: AC
  Administered 2019-06-21: 500 [IU] via INTRAVENOUS
  Filled 2019-06-21: qty 5

## 2019-06-21 MED ORDER — IOHEXOL 300 MG/ML  SOLN
75.0000 mL | Freq: Once | INTRAMUSCULAR | Status: AC | PRN
  Administered 2019-06-21: 75 mL via INTRAVENOUS

## 2019-06-24 ENCOUNTER — Other Ambulatory Visit: Payer: Self-pay

## 2019-06-24 ENCOUNTER — Inpatient Hospital Stay: Payer: Medicare Other

## 2019-06-24 ENCOUNTER — Encounter: Payer: Self-pay | Admitting: Internal Medicine

## 2019-06-24 ENCOUNTER — Inpatient Hospital Stay: Payer: Medicare Other | Attending: Internal Medicine | Admitting: Internal Medicine

## 2019-06-24 VITALS — BP 106/71 | HR 86 | Temp 98.5°F | Resp 18 | Ht 66.0 in | Wt 129.1 lb

## 2019-06-24 DIAGNOSIS — Z923 Personal history of irradiation: Secondary | ICD-10-CM | POA: Insufficient documentation

## 2019-06-24 DIAGNOSIS — N200 Calculus of kidney: Secondary | ICD-10-CM | POA: Insufficient documentation

## 2019-06-24 DIAGNOSIS — C3492 Malignant neoplasm of unspecified part of left bronchus or lung: Secondary | ICD-10-CM

## 2019-06-24 DIAGNOSIS — R222 Localized swelling, mass and lump, trunk: Secondary | ICD-10-CM | POA: Diagnosis not present

## 2019-06-24 DIAGNOSIS — R0609 Other forms of dyspnea: Secondary | ICD-10-CM | POA: Insufficient documentation

## 2019-06-24 DIAGNOSIS — I8290 Acute embolism and thrombosis of unspecified vein: Secondary | ICD-10-CM | POA: Diagnosis not present

## 2019-06-24 DIAGNOSIS — M542 Cervicalgia: Secondary | ICD-10-CM | POA: Diagnosis not present

## 2019-06-24 DIAGNOSIS — C3490 Malignant neoplasm of unspecified part of unspecified bronchus or lung: Secondary | ICD-10-CM | POA: Diagnosis not present

## 2019-06-24 DIAGNOSIS — M8580 Other specified disorders of bone density and structure, unspecified site: Secondary | ICD-10-CM | POA: Diagnosis not present

## 2019-06-24 DIAGNOSIS — M1990 Unspecified osteoarthritis, unspecified site: Secondary | ICD-10-CM | POA: Diagnosis not present

## 2019-06-24 DIAGNOSIS — R531 Weakness: Secondary | ICD-10-CM | POA: Insufficient documentation

## 2019-06-24 DIAGNOSIS — R5383 Other fatigue: Secondary | ICD-10-CM | POA: Diagnosis not present

## 2019-06-24 DIAGNOSIS — J90 Pleural effusion, not elsewhere classified: Secondary | ICD-10-CM | POA: Insufficient documentation

## 2019-06-24 DIAGNOSIS — Z79899 Other long term (current) drug therapy: Secondary | ICD-10-CM | POA: Diagnosis not present

## 2019-06-24 DIAGNOSIS — M898X9 Other specified disorders of bone, unspecified site: Secondary | ICD-10-CM | POA: Insufficient documentation

## 2019-06-24 DIAGNOSIS — C7A1 Malignant poorly differentiated neuroendocrine tumors: Secondary | ICD-10-CM | POA: Insufficient documentation

## 2019-06-24 DIAGNOSIS — M79602 Pain in left arm: Secondary | ICD-10-CM | POA: Diagnosis not present

## 2019-06-24 DIAGNOSIS — R5382 Chronic fatigue, unspecified: Secondary | ICD-10-CM

## 2019-06-24 DIAGNOSIS — F419 Anxiety disorder, unspecified: Secondary | ICD-10-CM | POA: Insufficient documentation

## 2019-06-24 DIAGNOSIS — C7A8 Other malignant neuroendocrine tumors: Secondary | ICD-10-CM

## 2019-06-24 DIAGNOSIS — I513 Intracardiac thrombosis, not elsewhere classified: Secondary | ICD-10-CM | POA: Diagnosis not present

## 2019-06-24 DIAGNOSIS — Z7901 Long term (current) use of anticoagulants: Secondary | ICD-10-CM | POA: Insufficient documentation

## 2019-06-24 DIAGNOSIS — C7951 Secondary malignant neoplasm of bone: Secondary | ICD-10-CM

## 2019-06-24 DIAGNOSIS — Z87442 Personal history of urinary calculi: Secondary | ICD-10-CM | POA: Diagnosis not present

## 2019-06-24 DIAGNOSIS — M25512 Pain in left shoulder: Secondary | ICD-10-CM | POA: Diagnosis not present

## 2019-06-24 DIAGNOSIS — C787 Secondary malignant neoplasm of liver and intrahepatic bile duct: Secondary | ICD-10-CM | POA: Insufficient documentation

## 2019-06-24 DIAGNOSIS — R29890 Loss of height: Secondary | ICD-10-CM | POA: Diagnosis not present

## 2019-06-24 LAB — CBC WITH DIFFERENTIAL (CANCER CENTER ONLY)
Abs Immature Granulocytes: 0.05 10*3/uL (ref 0.00–0.07)
Basophils Absolute: 0 10*3/uL (ref 0.0–0.1)
Basophils Relative: 0 %
Eosinophils Absolute: 0.1 10*3/uL (ref 0.0–0.5)
Eosinophils Relative: 1 %
HCT: 37.1 % (ref 36.0–46.0)
Hemoglobin: 12.3 g/dL (ref 12.0–15.0)
Immature Granulocytes: 1 %
Lymphocytes Relative: 10 %
Lymphs Abs: 0.9 10*3/uL (ref 0.7–4.0)
MCH: 34.4 pg — ABNORMAL HIGH (ref 26.0–34.0)
MCHC: 33.2 g/dL (ref 30.0–36.0)
MCV: 103.6 fL — ABNORMAL HIGH (ref 80.0–100.0)
Monocytes Absolute: 0.6 10*3/uL (ref 0.1–1.0)
Monocytes Relative: 8 %
Neutro Abs: 6.7 10*3/uL (ref 1.7–7.7)
Neutrophils Relative %: 80 %
Platelet Count: 173 10*3/uL (ref 150–400)
RBC: 3.58 MIL/uL — ABNORMAL LOW (ref 3.87–5.11)
RDW: 14.8 % (ref 11.5–15.5)
WBC Count: 8.4 10*3/uL (ref 4.0–10.5)
nRBC: 0 % (ref 0.0–0.2)

## 2019-06-24 LAB — CMP (CANCER CENTER ONLY)
ALT: 17 U/L (ref 0–44)
AST: 27 U/L (ref 15–41)
Albumin: 3.2 g/dL — ABNORMAL LOW (ref 3.5–5.0)
Alkaline Phosphatase: 88 U/L (ref 38–126)
Anion gap: 13 (ref 5–15)
BUN: 20 mg/dL (ref 8–23)
CO2: 21 mmol/L — ABNORMAL LOW (ref 22–32)
Calcium: 8.9 mg/dL (ref 8.9–10.3)
Chloride: 104 mmol/L (ref 98–111)
Creatinine: 0.98 mg/dL (ref 0.44–1.00)
GFR, Est AFR Am: >60 mL/min (ref >60)
GFR, Estimated: 59 mL/min — ABNORMAL LOW (ref >60)
Glucose, Bld: 103 mg/dL — ABNORMAL HIGH (ref 70–99)
Potassium: 3.8 mmol/L (ref 3.5–5.1)
Sodium: 138 mmol/L (ref 135–145)
Total Bilirubin: 0.3 mg/dL (ref 0.3–1.2)
Total Protein: 6.9 g/dL (ref 6.5–8.1)

## 2019-06-24 LAB — TSH: TSH: 0.1 u[IU]/mL — ABNORMAL LOW (ref 0.308–3.960)

## 2019-06-24 NOTE — Progress Notes (Signed)
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  She presented with left upper lobe lung nodule in addition to mediastinal lymphadenopathy as well as right upper lobe as well as metastatic bone disease to the sixth and 10th rib.  The patient has significant pain from her metastatic rib lesions.  Molecular studies by [employer] showed no actionable mutations.  PRIOR THERAPY:  1) systemic chemotherapy with carboplatin for AUC of 5 on day 1, etoposide 100 mg/M2 on days 1, 2 and 3 as well as Tecentriq 1200 mg IV every 3 weeks with Neulasta support.  Status post 2 cycles, discontinued secondary to disease progression. 2) palliative radiotherapy to progressive T10 vertebral body bone lesion under the care of Dr. Isidore Moos 3) Systemic chemotherapy with carboplatin for AUC of 5, Alimta 500 mg/M2 and Keytruda 200 mg IV every 3 weeks.  First dose December 17, 2018.  Status post 9 cycles.  Starting cycle #5 the patient is currently on maintenance treatment with Alimta and Keytruda.  Starting from cycle #7 she was on single agent Keytruda.  Last dose was given 06/03/2019 discontinued secondary to disease progression.  CURRENT THERAPY: None  INTERVAL HISTORY: Alison Sullivan 68 y.o. female returns to the clinic today for follow-up visit.  Her son Shea Stakes was available by phone during the visit.  The patient is complaining of increasing fatigue and weakness as well as pain in the neck area with radiation to the left arm.  She had MRI of the cervical spine performed recently that showed metastatic disease affecting the left side of the C7 vertebral body, left pedicle and left posterior elements as well as the base of the spinous process.  There was extraosseous tumor in the ventral  epidural space, left side of the spinal canal and intervertebral foramina on the left at C6-7 and C7-T1.  There was also tumor extending outward in the soft tissue towards the brachial plexus.  The patient was seen by Dr. Lovenia Shuck at the pain clinic and she is currently on Dilaudid 4 mg every 2 hours as needed for pain.  She is also on gabapentin 300 mg p.o. 3 times daily.  She has been on treatment with fentanyl patch 50 mcg/hour every 3 days.  The patient denied having any current nausea, vomiting, diarrhea or constipation.  She noticed a subcutaneous pump on the frontal part of her scalp started few days ago.  The patient thought that she hit the floor several days before.  She had repeat CT scan of the chest, abdomen pelvis performed recently and she is here for evaluation and discussion of her scan results.  MEDICAL HISTORY: Past Medical History:  Diagnosis Date   Arthritis    Chronic kidney disease    Dog bite of wrist    History of kidney stones    History of radiation therapy 10/23/18- 11/06/18   Left lower rib mass/ 30 Gy delivered in 10 fractions 3 Gy.    Kidney infection    Left renal mass    Lung cancer (HCC)    Metastatic bone cancer (HCC)    Osteopenia    Pyelonephritis    Recurrent UTI (urinary tract infection)     ALLERGIES:  is allergic to barium sulfate.  MEDICATIONS:  Current Outpatient Medications  Medication Sig Dispense Refill   ALPRAZolam (XANAX) 0.25 MG tablet Take 1 tablet (0.25 mg total) by mouth 2 (two) times daily as needed for anxiety. 30 tablet 0   bisacodyl (BISACODYL) 5 MG EC tablet Take 5 mg by mouth daily as needed for moderate constipation.     dexamethasone (DECADRON) 4 MG tablet Take 1 tablet (4 mg total) by mouth 2 (two) times daily with a meal. 12 tablet 0   fentaNYL (DURAGESIC) 50 MCG/HR Place 1 patch onto the skin every 3 (three) days. 10 patch 0   folic acid (FOLVITE) 1 MG tablet Take 1 tablet (1 mg total) by mouth daily. (Patient  taking differently: Take 1 mg by mouth every evening. ) 30 tablet 4   gabapentin (NEURONTIN) 300 MG capsule Take 1 capsule (300 mg total) by mouth 3 (three) times daily as needed (pain). 90 capsule 2   Hydrocortisone (GERHARDT'S BUTT CREAM) CREA Apply 1 application topically daily. 1 each 0   HYDROmorphone (DILAUDID) 4 MG tablet Take 1 tablet (4 mg total) by mouth every 2 (two) hours as needed for severe pain. 180 tablet 0   hydrOXYzine (ATARAX/VISTARIL) 25 MG tablet Take 1 tablet (25 mg total) by mouth 3 (three) times daily as needed for nausea (use Zofran 1st). 30 tablet 0   lidocaine-prilocaine (EMLA) cream Apply 1 application topically as needed. (Patient taking differently: Apply 1 application topically as needed (For port-a-cath.). ) 30 g 0   magic mouthwash SOLN Take 5 mLs by mouth 4 (four) times daily as needed for mouth pain. Components - Hydrocortisone 60 mg, Nystatin suspension 30 ml , Benadryl 12.5mg  /5 ml QS 240 ml. 240 mL 1   magnesium oxide (MAG-OX) 400 (241.3 Mg) MG tablet Take 1 tablet (400 mg total) by mouth 2 (two) times daily. 60 tablet 0   Melatonin 10 MG TABS Take 10 mg by mouth at bedtime.      Multiple Vitamin (MULTIVITAMIN WITH MINERALS) TABS tablet Take 1 tablet by mouth daily. 30 tablet 0   omeprazole (PRILOSEC) 40 MG capsule TAKE 1 CAPSULE(40 MG) BY MOUTH DAILY (Patient taking differently: Take 40 mg by mouth daily. ) 30 capsule 2   ondansetron (ZOFRAN) 4 MG tablet Take 1 tablet (4 mg total) by mouth 3 (three) times daily as needed for nausea or vomiting. 90 tablet 3   oxyCODONE (OXY IR/ROXICODONE) 5 MG immediate release tablet Take 1-2 tablets (5-10 mg total) by mouth every 4 (four) hours as needed for severe pain. 90 tablet 0   oxyCODONE (ROXICODONE) 5 MG immediate release tablet Take 1 tablet (5 mg total) by mouth every 4 (four) hours as needed for severe pain. 15 tablet 0   potassium chloride SA (K-DUR) 20 MEQ tablet TAKE 1 TABLET(20 MEQ) BY MOUTH TWICE  DAILY 30 tablet 0   prochlorperazine (COMPAZINE) 10 MG tablet Take 1 tablet (10 mg total) by mouth every 6 (six) hours as needed for nausea or vomiting. 30 tablet 0   warfarin (COUMADIN) 5 MG tablet TAKE 1 TABLET(5 MG) BY MOUTH DAILY (Patient taking differently: Take 5 mg by mouth daily at 6 PM. ) 90 tablet 1   No current facility-administered medications for this visit.     SURGICAL HISTORY:  Past Surgical History:  Procedure Laterality Date   CYSTOSCOPY WITH RETROGRADE PYELOGRAM, URETEROSCOPY AND STENT PLACEMENT Left 12/28/2017   Procedure: CYSTOSCOPY WITH RETROGRADE PYELOGRAM, URETEROSCOPY AND STENT PLACEMENT, STONE BASKETRY;  Surgeon: Festus Aloe, MD;  Location: [facility];  Service: Urology;  Laterality: Left;   FINGER SURGERY     index, growth removal   HOLMIUM LASER APPLICATION Left 03/08/9562   Procedure: HOLMIUM LASER APPLICATION;  Surgeon: Festus Aloe, MD;  Location: [facility];  Service: Urology;  Laterality: Left;   IR IMAGING GUIDED PORT INSERTION  10/31/2018   KIDNEY STONE SURGERY  2011 or 2012   LITHOTRIPSY  2007    REVIEW OF SYSTEMS:  Constitutional: positive for fatigue and weight loss Eyes: negative Ears, nose, mouth, throat, and face: negative Respiratory: positive for dyspnea on exertion Cardiovascular: negative Gastrointestinal: negative Genitourinary:negative Integument/breast: negative Hematologic/lymphatic: negative Musculoskeletal:positive for bone pain and neck pain Neurological: negative Behavioral/Psych: negative Endocrine: negative Allergic/Immunologic: negative   PHYSICAL EXAMINATION: General appearance: alert, cooperative, fatigued and no distress Head: Normocephalic, without obvious abnormality, atraumatic Neck: no adenopathy, no JVD, supple, symmetrical, trachea midline and thyroid not enlarged, symmetric, no tenderness/mass/nodules Lymph nodes: Cervical, supraclavicular, and axillary nodes  normal. Resp: clear to auscultation bilaterally Back: symmetric, no curvature. ROM normal. No CVA tenderness. Cardio: regular rate and rhythm, S1, S2 normal, no murmur, click, rub or gallop GI: soft, non-tender; bowel sounds normal; no masses,  no organomegaly Extremities: extremities normal, atraumatic, no cyanosis or edema Neurologic: Alert and oriented X 3, normal strength and tone. Normal symmetric reflexes. Normal coordination and gait  ECOG PERFORMANCE STATUS: 1 - Symptomatic but completely ambulatory  Blood pressure 106/71, pulse 86, temperature 98.5 F (36.9 C), temperature source Oral, resp. rate 18, height 5\' 6"  (1.676 m), weight 129 lb 1.6 oz (58.6 kg), SpO2 94 %.  LABORATORY DATA: Lab Results  Component Value Date   WBC 8.4 06/24/2019   HGB 12.3 06/24/2019   HCT 37.1 06/24/2019   MCV 103.6 (H) 06/24/2019   PLT 173 06/24/2019      Chemistry      Component Value Date/Time   NA 138 06/24/2019 1003   K 3.8 06/24/2019 1003   CL 104 06/24/2019 1003   CO2 21 (L) 06/24/2019 1003   BUN 20 06/24/2019 1003   CREATININE 0.98 06/24/2019 1003      Component Value Date/Time   CALCIUM 8.9 06/24/2019 1003   ALKPHOS 88 06/24/2019 1003   AST 27 06/24/2019 1003   ALT 17 06/24/2019 1003   BILITOT 0.3 06/24/2019 1003       RADIOGRAPHIC STUDIES: Ct Chest W Contrast  Result Date: 06/21/2019 CLINICAL DATA:  Non-small-cell lung cancer diagnosed in November 2019. Radiation therapy completed. Chemotherapy and immunotherapy in progress. Left shoulder pain. EXAM: CT CHEST, ABDOMEN, AND PELVIS WITH CONTRAST TECHNIQUE: Multidetector CT imaging of the chest, abdomen and pelvis was performed following the standard protocol during bolus administration of intravenous contrast. CONTRAST:  56mL OMNIPAQUE IOHEXOL 300 MG/ML  SOLN COMPARISON:  Prior CTs 04/11/2019.  MRI cervical spine 06/17/2019. FINDINGS: Cardiovascular: Atherosclerosis of the aorta, great vessels and coronary arteries. Right IJ  Port-A-Cath extends to the superior cavoatrial junction. No acute vascular findings. The heart size is normal. There is no pericardial effusion. Mediastinum/Nodes: Peripherally enhancing mass in the AP window has mildly enlarged, now measuring 4.8 x 2.4 cm on image 20/2. Previously 4.1 x 1.8 cm. The mass at the left cardiophrenic angle has also slightly enlarged, measuring 3.9 x 3.0 cm on image 50/2 (previously 3.5 x 2.6 cm). The thyroid gland, trachea and esophagus demonstrate no significant findings. Lungs/Pleura: There is a new small to moderate dependent left pleural effusion without pleural nodularity. There is no right-sided pleural effusion. Parenchymal scarring in the  right upper lobe has mildly improved. There is additional scarring in the superior segment of the right lower lobe. The dominant left upper lobe nodule has not significantly changed, measuring 1.7 cm on image 64/6. However, there are several new left upper lobe nodules which are suspicious for progressive metastatic disease, largest measuring 5 mm on image 27/6. Musculoskeletal/Chest wall: As seen on recent cervical MRI, there is a left paraspinal mass at C7, measuring 3.2 x 2.4 cm on image 1/2. There is associated osseous destruction, neural foraminal extension and probable involvement of the brachial plexus. There are progressive rib metastases involving the right 6th rib anteriorly and the left 10th rib posteriorly. The sclerotic metastasis involving the T10 vertebral body has also mildly progressed, and there is new paraspinal and epidural tumor at this level. A component lateral to the left T10-11 foramen measures up to 2.4 cm on image 46/2. The CSF surrounding the cord appears partially effaced (axial images 43 through 45 of series 2). CT ABDOMEN AND PELVIS FINDINGS Hepatobiliary: There are enlarging low-density hepatic lesions consistent with metastatic disease. Largest measure 1.5 cm in the left lobe on image 64/2 and 1.0 cm inferiorly  in the right lobe on image 65/2. No evidence of gallstones, gallbladder wall thickening or biliary dilatation. Pancreas: Unremarkable. No pancreatic ductal dilatation or surrounding inflammatory changes. Spleen: Normal in size without focal abnormality. Adrenals/Urinary Tract: Both adrenal glands appear normal. Nonobstructing bilateral renal calculi, cortical scarring and cystic lesions are grossly stable. No evidence of ureteral calculus or hydronephrosis. The bladder is nearly empty without apparent abnormality. Stomach/Bowel: No evidence of bowel wall thickening, distention or surrounding inflammatory change. Mild diverticular changes in the sigmoid colon. Vascular/Lymphatic: There are no enlarged abdominal or pelvic lymph nodes. Aortic and branch vessel atherosclerosis again noted with irregular mural thrombus in the abdominal aorta. No acute vascular findings. Reproductive: Stable 1 cm right adnexal cyst. The uterus appears normal. Other: No ascites or peritoneal nodularity. Musculoskeletal: No acute or significant osseous findings. IMPRESSION: 1. Significant progression in metastatic disease. In the chest, there are enlarging nodal masses in the AP window and adjacent to the left ventricular apex, enlarging left lung nodules and progressive osseous metastases. 2. Left C7 osseous metastasis with paraspinal extension as seen on recent cervical MRI. There is also progressive metastatic disease within the T10 vertebral body with associated paraspinal and epidural tumor which may compromise the thoracic cord. 3. New hepatic metastatic disease. 4. These results will be called to the ordering clinician or representative by the Radiologist Assistant, and communication documented in the PACS or zVision Dashboard. Electronically Signed   By: Richardean Sale M.D.   On: 06/21/2019 20:09   Mr Cervical Spine W Or Wo Contrast  Result Date: 06/17/2019 CLINICAL DATA:  Left neck pain radiating to the shoulder and fingers  over the last 2 days. History lung cancer EXAM: MRI CERVICAL SPINE WITHOUT AND WITH CONTRAST TECHNIQUE: Multiplanar and multiecho pulse sequences of the cervical spine, to include the craniocervical junction and cervicothoracic junction, were obtained without and with intravenous contrast. CONTRAST:  7 cc Gadavist COMPARISON:  None. FINDINGS: Alignment: Normal Vertebrae: Metastatic disease affecting the C7 level. Tumor is present within the vertebral body, left pedicle, left posterior elements and base of the spinous process. There is extraosseous tumor encroaching upon the ventral epidural space, the left-sided spinal canal and both the C6-7 and C7-T1 intervertebral foramina. No other regional metastasis is seen. There is old minimal loss of height at the superior endplates of T1  and T2 which look old or late subacute. Cord: No primary cord metastasis. Posterior Fossa, vertebral arteries, paraspinal tissues: Tumor extends out on the left towards the brachial plexus. No inferior brain lesion is seen. Disc levels: Disc bulge at C5-6, not significant. IMPRESSION: Metastatic disease affecting the left side of the C7 vertebral body, left pedicle, left posterior elements and base of the spinous process. Extraosseous tumor in the ventral epidural space, left side of the spinal canal and intervertebral foramina on the left at C6-7 and C7-T1. Tumor extends outward in the soft tissues towards the brachial plexus. Electronically Signed   By: Nelson Chimes M.D.   On: 06/17/2019 18:21   Ct Abdomen Pelvis W Contrast  Result Date: 06/21/2019 CLINICAL DATA:  Non-small-cell lung cancer diagnosed in November 2019. Radiation therapy completed. Chemotherapy and immunotherapy in progress. Left shoulder pain. EXAM: CT CHEST, ABDOMEN, AND PELVIS WITH CONTRAST TECHNIQUE: Multidetector CT imaging of the chest, abdomen and pelvis was performed following the standard protocol during bolus administration of intravenous contrast. CONTRAST:   62mL OMNIPAQUE IOHEXOL 300 MG/ML  SOLN COMPARISON:  Prior CTs 04/11/2019.  MRI cervical spine 06/17/2019. FINDINGS: Cardiovascular: Atherosclerosis of the aorta, great vessels and coronary arteries. Right IJ Port-A-Cath extends to the superior cavoatrial junction. No acute vascular findings. The heart size is normal. There is no pericardial effusion. Mediastinum/Nodes: Peripherally enhancing mass in the AP window has mildly enlarged, now measuring 4.8 x 2.4 cm on image 20/2. Previously 4.1 x 1.8 cm. The mass at the left cardiophrenic angle has also slightly enlarged, measuring 3.9 x 3.0 cm on image 50/2 (previously 3.5 x 2.6 cm). The thyroid gland, trachea and esophagus demonstrate no significant findings. Lungs/Pleura: There is a new small to moderate dependent left pleural effusion without pleural nodularity. There is no right-sided pleural effusion. Parenchymal scarring in the right upper lobe has mildly improved. There is additional scarring in the superior segment of the right lower lobe. The dominant left upper lobe nodule has not significantly changed, measuring 1.7 cm on image 64/6. However, there are several new left upper lobe nodules which are suspicious for progressive metastatic disease, largest measuring 5 mm on image 27/6. Musculoskeletal/Chest wall: As seen on recent cervical MRI, there is a left paraspinal mass at C7, measuring 3.2 x 2.4 cm on image 1/2. There is associated osseous destruction, neural foraminal extension and probable involvement of the brachial plexus. There are progressive rib metastases involving the right 6th rib anteriorly and the left 10th rib posteriorly. The sclerotic metastasis involving the T10 vertebral body has also mildly progressed, and there is new paraspinal and epidural tumor at this level. A component lateral to the left T10-11 foramen measures up to 2.4 cm on image 46/2. The CSF surrounding the cord appears partially effaced (axial images 43 through 45 of series  2). CT ABDOMEN AND PELVIS FINDINGS Hepatobiliary: There are enlarging low-density hepatic lesions consistent with metastatic disease. Largest measure 1.5 cm in the left lobe on image 64/2 and 1.0 cm inferiorly in the right lobe on image 65/2. No evidence of gallstones, gallbladder wall thickening or biliary dilatation. Pancreas: Unremarkable. No pancreatic ductal dilatation or surrounding inflammatory changes. Spleen: Normal in size without focal abnormality. Adrenals/Urinary Tract: Both adrenal glands appear normal. Nonobstructing bilateral renal calculi, cortical scarring and cystic lesions are grossly stable. No evidence of ureteral calculus or hydronephrosis. The bladder is nearly empty without apparent abnormality. Stomach/Bowel: No evidence of bowel wall thickening, distention or surrounding inflammatory change. Mild diverticular changes in the  sigmoid colon. Vascular/Lymphatic: There are no enlarged abdominal or pelvic lymph nodes. Aortic and branch vessel atherosclerosis again noted with irregular mural thrombus in the abdominal aorta. No acute vascular findings. Reproductive: Stable 1 cm right adnexal cyst. The uterus appears normal. Other: No ascites or peritoneal nodularity. Musculoskeletal: No acute or significant osseous findings. IMPRESSION: 1. Significant progression in metastatic disease. In the chest, there are enlarging nodal masses in the AP window and adjacent to the left ventricular apex, enlarging left lung nodules and progressive osseous metastases. 2. Left C7 osseous metastasis with paraspinal extension as seen on recent cervical MRI. There is also progressive metastatic disease within the T10 vertebral body with associated paraspinal and epidural tumor which may compromise the thoracic cord. 3. New hepatic metastatic disease. 4. These results will be called to the ordering clinician or representative by the Radiologist Assistant, and communication documented in the PACS or zVision Dashboard.  Electronically Signed   By: Richardean Sale M.D.   On: 06/21/2019 20:09    ASSESSMENT AND PLAN: This is a very pleasant 68 years old white female recently diagnosed with high-grade neuroendocrine carcinoma.  Molecular study shows no actionable mutations by [employer]. The patient underwent systemic chemotherapy with small cell regimen including carboplatin, etoposide and Tecentriq status post 2 cycles. She tolerated her treatment well but unfortunately repeat CT scan of the chest, abdomen and pelvis showed evidence for disease progression. The patient was started on systemic chemotherapy with carboplatin, Alimta and Keytruda status post 9 cycles.  Starting from cycle #7 the patient is on treatment with single agent Keytruda secondary to intolerance of the Alimta. The patient has been tolerating this treatment well except for the fatigue and weakness.  She had repeat CT scan of the chest, abdomen pelvis performed recently.  I personally and independently reviewed the scans and discussed the results with the patient and her son. Unfortunately her scan showed evidence for disease progression in the lung as well as the liver.  She also continues to have metastatic disease in the bone involvement C7 and suspicious lesion and the frontal bone of the skull. I had a lengthy discussion with the patient and her son about her current condition and treatment options.  I discussed with the patient the option of palliative care and hospice referral versus consideration of second line treatment with docetaxel and Cyramza with Neulasta support.  I discussed with the patient the adverse effect of this chemotherapy including but not limited to alopecia, myelosuppression, nausea and vomiting, peripheral neuropathy, liver or renal dysfunction as well as the risk of bleeding from [place]. The patient would like to take some time to discuss with her son her treatment options. For the new lesion in the skull, I will order MRI  of the brain to rule out any metastatic disease in the skull or brain. For the metastatic lesion at C7, I will refer the patient back to Dr. Isidore Moos for consideration of palliative radiotherapy.  I personally discussed with Dr. Isidore Moos her condition and she will arrange for her to be seen soon. For pain management she will continue her current treatment with fentanyl patch and Dilaudid.  She is managed by the pain clinic. For the suspicious nonocclusive blood clot at the tip of the Port-A-Cath, she was started on Coumadin 5 mg p.o. daily.  We will continue to monitor her PT/INR closely. For the anxiety and gagging reflex with medication, she is currently on Xanax 0.25 mg p.o. twice daily as needed. The patient was  advised to call immediately if she has any other concerning symptoms in the interval. The patient voices understanding of current disease status and treatment options and is in agreement with the current care plan. All questions were answered. The patient knows to call the clinic with any problems, questions or concerns. We can certainly see the patient much sooner if necessary.  Disclaimer: This note was dictated with voice recognition software. Similar sounding words can inadvertently be transcribed and may not be corrected upon review.

## 2019-06-25 ENCOUNTER — Other Ambulatory Visit: Payer: Self-pay | Admitting: *Deleted

## 2019-06-25 MED ORDER — ALPRAZOLAM 0.25 MG PO TABS: 0.2500 mg | ORAL_TABLET | Freq: Two times a day (BID) | ORAL | 0 refills | Status: AC | PRN

## 2019-06-26 ENCOUNTER — Ambulatory Visit
Admission: RE | Admit: 2019-06-26 | Discharge: 2019-06-26 | Disposition: A | Discharge status: Home / Self Care | Source: Ambulatory Visit | Attending: Radiation Oncology | Admitting: Radiation Oncology

## 2019-06-26 ENCOUNTER — Ambulatory Visit (HOSPITAL_COMMUNITY)
Admission: RE | Admit: 2019-06-26 | Discharge: 2019-06-26 | Disposition: A | Discharge status: Home / Self Care | Payer: Medicare Other | Source: Ambulatory Visit | Attending: Internal Medicine | Admitting: Internal Medicine

## 2019-06-26 ENCOUNTER — Other Ambulatory Visit: Payer: Self-pay

## 2019-06-26 ENCOUNTER — Telehealth: Payer: Self-pay | Admitting: *Deleted

## 2019-06-26 DIAGNOSIS — G939 Disorder of brain, unspecified: Secondary | ICD-10-CM | POA: Diagnosis not present

## 2019-06-26 DIAGNOSIS — C7951 Secondary malignant neoplasm of bone: Secondary | ICD-10-CM

## 2019-06-26 DIAGNOSIS — Z923 Personal history of irradiation: Secondary | ICD-10-CM | POA: Diagnosis not present

## 2019-06-26 DIAGNOSIS — C3490 Malignant neoplasm of unspecified part of unspecified bronchus or lung: Secondary | ICD-10-CM | POA: Diagnosis not present

## 2019-06-26 MED ORDER — GADOBUTROL 1 MMOL/ML IV SOLN
6.0000 mL | Freq: Once | INTRAVENOUS | Status: AC | PRN
  Administered 2019-06-26: 6 mL via INTRAVENOUS

## 2019-06-26 MED ORDER — HEPARIN SOD (PORK) LOCK FLUSH 100 UNIT/ML IV SOLN
INTRAVENOUS | Status: AC
  Filled 2019-06-26: qty 5

## 2019-06-26 MED ORDER — HEPARIN SOD (PORK) LOCK FLUSH 10 UNIT/ML IV SOLN
10.0000 [IU] | Freq: Once | INTRAVENOUS | Status: DC
  Filled 2019-06-26: qty 1

## 2019-06-26 NOTE — Telephone Encounter (Signed)
Received call from pt's son Shea Stakes.  He states he has some questions for Dr. Julien Nordmann related to his mother's visit on Monday.  Shea Stakes states that his mother is struggling with the current situation of incurable, progressive disease, treatment and Hospice options.  Shea Stakes states he understands her situation and wants her to be as comfortable as possible.. One question he had was about her overall prognosis without treatment. Explained to him about her progressive disease now affecting her liver, cervical spine mets and possibly frontal bone. Reveiwed notes from Dr. Julien Nordmann with him reinforcing that the chemo would not be of curative nature, perhaps stabilizing disease but with potential for serious side effects.  Discussed quality of life vs. quantity of life. Also reviewed Hospice services and the varied layers of care and support they can provide his mother and him as her son and caregiver.  Currently, Arbie Cookey has Palliative Care through [facility]. He will contact the nurse there for potential transition to Hospice. His mother has not totally agreed on this yet.  Shea Stakes feels she still does not grasp the seriousness of her current situation. Arbie Cookey does have an appt with Dr. Lanell Persons today related to radiation to her cervical spine mets. MRI of Brain has not been scheduled yet. Will call central scheduling about this.  Shea Stakes states he will notify us as to her Hospice status.

## 2019-06-28 DIAGNOSIS — C7951 Secondary malignant neoplasm of bone: Secondary | ICD-10-CM | POA: Diagnosis not present

## 2019-06-28 DIAGNOSIS — C3490 Malignant neoplasm of unspecified part of unspecified bronchus or lung: Secondary | ICD-10-CM | POA: Diagnosis not present

## 2019-06-28 DIAGNOSIS — Z923 Personal history of irradiation: Secondary | ICD-10-CM | POA: Diagnosis not present

## 2019-06-28 NOTE — Progress Notes (Signed)
  Radiation Oncology         [phone] ________________________________  Name: Alison Sullivan MRN: [id number]  Date: 06/26/2019  DOB: [birth year]  SIMULATION AND TREATMENT PLANNING NOTE  Outpatient  DIAGNOSIS:     ICD-10-CM   1. Bone metastases ([place])  C79.51     NARRATIVE:  The patient was brought to the [facility].  Identity was confirmed.  All relevant records and images related to the planned course of therapy were reviewed.      Today, I talked to the patient about the findings and work-up thus far. We discussed the patient's diagnosis of progressive bone metastases and general treatment for this, highlighting the role of radiotherapy in the management. We discussed the available radiation techniques, and focused on the details of logistics and delivery.    We discussed the risks, benefits, and side effects of radiotherapy. Side effects may include but not necessarily be limited to: skin irritation,  esophagitis, hair loss, fatigue.  No guarantees of treatment were given.The patient freely provided informed written consent to proceed with treatment after reviewing the details related to the planned course of therapy. The consent form was witnessed and verified by the simulation staff.The patient was encouraged to ask questions that I answered to the best of my ability.    Then, the patient was set-up in a stable reproducible  supine position for radiation therapy.  Custom bolus and a custom mask were mask. CT images were obtained.  Surface markings were placed.  The CT images were loaded into the planning software.    TREATMENT PLANNING NOTE: Treatment planning then occurred.  The radiation prescription was entered and confirmed.    A total of 6 medically necessary complex treatment devices were fabricated and supervised by me, in the form of 5 fields with MLCs and a custom mask for immobilization. MORE FIELDS WITH MLCs MAY BE ADDED IN DOSIMETRY for dose  homogeneity.  I have requested : 3D Simulation  I have requested a DVH of the following structures: esophagus, brain, targets.   The patient will receive 20 Gy in 5 fractions to her skull tumor and her cervical spine tumor.   -----------------------------------  [name], MD

## 2019-06-29 ENCOUNTER — Other Ambulatory Visit: Payer: Medicare Other

## 2019-07-01 ENCOUNTER — Other Ambulatory Visit: Payer: Self-pay

## 2019-07-01 ENCOUNTER — Ambulatory Visit: Admission: RE | Admit: 2019-07-01 | Source: Ambulatory Visit | Admitting: Radiation Oncology

## 2019-07-01 ENCOUNTER — Other Ambulatory Visit: Payer: Self-pay | Admitting: Radiation Oncology

## 2019-07-01 DIAGNOSIS — C7951 Secondary malignant neoplasm of bone: Secondary | ICD-10-CM

## 2019-07-01 MED ORDER — LORAZEPAM 1 MG PO TABS: ORAL_TABLET | ORAL | 0 refills | Status: AC

## 2019-07-02 ENCOUNTER — Other Ambulatory Visit: Payer: Self-pay

## 2019-07-02 ENCOUNTER — Ambulatory Visit
Admission: RE | Admit: 2019-07-02 | Discharge: 2019-07-02 | Disposition: A | Discharge status: Home / Self Care | Source: Ambulatory Visit | Attending: Radiation Oncology | Admitting: Radiation Oncology

## 2019-07-02 DIAGNOSIS — C3490 Malignant neoplasm of unspecified part of unspecified bronchus or lung: Secondary | ICD-10-CM | POA: Diagnosis not present

## 2019-07-02 DIAGNOSIS — C7951 Secondary malignant neoplasm of bone: Secondary | ICD-10-CM | POA: Diagnosis not present

## 2019-07-02 DIAGNOSIS — Z923 Personal history of irradiation: Secondary | ICD-10-CM | POA: Diagnosis not present

## 2019-07-03 ENCOUNTER — Ambulatory Visit

## 2019-07-03 ENCOUNTER — Telehealth: Payer: Self-pay | Admitting: Medical Oncology

## 2019-07-03 ENCOUNTER — Telehealth: Payer: Self-pay

## 2019-07-03 DIAGNOSIS — C7951 Secondary malignant neoplasm of bone: Secondary | ICD-10-CM | POA: Diagnosis not present

## 2019-07-03 DIAGNOSIS — G588 Other specified mononeuropathies: Secondary | ICD-10-CM | POA: Diagnosis not present

## 2019-07-03 NOTE — Telephone Encounter (Signed)
Pt requests hospice -[facility] requests Dr Karie Georges manage pain.

## 2019-07-03 NOTE — Telephone Encounter (Signed)
I received a voice mail message from Ms. Alison Sullivan's son, Alison Sullivan. He cancelled her appointment today for radiation. She has another appointment with her pain doctor today, and she didn't want to miss it. I have notified [facility]. They will add the missed appointment to the end of her Radiation treatments.

## 2019-07-04 ENCOUNTER — Ambulatory Visit
Admission: RE | Admit: 2019-07-04 | Discharge: 2019-07-04 | Disposition: A | Discharge status: Home / Self Care | Source: Ambulatory Visit | Attending: Radiation Oncology | Admitting: Radiation Oncology

## 2019-07-04 ENCOUNTER — Other Ambulatory Visit: Payer: Self-pay

## 2019-07-04 DIAGNOSIS — C3490 Malignant neoplasm of unspecified part of unspecified bronchus or lung: Secondary | ICD-10-CM | POA: Diagnosis not present

## 2019-07-04 DIAGNOSIS — C7951 Secondary malignant neoplasm of bone: Secondary | ICD-10-CM | POA: Diagnosis not present

## 2019-07-04 DIAGNOSIS — Z923 Personal history of irradiation: Secondary | ICD-10-CM | POA: Diagnosis not present

## 2019-07-05 ENCOUNTER — Telehealth: Payer: Self-pay | Admitting: Medical Oncology

## 2019-07-05 ENCOUNTER — Other Ambulatory Visit: Payer: Self-pay

## 2019-07-05 ENCOUNTER — Ambulatory Visit

## 2019-07-05 ENCOUNTER — Ambulatory Visit: Payer: Self-pay | Admitting: Radiation Oncology

## 2019-07-05 ENCOUNTER — Ambulatory Visit
Admission: RE | Admit: 2019-07-05 | Discharge: 2019-07-05 | Disposition: A | Discharge status: Home / Self Care | Source: Ambulatory Visit | Attending: Radiation Oncology | Admitting: Radiation Oncology

## 2019-07-05 DIAGNOSIS — J449 Chronic obstructive pulmonary disease, unspecified: Secondary | ICD-10-CM | POA: Diagnosis not present

## 2019-07-05 DIAGNOSIS — C3490 Malignant neoplasm of unspecified part of unspecified bronchus or lung: Secondary | ICD-10-CM | POA: Diagnosis not present

## 2019-07-05 DIAGNOSIS — Z7901 Long term (current) use of anticoagulants: Secondary | ICD-10-CM | POA: Diagnosis not present

## 2019-07-05 DIAGNOSIS — Z923 Personal history of irradiation: Secondary | ICD-10-CM | POA: Diagnosis not present

## 2019-07-05 DIAGNOSIS — C7951 Secondary malignant neoplasm of bone: Secondary | ICD-10-CM | POA: Diagnosis not present

## 2019-07-05 DIAGNOSIS — T82868D Thrombosis of vascular prosthetic devices, implants and grafts, subsequent encounter: Secondary | ICD-10-CM | POA: Diagnosis not present

## 2019-07-05 DIAGNOSIS — C7A1 Malignant poorly differentiated neuroendocrine tumors: Secondary | ICD-10-CM | POA: Diagnosis not present

## 2019-07-05 DIAGNOSIS — C7B8 Other secondary neuroendocrine tumors: Secondary | ICD-10-CM | POA: Diagnosis not present

## 2019-07-05 DIAGNOSIS — Z87891 Personal history of nicotine dependence: Secondary | ICD-10-CM | POA: Diagnosis not present

## 2019-07-05 NOTE — Telephone Encounter (Signed)
Discontinue meds - Shea Stakes asking what meds can his mother stop taking. I recommended she stop:  KDUR, Mag oxide, MVI and Folic acid.    I told him I will ask Julien Nordmann to advise.

## 2019-07-06 DIAGNOSIS — T82868D Thrombosis of vascular prosthetic devices, implants and grafts, subsequent encounter: Secondary | ICD-10-CM | POA: Diagnosis not present

## 2019-07-06 DIAGNOSIS — C7A1 Malignant poorly differentiated neuroendocrine tumors: Secondary | ICD-10-CM | POA: Diagnosis not present

## 2019-07-06 DIAGNOSIS — C7B8 Other secondary neuroendocrine tumors: Secondary | ICD-10-CM | POA: Diagnosis not present

## 2019-07-06 DIAGNOSIS — Z87891 Personal history of nicotine dependence: Secondary | ICD-10-CM | POA: Diagnosis not present

## 2019-07-06 DIAGNOSIS — Z7901 Long term (current) use of anticoagulants: Secondary | ICD-10-CM | POA: Diagnosis not present

## 2019-07-06 DIAGNOSIS — J449 Chronic obstructive pulmonary disease, unspecified: Secondary | ICD-10-CM | POA: Diagnosis not present

## 2019-07-07 DIAGNOSIS — J449 Chronic obstructive pulmonary disease, unspecified: Secondary | ICD-10-CM | POA: Diagnosis not present

## 2019-07-07 DIAGNOSIS — C7B8 Other secondary neuroendocrine tumors: Secondary | ICD-10-CM | POA: Diagnosis not present

## 2019-07-07 DIAGNOSIS — Z87891 Personal history of nicotine dependence: Secondary | ICD-10-CM | POA: Diagnosis not present

## 2019-07-07 DIAGNOSIS — T82868D Thrombosis of vascular prosthetic devices, implants and grafts, subsequent encounter: Secondary | ICD-10-CM | POA: Diagnosis not present

## 2019-07-07 DIAGNOSIS — Z7901 Long term (current) use of anticoagulants: Secondary | ICD-10-CM | POA: Diagnosis not present

## 2019-07-07 DIAGNOSIS — C7A1 Malignant poorly differentiated neuroendocrine tumors: Secondary | ICD-10-CM | POA: Diagnosis not present

## 2019-07-08 ENCOUNTER — Ambulatory Visit

## 2019-07-08 ENCOUNTER — Ambulatory Visit
Admission: RE | Admit: 2019-07-08 | Discharge: 2019-07-08 | Disposition: A | Discharge status: Home / Self Care | Source: Ambulatory Visit | Attending: Radiation Oncology | Admitting: Radiation Oncology

## 2019-07-08 ENCOUNTER — Other Ambulatory Visit: Payer: Self-pay

## 2019-07-08 DIAGNOSIS — Z7901 Long term (current) use of anticoagulants: Secondary | ICD-10-CM | POA: Diagnosis not present

## 2019-07-08 DIAGNOSIS — T82868D Thrombosis of vascular prosthetic devices, implants and grafts, subsequent encounter: Secondary | ICD-10-CM | POA: Diagnosis not present

## 2019-07-08 DIAGNOSIS — C7A1 Malignant poorly differentiated neuroendocrine tumors: Secondary | ICD-10-CM | POA: Diagnosis not present

## 2019-07-08 DIAGNOSIS — C7951 Secondary malignant neoplasm of bone: Secondary | ICD-10-CM | POA: Diagnosis not present

## 2019-07-08 DIAGNOSIS — Z87891 Personal history of nicotine dependence: Secondary | ICD-10-CM | POA: Diagnosis not present

## 2019-07-08 DIAGNOSIS — J449 Chronic obstructive pulmonary disease, unspecified: Secondary | ICD-10-CM | POA: Diagnosis not present

## 2019-07-08 DIAGNOSIS — C7B8 Other secondary neuroendocrine tumors: Secondary | ICD-10-CM | POA: Diagnosis not present

## 2019-07-09 ENCOUNTER — Other Ambulatory Visit: Payer: Self-pay

## 2019-07-09 ENCOUNTER — Ambulatory Visit

## 2019-07-09 ENCOUNTER — Encounter: Payer: Self-pay | Admitting: Radiation Oncology

## 2019-07-09 ENCOUNTER — Ambulatory Visit
Admission: RE | Admit: 2019-07-09 | Discharge: 2019-07-09 | Disposition: A | Discharge status: Home / Self Care | Source: Ambulatory Visit | Attending: Radiation Oncology | Admitting: Radiation Oncology

## 2019-07-09 DIAGNOSIS — C7A1 Malignant poorly differentiated neuroendocrine tumors: Secondary | ICD-10-CM | POA: Diagnosis not present

## 2019-07-09 DIAGNOSIS — T82868D Thrombosis of vascular prosthetic devices, implants and grafts, subsequent encounter: Secondary | ICD-10-CM | POA: Diagnosis not present

## 2019-07-09 DIAGNOSIS — Z7901 Long term (current) use of anticoagulants: Secondary | ICD-10-CM | POA: Diagnosis not present

## 2019-07-09 DIAGNOSIS — C7951 Secondary malignant neoplasm of bone: Secondary | ICD-10-CM | POA: Diagnosis not present

## 2019-07-09 DIAGNOSIS — Z87891 Personal history of nicotine dependence: Secondary | ICD-10-CM | POA: Diagnosis not present

## 2019-07-09 DIAGNOSIS — C7B8 Other secondary neuroendocrine tumors: Secondary | ICD-10-CM | POA: Diagnosis not present

## 2019-07-09 DIAGNOSIS — J449 Chronic obstructive pulmonary disease, unspecified: Secondary | ICD-10-CM | POA: Diagnosis not present

## 2019-07-10 ENCOUNTER — Ambulatory Visit

## 2019-07-11 ENCOUNTER — Ambulatory Visit

## 2019-07-12 ENCOUNTER — Ambulatory Visit

## 2019-07-12 DIAGNOSIS — C7B8 Other secondary neuroendocrine tumors: Secondary | ICD-10-CM | POA: Diagnosis not present

## 2019-07-12 DIAGNOSIS — Z87891 Personal history of nicotine dependence: Secondary | ICD-10-CM | POA: Diagnosis not present

## 2019-07-12 DIAGNOSIS — J449 Chronic obstructive pulmonary disease, unspecified: Secondary | ICD-10-CM | POA: Diagnosis not present

## 2019-07-12 DIAGNOSIS — Z7901 Long term (current) use of anticoagulants: Secondary | ICD-10-CM | POA: Diagnosis not present

## 2019-07-12 DIAGNOSIS — T82868D Thrombosis of vascular prosthetic devices, implants and grafts, subsequent encounter: Secondary | ICD-10-CM | POA: Diagnosis not present

## 2019-07-12 DIAGNOSIS — C7A1 Malignant poorly differentiated neuroendocrine tumors: Secondary | ICD-10-CM | POA: Diagnosis not present

## 2019-07-13 DIAGNOSIS — Z7901 Long term (current) use of anticoagulants: Secondary | ICD-10-CM | POA: Diagnosis not present

## 2019-07-13 DIAGNOSIS — T82868D Thrombosis of vascular prosthetic devices, implants and grafts, subsequent encounter: Secondary | ICD-10-CM | POA: Diagnosis not present

## 2019-07-13 DIAGNOSIS — C7B8 Other secondary neuroendocrine tumors: Secondary | ICD-10-CM | POA: Diagnosis not present

## 2019-07-13 DIAGNOSIS — Z87891 Personal history of nicotine dependence: Secondary | ICD-10-CM | POA: Diagnosis not present

## 2019-07-13 DIAGNOSIS — J449 Chronic obstructive pulmonary disease, unspecified: Secondary | ICD-10-CM | POA: Diagnosis not present

## 2019-07-13 DIAGNOSIS — C7A1 Malignant poorly differentiated neuroendocrine tumors: Secondary | ICD-10-CM | POA: Diagnosis not present

## 2019-07-14 DIAGNOSIS — J449 Chronic obstructive pulmonary disease, unspecified: Secondary | ICD-10-CM | POA: Diagnosis not present

## 2019-07-14 DIAGNOSIS — Z7901 Long term (current) use of anticoagulants: Secondary | ICD-10-CM | POA: Diagnosis not present

## 2019-07-14 DIAGNOSIS — C7B8 Other secondary neuroendocrine tumors: Secondary | ICD-10-CM | POA: Diagnosis not present

## 2019-07-14 DIAGNOSIS — Z87891 Personal history of nicotine dependence: Secondary | ICD-10-CM | POA: Diagnosis not present

## 2019-07-14 DIAGNOSIS — C7A1 Malignant poorly differentiated neuroendocrine tumors: Secondary | ICD-10-CM | POA: Diagnosis not present

## 2019-07-14 DIAGNOSIS — T82868D Thrombosis of vascular prosthetic devices, implants and grafts, subsequent encounter: Secondary | ICD-10-CM | POA: Diagnosis not present

## 2019-07-15 ENCOUNTER — Ambulatory Visit: Payer: Medicare Other

## 2019-07-15 ENCOUNTER — Ambulatory Visit: Payer: Medicare Other | Admitting: Internal Medicine

## 2019-07-15 ENCOUNTER — Other Ambulatory Visit: Payer: Medicare Other

## 2019-07-15 DIAGNOSIS — J449 Chronic obstructive pulmonary disease, unspecified: Secondary | ICD-10-CM | POA: Diagnosis not present

## 2019-07-15 DIAGNOSIS — Z87891 Personal history of nicotine dependence: Secondary | ICD-10-CM | POA: Diagnosis not present

## 2019-07-15 DIAGNOSIS — Z7901 Long term (current) use of anticoagulants: Secondary | ICD-10-CM | POA: Diagnosis not present

## 2019-07-15 DIAGNOSIS — C7B8 Other secondary neuroendocrine tumors: Secondary | ICD-10-CM | POA: Diagnosis not present

## 2019-07-15 DIAGNOSIS — T82868D Thrombosis of vascular prosthetic devices, implants and grafts, subsequent encounter: Secondary | ICD-10-CM | POA: Diagnosis not present

## 2019-07-15 DIAGNOSIS — C7A1 Malignant poorly differentiated neuroendocrine tumors: Secondary | ICD-10-CM | POA: Diagnosis not present

## 2019-07-16 DIAGNOSIS — C7B8 Other secondary neuroendocrine tumors: Secondary | ICD-10-CM | POA: Diagnosis not present

## 2019-07-16 DIAGNOSIS — Z87891 Personal history of nicotine dependence: Secondary | ICD-10-CM | POA: Diagnosis not present

## 2019-07-16 DIAGNOSIS — K5792 Diverticulitis of intestine, part unspecified, without perforation or abscess without bleeding: Secondary | ICD-10-CM | POA: Diagnosis not present

## 2019-07-16 DIAGNOSIS — Z452 Encounter for adjustment and management of vascular access device: Secondary | ICD-10-CM | POA: Diagnosis not present

## 2019-07-16 DIAGNOSIS — C7A1 Malignant poorly differentiated neuroendocrine tumors: Secondary | ICD-10-CM | POA: Diagnosis not present

## 2019-07-16 DIAGNOSIS — J449 Chronic obstructive pulmonary disease, unspecified: Secondary | ICD-10-CM | POA: Diagnosis not present

## 2019-07-16 DIAGNOSIS — T82868D Thrombosis of vascular prosthetic devices, implants and grafts, subsequent encounter: Secondary | ICD-10-CM | POA: Diagnosis not present

## 2019-07-16 DIAGNOSIS — Z436 Encounter for attention to other artificial openings of urinary tract: Secondary | ICD-10-CM | POA: Diagnosis not present

## 2019-07-16 DIAGNOSIS — Z7901 Long term (current) use of anticoagulants: Secondary | ICD-10-CM | POA: Diagnosis not present

## 2019-07-17 DIAGNOSIS — C7A1 Malignant poorly differentiated neuroendocrine tumors: Secondary | ICD-10-CM | POA: Diagnosis not present

## 2019-07-17 DIAGNOSIS — Z436 Encounter for attention to other artificial openings of urinary tract: Secondary | ICD-10-CM | POA: Diagnosis not present

## 2019-07-17 DIAGNOSIS — Z452 Encounter for adjustment and management of vascular access device: Secondary | ICD-10-CM | POA: Diagnosis not present

## 2019-07-17 DIAGNOSIS — J449 Chronic obstructive pulmonary disease, unspecified: Secondary | ICD-10-CM | POA: Diagnosis not present

## 2019-07-17 DIAGNOSIS — C7B8 Other secondary neuroendocrine tumors: Secondary | ICD-10-CM | POA: Diagnosis not present

## 2019-07-17 DIAGNOSIS — T82868D Thrombosis of vascular prosthetic devices, implants and grafts, subsequent encounter: Secondary | ICD-10-CM | POA: Diagnosis not present

## 2019-07-19 ENCOUNTER — Telehealth: Payer: Self-pay | Admitting: *Deleted

## 2019-07-19 DIAGNOSIS — Z452 Encounter for adjustment and management of vascular access device: Secondary | ICD-10-CM | POA: Diagnosis not present

## 2019-07-19 DIAGNOSIS — C7A1 Malignant poorly differentiated neuroendocrine tumors: Secondary | ICD-10-CM | POA: Diagnosis not present

## 2019-07-19 DIAGNOSIS — J449 Chronic obstructive pulmonary disease, unspecified: Secondary | ICD-10-CM | POA: Diagnosis not present

## 2019-07-19 DIAGNOSIS — C7B8 Other secondary neuroendocrine tumors: Secondary | ICD-10-CM | POA: Diagnosis not present

## 2019-07-19 DIAGNOSIS — Z436 Encounter for attention to other artificial openings of urinary tract: Secondary | ICD-10-CM | POA: Diagnosis not present

## 2019-07-19 DIAGNOSIS — T82868D Thrombosis of vascular prosthetic devices, implants and grafts, subsequent encounter: Secondary | ICD-10-CM | POA: Diagnosis not present

## 2019-07-24 ENCOUNTER — Encounter: Payer: Self-pay | Admitting: Radiation Oncology

## 2019-07-24 NOTE — Progress Notes (Signed)
Letter of condolence sent to the patient's son, Shea Stakes. -----------------------------------  [name], MD

## 2019-08-02 NOTE — Progress Notes (Signed)
  Patient Name: Alison Sullivan MRN: [id number] DOB: Feb 11, 1951 Referring Physician: Christinia Gully (Profile Not Attached) Date of Service: 07/09/2019 [facility], [place]                                                        End Of Treatment Note  Diagnoses: C79.51-Secondary malignant neoplasm of bone  Cancer Staging: Stage IV Lung Cancer  Intent: Palliative  Radiation Treatment Dates:  8/18//20 through 07/09/2019 Site Technique Total Dose, Gy Dose per Fx Completed Fx Beam Energies         Brain: Brain_skull 3D 20/20 4 5/5 6X  Spine: Spine_C6-T1 3D 20/20 4 5/5 6X, 10X   Narrative: The patient tolerated radiation therapy relatively well.   Plan: The patient will follow-up with radiation oncology in 1 mo; Palliative care is involved, hospice anticipated. I have spoken extensively with Alison Sullivan and her son about her terminal condition and goals of care. ________________________________________________    [name], MD

## 2019-08-05 ENCOUNTER — Ambulatory Visit: Payer: Medicare Other | Admitting: Internal Medicine

## 2019-08-05 ENCOUNTER — Other Ambulatory Visit: Payer: Medicare Other

## 2019-08-05 ENCOUNTER — Ambulatory Visit: Payer: Medicare Other

## 2019-08-09 ENCOUNTER — Ambulatory Visit: Payer: Self-pay | Admitting: Radiation Oncology

## 2019-08-15 NOTE — Telephone Encounter (Signed)
Son called to make Dr Julien Nordmann and staff aware that Ms Gavidia died this morning at [facility]. Was very appreciative of all the good care by staff at [facility].

## 2019-08-15 DEATH — deceased

## 2019-08-19 ENCOUNTER — Other Ambulatory Visit: Payer: Self-pay | Admitting: Internal Medicine

## 2019-10-23 ENCOUNTER — Encounter: Payer: Self-pay | Admitting: *Deleted

## 2020-01-06 IMAGING — DX DG ANKLE COMPLETE 3+V*L*
3 series · 3 of 3 positions shown · non-contrast
Comparison: 02/03/2008.

CLINICAL DATA: Fall 2 days ago.  Pain and swelling.

EXAM:
LEFT ANKLE COMPLETE - 3+ VIEW

[ankle ap]
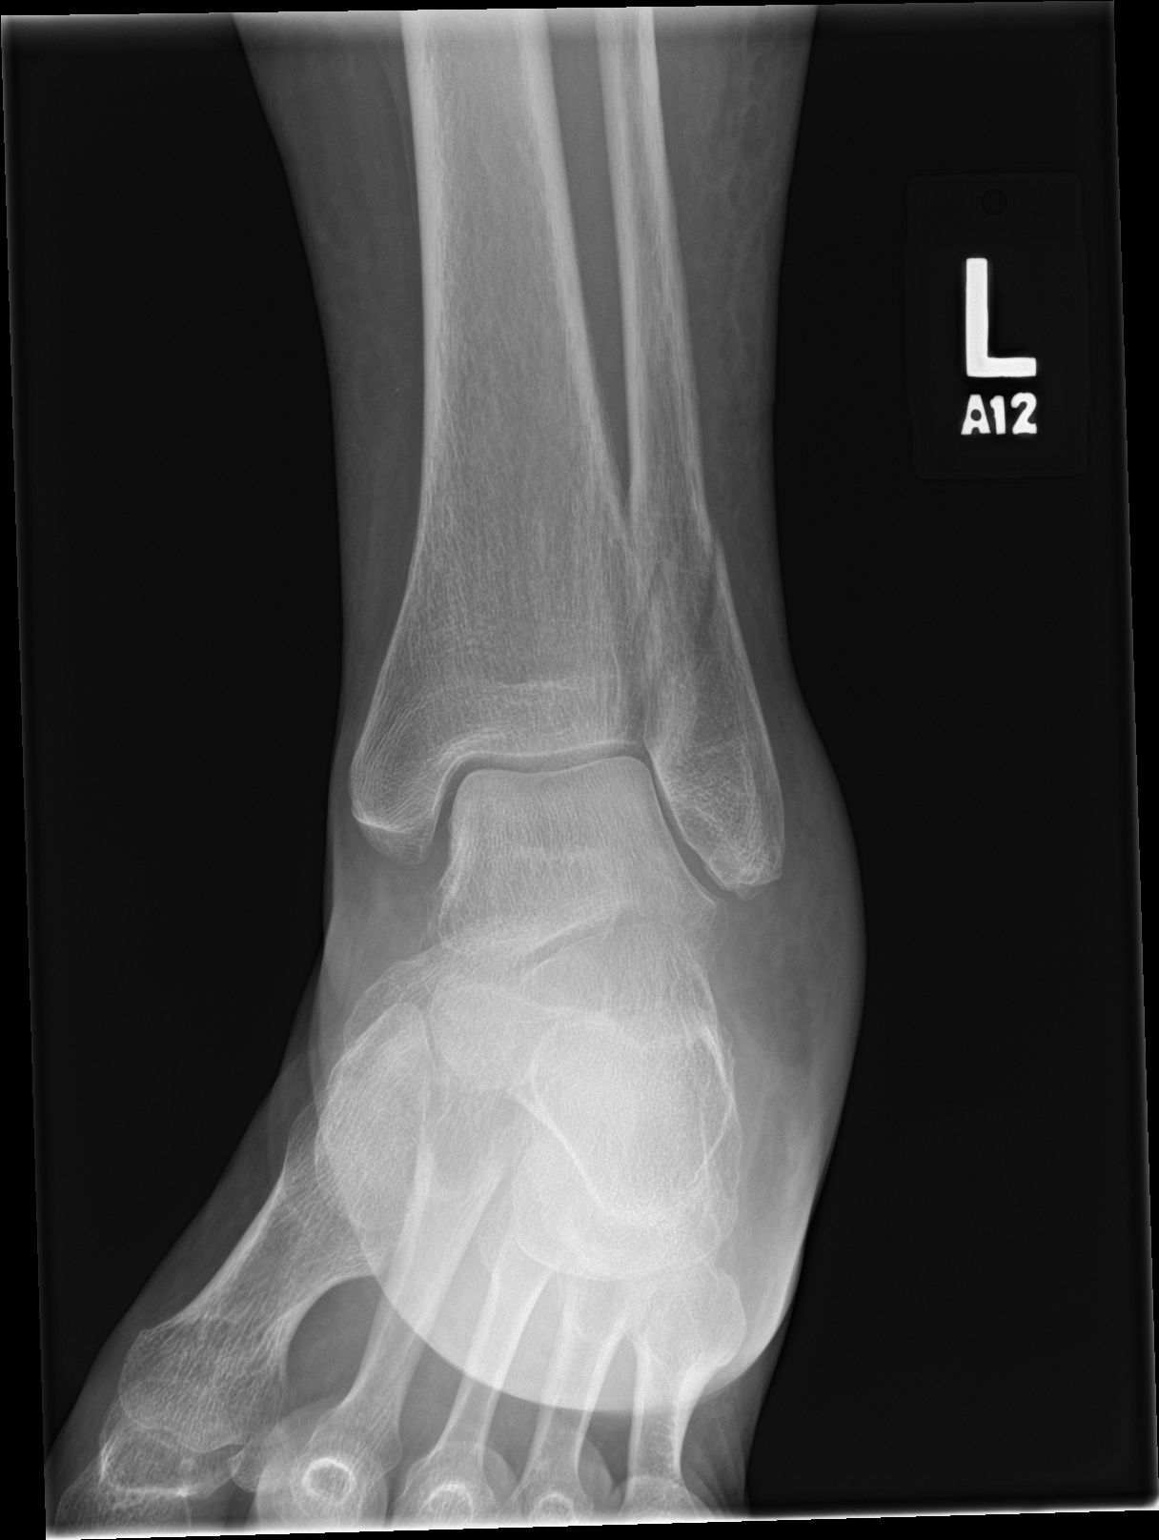

[ankle obl]
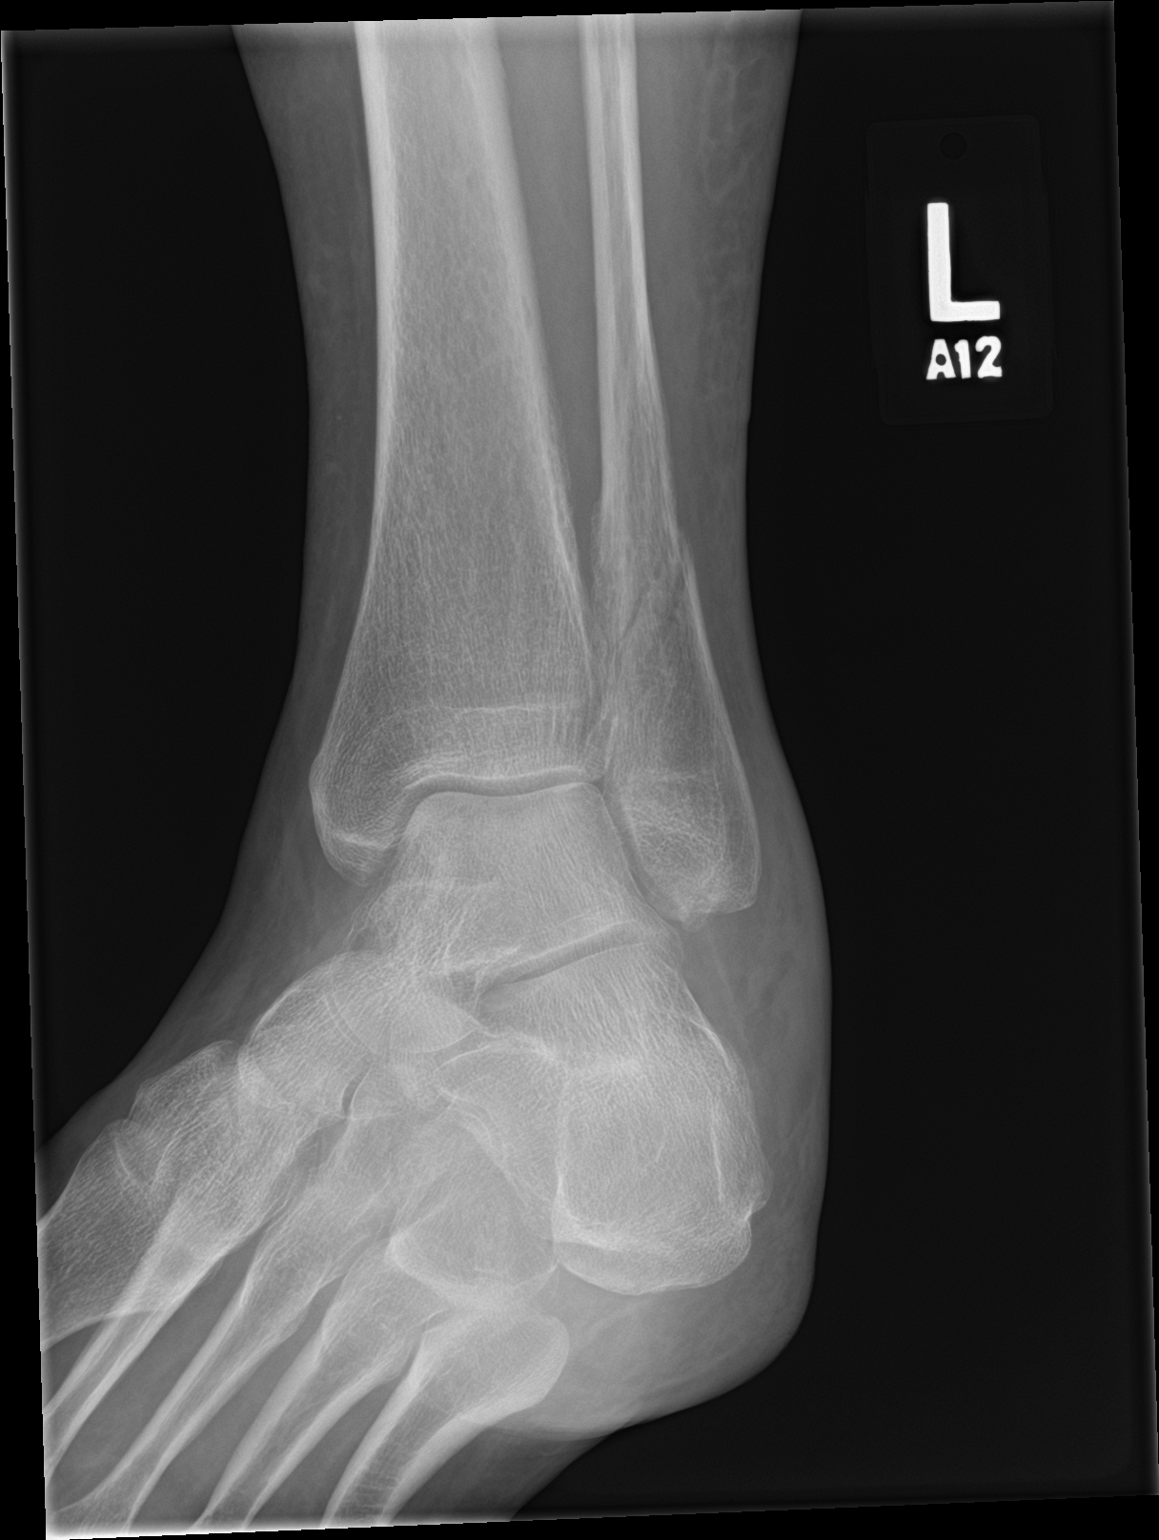

[ankle lat]
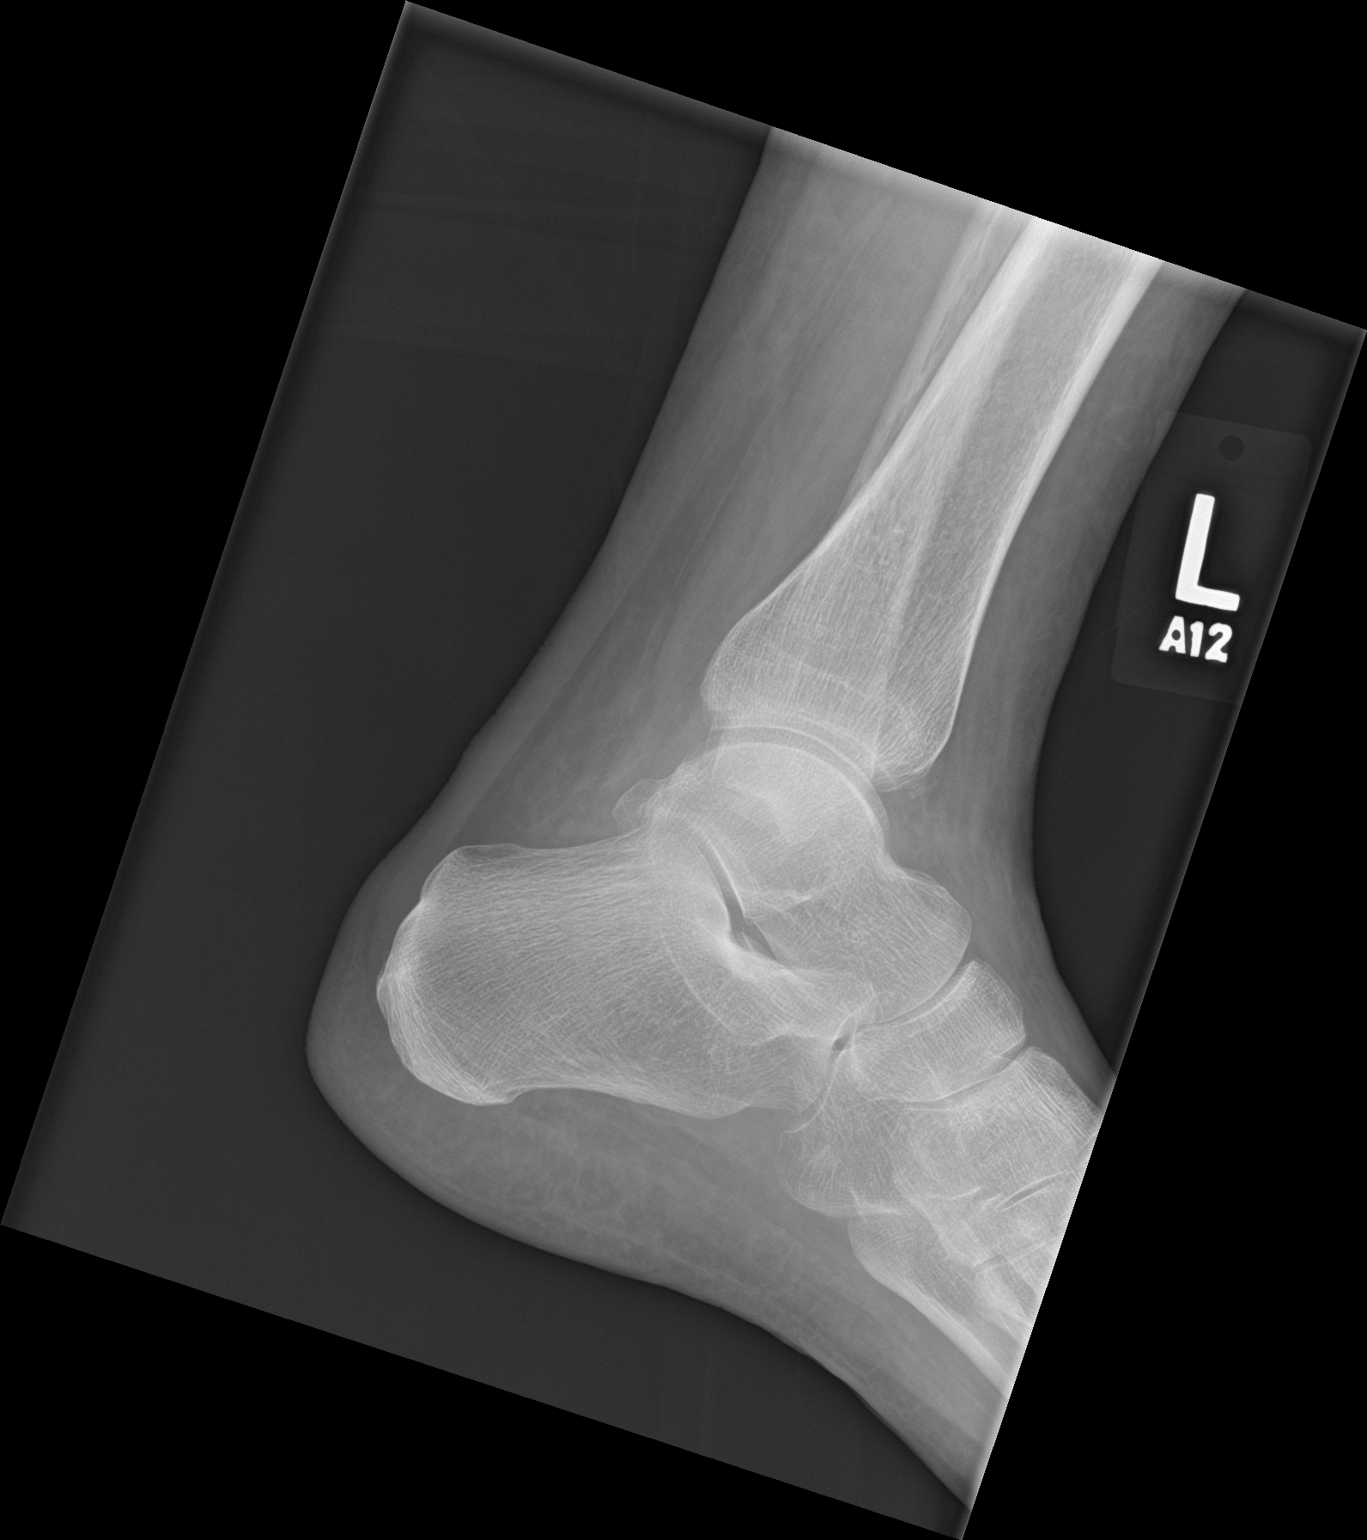

[3 of 3 positions shown; findings below may reference images not displayed]

FINDINGS: Slightly displaced oblique fracture of the distal left fibula noted.
Medial malleolus is intact. No other focal abnormality.
IMPRESSION: Slightly displaced oblique fracture of the distal aspect of the left
fibula. Medial malleolus intact.

## 2020-08-21 IMAGING — CT CT BIOPSY
1 of 2 series · 15 of 31 positions shown, 19 images · non-contrast
Comparison: none

INDICATION: 67-year-old female with a new lung mass concerning for primary lung
carcinoma and multiple regions of metastases. She presents today for
biopsy of chest wall mass.

[Series 2: i-spiral 5.0 b40f · axial · 0.98mm/px · z∈[-92,-2]mm · 15 of 30 slices shown, 19 images]
[im 2/30  mediastinal]
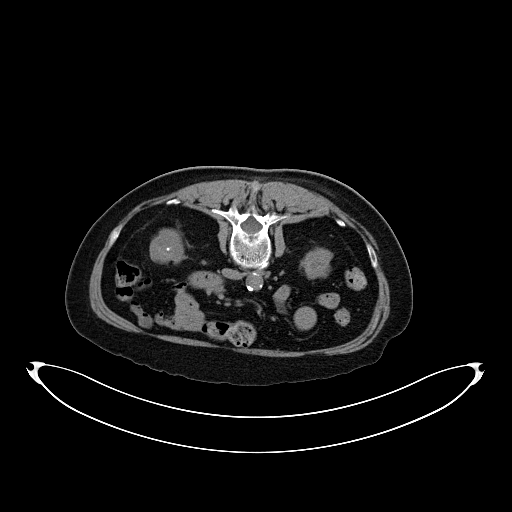
[im 2/30  lung]
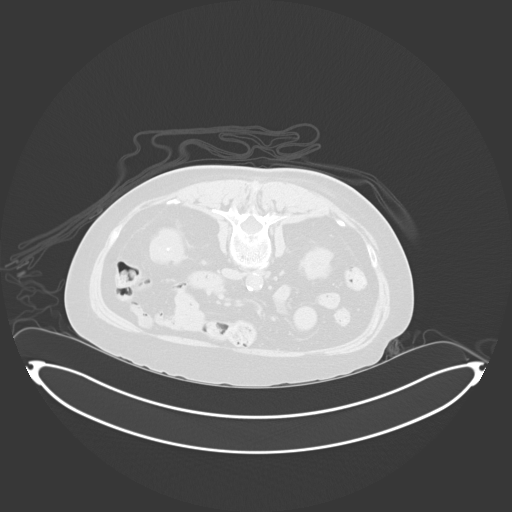
[im 5/30  lung]
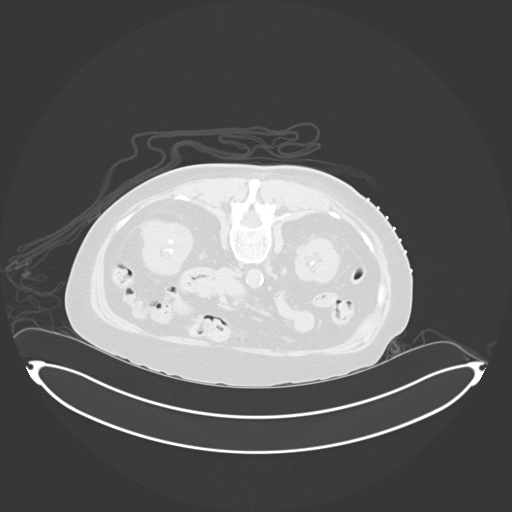
[im 6/30  lung]
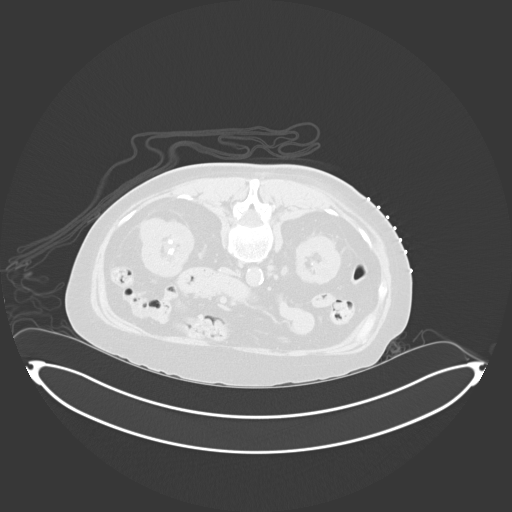
[im 8/30  lung]
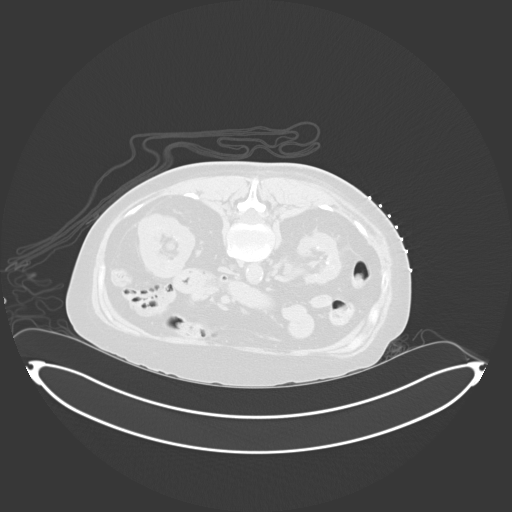
[im 9/30  mediastinal]
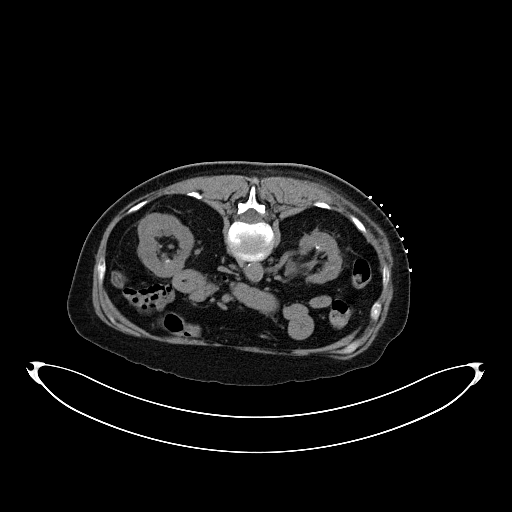
[im 9/30  lung]
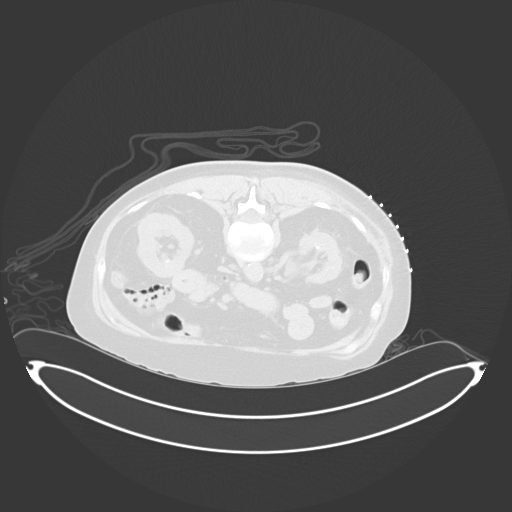
[im 12/30  lung]
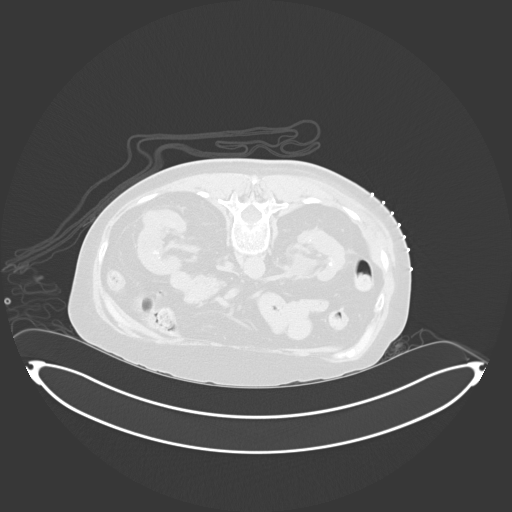
[im 13/30  lung]
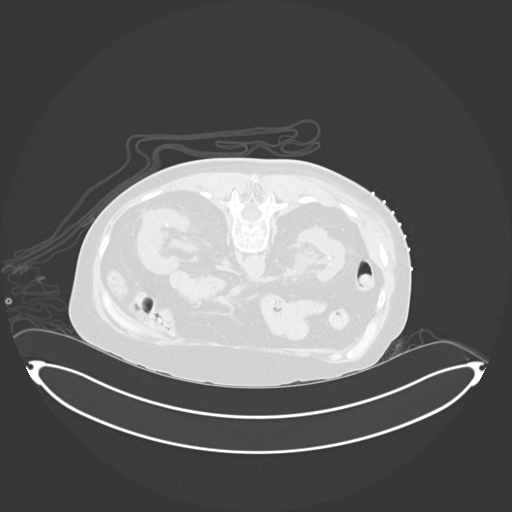
[im 15/30  lung]
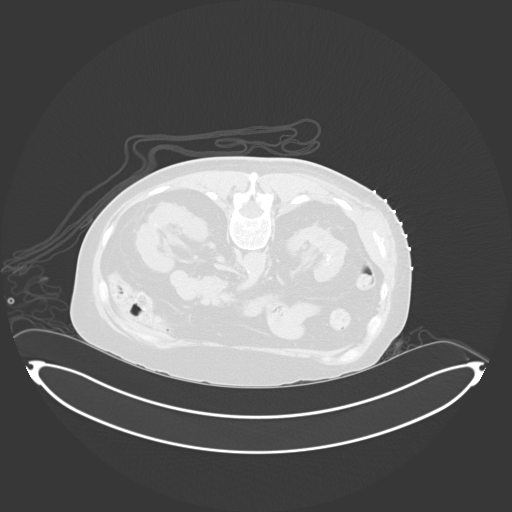
[im 17/30  mediastinal]
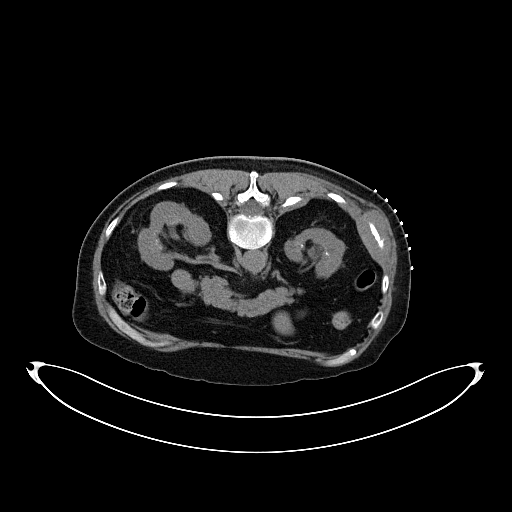
[im 17/30  lung]
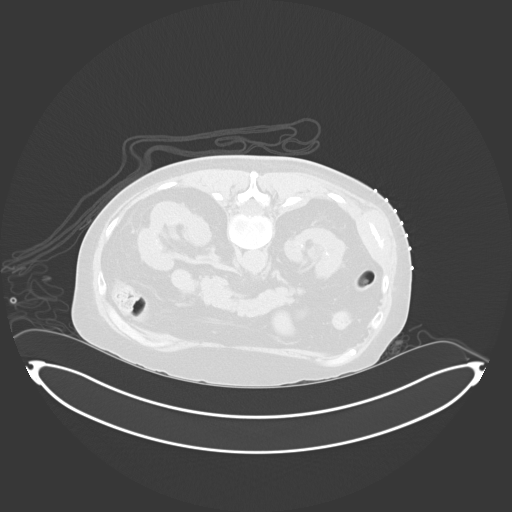
[im 18/30  lung]
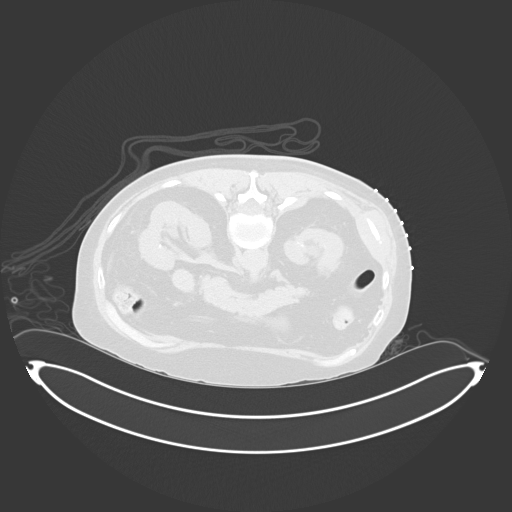
[im 21/30  lung]
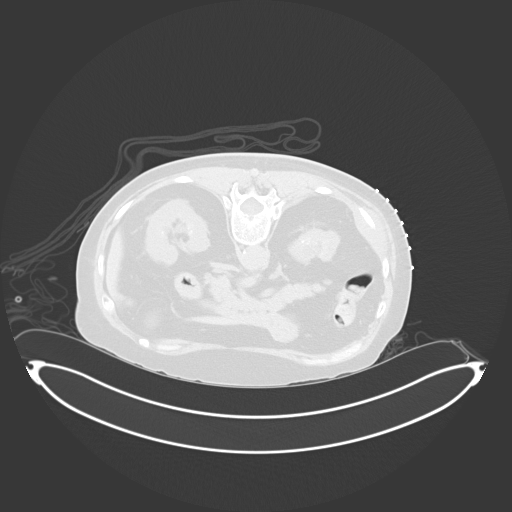
[im 22/30  lung]
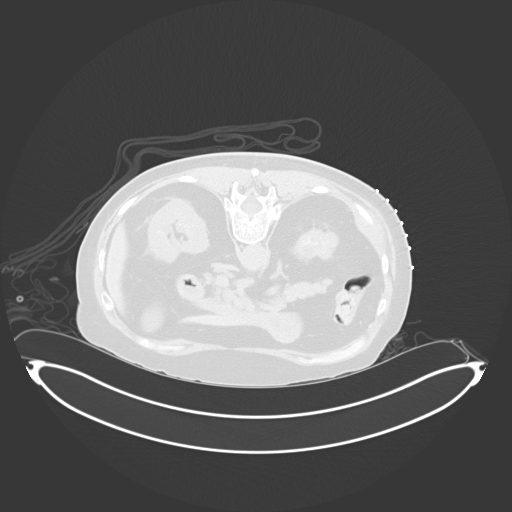
[im 24/30  mediastinal]
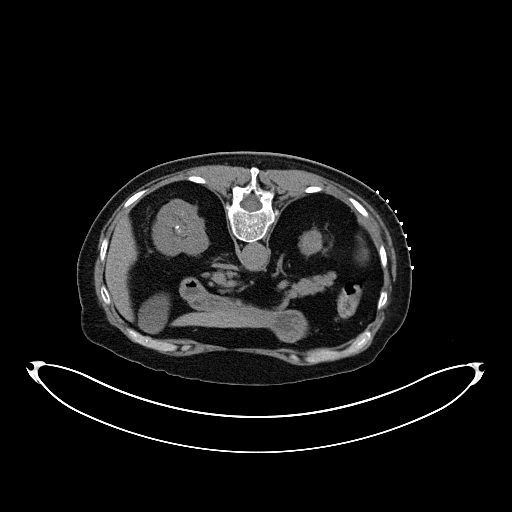
[im 24/30  lung]
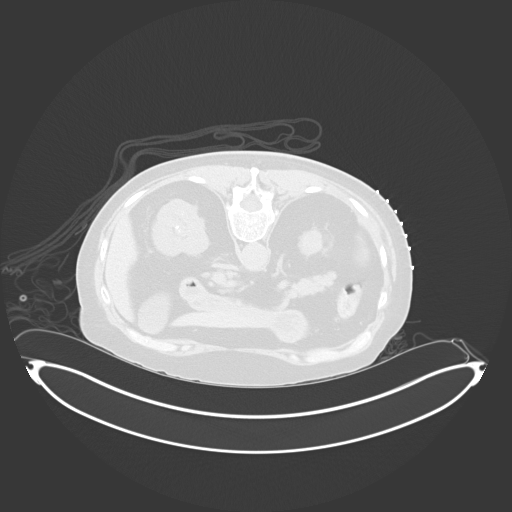
[im 25/30  lung]
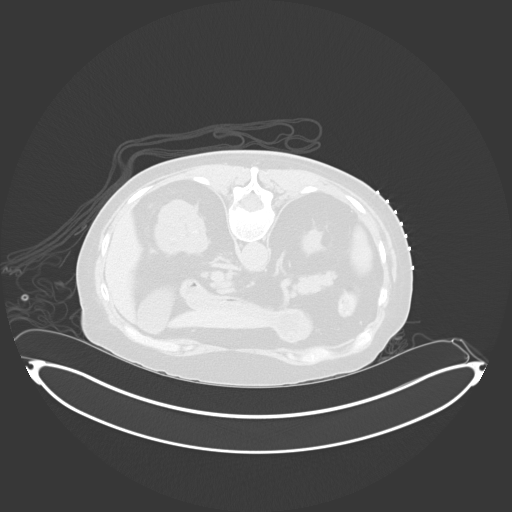
[im 28/30  lung]
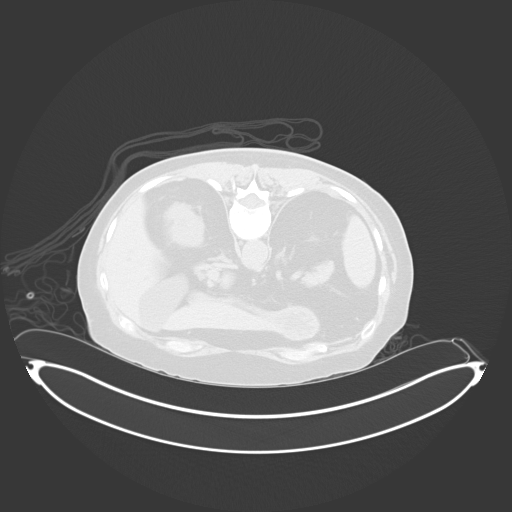

[15 of 31 positions shown; findings below may reference images not displayed]

EXAM:
CT BIOPSY

MEDICATIONS:
None.

ANESTHESIA/SEDATION:
Moderate (conscious) sedation was employed during this procedure. A
total of Versed 2.0 mg and Fentanyl 100 mcg was administered
intravenously.

Moderate Sedation Time: 10 minutes. The patient's level of
consciousness and vital signs were monitored continuously by
radiology nursing throughout the procedure under my direct
supervision.

FLUOROSCOPY TIME:  CT

COMPLICATIONS:
None

PROCEDURE:
Informed written consent was obtained from the patient after a
thorough discussion of the procedural risks, benefits and
alternatives. All questions were addressed. Maximal Sterile Barrier
Technique was utilized including caps, mask, sterile gowns, sterile
gloves, sterile drape, hand hygiene and skin antiseptic. A timeout
was performed prior to the initiation of the procedure.

Patient position prone position on the CT gantry. Scout CT images
were acquired.

The patient was then prepped and draped in the usual sterile
fashion. 1% lidocaine was used for local anesthesia. Using CT
guidance, guide needle was advanced into the soft tissue mass of the
left lower rib, rib 10. Multiple 18 gauge core biopsy were acquired.

Patient tolerated the procedure well and remained hemodynamically
stable throughout.

No complications were encountered and no significant blood loss.
IMPRESSION: Status post CT-guided biopsy of left tenth rib soft tissue lesion.
Tissue specimen sent to pathology for complete histopathologic
analysis.
